# Patient Record
Sex: Male | Born: 1935 | Hispanic: Yes | Marital: Married | Smoking: Never smoker
Health system: Southern US, Community
[De-identification: ages and names within clinical notes are randomized; demographics above are authoritative.]

## PROBLEM LIST (undated history)

## (undated) DIAGNOSIS — G4700 Insomnia, unspecified: Secondary | ICD-10-CM

## (undated) DIAGNOSIS — C801 Malignant (primary) neoplasm, unspecified: Secondary | ICD-10-CM

## (undated) DIAGNOSIS — M1990 Unspecified osteoarthritis, unspecified site: Secondary | ICD-10-CM

## (undated) DIAGNOSIS — I10 Essential (primary) hypertension: Secondary | ICD-10-CM

## (undated) DIAGNOSIS — I714 Abdominal aortic aneurysm, without rupture, unspecified: Secondary | ICD-10-CM

## (undated) DIAGNOSIS — R0600 Dyspnea, unspecified: Secondary | ICD-10-CM

## (undated) DIAGNOSIS — D649 Anemia, unspecified: Secondary | ICD-10-CM

## (undated) DIAGNOSIS — J45909 Unspecified asthma, uncomplicated: Secondary | ICD-10-CM

## (undated) DIAGNOSIS — N189 Chronic kidney disease, unspecified: Secondary | ICD-10-CM

## (undated) DIAGNOSIS — J449 Chronic obstructive pulmonary disease, unspecified: Secondary | ICD-10-CM

## (undated) DIAGNOSIS — F419 Anxiety disorder, unspecified: Secondary | ICD-10-CM

## (undated) DIAGNOSIS — E119 Type 2 diabetes mellitus without complications: Secondary | ICD-10-CM

## (undated) DIAGNOSIS — F32A Depression, unspecified: Secondary | ICD-10-CM

## (undated) DIAGNOSIS — M549 Dorsalgia, unspecified: Secondary | ICD-10-CM

## (undated) HISTORY — PX: PROSTATE SURGERY: SHX751

## (undated) HISTORY — PX: CARDIAC SURGERY: SHX584

## (undated) HISTORY — DX: Abdominal aortic aneurysm, without rupture, unspecified: I71.40

## (undated) HISTORY — PX: BOWEL RESECTION: SHX1257

---

## 2021-04-24 ENCOUNTER — Emergency Department (HOSPITAL_COMMUNITY): Payer: Medicare Other

## 2021-04-24 ENCOUNTER — Emergency Department (HOSPITAL_COMMUNITY)
Admission: EM | Admit: 2021-04-24 | Discharge: 2021-04-25 | Disposition: A | Discharge status: Home / Self Care | Payer: Medicare Other | Attending: Emergency Medicine | Admitting: Emergency Medicine

## 2021-04-24 ENCOUNTER — Other Ambulatory Visit: Payer: Self-pay

## 2021-04-24 ENCOUNTER — Encounter (HOSPITAL_COMMUNITY): Payer: Self-pay | Admitting: Emergency Medicine

## 2021-04-24 DIAGNOSIS — W182XXA Fall in (into) shower or empty bathtub, initial encounter: Secondary | ICD-10-CM | POA: Diagnosis not present

## 2021-04-24 DIAGNOSIS — I10 Essential (primary) hypertension: Secondary | ICD-10-CM | POA: Diagnosis not present

## 2021-04-24 DIAGNOSIS — Y92002 Bathroom of unspecified non-institutional (private) residence single-family (private) house as the place of occurrence of the external cause: Secondary | ICD-10-CM | POA: Insufficient documentation

## 2021-04-24 DIAGNOSIS — E1165 Type 2 diabetes mellitus with hyperglycemia: Secondary | ICD-10-CM | POA: Insufficient documentation

## 2021-04-24 DIAGNOSIS — R55 Syncope and collapse: Secondary | ICD-10-CM | POA: Diagnosis not present

## 2021-04-24 DIAGNOSIS — M546 Pain in thoracic spine: Secondary | ICD-10-CM | POA: Insufficient documentation

## 2021-04-24 DIAGNOSIS — R739 Hyperglycemia, unspecified: Secondary | ICD-10-CM

## 2021-04-24 HISTORY — DX: Type 2 diabetes mellitus without complications: E11.9

## 2021-04-24 HISTORY — DX: Dorsalgia, unspecified: M54.9

## 2021-04-24 HISTORY — DX: Essential (primary) hypertension: I10

## 2021-04-24 LAB — URINALYSIS, ROUTINE W REFLEX MICROSCOPIC
Bacteria, UA: NONE SEEN
Bilirubin Urine: NEGATIVE
Glucose, UA: >=500 mg/dL — AB
Hgb urine dipstick: NEGATIVE
Ketones, ur: NEGATIVE mg/dL
Leukocytes,Ua: NEGATIVE
Nitrite: NEGATIVE
Protein, ur: NEGATIVE mg/dL
Specific Gravity, Urine: 1.023 (ref 1.005–1.030)
pH: 6 (ref 5.0–8.0)

## 2021-04-24 LAB — TROPONIN I (HIGH SENSITIVITY)
Troponin I (High Sensitivity): 15 ng/L (ref <18)
Troponin I (High Sensitivity): 14 ng/L (ref <18)

## 2021-04-24 LAB — CBC WITH DIFFERENTIAL/PLATELET
Abs Immature Granulocytes: 0.06 10*3/uL (ref 0.00–0.07)
Basophils Absolute: 0 10*3/uL (ref 0.0–0.1)
Basophils Relative: 0 %
Eosinophils Absolute: 0.1 10*3/uL (ref 0.0–0.5)
Eosinophils Relative: 2 %
HCT: 42.1 % (ref 39.0–52.0)
Hemoglobin: 13.8 g/dL (ref 13.0–17.0)
Immature Granulocytes: 1 %
Lymphocytes Relative: 18 %
Lymphs Abs: 1.4 10*3/uL (ref 0.7–4.0)
MCH: 28.2 pg (ref 26.0–34.0)
MCHC: 32.8 g/dL (ref 30.0–36.0)
MCV: 86.1 fL (ref 80.0–100.0)
Monocytes Absolute: 0.4 10*3/uL (ref 0.1–1.0)
Monocytes Relative: 5 %
Neutro Abs: 5.7 10*3/uL (ref 1.7–7.7)
Neutrophils Relative %: 74 %
Platelets: 231 10*3/uL (ref 150–400)
RBC: 4.89 MIL/uL (ref 4.22–5.81)
RDW: 13.2 % (ref 11.5–15.5)
WBC: 7.7 10*3/uL (ref 4.0–10.5)
nRBC: 0 % (ref 0.0–0.2)

## 2021-04-24 LAB — COMPREHENSIVE METABOLIC PANEL
ALT: 15 U/L (ref 0–44)
AST: 11 U/L — ABNORMAL LOW (ref 15–41)
Albumin: 3.2 g/dL — ABNORMAL LOW (ref 3.5–5.0)
Alkaline Phosphatase: 84 U/L (ref 38–126)
Anion gap: 9 (ref 5–15)
BUN: 27 mg/dL — ABNORMAL HIGH (ref 8–23)
CO2: 24 mmol/L (ref 22–32)
Calcium: 8.9 mg/dL (ref 8.9–10.3)
Chloride: 91 mmol/L — ABNORMAL LOW (ref 98–111)
Creatinine, Ser: 1.27 mg/dL — ABNORMAL HIGH (ref 0.61–1.24)
GFR, Estimated: 56 mL/min — ABNORMAL LOW (ref >60)
Glucose, Bld: 687 mg/dL (ref 70–99)
Potassium: 4.8 mmol/L (ref 3.5–5.1)
Sodium: 124 mmol/L — ABNORMAL LOW (ref 135–145)
Total Bilirubin: 1.2 mg/dL (ref 0.3–1.2)
Total Protein: 6.4 g/dL — ABNORMAL LOW (ref 6.5–8.1)

## 2021-04-24 LAB — CBG MONITORING, ED
Glucose-Capillary: 192 mg/dL — ABNORMAL HIGH (ref 70–99)
Glucose-Capillary: 285 mg/dL — ABNORMAL HIGH (ref 70–99)
Glucose-Capillary: 424 mg/dL — ABNORMAL HIGH (ref 70–99)
Glucose-Capillary: 486 mg/dL — ABNORMAL HIGH (ref 70–99)

## 2021-04-24 MED ORDER — SODIUM CHLORIDE 0.9 % IV BOLUS
1000.0000 mL | Freq: Once | INTRAVENOUS | Status: AC
  Administered 2021-04-24: 17:00:00 1000 mL via INTRAVENOUS

## 2021-04-24 MED ORDER — INSULIN ASPART 100 UNIT/ML IJ SOLN
10.0000 [IU] | Freq: Once | INTRAMUSCULAR | Status: AC
  Administered 2021-04-24: 17:00:00 10 [IU] via INTRAVENOUS

## 2021-04-24 MED ORDER — HYDROCODONE-ACETAMINOPHEN 5-325 MG PO TABS: 1.0000 | ORAL_TABLET | ORAL | Status: DC | PRN

## 2021-04-24 MED ORDER — SODIUM CHLORIDE 0.9 % IV BOLUS: 500.0000 mL | Freq: Once | INTRAVENOUS | Status: DC

## 2021-04-24 MED ORDER — ZOLPIDEM TARTRATE 5 MG PO TABS: 5.0000 mg | ORAL_TABLET | Freq: Every evening | ORAL | Status: DC | PRN

## 2021-04-24 MED ORDER — INSULIN ASPART 100 UNIT/ML IJ SOLN
0.0000 [IU] | INTRAMUSCULAR | Status: DC
  Administered 2021-04-24: 3 [IU] via SUBCUTANEOUS
  Administered 2021-04-25: 10:00:00 8 [IU] via SUBCUTANEOUS
  Administered 2021-04-25: 04:00:00 3 [IU] via SUBCUTANEOUS

## 2021-04-24 MED ORDER — INSULIN ASPART 100 UNIT/ML IJ SOLN
15.0000 [IU] | Freq: Once | INTRAMUSCULAR | Status: AC
  Administered 2021-04-24: 18:00:00 15 [IU] via SUBCUTANEOUS

## 2021-04-24 MED ORDER — INSULIN ASPART 100 UNIT/ML IJ SOLN: 10.0000 [IU] | Freq: Once | INTRAMUSCULAR | Status: DC

## 2021-04-24 NOTE — ED Triage Notes (Addendum)
Pt to triage via [facility].  Pt felt dizzy and had a syncopal episode while in the shower.  Hit his back on the shower.  Woke up on the bathroom floor.  C/o thoracic back pain.  EMS- IV- 20g R hand Fentanyl 100 mcg NS 500cc CBG read HIGH

## 2021-04-24 NOTE — ED Provider Notes (Signed)
Emergency Medicine Provider Triage Evaluation Note  Eddie Graham , a 86 y.o. male  was evaluated in triage.  Pt complains of fall onset last night.  Patient was taking shower when he fell and hit his back on the wall.  Has associated dizziness, lightheadedness, back pain. He has not tried medications for his symptoms.  He lives at home alone.  Denies hitting his head, LOC, nausea, vomiting, fever, chills. Has a history of diabetes however he has not been taking his diabetic medications as prescribed.  Review of Systems  Positive: Dizziness, lightheadedness, chest pain Negative: Hitting his head, LOC  Physical Exam  BP (!) 142/71 (BP Location: Left Arm)    Pulse 78    Temp 98.5 F (36.9 C) (Oral)    Resp 14    SpO2 91%  Gen:   Awake, no distress   Resp:  Normal effort  MSK:   Moves extremities without difficulty  Other:  No C, T, L, S spinal tenderness to palpation.   Medical Decision Making  Medically screening exam initiated at 12:43 PM.  Appropriate orders placed.  Eddie Graham was informed that the remainder of the evaluation will be completed by another provider, this initial triage assessment does not replace that evaluation, and the importance of remaining in the ED until their evaluation is complete.     Jamesia Linnen A, PA-C 04/24/21 1326    Wyvonnia Dusky, MD 04/24/21 1425

## 2021-04-24 NOTE — ED Provider Notes (Signed)
[facility] EMERGENCY DEPARTMENT Provider Note   CSN: [id number] Arrival date & time: 04/24/21  1232     History Chief Complaint  Patient presents with   Loss of Consciousness    Eddie Graham is a 86 y.o. male.  Patient presents to the emergency department after a syncopal episode.  Patient was in the shower when the episode occurred.  Patient thinks he fell up against a wall, complains of upper middle and left-sided back pain since the fall.  Patient noted to be hyperglycemic at arrival.  He reports that he is supposed to be taking a pill for his diabetes but he does not take it.      Past Medical History:  Diagnosis Date   Back pain    Diabetes mellitus without complication ([place])    Hypertension     There are no problems to display for this patient.        No family history on file.  Social History   Tobacco Use   Smoking status: Never   Smokeless tobacco: Never  Substance Use Topics   Alcohol use: Not Currently   Drug use: Not Currently    Home Medications Prior to Admission medications   Not on File    Allergies    Patient has no allergy information on record.  Review of Systems   Review of Systems  Musculoskeletal:  Positive for back pain.  Neurological:  Positive for syncope.  All other systems reviewed and are negative.  Physical Exam Updated Vital Signs BP (!) 104/58    Pulse 73    Temp 98.5 F (36.9 C) (Oral)    Resp 18    SpO2 94%   Physical Exam Vitals and nursing note reviewed.  Constitutional:      General: He is not in acute distress.    Appearance: Normal appearance. He is well-developed.  HENT:     Head: Normocephalic and atraumatic.     Right Ear: Hearing normal.     Left Ear: Hearing normal.     Nose: Nose normal.  Eyes:     Conjunctiva/sclera: Conjunctivae normal.     Pupils: Pupils are equal, round, and reactive to light.  Cardiovascular:     Rate and Rhythm: Regular rhythm.     Heart  sounds: S1 normal and S2 normal. No murmur heard.   No friction rub. No gallop.  Pulmonary:     Effort: Pulmonary effort is normal. No respiratory distress.     Breath sounds: Normal breath sounds.  Chest:     Chest wall: No tenderness.  Abdominal:     General: Bowel sounds are normal.     Palpations: Abdomen is soft.     Tenderness: There is no abdominal tenderness. There is no guarding or rebound. Negative signs include Murphy's sign and McBurney's sign.     Hernia: No hernia is present.  Musculoskeletal:        General: Normal range of motion.     Cervical back: Normal range of motion and neck supple.     Thoracic back: Tenderness present.       Back:  Skin:    General: Skin is warm and dry.     Findings: No rash.  Neurological:     Mental Status: He is alert and oriented to person, place, and time.     GCS: GCS eye subscore is 4. GCS verbal subscore is 5. GCS motor subscore is 6.     Cranial Nerves:  No cranial nerve deficit.     Sensory: No sensory deficit.     Coordination: Coordination normal.  Psychiatric:        Speech: Speech normal.        Behavior: Behavior normal.        Thought Content: Thought content normal.    ED Results / Procedures / Treatments   Labs (all labs ordered are listed, but only abnormal results are displayed) Labs Reviewed  COMPREHENSIVE METABOLIC PANEL - Abnormal; Notable for the following components:      Result Value   Sodium 124 (*)    Chloride 91 (*)    Glucose, Bld 687 (*)    BUN 27 (*)    Creatinine, Ser 1.27 (*)    Total Protein 6.4 (*)    Albumin 3.2 (*)    AST 11 (*)    GFR, Estimated 56 (*)    All other components within normal limits  URINALYSIS, ROUTINE W REFLEX MICROSCOPIC - Abnormal; Notable for the following components:   Color, Urine STRAW (*)    Glucose, UA >=500 (*)    All other components within normal limits  CBG MONITORING, ED - Abnormal; Notable for the following components:   Glucose-Capillary 486 (*)    All  other components within normal limits  CBG MONITORING, ED - Abnormal; Notable for the following components:   Glucose-Capillary 424 (*)    All other components within normal limits  CBG MONITORING, ED - Abnormal; Notable for the following components:   Glucose-Capillary 285 (*)    All other components within normal limits  CBC WITH DIFFERENTIAL/PLATELET  TROPONIN I (HIGH SENSITIVITY)  TROPONIN I (HIGH SENSITIVITY)    EKG EKG Interpretation  Date/Time:  Thursday April 24 2021 12:44:03 EST Ventricular Rate:  78 PR Interval:  146 QRS Duration: 78 QT Interval:  390 QTC Calculation: 444 R Axis:   -52 Text Interpretation: Normal sinus rhythm Left axis deviation Anterior infarct , age undetermined ST & T wave abnormality, consider inferior ischemia Abnormal ECG No previous ECGs available Confirmed by Orpah Greek [phone]) on 04/24/2021 8:56:40 PM  Radiology DG Chest 2 View  Result Date: 04/24/2021 CLINICAL DATA:  Chest pain, dizziness, syncope EXAM: CHEST - 2 VIEW COMPARISON:  None FINDINGS: Cardiac size is within normal limits. Thoracic aorta is tortuous and ectatic. There are no focal pulmonary infiltrates. There are no signs of alveolar pulmonary edema. Subtle increase in interstitial markings are noted near the right costophrenic angle. There is no significant pleural effusion or pneumothorax. Metallic sutures seen in the sternum. Deformities seen in few right lower ribs suggesting old fractures. IMPRESSION: There are no signs of pulmonary edema or focal pulmonary consolidation. Subtle increase in interstitial markings near the right lateral costophrenic angle may suggest minimal scarring. There is no pleural effusion or pneumothorax. Electronically Signed   By: Elmer Picker M.D.   On: 04/24/2021 13:31    Procedures Procedures   Medications Ordered in ED Medications  sodium chloride 0.9 % bolus 500 mL (500 mLs Intravenous Not Given 04/24/21 2011)  insulin aspart  (novoLOG) injection 10 Units (10 Units Subcutaneous Not Given 04/24/21 2011)  HYDROcodone-acetaminophen (NORCO/VICODIN) 5-325 MG per tablet 1 tablet (has no administration in time range)  zolpidem (AMBIEN) tablet 5 mg (has no administration in time range)  insulin aspart (novoLOG) injection 0-15 Units (has no administration in time range)  sodium chloride 0.9 % bolus 1,000 mL (0 mLs Intravenous Stopped 04/24/21 1830)  insulin aspart (novoLOG) injection 10  Units (10 Units Intravenous Given 04/24/21 1630)  insulin aspart (novoLOG) injection 15 Units (15 Units Subcutaneous Given 04/24/21 1732)    ED Course  I have reviewed the triage vital signs and the nursing notes.  Pertinent labs & imaging results that were available during my care of the patient were reviewed by me and considered in my medical decision making (see chart for details).    MDM Rules/Calculators/A&P                         Patient presents to the emergency department for evaluation after syncopal episode.  Patient fell while in the shower, hit his upper back on the wall.  Patient complaining of posterior rib pain.  X-ray of chest does not show any abnormality.  He is awake, alert and oriented.  No evidence of head injury.  Patient found to be very hyperglycemic without evidence of DKA.  He is a diabetic but indicates that he has not been taking his medications.  Patient given IV fluids and insulin with correction of his blood sugar.  Discussed with his daughter, Eddie Graham who lives in [place].  She has been unable to get down here to help him because of weather in the [place].  He is currently living alone because his wife had to go to [place] to go to rehab after prolonged illness.  Patient likely not caring for himself well.  Daughter has been trying to get home health through the primary care doctor while he is alone, but has not been able to.  She does indicate that he has some forgetfulness and possible dementia, is not  a candidate to be discharged at this time.  Will require transition of care team to evaluate for possible home health needs.     Final Clinical Impression(s) / ED Diagnoses Final diagnoses:  Hyperglycemia    Rx / DC Orders ED Discharge Orders     None        Orpah Greek, MD 04/24/21 2245

## 2021-04-25 DIAGNOSIS — R55 Syncope and collapse: Secondary | ICD-10-CM | POA: Diagnosis not present

## 2021-04-25 LAB — CBG MONITORING, ED
Glucose-Capillary: 168 mg/dL — ABNORMAL HIGH (ref 70–99)
Glucose-Capillary: 260 mg/dL — ABNORMAL HIGH (ref 70–99)

## 2021-04-25 NOTE — ED Provider Notes (Signed)
Patient was seen by Dr. Betsey Holiday last evening.  Patient was holding in the ED waiting for possible home health and family assistance.  Family is here to take the patient home.  Case management has been involved in his assisting with home health.  Patient's blood sugar has improved with treatment   Dorie Rank, MD 04/25/21 1144

## 2021-04-25 NOTE — Progress Notes (Signed)
Inpatient Diabetes Program Recommendations  AACE/ADA: New Consensus Statement on Inpatient Glycemic Control (2015)  Target Ranges:  Prepandial:   less than 140 mg/dL      Peak postprandial:   less than 180 mg/dL (1-2 hours)      Critically ill patients:  140 - 180 mg/dL   Lab Results  Component Value Date   GLUCAP 260 (H) 04/25/2021    Review of Glycemic Control  Latest Reference Range & Units 04/24/21 18:32 04/24/21 20:09 04/24/21 23:42 04/25/21 03:48 04/25/21 09:26  Glucose-Capillary 70 - 99 mg/dL 424 (H) 285 (H) 192 (H) 168 (H) 260 (H)  (H): Data is abnormally high Diabetes history: Type 2 DM Outpatient Diabetes medications: Rybelsus 7 mg QD (NT), Glipizide 10 mg BID (NT) Current orders for Inpatient glycemic control: Novolog 0-15 units Q4H, Novolog 10- units x 1  Inpatient Diabetes Program Recommendations:    Consider adding Levemir 8 units QD.   Thanks, Bronson Curb, MSN, RNC-OB Diabetes Coordinator [phone] (8a-5p)

## 2021-04-25 NOTE — Discharge Planning (Signed)
Cheo Selvey J. Clydene Laming, RN, BSN, [place] [phone] RNCM consulted regarding discharge planning for Eddie Graham. Offered pt medicare.gov list of home health agencies to choose from.  Pt chose [facility] to render services. RNCM placed referral and is awaiting confirmation of referral acceptance. Patient made aware that [facility] will be in contact in 24-48 hours.  No DME needs identified at this time.

## 2021-04-25 NOTE — ED Notes (Signed)
Pt's daughter sent family friend to pick up pt. Pt discharged home with friend and home health to be set up. Pt informed of need to follow-up with PCP regarding diabetes.

## 2021-07-10 ENCOUNTER — Emergency Department (HOSPITAL_COMMUNITY): Payer: Medicare Other

## 2021-07-10 ENCOUNTER — Emergency Department (HOSPITAL_COMMUNITY)
Admission: EM | Admit: 2021-07-10 | Discharge: 2021-07-10 | Disposition: A | Discharge status: Home / Self Care | Payer: Medicare Other | Attending: Emergency Medicine | Admitting: Emergency Medicine

## 2021-07-10 ENCOUNTER — Encounter (HOSPITAL_COMMUNITY): Payer: Self-pay | Admitting: Oncology

## 2021-07-10 ENCOUNTER — Other Ambulatory Visit: Payer: Self-pay

## 2021-07-10 DIAGNOSIS — S0990XA Unspecified injury of head, initial encounter: Secondary | ICD-10-CM | POA: Diagnosis present

## 2021-07-10 DIAGNOSIS — Y92009 Unspecified place in unspecified non-institutional (private) residence as the place of occurrence of the external cause: Secondary | ICD-10-CM | POA: Insufficient documentation

## 2021-07-10 DIAGNOSIS — N329 Bladder disorder, unspecified: Secondary | ICD-10-CM | POA: Insufficient documentation

## 2021-07-10 DIAGNOSIS — I7143 Infrarenal abdominal aortic aneurysm, without rupture: Secondary | ICD-10-CM

## 2021-07-10 DIAGNOSIS — I10 Essential (primary) hypertension: Secondary | ICD-10-CM | POA: Diagnosis not present

## 2021-07-10 DIAGNOSIS — W19XXXA Unspecified fall, initial encounter: Secondary | ICD-10-CM

## 2021-07-10 DIAGNOSIS — E119 Type 2 diabetes mellitus without complications: Secondary | ICD-10-CM | POA: Diagnosis not present

## 2021-07-10 DIAGNOSIS — M25511 Pain in right shoulder: Secondary | ICD-10-CM

## 2021-07-10 DIAGNOSIS — N3289 Other specified disorders of bladder: Secondary | ICD-10-CM

## 2021-07-10 DIAGNOSIS — S3991XA Unspecified injury of abdomen, initial encounter: Secondary | ICD-10-CM | POA: Insufficient documentation

## 2021-07-10 LAB — COMPREHENSIVE METABOLIC PANEL
ALT: 16 U/L (ref 0–44)
AST: 16 U/L (ref 15–41)
Albumin: 3.5 g/dL (ref 3.5–5.0)
Alkaline Phosphatase: 63 U/L (ref 38–126)
Anion gap: 10 (ref 5–15)
BUN: 42 mg/dL — ABNORMAL HIGH (ref 8–23)
CO2: 27 mmol/L (ref 22–32)
Calcium: 8.8 mg/dL — ABNORMAL LOW (ref 8.9–10.3)
Chloride: 94 mmol/L — ABNORMAL LOW (ref 98–111)
Creatinine, Ser: 1.48 mg/dL — ABNORMAL HIGH (ref 0.61–1.24)
GFR, Estimated: 46 mL/min — ABNORMAL LOW (ref >60)
Glucose, Bld: 324 mg/dL — ABNORMAL HIGH (ref 70–99)
Potassium: 4.6 mmol/L (ref 3.5–5.1)
Sodium: 131 mmol/L — ABNORMAL LOW (ref 135–145)
Total Bilirubin: 0.7 mg/dL (ref 0.3–1.2)
Total Protein: 7.5 g/dL (ref 6.5–8.1)

## 2021-07-10 LAB — URINALYSIS, ROUTINE W REFLEX MICROSCOPIC
Bacteria, UA: NONE SEEN
Bilirubin Urine: NEGATIVE
Glucose, UA: >=500 mg/dL — AB
Hgb urine dipstick: NEGATIVE
Ketones, ur: NEGATIVE mg/dL
Nitrite: NEGATIVE
Protein, ur: NEGATIVE mg/dL
Specific Gravity, Urine: 1.015 (ref 1.005–1.030)
pH: 5 (ref 5.0–8.0)

## 2021-07-10 LAB — CBC WITH DIFFERENTIAL/PLATELET
Abs Immature Granulocytes: 0.04 10*3/uL (ref 0.00–0.07)
Basophils Absolute: 0 10*3/uL (ref 0.0–0.1)
Basophils Relative: 0 %
Eosinophils Absolute: 0.2 10*3/uL (ref 0.0–0.5)
Eosinophils Relative: 2 %
HCT: 47.1 % (ref 39.0–52.0)
Hemoglobin: 15.1 g/dL (ref 13.0–17.0)
Immature Granulocytes: 0 %
Lymphocytes Relative: 23 %
Lymphs Abs: 2.3 10*3/uL (ref 0.7–4.0)
MCH: 27.8 pg (ref 26.0–34.0)
MCHC: 32.1 g/dL (ref 30.0–36.0)
MCV: 86.7 fL (ref 80.0–100.0)
Monocytes Absolute: 0.6 10*3/uL (ref 0.1–1.0)
Monocytes Relative: 6 %
Neutro Abs: 6.8 10*3/uL (ref 1.7–7.7)
Neutrophils Relative %: 69 %
Platelets: 266 10*3/uL (ref 150–400)
RBC: 5.43 MIL/uL (ref 4.22–5.81)
RDW: 13.9 % (ref 11.5–15.5)
WBC: 10 10*3/uL (ref 4.0–10.5)
nRBC: 0 % (ref 0.0–0.2)

## 2021-07-10 MED ORDER — INSULIN ASPART 100 UNIT/ML IJ SOLN
8.0000 [IU] | Freq: Once | INTRAMUSCULAR | Status: AC
  Administered 2021-07-10: 8 [IU] via INTRAVENOUS
  Filled 2021-07-10: qty 0.08

## 2021-07-10 MED ORDER — ACETAMINOPHEN 500 MG PO TABS
1000.0000 mg | ORAL_TABLET | Freq: Once | ORAL | Status: AC
  Administered 2021-07-10: 1000 mg via ORAL
  Filled 2021-07-10: qty 2

## 2021-07-10 MED ORDER — SODIUM CHLORIDE 0.9 % IV BOLUS
500.0000 mL | Freq: Once | INTRAVENOUS | Status: AC
  Administered 2021-07-10: 500 mL via INTRAVENOUS

## 2021-07-10 NOTE — ED Provider Notes (Signed)
? ?Emergency Department Provider Note ? ? ?I have reviewed the triage vital signs and the nursing notes. ? ? ?HISTORY ? ?Chief Complaint ?Fall ? ?Patient declined Spanish interpreter ? ?HPI ?Eddie Graham is a 87 y.o. male with PMH of HTN, DM, and back pain presents to the emergency department evaluation after 2 falls at home today.  Patient tells me that the first fall was related to him slipping on a rug.  Patient does not he was able to get up and does not recall head injury or loss of consciousness.  No presyncope symptoms.  Patient notes that he had a second fall short time after after feeling a bit more weak today.  That caused him to fall on his right side and is having some pain in the right shoulder.  No unilateral weakness or numbness.  No vision or speech change.  No prodrome or presyncope. Patient arrives by EMS. They report elevated CBG with them.  ?  ? ?Past Medical History:  ?Diagnosis Date  ? Back pain   ? Diabetes mellitus without complication ([place])   ? Hypertension   ? ? ?Review of Systems ? ?Constitutional: No fever/chills ?Cardiovascular: Denies chest pain. ?Respiratory: Denies shortness of breath. ?Gastrointestinal: Mild abdominal pain.  No nausea, no vomiting.  No diarrhea.  No constipation. ?Genitourinary: Negative for dysuria. ?Musculoskeletal: Positive lower back and right shoulder pain.  ?Skin: Negative for rash. ?Neurological: Negative for headaches, focal weakness or numbness. ? ? ?____________________________________________ ? ? ?PHYSICAL EXAM: ? ?VITAL SIGNS: ?ED Triage Vitals  ?Enc Vitals Group  ?   BP 07/10/21 1108 136/65  ?   Pulse Rate 07/10/21 1108 79  ?   Resp 07/10/21 1108 16  ?   Temp 07/10/21 1108 97.7 ?F (36.5 ?C)  ?   Temp src --   ?   SpO2 07/10/21 1105 96 %  ? ?Constitutional: Alert and oriented. Well appearing and in no acute distress. ?Eyes: Conjunctivae are normal.  ?Head: Atraumatic. ?Nose: No congestion/rhinnorhea. ?Mouth/Throat: Mucous membranes are  moist.  ?Neck: No stridor. No cervical spine tenderness to palpation. ?Cardiovascular: Normal rate, regular rhythm. Good peripheral circulation. Grossly normal heart sounds.   ?Respiratory: Normal respiratory effort.  No retractions. Lungs CTAB. ?Gastrointestinal: Soft and nontender. No distention.  ?Musculoskeletal: No lower extremity tenderness nor edema. No gross deformities of extremities. Normal ROM of the right shoulder, elbow and wrist. No scaphoid tenderness. Normal ROM of the bilateral LEs.  ?Neurologic:  Normal speech and language. No gross focal neurologic deficits are appreciated.  ?Skin:  Skin is warm, dry and intact. No rash noted. ? ? ?____________________________________________ ?  ?LABS ?(all labs ordered are listed, but only abnormal results are displayed) ? ?Labs Reviewed  ?COMPREHENSIVE METABOLIC PANEL - Abnormal; Notable for the following components:  ?    Result Value  ? Sodium 131 (*)   ? Chloride 94 (*)   ? Glucose, Bld 324 (*)   ? BUN 42 (*)   ? Creatinine, Ser 1.48 (*)   ? Calcium 8.8 (*)   ? GFR, Estimated 46 (*)   ? All other components within normal limits  ?URINALYSIS, ROUTINE W REFLEX MICROSCOPIC - Abnormal; Notable for the following components:  ? Glucose, UA >=500 (*)   ? Leukocytes,Ua TRACE (*)   ? All other components within normal limits  ?CBC WITH DIFFERENTIAL/PLATELET  ? ? ?____________________________________________ ? ? ?PROCEDURES ? ?Procedure(s) performed:  ? ?Procedures ? ?None  ?____________________________________________ ? ? ?INITIAL IMPRESSION / ASSESSMENT AND PLAN /  ED COURSE ? ?Pertinent labs & imaging results that were available during my care of the patient were reviewed by me and considered in my medical decision making (see chart for details). ?  ?This patient is Presenting for Evaluation of fall, which does require a range of treatment options, and is a complaint that involves a high risk of morbidity and mortality. ? ?The Differential Diagnoses includes hip  fracture, dislocation, head injury with bleeding, right shoulder dislocation/fracture, hyperglycemia/DKA leading to weakness. ? ?Critical Interventions-  ?  ?Medications  ?sodium chloride 0.9 % bolus 500 mL (0 mLs Intravenous Stopped 07/10/21 1434)  ?acetaminophen (TYLENOL) tablet 1,000 mg (1,000 mg Oral Given 07/10/21 1319)  ?insulin aspart (novoLOG) injection 8 Units (8 Units Intravenous Given 07/10/21 1443)  ? ? ?Reassessment after intervention:  Feeling well and ambulatory in the ED.  ? ? ?I did obtain Additional Historical Information from EMS. No pain medication given PTA. Spoke with patient's daughter, [name]. Patient lives alone. Family is trying to arrange for the patient to move back to [place].  ? ?  ?Clinical Laboratory Tests Ordered, included hyperglycemia without DKA. No leukocytosis.  ? ?Radiologic Tests Ordered, included CT head, c-spine, and abdomen/pelvis along with plain films of chest, and shoulder. I independently interpreted the images and agree with radiology interpretation.  ? ?Cardiac Monitor Tracing which shows NSR ? ? ?Social Determinants of Health Risk patient is a non-smoker.  ? ? ?Medical Decision Making: Summary:  ?Patient presents emergency department for evaluation after fall.  He is describing some generalized weakness today with hyperglycemia.  Will need labs to rule out DKA or other metabolic issue.  Patient's neuro exam is intact.  Do not find evidence of focal neurodeficit to suspect stroke.  No outward sign of head injury.  Given multiple falls, however, do plan for imaging including of the head, cervical spine, abdomen along with plain films.  ? ?Reevaluation with update and discussion with patient and daughter by phone. Case mgmt to assist with [place] orders. Plan for discharge. Patient ambulatory in the ED. CT findings discussed with patient and daughter. Follow up contact info given.  ? ?Disposition: discharge ? ?____________________________________________ ? ?FINAL CLINICAL  IMPRESSION(S) / ED DIAGNOSES ? ?Final diagnoses:  ?Fall, initial encounter  ?Acute pain of right shoulder  ?Bladder mass  ?Infrarenal abdominal aortic aneurysm (AAA) without rupture  ? ? ? ? ?Note:  This document was prepared using Dragon voice recognition software and may include unintentional dictation errors. ? ?Nanda Quinton, MD, FACEP ?Emergency Medicine ? ?  ?Margette Fast, MD ?07/14/21 1130 ? ?

## 2021-07-10 NOTE — Progress Notes (Signed)
.  Transition of Care [facility]) - Emergency Department Mini Assessment ? ? ?Patient Details  ?Name: Eddie Graham ?MRN: [id number] ?Date of Birth: 06/30/1934 ? ?Transition of Care (TOC) CM/SW Contact:    ?Arlie Solomons Madilynne Mullan, LCSW ?Phone Number: ?07/10/2021, 3:25 PM ? ? ?Clinical Narrative: ?CSW spoke with pt's daughter [name], she stated pt is set up with [facility] service. She stated she has been working to get pt back to [place]. Per pt's daughter pt's [facility] nurse will come back to the home on Monday to check on his medication. Per pt's daughter, her main concern was transportation back home. CSW informed pt's daughter the hospital can provide transportation upon d/c. CSW to contact Safe transport for [employer]. ?CSW attempted to contact Safe transport and left VM requesting a return call. Toc to follow.  ? ? ? ?ED Mini Assessment: ?What brought you to the Emergency Department? : fall ? ?Barriers to Discharge: Continued Medical Work up ? ?  ? ?Means of departure: Hospital Transport ? ?Interventions which prevented an admission or readmission: Transportation Screening ? ? ? ?Patient Contact and Communications ?  ?  ?  ? ,     ?  ?  ? ?  ?CMS Medicare.gov Compare Post Acute Care list provided to:: Patient ?  ? ?Admission diagnosis:  Fall ?There are no problems to display for this patient. ? ?PCP:  Default, Provider, MD ?Pharmacy:   ?[facility], [address] ?[place] [address] ?Phone: [phone] Fax: [phone] ?  ?

## 2021-07-10 NOTE — ED Triage Notes (Signed)
Pt bib [facility] from home s/p fall.  Pt had 2 falls PTA. 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  Pt did strike right shoulder on table w/ second fall.  Pt denies hitting head, LOC.  Pt had elevated CBG for EMS of 460 states he had taken his insulin and had just ate.   ?

## 2021-07-10 NOTE — ED Notes (Signed)
Pt ambulated well to bathroom and down the hallway. ?

## 2021-07-10 NOTE — Discharge Instructions (Signed)
You were seen in the emergency room today after a fall.  Your CT scans showed the findings as we discussed.  Please follow with the urology and vascular surgery team.  I have listed their contact information above and discussed this with your daughter.  Our case manager will be trying to arrange home health.  Please follow closely with your primary care doctor. ?

## 2021-07-10 NOTE — Progress Notes (Signed)
Safe transport was set up for door to door pick up. ETA 50 mins.  ? ?Arlie Solomons.Graylin Sperling, MSW, [place] ?[place]  Transitions of Care ?Clinical Social Worker I ?Direct Dial: [phone]  Fax: [phone] ?[email or web address]   ?

## 2021-08-05 ENCOUNTER — Encounter: Payer: Self-pay | Admitting: Vascular Surgery

## 2021-08-05 ENCOUNTER — Ambulatory Visit (INDEPENDENT_AMBULATORY_CARE_PROVIDER_SITE_OTHER): Payer: Medicare Other | Admitting: Vascular Surgery

## 2021-08-05 DIAGNOSIS — I714 Abdominal aortic aneurysm, without rupture, unspecified: Secondary | ICD-10-CM | POA: Insufficient documentation

## 2021-08-05 DIAGNOSIS — I7143 Infrarenal abdominal aortic aneurysm, without rupture: Secondary | ICD-10-CM

## 2021-08-05 NOTE — Progress Notes (Signed)
? ? ?Patient name: Eddie Graham MRN: [id number] DOB: 12/24/1934 Sex: male ? ?REASON FOR CONSULT: Abdominal aortic aneurysm ? ?HPI: ?Eddie Graham is a 87 y.o. male, with history of hypertension, diabetes, chronic back pain that presents for evaluation of abdominal aortic aneurysm.  He was seen in the ED following a fall at home and had a CT on 07/10/2021.  This demonstrated a 3.2 cm saccular aneurysm on the right side of the distal abdominal aorta per the radiology report.  He states he had no prior knowledge of this aneurysm.  He has chronic back pain but no abdominal pain.  He does smoke daily.  Lives independently.  Previous abdominal surgery includes prostate surgery. ? ?Past Medical History:  ?Diagnosis Date  ? AAA (abdominal aortic aneurysm) ([place])   ? Back pain   ? Diabetes mellitus without complication ([place])   ? Hypertension   ? ? ?Past Surgical History:  ?Procedure Laterality Date  ? CARDIAC SURGERY    ? PROSTATE SURGERY    ? ? ?History reviewed. No pertinent family history. ? ?SOCIAL HISTORY: ?Social History  ? ?Socioeconomic History  ? Marital status: Married  ?  Spouse name: Not on file  ? Number of children: Not on file  ? Years of education: Not on file  ? Highest education level: Not on file  ?Occupational History  ? Not on file  ?Tobacco Use  ? Smoking status: Never  ? Smokeless tobacco: Never  ?Vaping Use  ? Vaping Use: Never used  ?Substance and Sexual Activity  ? Alcohol use: Not Currently  ? Drug use: Not Currently  ? Sexual activity: Not on file  ?Other Topics Concern  ? Not on file  ?Social History Narrative  ? Not on file  ? ?Social Determinants of Health  ? ?Financial Resource Strain: Not on file  ?Food Insecurity: Not on file  ?Transportation Needs: Not on file  ?Physical Activity: Not on file  ?Stress: Not on file  ?Social Connections: Not on file  ?Intimate Partner Violence: Not on file  ? ? ?No Known Allergies ? ?Current Outpatient Medications  ?Medication Sig  Dispense Refill  ? albuterol (VENTOLIN HFA) 108 (90 Base) MCG/ACT inhaler Inhale 2 puffs into the lungs every 4 (four) hours as needed for wheezing.    ? clopidogrel (PLAVIX) 75 MG tablet Take 75 mg by mouth daily.    ? DULoxetine (CYMBALTA) 60 MG capsule Take 60 mg by mouth daily.    ? gabapentin (NEURONTIN) 100 MG capsule Take 100 mg by mouth 3 (three) times daily.    ? glipiZIDE (GLUCOTROL) 10 MG tablet Take 10 mg by mouth 2 (two) times daily.    ? metoprolol succinate (TOPROL-XL) 25 MG 24 hr tablet Take 25 mg by mouth daily.    ? montelukast (SINGULAIR) 10 MG tablet Take 10 mg by mouth daily.    ? pantoprazole (PROTONIX) 40 MG tablet Take 40 mg by mouth daily.    ? rosuvastatin (CRESTOR) 40 MG tablet Take 40 mg by mouth daily.    ? RYBELSUS 7 MG TABS Take 7 mg by mouth daily.    ? traZODone (DESYREL) 50 MG tablet Take 50 mg by mouth at bedtime.    ? valsartan (DIOVAN) 80 MG tablet Take 80 mg by mouth daily.    ? ?No current facility-administered medications for this visit.  ? ? ?REVIEW OF SYSTEMS:  ?'[X]'$  denotes positive finding, '[ ]'$  denotes negative finding ?Cardiac  Comments:  ?Chest pain or chest pressure:    ?  Shortness of breath upon exertion:    ?Short of breath when lying flat:    ?Irregular heart rhythm:    ?    ?Vascular    ?Pain in calf, thigh, or hip brought on by ambulation:    ?Pain in feet at night that wakes you up from your sleep:     ?Blood clot in your veins:    ?Leg swelling:     ?    ?Pulmonary    ?Oxygen at home:    ?Productive cough:     ?Wheezing:     ?    ?Neurologic    ?Sudden weakness in arms or legs:     ?Sudden numbness in arms or legs:     ?Sudden onset of difficulty speaking or slurred speech:    ?Temporary loss of vision in one eye:     ?Problems with dizziness:     ?    ?Gastrointestinal    ?Blood in stool:     ?Vomited blood:     ?    ?Genitourinary    ?Burning when urinating:     ?Blood in urine:    ?    ?Psychiatric    ?Major depression:     ?    ?Hematologic    ?Bleeding  problems:    ?Problems with blood clotting too easily:    ?    ?Skin    ?Rashes or ulcers:    ?    ?Constitutional    ?Fever or chills:    ? ? ?PHYSICAL EXAM: ?Vitals:  ? 08/05/21 0852  ?BP: 122/76  ?Pulse: 78  ?Resp: 20  ?Temp: 98.4 ?F (36.9 ?C)  ?SpO2: 94%  ?Weight: 177 lb (80.3 kg)  ?Height: '5\' 6"'$  (1.676 m)  ? ? ?GENERAL: The patient is a well-nourished male, in no acute distress. The vital signs are documented above. ?CARDIAC: There is a regular rate and rhythm.  ?VASCULAR:  ?Palpable femoral pulses bilaterally ?Palpable PT pulses bilaterally ?PULMONARY: No respiratory distress ?ABDOMEN: Soft and non-tender.  No pain with deep palpation. ?MUSCULOSKELETAL: There are no major deformities or cyanosis. ?NEUROLOGIC: No focal weakness or paresthesias are detected. ?SKIN: There are no ulcers or rashes noted. ?PSYCHIATRIC: The patient has a normal affect. ? ?DATA:  ? ?CT reviewed from 07/10/21 abdomen pelvis without contrast. ? ? ? ? ?Assessment/Plan: ? ?87 year old male presents for evaluation of abdominal aortic aneurysm that was discovered incidentally following a CT in the [facility] after he presented with blunt trauma from a fall.  Discussed that his CT scan shows this saccular aneurysm off the right side of the abdominal aorta measuring a maximal diameter of about 3.4 cm and his native aorta measures about 1.5 cm adjacent to the aneurysm.  I did discuss that saccular aneurysms have a higher risk morphology for rupture compared to traditional fusiform aneurysms.  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 I have recommended a CTA abdomen pelvis for further evaluation of options to see if we can potentially do a cuff here and also to evaluate his iliac access given he has fairly calcified iliacs.  Discussed the other option would be continued surveillance.  I will see him back once CTA is complete.  All questions answered. ? ? ?Marty Heck, MD ?[facility] ?Office: [phone] ? ? ? ? ?

## 2021-08-18 ENCOUNTER — Other Ambulatory Visit: Payer: Self-pay

## 2021-08-18 DIAGNOSIS — I7143 Infrarenal abdominal aortic aneurysm, without rupture: Secondary | ICD-10-CM

## 2021-08-25 ENCOUNTER — Inpatient Hospital Stay: Admission: RE | Admit: 2021-08-25 | Payer: Medicaid Other | Source: Ambulatory Visit

## 2021-08-27 ENCOUNTER — Other Ambulatory Visit: Payer: Self-pay

## 2021-08-27 DIAGNOSIS — I7143 Infrarenal abdominal aortic aneurysm, without rupture: Secondary | ICD-10-CM

## 2021-08-29 ENCOUNTER — Telehealth: Payer: Self-pay | Admitting: Licensed Clinical Social Worker

## 2021-08-29 NOTE — Progress Notes (Signed)
?Heart and Vascular Care Navigation ? ?08/29/2021 ? ?Eddie Graham ?[birth year] ?[id number] ? ?Reason for Referral:  ?Engaged with patient by telephone for initial visit for Heart and Vascular Care Coordination. ?                                                                                                  ?Assessment:        ?LCSW called pt with the assistance of [facility] [employer] [phone]. Introduced self, role, reason for call. Pt lives in [place] but his daughters do not, he indicates [place] lives in [place]. He states a family member lives nearby but then didn't elaborate- he has had challenges with transportation. LCSW shared that pt has a ride arranged for Monday to get to his appt at [facility]. He was told what time it may arrive. LCSW encourage him to be ready by 8am and keep his eyes out. When we started to discuss further transportation benefits pt had a hard time understanding what we were asking of him. I inquired if I could speak with his daughter Eddie Graham. He gives verbal approval.  ? ?LCSW called pt daughter at [phone], left voicemail for her which was returned. Pt daughter speaks [place], I introduced myself, role, reason for call. Pt daughter shares that pt has two daughters and nobody lives in [place] but pt has lived down here for about 2 decades. Pt daughter explained the situation with transportation and upcoming appointments. I verbally shared that pt eligible for rides through [facility] and [facility] Medicare. Pt daughter requests I e-mail those to her, which I have to [email or web address]. Pt daughter will assist with future transportation- she is aware of time for cab on Monday for pt to get to [facility].  ? ? ?HRT/VAS Care Coordination   ? ? Patients Home Cardiology Office [facility]  ? Outpatient Care Team Social Worker  ? Social Worker Name: Eddie Graham [phone]  ? Living arrangements for the past 2 months Apartment  ? Lives with: Self  ? Patient Current  Therapist, music Medicare; Medicaid  ? Patient Has Concern With Paying Medical Bills No  ? Does Patient Have Prescription Coverage? Yes  ? ?  ? ? ?Social History:                                                                             ?SDOH Screenings  ? ?Alcohol Screen: Not on file  ?Depression (PHQ2-9): Not on file  ?Financial Resource Strain: Low Risk   ? Difficulty of Paying Living Expenses: Not very hard  ?Food Insecurity: No Food Insecurity  ? Worried About Charity fundraiser in the Last Year: Never true  ? Ran Out of Food in the Last Year: Never true  ?Housing: Low Risk   ? Last Housing Risk  Score: 0  ?Physical Activity: Not on file  ?Social Connections: Not on file  ?Stress: Not on file  ?Tobacco Use: Low Risk   ? Smoking Tobacco Use: Never  ? Smokeless Tobacco Use: Never  ? Passive Exposure: Not on file  ?Transportation Needs: Unmet Transportation Needs  ? Lack of Transportation (Medical): Yes  ? Lack of Transportation (Non-Medical): Yes  ? ? ?SDOH Interventions: ?Financial Resources:  Financial Strain Interventions: Intervention Not Indicated ?  ?Housing Insecurity:  Housing Interventions: Intervention Not Indicated  ?Transportation:   Transportation Interventions: Payor Benefit  ? ? ? ?Follow-up plan:   ?LCSW has sent transportation both via mail (in [place]) to pt and via email to pt daughter. Both have my number for questions/concerns. I have spoke with Eddie Graham in scheduling at [facility] to let him know this as well. I remain available as needed, will not actively follow at this time.  ? ? ? ?

## 2021-09-01 ENCOUNTER — Emergency Department (HOSPITAL_COMMUNITY)
Admission: RE | Admit: 2021-09-01 | Discharge: 2021-09-01 | Disposition: A | Discharge status: Home / Self Care | Payer: Medicare Other | Source: Ambulatory Visit | Attending: Vascular Surgery | Admitting: Vascular Surgery

## 2021-09-01 ENCOUNTER — Encounter (HOSPITAL_COMMUNITY): Payer: Self-pay | Admitting: Emergency Medicine

## 2021-09-01 ENCOUNTER — Emergency Department (HOSPITAL_COMMUNITY)
Admission: EM | Admit: 2021-09-01 | Discharge: 2021-09-01 | Disposition: A | Discharge status: Home / Self Care | Payer: Medicare Other | Attending: Emergency Medicine | Admitting: Emergency Medicine

## 2021-09-01 DIAGNOSIS — Z79899 Other long term (current) drug therapy: Secondary | ICD-10-CM | POA: Insufficient documentation

## 2021-09-01 DIAGNOSIS — E119 Type 2 diabetes mellitus without complications: Secondary | ICD-10-CM | POA: Diagnosis not present

## 2021-09-01 DIAGNOSIS — R109 Unspecified abdominal pain: Secondary | ICD-10-CM | POA: Diagnosis present

## 2021-09-01 DIAGNOSIS — I7143 Infrarenal abdominal aortic aneurysm, without rupture: Secondary | ICD-10-CM

## 2021-09-01 DIAGNOSIS — Z7902 Long term (current) use of antithrombotics/antiplatelets: Secondary | ICD-10-CM | POA: Insufficient documentation

## 2021-09-01 DIAGNOSIS — Z7984 Long term (current) use of oral hypoglycemic drugs: Secondary | ICD-10-CM | POA: Insufficient documentation

## 2021-09-01 DIAGNOSIS — I10 Essential (primary) hypertension: Secondary | ICD-10-CM | POA: Diagnosis not present

## 2021-09-01 DIAGNOSIS — R1084 Generalized abdominal pain: Secondary | ICD-10-CM | POA: Diagnosis not present

## 2021-09-01 LAB — I-STAT CREATININE, ED: Creatinine, Ser: 1.7 mg/dL — ABNORMAL HIGH (ref 0.61–1.24)

## 2021-09-01 MED ORDER — IOHEXOL 350 MG/ML SOLN
100.0000 mL | Freq: Once | INTRAVENOUS | Status: AC | PRN
Start: 2021-09-01 — End: 2021-09-01
  Administered 2021-09-01: 100 mL via INTRAVENOUS

## 2021-09-01 NOTE — ED Provider Notes (Signed)
?[facility] ?Provider Note ? ? ?CSN: [id number] ?Arrival date & time: 09/01/21  0805 ? ?  ? ?History ?PMH: Past medical history of AAA, diabetes, hypertension ?Chief Complaint  ?Patient presents with  ? Abdominal Pain  ? ?An interpreter was used during this encounter. ?Eddie Graham is a 87 y.o. male.  Patient presents the emergency department for a scheduled CTA of the abdomen and pelvis.  He states that he has had abdominal pain for about 6 years.  It was supposed to be getting image today, but somehow ended up to at the emergency department.  He says that the symptoms have not gotten any worse or better.  He does not feel like he has an emergency at this time.  Patient denies any nausea, vomiting, diarrhea, lightheadedness, syncope, visual disturbance, chest pain, or shortness of breath. ? ? ?Abdominal Pain ? ?  ? ?Home Medications ?Prior to Admission medications   ?Medication Sig Start Date End Date Taking? Authorizing Provider  ?albuterol (VENTOLIN HFA) 108 (90 Base) MCG/ACT inhaler Inhale 2 puffs into the lungs every 4 (four) hours as needed for wheezing. 11/11/20   [provider]  ?clopidogrel (PLAVIX) 75 MG tablet Take 75 mg by mouth daily. 01/24/21   [provider]  ?DULoxetine (CYMBALTA) 60 MG capsule Take 60 mg by mouth daily. 02/07/21   [provider]  ?gabapentin (NEURONTIN) 100 MG capsule Take 100 mg by mouth 3 (three) times daily. 02/25/21   [provider]  ?glipiZIDE (GLUCOTROL) 10 MG tablet Take 10 mg by mouth 2 (two) times daily. 03/25/21   [provider]  ?metoprolol succinate (TOPROL-XL) 25 MG 24 hr tablet Take 25 mg by mouth daily. 04/07/21   [provider]  ?montelukast (SINGULAIR) 10 MG tablet Take 10 mg by mouth daily. 03/19/21   [provider]  ?pantoprazole (PROTONIX) 40 MG tablet Take 40 mg by mouth daily. 02/14/21   [provider]  ?rosuvastatin (CRESTOR) 40 MG  tablet Take 40 mg by mouth daily. 04/07/21   [provider]  ?RYBELSUS 7 MG TABS Take 7 mg by mouth daily. 03/14/21   [provider]  ?traZODone (DESYREL) 50 MG tablet Take 50 mg by mouth at bedtime. 03/14/21   [provider]  ?valsartan (DIOVAN) 80 MG tablet Take 80 mg by mouth daily. 03/14/21   [provider]  ?   ? ?Allergies    ?Patient has no known allergies.   ? ?Review of Systems   ?Review of Systems  ?Gastrointestinal:  Positive for abdominal pain.  ?All other systems reviewed and are negative. ? ?Physical Exam ?Updated Vital Signs ?BP 125/66   Pulse 71   Temp 97.6 ?F (36.4 ?C)   Resp 18   Ht '5\' 6"'$  (1.676 m)   Wt 80 kg   SpO2 96%   BMI 28.47 kg/m?  ?Physical Exam ?Vitals and nursing note reviewed.  ?Constitutional:   ?   General: He is not in acute distress. ?   Appearance: Normal appearance. He is well-developed. He is not ill-appearing, toxic-appearing or diaphoretic.  ?HENT:  ?   Head: Normocephalic and atraumatic.  ?   Nose: No nasal deformity.  ?   Mouth/Throat:  ?   Lips: Pink. No lesions.  ?Eyes:  ?   General: Gaze aligned appropriately. No scleral icterus.    ?   Right eye: No discharge.     ?   Left eye: No discharge.  ?  Conjunctiva/sclera: Conjunctivae normal.  ?   Right eye: Right conjunctiva is not injected. No exudate or hemorrhage. ?   Left eye: Left conjunctiva is not injected. No exudate or hemorrhage. ?Pulmonary:  ?   Effort: Pulmonary effort is normal. No respiratory distress.  ?Abdominal:  ?   General: Abdomen is flat.  ?   Palpations: Abdomen is soft.  ?   Tenderness: There is generalized abdominal tenderness. There is no right CVA tenderness, left CVA tenderness, guarding or rebound. Negative signs include Murphy's sign, Rovsing's sign and McBurney's sign.  ?   Hernia: No hernia is present.  ?   Comments: Mild generalized tenderness  ?Skin: ?   General: Skin is warm and dry.  ?Neurological:  ?   Mental Status: He is alert and oriented to  person, place, and time.  ?Psychiatric:     ?   Mood and Affect: Mood normal.     ?   Speech: Speech normal.     ?   Behavior: Behavior normal. Behavior is cooperative.  ? ? ?ED Results / Procedures / Treatments   ?Labs ?(all labs ordered are listed, but only abnormal results are displayed) ?Labs Reviewed - No data to display ? ?EKG ?None ? ?Radiology ?No results found. ? ?Procedures ?Procedures  ? ?Medications Ordered in ED ?Medications - No data to display ? ?ED Course/ Medical Decision Making/ A&P ?  ?                        ?Medical Decision Making ? ?She is presenting to the emergency department to get a CTA of his abdomen and pelvis.  You, he was seen by vascular surgery on April 11.  He was found to have an incidental abdominal aortic aneurysm after a trauma in March 2023.  It was recommended that he get an outpatient CT abdomen and pelvis.  He is scheduled for this today.  Looks like the appointment was at 9 AM.  I did discuss whether patient presented here for an emergency or if he was simply just trying to get to his CT scan.  After further inquiry, it looks like he has had no new problems and does not feel he has an emergency at this time.  He wants to try to go to his scheduled appointment.  He has a benign abdomen on exam.  His vitals are stable.  I think that he is stable to go over to the outpatient radiology department.  I personally walked him over there and helped him check in. ? ?This is a supervised visit with my attending physician, Dr. Francia Greaves.  We have discussed the plan of care of this patient and he has changed the plan as needed. ? ?Final Clinical Impression(s) / ED Diagnoses ?Final diagnoses:  ?Generalized abdominal pain  ? ? ?Rx / DC Orders ?ED Discharge Orders   ? ? None  ? ?  ? ? ?  ?Adolphus Birchwood, PA-C ?09/01/21 4132 ? ?  ?Valarie Merino, MD ?09/02/21 1310 ? ?

## 2021-09-01 NOTE — ED Triage Notes (Signed)
Pt states his stomach has been hurting him for "years and years." Pt refuses to answer how bad the pain is. States he does not want to talk about it.  ?

## 2021-09-01 NOTE — Discharge Instructions (Signed)
Please go to [facility] Radiology CT Imaging Department for your scheduled appointment. ?

## 2021-09-02 ENCOUNTER — Ambulatory Visit: Payer: Medicaid Other | Admitting: Vascular Surgery

## 2021-09-09 ENCOUNTER — Encounter: Payer: Self-pay | Admitting: Vascular Surgery

## 2021-09-09 ENCOUNTER — Ambulatory Visit (INDEPENDENT_AMBULATORY_CARE_PROVIDER_SITE_OTHER): Payer: Medicare Other | Admitting: Vascular Surgery

## 2021-09-09 VITALS — BP 103/72 | HR 72 | Temp 97.6°F | Resp 18 | Ht 66.0 in | Wt 180.0 lb

## 2021-09-09 DIAGNOSIS — I7143 Infrarenal abdominal aortic aneurysm, without rupture: Secondary | ICD-10-CM | POA: Diagnosis not present

## 2021-09-09 NOTE — Progress Notes (Signed)
? ? ?Patient name: Eddie Graham MRN: [id number] DOB: Feb 26, 1935 Sex: male ? ?REASON FOR CONSULT: Follow-up after CTA for evaluation of saccular abdominal aortic aneurysm ? ?HPI: ?Eddie Graham is a 87 y.o. male, with history of hypertension, diabetes, chronic back pain that presents for follow-up after CTA for evaluation of a saccular abdominal aortic aneurysm.  He was seen in the ED following a fall at home and had a CT abdomen w/o contrast on 07/10/2021.  This demonstrated a 3.2 cm saccular aneurysm on the right side of the distal abdominal aorta per the radiology report.  He had no prior knowledge of this aneurysm.  He has chronic back pain but no abdominal pain.  He does smoke daily.  Lives independently.  Previous abdominal surgery includes prostate surgery.  Ultimately I sent him for CTA for further evaluation.  Today he states his whole body hurts. ? ?Past Medical History:  ?Diagnosis Date  ? AAA (abdominal aortic aneurysm) ([place])   ? Back pain   ? Diabetes mellitus without complication ([place])   ? Hypertension   ? ? ?Past Surgical History:  ?Procedure Laterality Date  ? CARDIAC SURGERY    ? PROSTATE SURGERY    ? ? ?History reviewed. No pertinent family history. ? ?SOCIAL HISTORY: ?Social History  ? ?Socioeconomic History  ? Marital status: Married  ?  Spouse name: Not on file  ? Number of children: Not on file  ? Years of education: Not on file  ? Highest education level: Not on file  ?Occupational History  ? Not on file  ?Tobacco Use  ? Smoking status: Never  ? Smokeless tobacco: Never  ?Vaping Use  ? Vaping Use: Never used  ?Substance and Sexual Activity  ? Alcohol use: Not Currently  ? Drug use: Not Currently  ? Sexual activity: Not on file  ?Other Topics Concern  ? Not on file  ?Social History Narrative  ? Not on file  ? ?Social Determinants of Health  ? ?Financial Resource Strain: Low Risk   ? Difficulty of Paying Living Expenses: Not very hard  ?Food Insecurity: No Food Insecurity   ? Worried About Charity fundraiser in the Last Year: Never true  ? Ran Out of Food in the Last Year: Never true  ?Transportation Needs: Unmet Transportation Needs  ? Lack of Transportation (Medical): Yes  ? Lack of Transportation (Non-Medical): Yes  ?Physical Activity: Not on file  ?Stress: Not on file  ?Social Connections: Not on file  ?Intimate Partner Violence: Not on file  ? ? ?No Known Allergies ? ?Current Outpatient Medications  ?Medication Sig Dispense Refill  ? albuterol (VENTOLIN HFA) 108 (90 Base) MCG/ACT inhaler Inhale 2 puffs into the lungs every 4 (four) hours as needed for wheezing.    ? clopidogrel (PLAVIX) 75 MG tablet Take 75 mg by mouth daily.    ? DULoxetine (CYMBALTA) 60 MG capsule Take 60 mg by mouth daily.    ? gabapentin (NEURONTIN) 100 MG capsule Take 100 mg by mouth 3 (three) times daily.    ? glipiZIDE (GLUCOTROL) 10 MG tablet Take 10 mg by mouth 2 (two) times daily.    ? metoprolol succinate (TOPROL-XL) 25 MG 24 hr tablet Take 25 mg by mouth daily.    ? montelukast (SINGULAIR) 10 MG tablet Take 10 mg by mouth daily.    ? pantoprazole (PROTONIX) 40 MG tablet Take 40 mg by mouth daily.    ? rosuvastatin (CRESTOR) 40 MG tablet Take 40 mg by mouth  daily.    ? RYBELSUS 7 MG TABS Take 7 mg by mouth daily.    ? traZODone (DESYREL) 50 MG tablet Take 50 mg by mouth at bedtime.    ? valsartan (DIOVAN) 80 MG tablet Take 80 mg by mouth daily.    ? ?No current facility-administered medications for this visit.  ? ? ?REVIEW OF SYSTEMS:  ?'[X]'$  denotes positive finding, '[ ]'$  denotes negative finding ?Cardiac  Comments:  ?Chest pain or chest pressure:    ?Shortness of breath upon exertion:    ?Short of breath when lying flat:    ?Irregular heart rhythm:    ?    ?Vascular    ?Pain in calf, thigh, or hip brought on by ambulation:    ?Pain in feet at night that wakes you up from your sleep:     ?Blood clot in your veins:    ?Leg swelling:     ?    ?Pulmonary    ?Oxygen at home:    ?Productive cough:      ?Wheezing:     ?    ?Neurologic    ?Sudden weakness in arms or legs:     ?Sudden numbness in arms or legs:     ?Sudden onset of difficulty speaking or slurred speech:    ?Temporary loss of vision in one eye:     ?Problems with dizziness:     ?    ?Gastrointestinal    ?Blood in stool:     ?Vomited blood:     ?    ?Genitourinary    ?Burning when urinating:     ?Blood in urine:    ?    ?Psychiatric    ?Major depression:     ?    ?Hematologic    ?Bleeding problems:    ?Problems with blood clotting too easily:    ?    ?Skin    ?Rashes or ulcers:    ?    ?Constitutional    ?Fever or chills:    ? ? ?PHYSICAL EXAM: ?Vitals:  ? 09/09/21 1023  ?BP: 103/72  ?Pulse: 72  ?Resp: 18  ?Temp: 97.6 ?F (36.4 ?C)  ?TempSrc: Temporal  ?SpO2: 90%  ?Weight: 180 lb (81.6 kg)  ?Height: '5\' 6"'$  (1.676 m)  ? ? ?GENERAL: The patient is a well-nourished male, in no acute distress. The vital signs are documented above. ?CARDIAC: There is a regular rate and rhythm.  ?VASCULAR:  ?Palpable femoral pulses bilaterally ?Palpable PT pulses bilaterally ?PULMONARY: No respiratory distress ?ABDOMEN: Soft and non-tender.  No pain with deep palpation. ?MUSCULOSKELETAL: There are no major deformities or cyanosis. ?NEUROLOGIC: No focal weakness or paresthesias are detected. ?SKIN: There are no ulcers or rashes noted. ?PSYCHIATRIC: The patient has a normal affect. ? ?DATA:  ? ?CTA reviewed from 09/01/21  ? ? ? ? ? ?Assessment/Plan: ? ?87 year old male presents for follow-up after CTA for further evaluation of a suspected saccular abdominal aortic aneurysm that was discovered incidentally following a CT w/o contrast in the [place] ED after he presented with blunt trauma from a fall.  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  I would be comfortable following this for now.  I will see him in 6 months with CT abdomen pelvis without contrast.  Maximal diameter of this aneurysmal segment is 3.3  cm as pictured above.  Discussed if interval change in the future may ultimately require stent grafting for  treatment. 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  He states he already has a urologist and has an appointment. ? ?Marty Heck, MD ?[facility] ?Office: [phone] ? ? ? ? ?

## 2021-09-12 ENCOUNTER — Other Ambulatory Visit: Payer: Self-pay | Admitting: Urology

## 2021-09-24 NOTE — Patient Instructions (Signed)
DUE TO COVID-19 ONLY TWO VISITORS  (aged 85 and older)  IS ALLOWED TO COME WITH YOU AND STAY IN THE WAITING ROOM ONLY DURING PRE OP AND PROCEDURE.   **NO VISITORS ARE ALLOWED IN THE SHORT STAY AREA OR RECOVERY ROOM!!**  IYou are not required to quarantine Hand Hygiene often Do NOT share personal items Notify your provider if you are in close contact with someone who has COVID or you develop fever 100.4 or greater, new onset of sneezing, cough, sore throat, shortness of breath or body aches.        Your procedure is scheduled on:  September 30, 2021   Report to [facility] Main Entrance    Report to admitting at 05:45 AM   Call this number if you have problems the morning of surgery [phone]   Do not eat food :After Midnight.   After Midnight you may have the following liquids until 0500 AM DAY OF SURGERY  Clear Liquid Diet Water Black Coffee (sugar ok, NO MILK/CREAM OR CREAMERS)  Tea (sugar ok, NO MILK/CREAM OR CREAMERS) regular and decaf                             Plain Jell-O (NO RED)                                           Fruit ices (not with fruit pulp, NO RED)                                     Popsicles (NO RED)                                                                  Juice: apple, WHITE grape, WHITE cranberry Sports drinks like Gatorade (NO RED) Clear broth(vegetable,chicken,beef)          If you have questions, please contact your surgeon's office.            FOLLOW ANY ADDITIONAL PRE OP INSTRUCTIONS YOU RECEIVED FROM YOUR SURGEON'S OFFICE!!!   Oral Hygiene is also important to reduce your risk of infection.                                    Remember - BRUSH YOUR TEETH THE MORNING OF SURGERY WITH YOUR REGULAR TOOTHPASTE   Do NOT smoke after Midnight   Take these medicines the morning of surgery with A SIP OF WATER: CYMBALTA, NEURONTIN, METOPROLOL, SINGULAIR, PROTONIX, CRESTOR                          OK to use your ALBUTEROL INHALER if needed  the morning of surgery  How to Manage Your Diabetes Before and After Surgery  Why is it important to control my blood sugar before and after surgery? Improving blood sugar levels before and after surgery helps healing and can limit problems. A way of improving blood sugar  control is eating a healthy diet by:  Eating less sugar and carbohydrates  Increasing activity/exercise  Talking with your doctor about reaching your blood sugar goals High blood sugars (greater than 180 mg/dL) can raise your risk of infections and slow your recovery, so you will need to focus on controlling your diabetes during the weeks before surgery. Make sure that the doctor who takes care of your diabetes knows about your planned surgery including the date and location.  How do I manage my blood sugar before surgery? Check your blood sugar at least 4 times a day, starting 2 days before surgery, to make sure that the level is not too high or low. Check your blood sugar the morning of your surgery when you wake up and every 2 hours until you get to the Short Stay unit. If your blood sugar is less than 70 mg/dL, you will need to treat for low blood sugar: Do not take insulin. Treat a low blood sugar (less than 70 mg/dL) with  cup of clear juice (cranberry or apple), 4 glucose tablets, OR glucose gel. Recheck blood sugar in 15 minutes after treatment (to make sure it is greater than 70 mg/dL). If your blood sugar is not greater than 70 mg/dL on recheck, call [phone] for further instructions. Report your blood sugar to the short stay nurse when you get to Short Stay.  If you are admitted to the hospital after surgery: Your blood sugar will be checked by the staff and you will probably be given insulin after surgery (instead of oral diabetes medicines) to make sure you have good blood sugar levels. The goal for blood sugar control after surgery is 80-180 mg/dL.   WHAT DO I DO ABOUT MY DIABETES MEDICATION?  Do not  take oral diabetes medicines (pills) the morning of surgery.  THE DAY BEFORE SURGERY: take morning dose of GLIPIZIDE, Do not take evening dose of GLIPIZIDE.                                                             Take RYBELSUS as prescribed.   THE MORNING OF SURGERY: DO NOT TAKE GLIPIZIDE OR RYBELSUS  Reviewed and Endorsed by [facility] Patient Education Committee, August 2015              You may not have any metal on your body including jewelry, and body piercing             Do not wear make-up, lotions, powders, cologne, or deodorant              Men may shave face and neck.   Do not bring valuables to the hospital. [place].   Contacts, dentures or bridgework may not be worn into surgery.   Patients discharged on the day of surgery will not be allowed to drive home.  Someone NEEDS to stay with you for the first 24 hours after anesthesia.  Special Instructions: Bring a copy of your healthcare power of attorney and living will documents the day of surgery if you haven't scanned them before.  Please read over the following fact sheets you were given: IF [facility] [place] - Preparing for Surgery  Before surgery, you can play an important role.  Because skin is not sterile, your skin needs to be as free of germs as possible.  You can reduce the number of germs on your skin by washing with CHG (chlorahexidine gluconate) soap before surgery.  CHG is an antiseptic cleaner which kills germs and bonds with the skin to continue killing germs even after washing. Please DO NOT use if you have an allergy to CHG or antibacterial soaps.  If your skin becomes reddened/irritated stop using the CHG and inform your nurse when you arrive at Short Stay. Do not shave (including legs and underarms) for at least 48 hours prior to the first CHG shower.  You may shave your face/neck.  Please follow  these instructions carefully:  1.  Shower with CHG Soap the night before surgery and the  morning of surgery.  2.  If you choose to wash your hair, wash your hair first as usual with your normal  shampoo.  3.  After you shampoo, rinse your hair and body thoroughly to remove the shampoo.                             4.  Use CHG as you would any other liquid soap.  You can apply chg directly to the skin and wash.  Gently with a scrungie or clean washcloth.  5.  Apply the CHG Soap to your body ONLY FROM THE NECK DOWN.   Do   not use on face/ open                           Wound or open sores. Avoid contact with eyes, ears mouth and   genitals (private parts).                       Wash face,  Genitals (private parts) with your normal soap.             6.  Wash thoroughly, paying special attention to the area where your    surgery  will be performed.  7.  Thoroughly rinse your body with warm water from the neck down.  8.  DO NOT shower/wash with your normal soap after using and rinsing off the CHG Soap.                9.  Pat yourself dry with a clean towel.            10.  Wear clean pajamas.            11.  Place clean sheets on your bed the night of your first shower and do not  sleep with pets. Day of Surgery : Do not apply any lotions/deodorants the morning of surgery.  Please wear clean clothes to the hospital/surgery center.  FAILURE TO FOLLOW THESE INSTRUCTIONS MAY RESULT IN THE CANCELLATION OF YOUR SURGERY  PATIENT SIGNATURE_________________________________  NURSE SIGNATURE__________________________________  ________________________________________________________________________

## 2021-09-24 NOTE — Progress Notes (Signed)
COVID Vaccine Completed:  Date of COVID positive in last 90 days:  PCP - Dr. Yong Channel   CEW Cardiologist -   Chest x-ray - 07-10-21 EPIC EKG - 04-24-21 EPIC Stress Test -  ECHO -  Cardiac Cath -  Pacemaker/ICD device last checked: Spinal Cord Stimulator:  Bowel Prep -   Sleep Study -  CPAP -   Fasting Blood Sugar -  Checks Blood Sugar _____ times a day  Blood Thinner Instructions: Plavix Aspirin Instructions: Last Dose:  Activity level:  Can go up a flight of stairs and perform activities of daily living without stopping and without symptoms of chest pain or shortness of breath.   Able to exercise without symptoms  Unable to go up a flight of stairs without symptoms of      Anesthesia review: Hx AAA ([facility] sees), DM, HTN, COPD, CKD  Patient denies shortness of breath, fever, cough and chest pain at PAT appointment   Patient verbalized understanding of instructions that were given to them at the PAT appointment. Patient was also instructed that they will need to review over the PAT instructions again at home before surgery.

## 2021-09-25 ENCOUNTER — Other Ambulatory Visit: Payer: Self-pay

## 2021-09-25 ENCOUNTER — Encounter (HOSPITAL_COMMUNITY)
Admission: RE | Admit: 2021-09-25 | Discharge: 2021-09-25 | Disposition: A | Discharge status: Home / Self Care | Payer: Medicare Other | Source: Ambulatory Visit | Attending: Urology | Admitting: Urology

## 2021-09-25 ENCOUNTER — Encounter (HOSPITAL_COMMUNITY): Payer: Self-pay

## 2021-09-25 VITALS — BP 101/62 | HR 75 | Temp 97.8°F | Resp 20 | Ht 66.0 in | Wt 174.2 lb

## 2021-09-25 DIAGNOSIS — N183 Chronic kidney disease, stage 3 unspecified: Secondary | ICD-10-CM | POA: Diagnosis not present

## 2021-09-25 DIAGNOSIS — E1122 Type 2 diabetes mellitus with diabetic chronic kidney disease: Secondary | ICD-10-CM | POA: Diagnosis not present

## 2021-09-25 DIAGNOSIS — Z01812 Encounter for preprocedural laboratory examination: Secondary | ICD-10-CM | POA: Insufficient documentation

## 2021-09-25 DIAGNOSIS — K5730 Diverticulosis of large intestine without perforation or abscess without bleeding: Secondary | ICD-10-CM | POA: Insufficient documentation

## 2021-09-25 DIAGNOSIS — E119 Type 2 diabetes mellitus without complications: Secondary | ICD-10-CM

## 2021-09-25 DIAGNOSIS — N35919 Unspecified urethral stricture, male, unspecified site: Secondary | ICD-10-CM | POA: Diagnosis not present

## 2021-09-25 DIAGNOSIS — I129 Hypertensive chronic kidney disease with stage 1 through stage 4 chronic kidney disease, or unspecified chronic kidney disease: Secondary | ICD-10-CM | POA: Insufficient documentation

## 2021-09-25 DIAGNOSIS — D494 Neoplasm of unspecified behavior of bladder: Secondary | ICD-10-CM | POA: Diagnosis not present

## 2021-09-25 DIAGNOSIS — I7143 Infrarenal abdominal aortic aneurysm, without rupture: Secondary | ICD-10-CM | POA: Diagnosis not present

## 2021-09-25 DIAGNOSIS — D649 Anemia, unspecified: Secondary | ICD-10-CM

## 2021-09-25 DIAGNOSIS — J449 Chronic obstructive pulmonary disease, unspecified: Secondary | ICD-10-CM | POA: Insufficient documentation

## 2021-09-25 DIAGNOSIS — Z01818 Encounter for other preprocedural examination: Secondary | ICD-10-CM

## 2021-09-25 HISTORY — DX: Depression, unspecified: F32.A

## 2021-09-25 HISTORY — DX: Chronic kidney disease, unspecified: N18.9

## 2021-09-25 HISTORY — DX: Unspecified osteoarthritis, unspecified site: M19.90

## 2021-09-25 HISTORY — DX: Insomnia, unspecified: G47.00

## 2021-09-25 HISTORY — DX: Chronic obstructive pulmonary disease, unspecified: J44.9

## 2021-09-25 HISTORY — DX: Anemia, unspecified: D64.9

## 2021-09-25 HISTORY — DX: Anxiety disorder, unspecified: F41.9

## 2021-09-25 HISTORY — DX: Dyspnea, unspecified: R06.00

## 2021-09-25 LAB — BASIC METABOLIC PANEL
Anion gap: 9 (ref 5–15)
BUN: 32 mg/dL — ABNORMAL HIGH (ref 8–23)
CO2: 26 mmol/L (ref 22–32)
Calcium: 9.1 mg/dL (ref 8.9–10.3)
Chloride: 101 mmol/L (ref 98–111)
Creatinine, Ser: 1.49 mg/dL — ABNORMAL HIGH (ref 0.61–1.24)
GFR, Estimated: 46 mL/min — ABNORMAL LOW (ref >60)
Glucose, Bld: 363 mg/dL — ABNORMAL HIGH (ref 70–99)
Potassium: 4.5 mmol/L (ref 3.5–5.1)
Sodium: 136 mmol/L (ref 135–145)

## 2021-09-25 LAB — GLUCOSE, CAPILLARY: Glucose-Capillary: 365 mg/dL — ABNORMAL HIGH (ref 70–99)

## 2021-09-25 LAB — CBC
HCT: 46.4 % (ref 39.0–52.0)
Hemoglobin: 15.3 g/dL (ref 13.0–17.0)
MCH: 28.4 pg (ref 26.0–34.0)
MCHC: 33 g/dL (ref 30.0–36.0)
MCV: 86.1 fL (ref 80.0–100.0)
Platelets: 234 10*3/uL (ref 150–400)
RBC: 5.39 MIL/uL (ref 4.22–5.81)
RDW: 13.8 % (ref 11.5–15.5)
WBC: 9.4 10*3/uL (ref 4.0–10.5)
nRBC: 0 % (ref 0.0–0.2)

## 2021-09-26 NOTE — Anesthesia Preprocedure Evaluation (Addendum)
Anesthesia Evaluation  Patient identified by MRN, date of birth, ID band Patient awake    Reviewed: Allergy & Precautions, NPO status , Patient's Chart, lab work & pertinent test results  History of Anesthesia Complications Negative for: history of anesthetic complications  Airway Mallampati: III  TM Distance: >3 FB Neck ROM: Full    Dental  (+) Poor Dentition, Dental Advisory Given   Pulmonary COPD,    Pulmonary exam normal        Cardiovascular hypertension, Pt. on home beta blockers Normal cardiovascular exam     Neuro/Psych PSYCHIATRIC DISORDERS Anxiety Depression negative neurological ROS     GI/Hepatic negative GI ROS, Neg liver ROS,   Endo/Other  diabetes, Poorly Controlled  Renal/GU CRFRenal disease     Musculoskeletal negative musculoskeletal ROS (+)   Abdominal   Peds  Hematology negative hematology ROS (+)   Anesthesia Other Findings   Reproductive/Obstetrics                           Anesthesia Physical Anesthesia Plan  ASA: 3  Anesthesia Plan: General   Post-op Pain Management: Tylenol PO (pre-op)*   Induction:   PONV Risk Score and Plan: 2 and Ondansetron and Diphenhydramine  Airway Management Planned: LMA and Oral ETT  Additional Equipment:   Intra-op Plan:   Post-operative Plan: Extubation in OR  Informed Consent: I have reviewed the patients History and Physical, chart, labs and discussed the procedure including the risks, benefits and alternatives for the proposed anesthesia with the patient or authorized representative who has indicated his/her understanding and acceptance.     Dental advisory given  Plan Discussed with: CRNA and Anesthesiologist  Anesthesia Plan Comments: (See PAT note 09/25/21)       Anesthesia Quick Evaluation

## 2021-09-26 NOTE — Progress Notes (Signed)
Anesthesia Chart Review   Case: [id number] Date/Time: 09/30/21 0745   Procedures:      TRANSURETHRAL RESECTION OF BLADDER TUMOR (TURBT) - 26 MINS     CYSTOSCOPY WITH  URETHRAL BALLOON  DILATATION   Anesthesia type: General   Pre-op diagnosis: URETHRAL STRICTURE, BLADDER TUMOR   Location: [facility] PROCEDURE [facility] / [facility]   Surgeons: Robley Fries, MD       DISCUSSION:87 y.o. never smoker with h/o HTN, DM II, CKD Stage III, COPD, urethral stricture, bladder t umor scheduled for above procedure 09/30/2021 with Dr. Jacalyn Lefevre.   Pt seen by vascular surgeon for evaluation of saccular abdominal aortic aneurysm.  Maximal diameter of this aneurysmal segment is 3.3 cm. Per OV note will follow, no intervention at this time.  6 month visit with CT scheduled.   Pt last seen by PCP 08/18/2021. COPD controlled at this time.  At this visit A1C 12.0, which is down from >18.0 on 04/30/21.  Non-fasting blood glucose at PAT visit 363.  Evaluate DOS. Ris of cancellation discussed.    VS: BP 101/62   Pulse 75   Temp 36.6 C (Oral)   Resp 20   Ht '5\' 6"'$  (1.676 m)   Wt 79 kg   SpO2 97%   BMI 28.11 kg/m   PROVIDERS: Default, Provider, MD  Varney Biles, MD is PCP  Monica Martinez, MD is Vascular Surgeon LABS:  Elevated blood glucose, evaluate DOS (all labs ordered are listed, but only abnormal results are displayed)  Labs Reviewed  BASIC METABOLIC PANEL - Abnormal; Notable for the following components:      Result Value   Glucose, Bld 363 (*)    BUN 32 (*)    Creatinine, Ser 1.49 (*)    GFR, Estimated 46 (*)    All other components within normal limits  GLUCOSE, CAPILLARY - Abnormal; Notable for the following components:   Glucose-Capillary 365 (*)    All other components within normal limits  CBC     IMAGES: CT Abdomen 09/01/2021 IMPRESSION: 1. 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 Guidelines recommend referral to a vascular specialist (the patient is  currently under the care of a vascular specialist/surgeon). 2. 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 3. 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 If not previously obtained, recommend urologic consultation. 4. Colonic diverticulosis    EKG: 04/25/2021 Rate 78 bpm  NSR LAD Anterior infarct, age undetermined ST & T wave abnormality consider inferior ischemia  CV:  Past Medical History:  Diagnosis Date   AAA (abdominal aortic aneurysm) (HCC)    Anemia    Anxiety    Arthritis    Back pain    Chronic kidney disease    COPD (chronic obstructive pulmonary disease) (HCC)    Depression    Diabetes mellitus without complication ([place])    Dyspnea    Hypertension    Insomnia     Past Surgical History:  Procedure Laterality Date   CARDIAC SURGERY     PROSTATE SURGERY      MEDICATIONS:  albuterol (VENTOLIN HFA) 108 (90 Base) MCG/ACT inhaler   clopidogrel (PLAVIX) 75 MG tablet   DULoxetine (CYMBALTA) 60 MG capsule   gabapentin (NEURONTIN) 100 MG capsule   glipiZIDE (GLUCOTROL) 10 MG tablet   LANTUS SOLOSTAR 100 UNIT/ML Solostar Pen   metoprolol succinate (TOPROL-XL) 25 MG 24 hr tablet   montelukast (SINGULAIR) 10 MG tablet   pantoprazole (PROTONIX)  40 MG tablet   rosuvastatin (CRESTOR) 40 MG tablet   Semaglutide 14 MG TABS   traZODone (DESYREL) 50 MG tablet   valsartan (DIOVAN) 80 MG tablet   No current facility-administered medications for this encounter.     Konrad Felix Ward, PA-C [facility] Pre-Surgical Testing [phone]

## 2021-09-30 ENCOUNTER — Ambulatory Visit (HOSPITAL_COMMUNITY): Payer: Medicare Other

## 2021-09-30 ENCOUNTER — Encounter (HOSPITAL_COMMUNITY): Payer: Self-pay | Admitting: Urology

## 2021-09-30 ENCOUNTER — Other Ambulatory Visit: Payer: Self-pay

## 2021-09-30 ENCOUNTER — Encounter (HOSPITAL_COMMUNITY)
Admission: RE | Disposition: A | Discharge status: Home / Self Care | Payer: Self-pay | Source: Home / Self Care | Attending: Urology

## 2021-09-30 ENCOUNTER — Ambulatory Visit (HOSPITAL_BASED_OUTPATIENT_CLINIC_OR_DEPARTMENT_OTHER): Payer: Medicare Other | Admitting: Anesthesiology

## 2021-09-30 ENCOUNTER — Observation Stay (HOSPITAL_COMMUNITY)
Admission: RE | Admit: 2021-09-30 | Discharge: 2021-10-01 | Disposition: A | Discharge status: Home / Self Care | Payer: Medicare Other | Attending: Urology | Admitting: Urology

## 2021-09-30 ENCOUNTER — Ambulatory Visit (HOSPITAL_COMMUNITY): Payer: Medicare Other | Admitting: Physician Assistant

## 2021-09-30 DIAGNOSIS — C679 Malignant neoplasm of bladder, unspecified: Secondary | ICD-10-CM | POA: Diagnosis not present

## 2021-09-30 DIAGNOSIS — Z7902 Long term (current) use of antithrombotics/antiplatelets: Secondary | ICD-10-CM | POA: Insufficient documentation

## 2021-09-30 DIAGNOSIS — I129 Hypertensive chronic kidney disease with stage 1 through stage 4 chronic kidney disease, or unspecified chronic kidney disease: Secondary | ICD-10-CM | POA: Diagnosis not present

## 2021-09-30 DIAGNOSIS — E1122 Type 2 diabetes mellitus with diabetic chronic kidney disease: Secondary | ICD-10-CM

## 2021-09-30 DIAGNOSIS — N189 Chronic kidney disease, unspecified: Secondary | ICD-10-CM | POA: Diagnosis not present

## 2021-09-30 DIAGNOSIS — N35011 Post-traumatic bulbous urethral stricture: Secondary | ICD-10-CM | POA: Diagnosis not present

## 2021-09-30 DIAGNOSIS — D494 Neoplasm of unspecified behavior of bladder: Secondary | ICD-10-CM

## 2021-09-30 DIAGNOSIS — D419 Neoplasm of uncertain behavior of unspecified urinary organ: Secondary | ICD-10-CM | POA: Diagnosis present

## 2021-09-30 DIAGNOSIS — Z79899 Other long term (current) drug therapy: Secondary | ICD-10-CM | POA: Insufficient documentation

## 2021-09-30 DIAGNOSIS — Z794 Long term (current) use of insulin: Secondary | ICD-10-CM | POA: Diagnosis not present

## 2021-09-30 DIAGNOSIS — E119 Type 2 diabetes mellitus without complications: Secondary | ICD-10-CM | POA: Diagnosis not present

## 2021-09-30 DIAGNOSIS — I10 Essential (primary) hypertension: Secondary | ICD-10-CM | POA: Diagnosis not present

## 2021-09-30 DIAGNOSIS — N3289 Other specified disorders of bladder: Secondary | ICD-10-CM | POA: Diagnosis present

## 2021-09-30 DIAGNOSIS — J45909 Unspecified asthma, uncomplicated: Secondary | ICD-10-CM | POA: Diagnosis not present

## 2021-09-30 HISTORY — PX: TRANSURETHRAL RESECTION OF BLADDER TUMOR: SHX2575

## 2021-09-30 HISTORY — PX: CYSTOSCOPY WITH URETHRAL DILATATION: SHX5125

## 2021-09-30 LAB — HEMOGLOBIN A1C
Hgb A1c MFr Bld: 13.2 % — ABNORMAL HIGH (ref 4.8–5.6)
Mean Plasma Glucose: 332.14 mg/dL

## 2021-09-30 LAB — GLUCOSE, CAPILLARY
Glucose-Capillary: 206 mg/dL — ABNORMAL HIGH (ref 70–99)
Glucose-Capillary: 215 mg/dL — ABNORMAL HIGH (ref 70–99)
Glucose-Capillary: 350 mg/dL — ABNORMAL HIGH (ref 70–99)
Glucose-Capillary: 396 mg/dL — ABNORMAL HIGH (ref 70–99)

## 2021-09-30 SURGERY — TURBT (TRANSURETHRAL RESECTION OF BLADDER TUMOR)
Anesthesia: General | Site: Urethra

## 2021-09-30 MED ORDER — ONDANSETRON HCL 4 MG/2ML IJ SOLN
INTRAMUSCULAR | Status: DC | PRN
  Administered 2021-09-30: 4 mg via INTRAVENOUS

## 2021-09-30 MED ORDER — LACTATED RINGERS IV SOLN: INTRAVENOUS | Status: DC

## 2021-09-30 MED ORDER — LIDOCAINE HCL (PF) 2 % IJ SOLN
INTRAMUSCULAR | Status: AC
  Filled 2021-09-30: qty 5

## 2021-09-30 MED ORDER — DIPHENHYDRAMINE HCL 50 MG/ML IJ SOLN
INTRAMUSCULAR | Status: AC
Start: 2021-09-30 — End: ?
  Filled 2021-09-30: qty 1

## 2021-09-30 MED ORDER — PROPOFOL 10 MG/ML IV BOLUS
INTRAVENOUS | Status: DC | PRN
  Administered 2021-09-30: 160 mg via INTRAVENOUS

## 2021-09-30 MED ORDER — INSULIN ASPART 100 UNIT/ML IJ SOLN
0.0000 [IU] | Freq: Three times a day (TID) | INTRAMUSCULAR | Status: DC
  Administered 2021-09-30 – 2021-10-01 (×2): 11 [IU] via SUBCUTANEOUS
  Administered 2021-10-01: 15 [IU] via SUBCUTANEOUS

## 2021-09-30 MED ORDER — ROCURONIUM BROMIDE 10 MG/ML (PF) SYRINGE
PREFILLED_SYRINGE | INTRAVENOUS | Status: AC
  Filled 2021-09-30: qty 10

## 2021-09-30 MED ORDER — HYDROCODONE-ACETAMINOPHEN 5-325 MG PO TABS: 1.0000 | ORAL_TABLET | ORAL | 0 refills | Status: DC | PRN

## 2021-09-30 MED ORDER — ACETAMINOPHEN 500 MG PO TABS
1000.0000 mg | ORAL_TABLET | Freq: Once | ORAL | Status: AC
  Administered 2021-09-30: 1000 mg via ORAL
  Filled 2021-09-30: qty 2

## 2021-09-30 MED ORDER — INSULIN ASPART 100 UNIT/ML IJ SOLN
0.0000 [IU] | Freq: Every day | INTRAMUSCULAR | Status: DC
  Administered 2021-09-30: 5 [IU] via SUBCUTANEOUS

## 2021-09-30 MED ORDER — GLIPIZIDE 10 MG PO TABS
10.0000 mg | ORAL_TABLET | Freq: Two times a day (BID) | ORAL | Status: DC
  Administered 2021-09-30 – 2021-10-01 (×2): 10 mg via ORAL
  Filled 2021-09-30 (×2): qty 1

## 2021-09-30 MED ORDER — PROPOFOL 10 MG/ML IV BOLUS
INTRAVENOUS | Status: AC
  Filled 2021-09-30: qty 20

## 2021-09-30 MED ORDER — FENTANYL CITRATE PF 50 MCG/ML IJ SOSY
PREFILLED_SYRINGE | INTRAMUSCULAR | Status: AC
  Filled 2021-09-30: qty 1

## 2021-09-30 MED ORDER — EPHEDRINE SULFATE-NACL 50-0.9 MG/10ML-% IV SOSY
PREFILLED_SYRINGE | INTRAVENOUS | Status: DC | PRN
  Administered 2021-09-30: 5 mg via INTRAVENOUS

## 2021-09-30 MED ORDER — ROSUVASTATIN CALCIUM 20 MG PO TABS
40.0000 mg | ORAL_TABLET | Freq: Every day | ORAL | Status: DC
  Administered 2021-10-01: 40 mg via ORAL
  Filled 2021-09-30: qty 2

## 2021-09-30 MED ORDER — MONTELUKAST SODIUM 10 MG PO TABS
10.0000 mg | ORAL_TABLET | Freq: Every day | ORAL | Status: DC
  Administered 2021-10-01: 10 mg via ORAL
  Filled 2021-09-30: qty 1

## 2021-09-30 MED ORDER — 0.9 % SODIUM CHLORIDE (POUR BTL) OPTIME
TOPICAL | Status: DC | PRN
  Administered 2021-09-30: 1000 mL

## 2021-09-30 MED ORDER — ONDANSETRON HCL 4 MG/2ML IJ SOLN
INTRAMUSCULAR | Status: AC
  Filled 2021-09-30: qty 2

## 2021-09-30 MED ORDER — GABAPENTIN 100 MG PO CAPS
100.0000 mg | ORAL_CAPSULE | Freq: Three times a day (TID) | ORAL | Status: DC
Start: 2021-09-30 — End: 2021-10-01
  Administered 2021-09-30 – 2021-10-01 (×4): 100 mg via ORAL
  Filled 2021-09-30 (×4): qty 1

## 2021-09-30 MED ORDER — IRBESARTAN 75 MG PO TABS
75.0000 mg | ORAL_TABLET | Freq: Every day | ORAL | Status: DC
  Administered 2021-09-30: 75 mg via ORAL
  Filled 2021-09-30 (×2): qty 1

## 2021-09-30 MED ORDER — LIDOCAINE 2% (20 MG/ML) 5 ML SYRINGE
INTRAMUSCULAR | Status: DC | PRN
  Administered 2021-09-30: 80 mg via INTRAVENOUS

## 2021-09-30 MED ORDER — TRAZODONE HCL 50 MG PO TABS: 50.0000 mg | ORAL_TABLET | Freq: Every evening | ORAL | Status: DC | PRN

## 2021-09-30 MED ORDER — CEFAZOLIN SODIUM-DEXTROSE 2-4 GM/100ML-% IV SOLN
2.0000 g | INTRAVENOUS | Status: AC
  Administered 2021-09-30: 2 g via INTRAVENOUS
  Filled 2021-09-30: qty 100

## 2021-09-30 MED ORDER — FENTANYL CITRATE (PF) 250 MCG/5ML IJ SOLN
INTRAMUSCULAR | Status: DC | PRN
  Administered 2021-09-30 (×2): 25 ug via INTRAVENOUS
  Administered 2021-09-30: 50 ug via INTRAVENOUS

## 2021-09-30 MED ORDER — DIPHENHYDRAMINE HCL 50 MG/ML IJ SOLN
INTRAMUSCULAR | Status: DC | PRN
  Administered 2021-09-30: 12.5 mg via INTRAVENOUS

## 2021-09-30 MED ORDER — FENTANYL CITRATE PF 50 MCG/ML IJ SOSY
25.0000 ug | PREFILLED_SYRINGE | INTRAMUSCULAR | Status: DC | PRN
  Administered 2021-09-30 (×2): 25 ug via INTRAVENOUS

## 2021-09-30 MED ORDER — CEPHALEXIN 250 MG PO CAPS: 250.0000 mg | ORAL_CAPSULE | Freq: Two times a day (BID) | ORAL | 0 refills | Status: AC

## 2021-09-30 MED ORDER — SEMAGLUTIDE 14 MG PO TABS: 14.0000 mg | ORAL_TABLET | Freq: Every day | ORAL | Status: DC

## 2021-09-30 MED ORDER — PROMETHAZINE HCL 25 MG/ML IJ SOLN: 6.2500 mg | INTRAMUSCULAR | Status: DC | PRN

## 2021-09-30 MED ORDER — CHLORHEXIDINE GLUCONATE 0.12 % MT SOLN
15.0000 mL | Freq: Once | OROMUCOSAL | Status: AC
  Administered 2021-09-30: 15 mL via OROMUCOSAL

## 2021-09-30 MED ORDER — DEXAMETHASONE SODIUM PHOSPHATE 10 MG/ML IJ SOLN
INTRAMUSCULAR | Status: AC
  Filled 2021-09-30: qty 1

## 2021-09-30 MED ORDER — ORAL CARE MOUTH RINSE: 15.0000 mL | Freq: Once | OROMUCOSAL | Status: AC

## 2021-09-30 MED ORDER — SODIUM CHLORIDE 0.9 % IR SOLN
Status: DC | PRN
  Administered 2021-09-30 (×4): 6000 mL

## 2021-09-30 MED ORDER — AMISULPRIDE (ANTIEMETIC) 5 MG/2ML IV SOLN: 10.0000 mg | Freq: Once | INTRAVENOUS | Status: DC | PRN

## 2021-09-30 MED ORDER — DEXAMETHASONE SODIUM PHOSPHATE 10 MG/ML IJ SOLN
INTRAMUSCULAR | Status: DC | PRN
  Administered 2021-09-30: 8 mg via INTRAVENOUS

## 2021-09-30 MED ORDER — METOPROLOL SUCCINATE ER 25 MG PO TB24
25.0000 mg | ORAL_TABLET | Freq: Every day | ORAL | Status: DC
  Administered 2021-10-01: 25 mg via ORAL
  Filled 2021-09-30: qty 1

## 2021-09-30 MED ORDER — FENTANYL CITRATE (PF) 100 MCG/2ML IJ SOLN
INTRAMUSCULAR | Status: AC
  Filled 2021-09-30: qty 2

## 2021-09-30 MED ORDER — ROCURONIUM BROMIDE 10 MG/ML (PF) SYRINGE
PREFILLED_SYRINGE | INTRAVENOUS | Status: DC | PRN
  Administered 2021-09-30: 60 mg via INTRAVENOUS

## 2021-09-30 MED ORDER — PANTOPRAZOLE SODIUM 40 MG PO TBEC
40.0000 mg | DELAYED_RELEASE_TABLET | Freq: Every day | ORAL | Status: DC
  Administered 2021-10-01: 40 mg via ORAL
  Filled 2021-09-30: qty 1

## 2021-09-30 MED ORDER — ALBUTEROL SULFATE (2.5 MG/3ML) 0.083% IN NEBU: 3.0000 mL | INHALATION_SOLUTION | RESPIRATORY_TRACT | Status: DC | PRN

## 2021-09-30 MED ORDER — DULOXETINE HCL 60 MG PO CPEP
60.0000 mg | ORAL_CAPSULE | Freq: Every day | ORAL | Status: DC
  Administered 2021-10-01: 60 mg via ORAL
  Filled 2021-09-30: qty 1

## 2021-09-30 SURGICAL SUPPLY — 34 items
BAG URINE DRAIN 2000ML AR STRL (UROLOGICAL SUPPLIES) ×3 IMPLANT
BAG URO CATCHER STRL LF (MISCELLANEOUS) ×3 IMPLANT
BALLN NEPHROSTOMY (BALLOONS)
BALLN OPTILUME DCB 30X5X75 (BALLOONS) ×3
BALLOON NEPHROSTOMY (BALLOONS) IMPLANT
BALLOON OPTILUME DCB 30X5X75 (BALLOONS) IMPLANT
CATH COUDE 22FR 5CC (CATHETERS) IMPLANT
CATH FOLEY 2W COUNCIL 20FR 5CC (CATHETERS) IMPLANT
CATH RIBBED COUDE 30CC (CATHETERS) ×1
CATH ROBINSON RED A/P 14FR (CATHETERS) ×2 IMPLANT
CATH URET 5FR 28IN CONE TIP (BALLOONS)
CATH URET 5FR 70CM CONE TIP (BALLOONS) IMPLANT
CATH URETL OPEN END 6FR 70 (CATHETERS) IMPLANT
CLOTH BEACON ORANGE TIMEOUT ST (SAFETY) ×3 IMPLANT
DEVICE INFLATION ATRION QL4015 (MISCELLANEOUS) ×1 IMPLANT
DRAPE FOOT SWITCH (DRAPES) ×3 IMPLANT
ELECT REM PT RETURN 15FT ADLT (MISCELLANEOUS) ×2 IMPLANT
GLOVE BIO SURGEON STRL SZ 6.5 (GLOVE) ×3 IMPLANT
GLOVE BIO SURGEON STRL SZ7.5 (GLOVE) ×3 IMPLANT
GOWN STRL REUS W/ TWL LRG LVL3 (GOWN DISPOSABLE) ×2 IMPLANT
GOWN STRL REUS W/TWL LRG LVL3 (GOWN DISPOSABLE) ×1
GUIDEWIRE ANG ZIPWIRE 038X150 (WIRE) IMPLANT
GUIDEWIRE STR DUAL SENSOR (WIRE) ×3 IMPLANT
KIT TURNOVER KIT A (KITS) IMPLANT
LOOP CUT BIPOLAR 24F LRG (ELECTROSURGICAL) ×1 IMPLANT
MANIFOLD NEPTUNE II (INSTRUMENTS) ×3 IMPLANT
NS IRRIG 1000ML POUR BTL (IV SOLUTION) IMPLANT
PACK CYSTO (CUSTOM PROCEDURE TRAY) ×3 IMPLANT
PENCIL SMOKE EVACUATOR (MISCELLANEOUS) IMPLANT
SYR TOOMEY IRRIG 70ML (MISCELLANEOUS) ×3
SYRINGE TOOMEY IRRIG 70ML (MISCELLANEOUS) IMPLANT
TUBING CONNECTING 10 (TUBING) ×3 IMPLANT
TUBING UROLOGY SET (TUBING) ×3 IMPLANT
WATER STERILE IRR 3000ML UROMA (IV SOLUTION) ×3 IMPLANT

## 2021-09-30 NOTE — Anesthesia Procedure Notes (Signed)
Procedure Name: Intubation Date/Time: 09/30/2021 7:59 AM Performed by: Veleria Barnhardt D, CRNA Pre-anesthesia Checklist: Patient identified, Emergency Drugs available, Suction available and Patient being monitored Patient Re-evaluated:Patient Re-evaluated prior to induction Oxygen Delivery Method: Circle system utilized Preoxygenation: Pre-oxygenation with 100% oxygen Induction Type: IV induction Ventilation: Mask ventilation without difficulty Laryngoscope Size: Mac and 4 Grade View: Grade II Tube type: Oral Tube size: 7.5 mm Number of attempts: 1 Airway Equipment and Method: Stylet Placement Confirmation: ETT inserted through vocal cords under direct vision, positive ETCO2 and breath sounds checked- equal and bilateral Secured at: 22 cm Tube secured with: Tape Dental Injury: Teeth and Oropharynx as per pre-operative assessment

## 2021-09-30 NOTE — Transfer of Care (Signed)
Immediate Anesthesia Transfer of Care Note  Patient: Eddie Graham  Procedure(s) Performed: TRANSURETHRAL RESECTION OF BLADDER TUMOR (TURBT) (Bladder) CYSTOSCOPY WITH  URETHRAL BALLOON  DILATATION (Urethra)  Patient Location: PACU  Anesthesia Type:General  Level of Consciousness: awake, alert  and oriented  Airway & Oxygen Therapy: Patient Spontanous Breathing and Patient connected to face mask oxygen  Post-op Assessment: Report given to RN and Post -op Vital signs reviewed and stable  Post vital signs: Reviewed and stable  Last Vitals:  Vitals Value Taken Time  BP 151/86 09/30/21 0920  Temp    Pulse 75 09/30/21 0922  Resp 16 09/30/21 0922  SpO2 100 % 09/30/21 0922  Vitals shown include unvalidated device data.  Last Pain:  Vitals:   09/30/21 0649  TempSrc:   PainSc: 10-Worst pain ever         Complications: No notable events documented.

## 2021-09-30 NOTE — Anesthesia Postprocedure Evaluation (Signed)
Anesthesia Post Note  Patient: Eddie Graham  Procedure(s) Performed: TRANSURETHRAL RESECTION OF BLADDER TUMOR (TURBT) (Bladder) CYSTOSCOPY WITH  URETHRAL BALLOON  DILATATION (Urethra)     Patient location during evaluation: PACU Anesthesia Type: General Level of consciousness: sedated Pain management: pain level controlled Vital Signs Assessment: post-procedure vital signs reviewed and stable Respiratory status: spontaneous breathing and respiratory function stable Cardiovascular status: stable Postop Assessment: no apparent nausea or vomiting Anesthetic complications: no   No notable events documented.  Last Vitals:  Vitals:   09/30/21 0930 09/30/21 0945  BP: (!) 124/48 (!) 114/51  Pulse: 73 69  Resp: 19 15  Temp:    SpO2: 99% 97%    Last Pain:  Vitals:   09/30/21 0945  TempSrc:   PainSc: Asleep                 [name]

## 2021-09-30 NOTE — Interval H&P Note (Signed)
History and Physical Interval Note: I discussed postoperative Foley catheter as well as other limitations.  Interpreter is present for this.  Risks and benefits were discussed with the patient and informed consent obtained.  09/30/2021 7:37 AM  Eddie Graham  has presented today for surgery, with the diagnosis of URETHRAL STRICTURE, BLADDER TUMOR.  The various methods of treatment have been discussed with the patient and family. After consideration of risks, benefits and other options for treatment, the patient has consented to  Procedure(s) with comments: TRANSURETHRAL RESECTION OF BLADDER TUMOR (TURBT) (N/A) - 90 MINS CYSTOSCOPY WITH  URETHRAL BALLOON  DILATATION (N/A) as a surgical intervention.  The patient's history has been reviewed, patient examined, no change in status, stable for surgery.  I have reviewed the patient's chart and labs.  Questions were answered to the patient's satisfaction.     Offie Waide D [name]

## 2021-09-30 NOTE — Discharge Instructions (Signed)

## 2021-09-30 NOTE — Plan of Care (Signed)
  Problem: Coping: Goal: Level of anxiety will decrease Outcome: Progressing   Problem: Pain Managment: Goal: General experience of comfort will improve Outcome: Progressing   Problem: Safety: Goal: Ability to remain free from injury will improve Outcome: Progressing   

## 2021-09-30 NOTE — H&P (Signed)
CC/HPI: cc: bladder tumor   08/04/21: 87 year old man who presented to the ED after a fall underwent a CT scan which showed concern for bladder tumor. Patient comes into the office today and has declined it interpreter phone. He states he has had bladder cancer in the past and had surveillance cystoscopies. I do not think his have this done in a long time. He denies any gross hematuria. He cannot tell me who his prior urologist was.   Per care everywhere in Epic, pt last saw Dr. Amalia Ha[facility] at [facility] in July 2020  Pt s/p TURBT 02-15-18 with foley placed at the end of the case. He passed voiding trial. Path was Ta LG.   09/24/2021: 87 year old man who presents for preoperative appointment prior to undergoing urethral dilation, TURBT. He reports no changes to his medications or his health history. He has no questions regarding the upcoming procedure. He denies recent fevers, chills, gross hematuria or difficulty voiding.     ALLERGIES: No Known Allergies    MEDICATIONS: Plavix 75 mg tablet  Insulin Aspart  Tylenol     GU PSH: Cystoscopy - 08/04/2021     NON-GU PSH: No Non-GU PSH    GU PMH: Bladder tumor/neoplasm - 08/04/2021 Bulbar urethral stricture - 08/04/2021      PMH Notes: Diabetes   NON-GU PMH: Arthritis Asthma Hypertension    FAMILY HISTORY: No Family History    SOCIAL HISTORY: Marital Status: Married Preferred Language: Spanish; Castilian; Ethnicity:  Current Smoking Status: Patient does not smoke anymore.   Tobacco Use Assessment Completed: Used Tobacco in last 30 days? Drinks 2 caffeinated drinks per day.    REVIEW OF SYSTEMS:    GU Review Male:   Patient denies trouble starting your stream, hard to postpone urination, get up at night to urinate, erection problems, leakage of urine, frequent urination, have to strain to urinate , penile pain, stream starts and stops, and burning/ pain with urination.  Gastrointestinal (Upper):   Patient denies nausea, vomiting, and  indigestion/ heartburn.  Gastrointestinal (Lower):   Patient denies diarrhea and constipation.  Constitutional:   Patient denies fever, night sweats, weight loss, and fatigue.  Skin:   Patient denies skin rash/ lesion and itching.  Hematologic/Lymphatic:   Patient denies swollen glands and easy bruising.  Cardiovascular:   Patient denies leg swelling and chest pains.  Respiratory:   Patient denies cough and shortness of breath.  Musculoskeletal:   Patient denies back pain and joint pain.  Neurological:   Patient denies headaches and dizziness.  Psychologic:   Patient denies depression and anxiety.   VITAL SIGNS:      09/24/2021 11:29 AM  BP 111/69 mmHg  Pulse 81 /min  Temperature 97.7 F / 36.5 C   MULTI-SYSTEM PHYSICAL EXAMINATION:    Constitutional: Well-nourished. No physical deformities. Normally developed. Good grooming.  Neck: Neck symmetrical, not swollen. Normal tracheal position.  Respiratory: Normal breath sounds. No labored breathing, no use of accessory muscles.   Cardiovascular: Regular rate and rhythm. No murmur, no gallop. Normal temperature, normal extremity pulses, no swelling, no varicosities.   Lymphatic: No enlargement of neck, axillae, groin.  Skin: No paleness, no jaundice, no cyanosis. No lesion, no ulcer, no rash.  Neurologic / Psychiatric: Oriented to time, oriented to place, oriented to person. No depression, no anxiety, no agitation.  Gastrointestinal: No mass, no tenderness, no rigidity, non obese abdomen.  Musculoskeletal: Normal gait and station of head and neck.     Complexity of Data:  Source  Of History:  Patient  Records Review:   Previous Doctor Records, Previous Patient Records  Urine Test Review:   Urinalysis   09/24/21  Urinalysis  Urine Appearance Clear   Urine Color Yellow   Urine Glucose 3+ mg/dL  Urine Bilirubin Neg mg/dL  Urine Ketones Neg mg/dL  Urine Specific Gravity 1.015   Urine Blood Neg ery/uL  Urine pH <=5.0   Urine Protein  Neg mg/dL  Urine Urobilinogen 0.2 mg/dL  Urine Nitrites Neg   Urine Leukocyte Esterase Neg leu/uL   PROCEDURES:          Urinalysis Dipstick Dipstick Cont'd  Color: Yellow Bilirubin: Neg mg/dL  Appearance: Clear Ketones: Neg mg/dL  Specific Gravity: 1.015 Blood: Neg ery/uL  pH: <=5.0 Protein: Neg mg/dL  Glucose: 3+ mg/dL Urobilinogen: 0.2 mg/dL    Nitrites: Neg    Leukocyte Esterase: Neg leu/uL    ASSESSMENT:      ICD-10 Details  1 GU:   Bladder tumor/neoplasm - D41.4 Chronic, Stable  2   Bulbar urethral stricture - N35.011 Chronic, Stable   PLAN:           Document Letter(s):  Created for Patient: Clinical Summary         Notes:   Urinalysis is clear today. He will keep upcoming surgery as scheduled on 09/30/2021. I did advise if he develops any fevers, chills, shortness of breath, chest pain he should notify her clinic immediately. All of his questions were answered to the best of my ability.

## 2021-09-30 NOTE — Op Note (Signed)
PATIENT:  Eddie Graham  PRE-OPERATIVE DIAGNOSIS: Bladder tumor  POST-OPERATIVE DIAGNOSIS: Same  PROCEDURE:  Procedure(s): 1. TRANSURETHRAL RESECTION OF BLADDER TUMOR (TURBT) (>5cm.) 2.  Dilation of urethral meatus with sounds from 24 [place] to 30 [place] 3.  Opitlume bulbar urethral stricture dilation  SURGEON:  Jacalyn Lefevre, MD  ANESTHESIA:   General  EBL:  less than 50 mL  DRAINS: Urethral catheter (22 Fr. Coude Foley)   SPECIMEN:  Bladder tumor  DISPOSITION OF SPECIMEN:  PATHOLOGY  Indication: 87 year old man with a history of low-grade TA bladder cancer initially diagnosed at [facility] in October 2019 was then lost to follow-up.  He presented to the office with gross hematuria and a CT scan concerning for bladder tumor.  Cystoscopy in the office revealed a bulbar urethral stricture and examination of the bladder was not possible.  Description of operation: The patient was taken to the operating room and administered general anesthesia. They were then placed on the table and moved to the dorsal lithotomy position after which the genitalia was sterilely prepped and draped. An official timeout was then performed.  A 21 French rigid cystoscope was placed in the urethral meatus and advanced into the urethra until the previously noted bulbar urethral stricture was seen.  It was noted to be approximately 5 [place] in size.  A 0.38 sensor wire was then used to cannulate this area and advanced the bladder.  This was confirmed with fluoroscopy.  The Optilume urethral balloon dilator was then advanced over the wire and spanned the bulbar urethral stricture.  It was then inflated to 12 mmHg.  After it was held in place for 1 minute it was deflated and removed.  Next sounds were used to dilate the urethral meatus from 24 [place] to 30 [place].  The 73 French resectoscope with the 30 lens and visual obturator were then passed into the bladder under direct visualization. Urethra  appeared normal. The visual obturator was then removed and the Gyrus resectoscope element with 30  lens was then inserted and the bladder was fully and systematically inspected. Ureteral orifices were noted to be in the normal anatomic positions.   There was a large papillary bladder mass at the right superior posterior bladder wall.  Gentle abdominal pressure was used to better access this area.  The resectoscope was helped for much of the case and movement was difficult due to the stricture.  The bladder mass was resected using the bipolar electrocautery loop.  Reinspection of the bladder revealed all obvious tumor had been fully resected and there was no evidence of perforation. The Toomey syringe was then used to irrigate the bladder and remove all of the portions of bladder tumor which were sent to pathology. I then removed the resectoscope.  A 22 French Foley coud catheter was then inserted in the bladder and irrigated. The irrigant returned slightly pink with no clots. The patient was awakened and taken to the recovery room.  The patient will be discharged home with a Foley catheter in place.   PLAN OF CARE: Discharge to home after PACU  PATIENT DISPOSITION:  PACU - hemodynamically stable.

## 2021-09-30 NOTE — Plan of Care (Signed)
  Problem: Coping: Goal: Ability to adjust to condition or change in health will improve Outcome: Progressing   Problem: Clinical Measurements: Goal: Diagnostic test results will improve Outcome: Progressing   Problem: Elimination: Goal: Will not experience complications related to urinary retention Outcome: Progressing

## 2021-10-01 ENCOUNTER — Encounter (HOSPITAL_COMMUNITY): Payer: Self-pay | Admitting: Urology

## 2021-10-01 DIAGNOSIS — C679 Malignant neoplasm of bladder, unspecified: Secondary | ICD-10-CM | POA: Diagnosis not present

## 2021-10-01 LAB — GLUCOSE, CAPILLARY
Glucose-Capillary: 311 mg/dL — ABNORMAL HIGH (ref 70–99)
Glucose-Capillary: 372 mg/dL — ABNORMAL HIGH (ref 70–99)

## 2021-10-01 MED ORDER — OXYCODONE-ACETAMINOPHEN 5-325 MG PO TABS
1.0000 | ORAL_TABLET | Freq: Four times a day (QID) | ORAL | Status: DC | PRN
  Administered 2021-10-01 (×2): 1 via ORAL
  Filled 2021-10-01 (×2): qty 1

## 2021-10-01 NOTE — Progress Notes (Signed)
  Transition of Care [facility]) Screening Note   Patient Details  Name: Eddie Graham Date of Birth: [birth year]   Transition of Care [facility]) CM/SW Contact:    Dessa Phi, RN Phone Number: 10/01/2021, 10:38 AM    Transition of Care Department [facility]) has reviewed patient and no TOC needs have been identified at this time. We will continue to monitor patient advancement through interdisciplinary progression rounds. If new patient transition needs arise, please place a TOC consult.

## 2021-10-01 NOTE — Discharge Summary (Signed)
Date of admission: 09/30/2021  Date of discharge: 10/01/2021  Admission diagnosis: bladder mass, bulbar urethral stricture  Discharge diagnosis: same  Secondary diagnoses:  Patient Active Problem List   Diagnosis Date Noted   Bladder mass 09/30/2021   AAA (abdominal aortic aneurysm) without rupture ([place]) 08/05/2021    Procedures performed: Procedure(s): TRANSURETHRAL RESECTION OF BLADDER TUMOR (TURBT) CYSTOSCOPY WITH  URETHRAL BALLOON  DILATATION  History and Physical: For full details, please see admission history and physical. Briefly, Eddie Graham is a 87 y.o. year old patient with gross hematuria found to have bladder mass on imaging as well as bulbar urethral stricture.   Interpreter video was used for history and exam this AM Patient doing well. No issue with foley overnight.  Experiencing some pain from surgery. Physical exam NAD, alert and oriented 22Fr coude draining pink tinged urine with small clot in tubing   Hospital Course: Patient tolerated the procedure well.  He was kept overnight because he did not have anyone to stay with him at home after surgery. He was then transferred to the floor after an uneventful PACU stay.  His hospital course was uncomplicated.  On POD#1 he had met discharge criteria: was eating a regular diet, was up and ambulating independently,  pain was well controlled, catheter was draining well, and was ready to for discharge.   Laboratory values:  No results for input(s): WBC, HGB, HCT in the last 72 hours. No results for input(s): NA, K, CL, CO2, GLUCOSE, BUN, CREATININE, CALCIUM in the last 72 hours. No results for input(s): LABPT, INR in the last 72 hours. No results for input(s): LABURIN in the last 72 hours. No results found for this or any previous visit.  Disposition: Home  Discharge instruction: The patient was instructed to be ambulatory but told to refrain from heavy lifting, strenuous activity, or driving.   Discharge  medications:  Allergies as of 10/01/2021   No Known Allergies      Medication List     TAKE these medications    albuterol 108 (90 Base) MCG/ACT inhaler Commonly known as: VENTOLIN HFA Inhale 2 puffs into the lungs every 4 (four) hours as needed for wheezing.   cephALEXin 250 MG capsule Commonly known as: KEFLEX Take 1 capsule (250 mg total) by mouth 2 (two) times daily for 7 days.   clopidogrel 75 MG tablet Commonly known as: PLAVIX Take 75 mg by mouth daily.   DULoxetine 60 MG capsule Commonly known as: CYMBALTA Take 60 mg by mouth daily.   gabapentin 100 MG capsule Commonly known as: NEURONTIN Take 100 mg by mouth 3 (three) times daily.   glipiZIDE 10 MG tablet Commonly known as: GLUCOTROL Take 10 mg by mouth 2 (two) times daily.   HYDROcodone-acetaminophen 5-325 MG tablet Commonly known as: NORCO/VICODIN Take 1 tablet by mouth every 4 (four) hours as needed for moderate pain.   Lantus SoloStar 100 UNIT/ML Solostar Pen Generic drug: insulin glargine Inject 10-15 Units into the skin at bedtime.   metoprolol succinate 25 MG 24 hr tablet Commonly known as: TOPROL-XL Take 25 mg by mouth daily.   montelukast 10 MG tablet Commonly known as: SINGULAIR Take 10 mg by mouth daily.   pantoprazole 40 MG tablet Commonly known as: PROTONIX Take 40 mg by mouth daily.   rosuvastatin 40 MG tablet Commonly known as: CRESTOR Take 40 mg by mouth daily.   Semaglutide 14 MG Tabs Take 14 mg by mouth daily.   traZODone 50 MG tablet Commonly known  as: DESYREL Take 50 mg by mouth at bedtime as needed for sleep.   valsartan 80 MG tablet Commonly known as: DIOVAN Take 80 mg by mouth daily.        Followup:   Follow-up Information     [facility] Follow up on 10/08/2021.   Why: 9:30am Contact information: [place] [address] [phone]

## 2021-10-02 LAB — SURGICAL PATHOLOGY

## 2021-10-31 ENCOUNTER — Emergency Department (HOSPITAL_COMMUNITY)
Admission: EM | Admit: 2021-10-31 | Discharge: 2021-11-01 | Disposition: A | Discharge status: Home / Self Care | Payer: Medicare Other | Attending: Emergency Medicine | Admitting: Emergency Medicine

## 2021-10-31 ENCOUNTER — Encounter (HOSPITAL_COMMUNITY): Payer: Self-pay

## 2021-10-31 ENCOUNTER — Emergency Department (HOSPITAL_COMMUNITY): Payer: Medicare Other

## 2021-10-31 DIAGNOSIS — E1165 Type 2 diabetes mellitus with hyperglycemia: Secondary | ICD-10-CM | POA: Diagnosis not present

## 2021-10-31 DIAGNOSIS — Z7902 Long term (current) use of antithrombotics/antiplatelets: Secondary | ICD-10-CM | POA: Diagnosis not present

## 2021-10-31 DIAGNOSIS — R7989 Other specified abnormal findings of blood chemistry: Secondary | ICD-10-CM | POA: Insufficient documentation

## 2021-10-31 DIAGNOSIS — M5442 Lumbago with sciatica, left side: Secondary | ICD-10-CM | POA: Diagnosis not present

## 2021-10-31 DIAGNOSIS — M545 Low back pain, unspecified: Secondary | ICD-10-CM | POA: Diagnosis present

## 2021-10-31 DIAGNOSIS — Z7984 Long term (current) use of oral hypoglycemic drugs: Secondary | ICD-10-CM | POA: Diagnosis not present

## 2021-10-31 DIAGNOSIS — M5441 Lumbago with sciatica, right side: Secondary | ICD-10-CM | POA: Diagnosis not present

## 2021-10-31 DIAGNOSIS — R739 Hyperglycemia, unspecified: Secondary | ICD-10-CM

## 2021-10-31 LAB — URINALYSIS, ROUTINE W REFLEX MICROSCOPIC
Bacteria, UA: NONE SEEN
Bilirubin Urine: NEGATIVE
Glucose, UA: >=500 mg/dL — AB
Hgb urine dipstick: NEGATIVE
Ketones, ur: NEGATIVE mg/dL
Leukocytes,Ua: NEGATIVE
Nitrite: NEGATIVE
Protein, ur: NEGATIVE mg/dL
Specific Gravity, Urine: 1.024 (ref 1.005–1.030)
pH: 6 (ref 5.0–8.0)

## 2021-10-31 LAB — BASIC METABOLIC PANEL
Anion gap: 8 (ref 5–15)
BUN: 31 mg/dL — ABNORMAL HIGH (ref 8–23)
CO2: 25 mmol/L (ref 22–32)
Calcium: 8.8 mg/dL — ABNORMAL LOW (ref 8.9–10.3)
Chloride: 98 mmol/L (ref 98–111)
Creatinine, Ser: 1.42 mg/dL — ABNORMAL HIGH (ref 0.61–1.24)
GFR, Estimated: 49 mL/min — ABNORMAL LOW (ref >60)
Glucose, Bld: 574 mg/dL (ref 70–99)
Potassium: 4.5 mmol/L (ref 3.5–5.1)
Sodium: 131 mmol/L — ABNORMAL LOW (ref 135–145)

## 2021-10-31 LAB — CBC
HCT: 42.1 % (ref 39.0–52.0)
Hemoglobin: 13.6 g/dL (ref 13.0–17.0)
MCH: 27.6 pg (ref 26.0–34.0)
MCHC: 32.3 g/dL (ref 30.0–36.0)
MCV: 85.6 fL (ref 80.0–100.0)
Platelets: 246 10*3/uL (ref 150–400)
RBC: 4.92 MIL/uL (ref 4.22–5.81)
RDW: 13.5 % (ref 11.5–15.5)
WBC: 8.9 10*3/uL (ref 4.0–10.5)
nRBC: 0 % (ref 0.0–0.2)

## 2021-10-31 LAB — CBG MONITORING, ED
Glucose-Capillary: >600 mg/dL (ref 70–99)
Glucose-Capillary: 457 mg/dL — ABNORMAL HIGH (ref 70–99)
Glucose-Capillary: 317 mg/dL — ABNORMAL HIGH (ref 70–99)

## 2021-10-31 NOTE — ED Triage Notes (Addendum)
Pt arrived via [facility] from home.  Pt speaks spanish and some [place].   C/o lower back pain that is radiating down both legs.   Pt states trouble walking. Pt was ambulatory with ems. EMS states pt had no issues ambulating at home and to ambulance.  Welfare check was performed on him today per PCP.   Pt is non compliant with DM medications.   A1c-11.9 per ems    Last CBG at PCP was 613.  Per EMS hyperglycemia is patients baseline.    CBG with EMS- HIGH    BP-124/60 HR-89 95% RA   Hx: Lumbar disease

## 2021-10-31 NOTE — ED Provider Notes (Signed)
[facility] DEPT Provider Note   CSN: [id number] Arrival date & time: 10/31/21  1329     History  Chief Complaint  Patient presents with   Back Pain   Hyperglycemia    Eddie Graham is a 87 y.o. male with a past medical history of diabetes who presents to the emergency department complaining of back pain onset 3 days.  Notes he has lower back pain that radiates down the posterior aspect of both of his legs.  Denies history similar symptoms.  No recent fall, injury, trauma.  No meds tried prior to arrival.  Denies fever, urinary symptoms, bowel/bladder incontinence, gait problem.  Patient is he is compliant with his medications and his last dose of his insulin was yesterday.   The history is provided by the patient. A language interpreter was used ([place]).       Home Medications Prior to Admission medications   Medication Sig Start Date End Date Taking? Authorizing Provider  albuterol (VENTOLIN HFA) 108 (90 Base) MCG/ACT inhaler Inhale 2 puffs into the lungs every 4 (four) hours as needed for wheezing. 11/11/20   [provider]  clopidogrel (PLAVIX) 75 MG tablet Take 75 mg by mouth daily. 01/24/21   [provider]  DULoxetine (CYMBALTA) 60 MG capsule Take 60 mg by mouth daily. 02/07/21   [provider]  gabapentin (NEURONTIN) 100 MG capsule Take 100 mg by mouth 3 (three) times daily. 02/25/21   [provider]  glipiZIDE (GLUCOTROL) 10 MG tablet Take 10 mg by mouth 2 (two) times daily. 03/25/21   [provider]  HYDROcodone-acetaminophen (NORCO/VICODIN) 5-325 MG tablet Take 1 tablet by mouth every 4 (four) hours as needed for moderate pain. 09/30/21 09/30/22  Robley Fries, MD  LANTUS SOLOSTAR 100 UNIT/ML Solostar Pen Inject 10-15 Units into the skin at bedtime. 09/07/21   [provider]  metoprolol succinate (TOPROL-XL) 25 MG 24 hr tablet Take 25 mg by mouth daily. 04/07/21   [provider]  montelukast (SINGULAIR) 10 MG tablet Take 10 mg by mouth daily. 03/19/21   [provider]  pantoprazole (PROTONIX) 40 MG tablet Take 40 mg by mouth daily. 02/14/21   [provider]  rosuvastatin (CRESTOR) 40 MG tablet Take 40 mg by mouth daily. 04/07/21   [provider]  Semaglutide 14 MG TABS Take 14 mg by mouth daily. 03/14/21   [provider]  traZODone (DESYREL) 50 MG tablet Take 50 mg by mouth at bedtime as needed for sleep. 03/14/21   [provider]  valsartan (DIOVAN) 80 MG tablet Take 80 mg by mouth daily. 03/14/21   [provider]      Allergies    Patient has no known allergies.    Review of Systems   Review of Systems  Constitutional:  Negative for fever.  Gastrointestinal:  Negative for nausea and vomiting.       -bowel incontinence  Genitourinary:  Negative for dysuria and hematuria.       -bladder incontinence  Musculoskeletal:  Positive for back pain. Negative for gait problem.  Skin:  Negative for color change and wound.  Neurological:  Negative for weakness and numbness.       -tingling  All other systems reviewed and are negative.   Physical Exam Updated Vital Signs BP 118/68   Pulse 74   Temp 98 F (36.7 C) (Oral)   Resp 15   SpO2 97%  Physical Exam Vitals and nursing note reviewed.  Constitutional:      General: He is not in acute distress.    Appearance: He is not diaphoretic.  HENT:     Head: Normocephalic and atraumatic.     Mouth/Throat:     Pharynx: No oropharyngeal exudate.  Eyes:     General: No scleral icterus.    Conjunctiva/sclera: Conjunctivae normal.  Cardiovascular:     Rate and Rhythm: Normal rate and regular rhythm.     Pulses: Normal pulses.     Heart sounds: Normal heart sounds.  Pulmonary:     Effort: Pulmonary effort is normal. No respiratory distress.     Breath sounds: Normal breath sounds. No wheezing.  Abdominal:     General: Bowel sounds are  normal.     Palpations: Abdomen is soft. There is no mass.     Tenderness: There is no abdominal tenderness. There is no guarding or rebound.  Musculoskeletal:        General: Normal range of motion.     Cervical back: Normal range of motion and neck supple.     Comments: Patient able to ambulate without assistance or difficulty with cane.  No spinal tenderness to palpation.  No tenderness to palpation noted to musculature of spine.  No overlying skin changes.  Positive straight leg raise noted bilaterally.  Sensation intact to bilateral upper and lower extremities.  Skin:    General: Skin is warm and dry.  Neurological:     Mental Status: He is alert.  Psychiatric:        Behavior: Behavior normal.     ED Results / Procedures / Treatments   Labs (all labs ordered are listed, but only abnormal results are displayed) Labs Reviewed  BASIC METABOLIC PANEL - Abnormal; Notable for the following components:      Result Value   Sodium 131 (*)    Glucose, Bld 574 (*)    BUN 31 (*)    Creatinine, Ser 1.42 (*)    Calcium 8.8 (*)    GFR, Estimated 49 (*)    All other components within normal limits  URINALYSIS, ROUTINE W REFLEX MICROSCOPIC - Abnormal; Notable for the following components:   Color, Urine STRAW (*)    Glucose, UA >=500 (*)    All other components within normal limits  CBG MONITORING, ED - Abnormal; Notable for the following components:   Glucose-Capillary >600 (*)    All other components within normal limits  CBG MONITORING, ED - Abnormal; Notable for the following components:   Glucose-Capillary 457 (*)    All other components within normal limits  CBG MONITORING, ED - Abnormal; Notable for the following components:   Glucose-Capillary 317 (*)    All other components within normal limits  CBC    EKG None  Radiology DG Lumbar Spine Complete  Result Date: 10/31/2021 CLINICAL DATA:  Pain EXAM: LUMBAR SPINE - COMPLETE 4+ VIEW COMPARISON:  CT 09/01/2021. FINDINGS:  Slight levoconvex curvature. There are 5 non-rib-bearing lumbar vertebrae. There is unchanged mild anterior physiologic wedging at T12 and L1. There is thin linear sclerosis along the upper aspect of the L2 vertebral body without significant height loss. Multilevel degenerative disc disease, severe at L5-S1. There is moderate severe lower lumbar predominant facet arthropathy. There is mild retrolisthesis at L3-L4 and L2-L3. vascular calcifications. IMPRESSION: Thin linear sclerosis across the upper aspect of the L2 vertebral body without significant height loss, could represent a mild compression fracture. Multilevel degenerative disc disease, severe at L5-S1. Moderate to severe  lower lumbar predominant facet arthropathy. Mild retrolisthesis at L2-L3 and L3-L4. Electronically Signed   By: Maurine Simmering M.D.   On: 10/31/2021 15:07    Procedures Procedures    Medications Ordered in ED Medications - No data to display  ED Course/ Medical Decision Making/ A&P Clinical Course as of 11/01/21 0727  Sat Nov 01, 2021  0124 Patient reevaluated resting comfortably on stretcher.  Noted that pain improved with treatment regimen in the ED.  Discussed discharge treatment plan with patient at bedside.  Patient appears safe for discharge at this time.   [SB]    Clinical Course User Index [SB] Tranae Laramie A, PA-C                           Medical Decision Making Amount and/or Complexity of Data Reviewed Labs: ordered.  Risk Prescription drug management.   Patient with chronic lumbar back pain with radiation to bilateral posterior legs. No neurological deficits and normal neuro exam.  Patient is ambulatory without assistance or difficulty. {Patient is wheelchair bound at baseline.}  No loss of bowel or bladder control.  No concern for cauda equina.  No fever, night sweats, weight loss, h/o cancer, IVDA, no recent procedure to back. No urinary symptoms suggestive of UTI.  Vital signs, ***. On exam, patient  with Patient able to ambulate without assistance or difficulty with cane.  No spinal tenderness to palpation.  No tenderness to palpation noted to musculature of spine.  No overlying skin changes.  Positive straight leg raise noted bilaterally.  Sensation intact to bilateral upper and lower extremities.   . No concerning findings on exams. Differential diagnosis includes but is not limited to fracture, herniation, muscle strain, cauda equina, pancreatitis, appendicitis, pyelonephritis, nephrolithiasis.    Labs:  I ordered, and personally interpreted labs.  The pertinent results include:   Urinalysis with greater than 500 glucose otherwise unremarkable. BMP with slightly decreased sodium at 131, elevated glucose at 574, creatinine elevated at 1.42, BUN elevated at 31, GFR decreased at 49 (however stable from previous values), otherwise unremarkable  Imaging: I ordered imaging studies including lumbar spine x-ray I independently visualized and interpreted imaging which showed:  Thin linear sclerosis across the upper aspect of the L2 vertebral  body without significant height loss, could represent a mild  compression fracture.  Multilevel degenerative disc disease, severe at L5-S1. Moderate to  severe lower lumbar predominant facet arthropathy.  Mild retrolisthesis at L2-L3 and L3-L4.   I agree with the radiologist interpretation  Medications:  I ordered medication including fentanyl, Lidoderm patch for pain management Reevaluation of the patient after these medicines and interventions, I reevaluated the patient and found that they have improved I have reviewed the patients home medicines and have made adjustments as needed   Disposition: {End of MDM here with the likely diagnosis}. After consideration of the diagnostic results and the patients response to treatment, I feel that the patient would benefit from Discharge home.  Patient will be sent a prescription for Lidoderm patch.  Also  instructed patient to follow-up with his primary care provider as well as continue taking his medications as prescribed.  Supportive care measures and strict return precautions discussed with patient at bedside. Pt acknowledges and verbalizes understanding. Pt appears safe for discharge. Follow up as indicated in discharge paperwork.    This chart was dictated using voice recognition software, Dragon. Despite the best efforts of this provider to proofread and correct  errors, errors may still occur which can change documentation meaning.  Final Clinical Impression(s) / ED Diagnoses Final diagnoses:  Acute low back pain with bilateral sciatica, unspecified back pain laterality  Hyperglycemia    Rx / DC Orders ED Discharge Orders          Ordered    lidocaine (LIDODERM) 5 %  Every 24 hours        11/01/21 0213

## 2021-10-31 NOTE — ED Provider Triage Note (Signed)
Emergency Medicine Provider Triage Evaluation Note  Thao Vanover , a 87 y.o. male  was evaluated in triage.  Pt complains of back pain.  Patient states that he has had worsening back pain over the past 2 to 4 days.  Describes the pain as shooting down bilateral legs.  He states that he has had to walk with a cane more frequently recently secondary to feel like he has to fall because of the pain.  Denies fever, chills, night sweats, chest pain, shortness of breath, abdominal pain, nausea/vomiting/diarrhea, urinary symptoms, change in bowel habits.  Denies history of IV drug use, saddle anesthesia, bowel/bladder dysfunction, weakness/sensory deficits in lower extremities..  Review of Systems  Positive: See above Negative:   Physical Exam  BP 127/63 (BP Location: Left Arm)   Pulse 81   Temp 98.4 F (36.9 C) (Oral)   Resp 18   SpO2 94%  Gen:   Awake, no distress   Resp:  Normal effort  MSK:   Moves extremities without difficulty  Other:  Midline tender to palpation of lumbar spine.  Medical Decision Making  Medically screening exam initiated at 2:32 PM.  Appropriate orders placed.  Pietro Sanchez-Feliciano was informed that the remainder of the evaluation will be completed by another provider, this initial triage assessment does not replace that evaluation, and the importance of remaining in the ED until their evaluation is complete.     Wilnette Kales, [place] 10/31/21 1433

## 2021-10-31 NOTE — Progress Notes (Signed)
Inpatient Diabetes Program Recommendations  AACE/ADA: New Consensus Statement on Inpatient Glycemic Control (2015)  Target Ranges:  Prepandial:   less than 140 mg/dL      Peak postprandial:   less than 180 mg/dL (1-2 hours)      Critically ill patients:  140 - 180 mg/dL   Lab Results  Component Value Date   GLUCAP >600 (HH) 10/31/2021   HGBA1C 13.2 (H) 09/30/2021    Review of Glycemic Control  Diabetes history: DM2 Outpatient Diabetes medications: Lantus 10-15 units QHS, glipizide 10 mg BID, Rybelsus 14 mg QD Current orders for Inpatient glycemic control: None  HgbA1C - 13.2% Not in DKA  Inpatient Diabetes Program Recommendations:    IV insulin per [employer] for hyperglycemia  See regarding hgbA1C of 13.2% prior to discharge.  Thank you. Lorenda Peck, RD, LDN, CDE Inpatient Diabetes Coordinator [phone]

## 2021-11-01 DIAGNOSIS — M5441 Lumbago with sciatica, right side: Secondary | ICD-10-CM | POA: Diagnosis not present

## 2021-11-01 MED ORDER — LIDOCAINE 5 % EX PTCH
1.0000 | MEDICATED_PATCH | CUTANEOUS | 0 refills | Status: DC
Start: 2021-11-01 — End: 2023-02-26

## 2021-11-01 MED ORDER — FENTANYL CITRATE PF 50 MCG/ML IJ SOSY
50.0000 ug | PREFILLED_SYRINGE | Freq: Once | INTRAMUSCULAR | Status: AC
  Administered 2021-11-01: 50 ug via INTRAMUSCULAR
  Filled 2021-11-01: qty 1

## 2021-11-01 MED ORDER — OXYCODONE-ACETAMINOPHEN 5-325 MG PO TABS: 1.0000 | ORAL_TABLET | Freq: Once | ORAL | Status: DC

## 2021-11-01 MED ORDER — LIDOCAINE 5 % EX PTCH
1.0000 | MEDICATED_PATCH | Freq: Once | CUTANEOUS | Status: DC
  Administered 2021-11-01: 1 via TRANSDERMAL
  Filled 2021-11-01: qty 1

## 2021-11-01 NOTE — Discharge Instructions (Addendum)
It was a pleasure taking care of you today!   Your glucose (sugar) was elevated today in the ED.  It is important that you take your medications for diabetes as prescribed.  Call your primary care provider to set up a follow-up appointment regarding today's ED visit.  Attached is information for the on-call orthopedist (bone doctor) call on Monday, 11/03/2021 to set up a follow-up appoint regarding today's ED visit.  He will be sent a prescription for Lidoderm patch, use as directed.  Return to the emergency department if experiencing increasing/worsening lightheadedness, dizziness, worsening back pain, urinating or having a bowel movement yourself or not being aware, or worsening symptoms.  Fue un placer atenderte hoy!  Su glucosa (azcar) estuvo elevada hoy en el servicio de urgencias. Es importante que tome sus medicamentos para la diabetes segn lo prescrito. Llame a su proveedor de atencin primaria para programar una cita de seguimiento con respecto a la visita al servicio de urgencias de hoy. Se adjunta informacin para la llamada del ortopedista (mdico de [place]) de guardia el lunes 11/03/2021 para programar una cita de seguimiento con respecto a la visita al servicio de urgencias de hoy. Se le enviar una receta para el parche Lidoderm, utilcelo segn las indicaciones. Regrese a la sala de emergencias si experimenta un aumento o empeoramiento del aturdimiento, Tree surgeon, empeoramiento del dolor de [place], Garment/textile technologist o defecar usted mismo o no darse cuenta, o empeoramiento de los sntomas.

## 2021-11-21 ENCOUNTER — Other Ambulatory Visit: Payer: Self-pay | Admitting: Urology

## 2021-12-04 NOTE — Patient Instructions (Addendum)
DUE TO COVID-19 ONLY TWO VISITORS  (aged 85 and older)  ARE ALLOWED TO COME WITH YOU AND STAY IN THE WAITING ROOM ONLY DURING PRE OP AND PROCEDURE.   **NO VISITORS ARE ALLOWED IN THE SHORT STAY AREA OR RECOVERY ROOM!!**  IF YOU WILL BE ADMITTED INTO THE HOSPITAL YOU ARE ALLOWED ONLY FOUR SUPPORT PEOPLE DURING VISITATION HOURS ONLY (7 AM -8PM)   The support person(s) must pass our screening, gel in and out, and wear a mask at all times, including in the patient's room. Patients must also wear a mask when staff or their support person are in the room. Visitors GUEST BADGE MUST BE WORN VISIBLY  One adult visitor may remain with you overnight and MUST be in the room by 8 P.M.     Your procedure is scheduled on: 12/09/21   Report to [facility] Main Entrance    Report to admitting at 5:30  AM   Call this number if you have problems the morning of surgery [phone]   Do not eat food  or drink:After Midnight.                                             If you have questions, please contact your surgeon's office.    Oral Hygiene is also important to reduce your risk of infection.                                    Remember - BRUSH YOUR TEETH THE MORNING OF SURGERY WITH YOUR REGULAR TOOTHPASTE   Do NOT smoke after Midnight   Before surgery.Stop taking __Plavix_________on _8/9/23_________as instructed by _____________.       Take these medicines the morning of surgery with A SIP OF WATER: Gabapentin, Duloxetine, Crestor, Metoprolol, Pantoprazole  Use your inhaler and bring it with you.    How to Manage Your Diabetes Before and After Surgery  Why is it important to control my blood sugar before and after surgery? Improving blood sugar levels before and after surgery helps healing and can limit problems. A way of improving blood sugar control is eating a healthy diet by:  Eating less sugar and carbohydrates  Increasing activity/exercise  Talking with your doctor about  reaching your blood sugar goals High blood sugars (greater than 180 mg/dL) can raise your risk of infections and slow your recovery, so you will need to focus on controlling your diabetes during the weeks before surgery. Make sure that the doctor who takes care of your diabetes knows about your planned surgery including the date and location.  How do I manage my blood sugar before surgery? Check your blood sugar at least 4 times a day, starting 2 days before surgery, to make sure that the level is not too high or low. Check your blood sugar the morning of your surgery when you wake up and every 2 hours until you get to the Short Stay unit. If your blood sugar is less than 70 mg/dL, you will need to treat for low blood sugar: Do not take insulin. Treat a low blood sugar (less than 70 mg/dL) with  cup of clear juice (cranberry or apple), 4 glucose tablets, OR glucose gel. Recheck blood sugar in 15 minutes after treatment (to make sure it is greater than  70 mg/dL). If your blood sugar is not greater than 70 mg/dL on recheck, call [phone] for further instructions. Report your blood sugar to the short stay nurse when you get to Short Stay.  If you are admitted to the hospital after surgery: Your blood sugar will be checked by the staff and you will probably be given insulin after surgery (instead of oral diabetes medicines) to make sure you have good blood sugar levels. The goal for blood sugar control after surgery is 80-180 mg/dL.  WHAT DO I DO ABOUT MY DIABETES MEDICATION?  The day before surgery take only the morning and lunch dose of Glipizide  Do not take oral diabetes medicines (pills) the morning of surgery.  THE NIGHT BEFORE SURGERY, take  0   units of Lantus  insulin.at bedtime        If your CBG is greater than 220 mg/dL, you may take  of your sliding scale  (correction) dose of insulin.     Bring CPAP mask and tubing day of surgery.                              You may  not have any metal on your body including  jewelry, and body piercing             Do not wear  lotions, powders, perfumes/cologne, or deodorant               Men may shave face and neck.   Do not bring valuables to the hospital. [place].   Contacts, dentures or bridgework may not be worn into surgery.   DO NOT [place].    Patients discharged on the day of surgery will not be allowed to drive home.  Someone NEEDS to stay with you for the first 24 hours after anesthesia.   Special Instructions: Bring a copy of your healthcare power of attorney and living will documents the day of surgery if you haven't scanned them before.              Please read over the following fact sheets you were given: IF YOU HAVE QUESTIONS ABOUT YOUR PRE-OP INSTRUCTIONS PLEASE CALL [phone]     [facility] - Preparing for Surgery Before surgery, you can play an important role.  Because skin is not sterile, your skin needs to be as free of germs as possible.  You can reduce the number of germs on your skin by washing with CHG (chlorahexidine gluconate) soap before surgery.  CHG is an antiseptic cleaner which kills germs and bonds with the skin to continue killing germs even after washing. Please DO NOT use if you have an allergy to CHG or antibacterial soaps.  If your skin becomes reddened/irritated stop using the CHG and inform your nurse when you arrive at Short Stay..  You may shave your face/neck. Please follow these instructions carefully:  1.  Shower with CHG Soap the night before surgery and the  morning of Surgery.  2.  If you choose to wash your hair, wash your hair first as usual with your  normal  shampoo.  3.  After you shampoo, rinse your hair and body thoroughly to remove the  shampoo.  4.  Use CHG as you would any other liquid soap.  You can apply chg directly  to the skin and wash                        Gently with a scrungie or clean washcloth.  5.  Apply the CHG Soap to your body ONLY FROM THE NECK DOWN.   Do not use on face/ open                           Wound or open sores. Avoid contact with eyes, ears mouth and genitals (private parts).                       Wash face,  Genitals (private parts) with your normal soap.             6.  Wash thoroughly, paying special attention to the area where your surgery  will be performed.  7.  Thoroughly rinse your body with warm water from the neck down.  8.  DO NOT shower/wash with your normal soap after using and rinsing off  the CHG Soap.                9.  Pat yourself dry with a clean towel.            10.  Wear clean pajamas.            11.  Place clean sheets on your bed the night of your first shower and do not  sleep with pets. Day of Surgery : Do not apply any lotions/deodorants the morning of surgery.  Please wear clean clothes to the hospital/surgery center.  FAILURE TO FOLLOW THESE INSTRUCTIONS MAY RESULT IN THE CANCELLATION OF YOUR SURGERY   ________________________________________________________________________ DEBIDO AL COVID-19 SLO SE PERMITEN DOS VISITANTES (de 16 aos en adelante)  PARA QUE VENGAN CON USTED Y SE QUEDEN EN LA SALA DE ESPERA SOLAMENTE DURANTE EL PRE OP Y EL PROCEDIMIENTO.   **NO SE PERMITEN VISITAS EN EL REA DE CORTA ESTADA NI EN LA SALA DE RECUPERACIN!!**  SI VA A SER INGRESADO(A) [place] SLO SE LE PERMITEN CUATRO PERSONAS DE APOYO DURANTE LAS HORAS DE VISITA (7 AM -8PM)   La(s) persona(s) de apoyo debe(n) pasar nuestra evaluacin, entrar y salir con gel y Teaching laboratory technician en todo momento, incluso en la habitacin del [place]. Los pacientes tambin deben usar una mscara cuando el personal o su persona de apoyo estn en la habitacin. Los visitantes DEBEN LLEVAR ETIQUETA DE VISITANTE DE UNA MANERA VISIBLE. Un visitante adulto International aid/development worker con usted durante la noche y DEBE estar en  la habitacin a las 8 P.M.     Su procedimiento est programado en: 12/09/21   Presntese a la entrada [place]     Presntese a admisiones por la maana 5:30 AM   Llame a este nmero si tiene [employer] maana de la ciruga al [phone]   No consuma alimentos: Despus de la medianoche.  No Liquidos                              Si tiene preguntas, por favor. Pngase en contacto con la oficina de su cirujano.   SIGA LA PREPARACIN INTESTINAL Y CUALQUIER INSTRUCCIN [place]!!!  La higiene bucal tambin es importante para reducir el riesgo de infeccin.                                   Recuerde - LVESE LOS DIENTES EN LA MAANA DE LA CIRUGA CON SU PASTA DENTAL HABITUAL   NO fume despus de la medianoche   [employer] medicamentos en la maana de la ciruga con UN SORBO DE AGUA:   NO TOME [place] ORAL PARA LA DIABETES EL DA DE LA CIRUGA  Traiga la mascarilla CPAP y los tubos el da de la [place].                              No debe trae ningn metal en el cuerpo, incluyendo pinzas para el cabello, joyas, ni aretes/pendientes             No use maquillaje, lociones/cremas, polvos, perfumes/colonias o desodorante  No use esmalte de uas, incluyendo los de gel ni S&S, uas artificiales/acrlicas o cualquier otro tipo de cobertura en las uas naturales, [employer]. Si tiene uas artificiales, con capas de gel, etc. que necesite que le quiten en un saln de uas, por favor, pida que se lo quiten antes de la ciruga o la ciruga podra ser cancelada/retrasada si el cirujano o el anestesilogo consideran que no puede ser monitoreado(a) de una forma segura.   No se rasure en las 48 horas antes de la operacin.               Los hombres pueden [employer] cara y el cuello.   No traiga objetos de valor al hospital. [place] NO SE HACE  RESPONSABLE DE LOS OBJETOS DE VALOR.   Los contactos, las dentaduras o los puentes no se pueden usar durante la [place].   Sheela Stack una bolsa pequea para la noche el da de la [place].   NO TRAIGA AL HOSPITAL LOS MEDICAMENTOS QUE TOMA EN CASA . [place] EN SU LISTA [place]!    Los pacientes dados de alta el mismo da de la ciruga no podrn Air cabin crew a casa.  Es NECESARIO que alguien se quede con usted durante las primeras 24 horas despus de la anestesia.   Instrucciones especiales: [employer] copia de sus documentos de poder notarial y testamento vital el da de su ciruga si no los ha escaneado antes.              Por favor, lea las siguientes hojas informativas que le dieron: SI TIENE PREGUNTAS SOBRE SUS [place] [phone]

## 2021-12-05 ENCOUNTER — Encounter (HOSPITAL_COMMUNITY)
Admission: RE | Admit: 2021-12-05 | Discharge: 2021-12-05 | Disposition: A | Discharge status: Home / Self Care | Payer: Medicare Other | Source: Ambulatory Visit | Attending: Urology | Admitting: Urology

## 2021-12-05 ENCOUNTER — Encounter (HOSPITAL_COMMUNITY): Payer: Self-pay

## 2021-12-05 ENCOUNTER — Other Ambulatory Visit: Payer: Self-pay

## 2021-12-05 DIAGNOSIS — E119 Type 2 diabetes mellitus without complications: Secondary | ICD-10-CM | POA: Insufficient documentation

## 2021-12-05 DIAGNOSIS — Z01812 Encounter for preprocedural laboratory examination: Secondary | ICD-10-CM | POA: Insufficient documentation

## 2021-12-05 DIAGNOSIS — Z794 Long term (current) use of insulin: Secondary | ICD-10-CM | POA: Insufficient documentation

## 2021-12-05 HISTORY — DX: Unspecified asthma, uncomplicated: J45.909

## 2021-12-05 LAB — CBC
HCT: 46.5 % (ref 39.0–52.0)
Hemoglobin: 14.9 g/dL (ref 13.0–17.0)
MCH: 27.6 pg (ref 26.0–34.0)
MCHC: 32 g/dL (ref 30.0–36.0)
MCV: 86.3 fL (ref 80.0–100.0)
Platelets: 244 10*3/uL (ref 150–400)
RBC: 5.39 MIL/uL (ref 4.22–5.81)
RDW: 13.2 % (ref 11.5–15.5)
WBC: 10.1 10*3/uL (ref 4.0–10.5)
nRBC: 0 % (ref 0.0–0.2)

## 2021-12-05 LAB — HEMOGLOBIN A1C
Hgb A1c MFr Bld: 14.2 % — ABNORMAL HIGH (ref 4.8–5.6)
Mean Plasma Glucose: 360.84 mg/dL

## 2021-12-05 LAB — BASIC METABOLIC PANEL
Anion gap: 9 (ref 5–15)
BUN: 26 mg/dL — ABNORMAL HIGH (ref 8–23)
CO2: 25 mmol/L (ref 22–32)
Calcium: 9 mg/dL (ref 8.9–10.3)
Chloride: 100 mmol/L (ref 98–111)
Creatinine, Ser: 1.7 mg/dL — ABNORMAL HIGH (ref 0.61–1.24)
GFR, Estimated: 39 mL/min — ABNORMAL LOW (ref >60)
Glucose, Bld: 311 mg/dL — ABNORMAL HIGH (ref 70–99)
Potassium: 4.4 mmol/L (ref 3.5–5.1)
Sodium: 134 mmol/L — ABNORMAL LOW (ref 135–145)

## 2021-12-05 LAB — GLUCOSE, CAPILLARY: Glucose-Capillary: 321 mg/dL — ABNORMAL HIGH (ref 70–99)

## 2021-12-05 NOTE — Progress Notes (Signed)
Anesthesia note:  Bowel prep reminder:NA  PCP - Dr. Bobbye Riggs Cardiologist -none Other-   Chest x-ray - no EKG - 04/25/21-epic Stress Test - no ECHO - no Cardiac Cath - NA  Pacemaker/ICD device last checked:NA  Sleep Study - no CPAP -   Fasting Blood Sugar - 200-300 Checks Blood Sugar every day____  Blood Thinner:Plavix/ Dr. Carlena Bjornstad Blood Thinner Instructions:hold 3 days Aspirin Instructions: Last Dose:Pt stopped it 12/03/21  Anesthesia review: yes  Patient denies shortness of breath, fever, cough and chest pain at PAT appointment Pt came to PAT without an interpreter. He jokes and talks a lot but it is unclear if he has understanding. Med rec was done with difficulty. He lives alone and has no body who can stay with him or anywhere to go after surgery. Dr. Keane Scrape office called to explain thwt he needs to stay and needs an interpreter on DOS.   Patient verbalized understanding of instructions that were given to them at the PAT appointment. Patient was also instructed that they will need to review over the PAT instructions again at home before surgery. Instructions were reviewed and Maudry Mayhew RN was able to talk to him in [place] to review his instructions and what to do with his diabetic meds

## 2021-12-08 NOTE — Anesthesia Preprocedure Evaluation (Addendum)
Anesthesia Evaluation  Patient identified by MRN, date of birth, ID band Patient awake    Reviewed: Allergy & Precautions, NPO status , Patient's Chart, lab work & pertinent test results  History of Anesthesia Complications Negative for: history of anesthetic complications  Airway Mallampati: II  TM Distance: >3 FB Neck ROM: Full    Dental no notable dental hx. (+) Poor Dentition, Dental Advisory Given, Chipped, Missing   Pulmonary neg pulmonary ROS, COPD,  COPD inhaler,    Pulmonary exam normal breath sounds clear to auscultation       Cardiovascular hypertension, Pt. on home beta blockers and Pt. on medications negative cardio ROS Normal cardiovascular exam Rhythm:Regular Rate:Normal     Neuro/Psych PSYCHIATRIC DISORDERS Anxiety Depression negative neurological ROS  negative psych ROS   GI/Hepatic negative GI ROS, Neg liver ROS,   Endo/Other  negative endocrine ROSdiabetes, Poorly Controlled  Renal/GU CRFRenal diseasenegative Renal ROS  negative genitourinary   Musculoskeletal negative musculoskeletal ROS (+) Arthritis ,   Abdominal   Peds negative pediatric ROS (+)  Hematology negative hematology ROS (+) Blood dyscrasia, ,   Anesthesia Other Findings   Reproductive/Obstetrics negative OB ROS                            Anesthesia Physical  Anesthesia Plan  ASA: 3  Anesthesia Plan: General   Post-op Pain Management: Tylenol PO (pre-op)*   Induction: Intravenous  PONV Risk Score and Plan: 2 and Ondansetron and Diphenhydramine  Airway Management Planned: LMA and Oral ETT  Additional Equipment: None  Intra-op Plan:   Post-operative Plan: Extubation in OR  Informed Consent: I have reviewed the patients History and Physical, chart, labs and discussed the procedure including the risks, benefits and alternatives for the proposed anesthesia with the patient or authorized  representative who has indicated his/her understanding and acceptance.     Dental advisory given  Plan Discussed with: CRNA and Anesthesiologist  Anesthesia Plan Comments: (See PAT note 09/25/21 DISCUSSION:86 y.o. never smoker with h/o HTN, DM II, CKD Stage III, COPD, urethral stricture, bladder t umor scheduled for above procedure 09/30/2021 with Dr. Jacalyn Lefevre.   Pt seen by vascular surgeon for evaluation of saccular abdominal aortic aneurysm.  Maximal diameter of this aneurysmal segment is 3.3 cm. Per OV note will follow, no intervention at this time.  6 month visit with CT scheduled. )       Anesthesia Quick Evaluation

## 2021-12-09 ENCOUNTER — Encounter (HOSPITAL_COMMUNITY)
Admission: RE | Disposition: A | Discharge status: Home / Self Care | Payer: Self-pay | Source: Ambulatory Visit | Attending: Urology

## 2021-12-09 ENCOUNTER — Ambulatory Visit (HOSPITAL_BASED_OUTPATIENT_CLINIC_OR_DEPARTMENT_OTHER): Payer: Medicare Other | Admitting: Certified Registered Nurse Anesthetist

## 2021-12-09 ENCOUNTER — Observation Stay (HOSPITAL_COMMUNITY)
Admission: RE | Admit: 2021-12-09 | Discharge: 2021-12-10 | Disposition: A | Discharge status: Home / Self Care | Payer: Medicare Other | Source: Ambulatory Visit | Attending: Urology | Admitting: Urology

## 2021-12-09 ENCOUNTER — Ambulatory Visit (HOSPITAL_COMMUNITY): Payer: Medicare Other | Admitting: Physician Assistant

## 2021-12-09 ENCOUNTER — Encounter (HOSPITAL_COMMUNITY): Payer: Self-pay | Admitting: Urology

## 2021-12-09 DIAGNOSIS — N35912 Unspecified bulbous urethral stricture, male: Secondary | ICD-10-CM | POA: Diagnosis not present

## 2021-12-09 DIAGNOSIS — N35011 Post-traumatic bulbous urethral stricture: Secondary | ICD-10-CM | POA: Diagnosis not present

## 2021-12-09 DIAGNOSIS — C672 Malignant neoplasm of lateral wall of bladder: Secondary | ICD-10-CM | POA: Diagnosis not present

## 2021-12-09 DIAGNOSIS — J45909 Unspecified asthma, uncomplicated: Secondary | ICD-10-CM | POA: Diagnosis not present

## 2021-12-09 DIAGNOSIS — C679 Malignant neoplasm of bladder, unspecified: Secondary | ICD-10-CM

## 2021-12-09 DIAGNOSIS — Z7902 Long term (current) use of antithrombotics/antiplatelets: Secondary | ICD-10-CM | POA: Insufficient documentation

## 2021-12-09 DIAGNOSIS — N35919 Unspecified urethral stricture, male, unspecified site: Secondary | ICD-10-CM | POA: Diagnosis present

## 2021-12-09 DIAGNOSIS — Z794 Long term (current) use of insulin: Secondary | ICD-10-CM | POA: Diagnosis not present

## 2021-12-09 DIAGNOSIS — J449 Chronic obstructive pulmonary disease, unspecified: Secondary | ICD-10-CM | POA: Diagnosis not present

## 2021-12-09 DIAGNOSIS — F418 Other specified anxiety disorders: Secondary | ICD-10-CM

## 2021-12-09 DIAGNOSIS — E119 Type 2 diabetes mellitus without complications: Secondary | ICD-10-CM

## 2021-12-09 DIAGNOSIS — I10 Essential (primary) hypertension: Secondary | ICD-10-CM | POA: Insufficient documentation

## 2021-12-09 HISTORY — PX: CYSTOSCOPY WITH URETHRAL DILATATION: SHX5125

## 2021-12-09 LAB — GLUCOSE, CAPILLARY
Glucose-Capillary: 259 mg/dL — ABNORMAL HIGH (ref 70–99)
Glucose-Capillary: 255 mg/dL — ABNORMAL HIGH (ref 70–99)
Glucose-Capillary: 245 mg/dL — ABNORMAL HIGH (ref 70–99)
Glucose-Capillary: 333 mg/dL — ABNORMAL HIGH (ref 70–99)
Glucose-Capillary: 303 mg/dL — ABNORMAL HIGH (ref 70–99)
Glucose-Capillary: 221 mg/dL — ABNORMAL HIGH (ref 70–99)

## 2021-12-09 SURGERY — CYSTOSCOPY, WITH URETHRAL DILATION
Anesthesia: General

## 2021-12-09 MED ORDER — FENTANYL CITRATE (PF) 100 MCG/2ML IJ SOLN
INTRAMUSCULAR | Status: DC | PRN
  Administered 2021-12-09 (×2): 50 ug via INTRAVENOUS

## 2021-12-09 MED ORDER — ACETAMINOPHEN 500 MG PO TABS
ORAL_TABLET | ORAL | Status: AC
  Filled 2021-12-09: qty 2

## 2021-12-09 MED ORDER — METOPROLOL SUCCINATE ER 25 MG PO TB24
25.0000 mg | ORAL_TABLET | Freq: Every day | ORAL | Status: DC
  Administered 2021-12-10: 25 mg via ORAL
  Filled 2021-12-09: qty 1

## 2021-12-09 MED ORDER — PHENYLEPHRINE 80 MCG/ML (10ML) SYRINGE FOR IV PUSH (FOR BLOOD PRESSURE SUPPORT)
PREFILLED_SYRINGE | INTRAVENOUS | Status: DC | PRN
  Administered 2021-12-09: 80 ug via INTRAVENOUS
  Administered 2021-12-09 (×2): 120 ug via INTRAVENOUS

## 2021-12-09 MED ORDER — PROPOFOL 10 MG/ML IV BOLUS
INTRAVENOUS | Status: DC | PRN
  Administered 2021-12-09: 130 mg via INTRAVENOUS

## 2021-12-09 MED ORDER — ONDANSETRON HCL 4 MG/2ML IJ SOLN: 4.0000 mg | INTRAMUSCULAR | Status: DC | PRN

## 2021-12-09 MED ORDER — OXYCODONE HCL 5 MG PO TABS
ORAL_TABLET | ORAL | Status: AC
  Administered 2021-12-09: 5 mg via ORAL
  Filled 2021-12-09: qty 1

## 2021-12-09 MED ORDER — GABAPENTIN 100 MG PO CAPS
100.0000 mg | ORAL_CAPSULE | Freq: Two times a day (BID) | ORAL | Status: DC
  Administered 2021-12-09 – 2021-12-10 (×2): 100 mg via ORAL
  Filled 2021-12-09 (×2): qty 1

## 2021-12-09 MED ORDER — SEMAGLUTIDE 14 MG PO TABS: 14.0000 mg | ORAL_TABLET | Freq: Every day | ORAL | Status: DC

## 2021-12-09 MED ORDER — OXYCODONE HCL 5 MG PO TABS: 5.0000 mg | ORAL_TABLET | Freq: Once | ORAL | Status: AC | PRN

## 2021-12-09 MED ORDER — CEFAZOLIN SODIUM-DEXTROSE 2-4 GM/100ML-% IV SOLN
2.0000 g | INTRAVENOUS | Status: AC
  Administered 2021-12-09: 2 g via INTRAVENOUS
  Filled 2021-12-09: qty 100

## 2021-12-09 MED ORDER — ONDANSETRON HCL 4 MG/2ML IJ SOLN
INTRAMUSCULAR | Status: AC
  Filled 2021-12-09: qty 2

## 2021-12-09 MED ORDER — SODIUM CHLORIDE 0.9% FLUSH
3.0000 mL | Freq: Two times a day (BID) | INTRAVENOUS | Status: DC
  Administered 2021-12-09 (×2): 3 mL via INTRAVENOUS

## 2021-12-09 MED ORDER — INSULIN ASPART 100 UNIT/ML IJ SOLN
0.0000 [IU] | Freq: Every day | INTRAMUSCULAR | Status: DC
  Administered 2021-12-09: 2 [IU] via SUBCUTANEOUS

## 2021-12-09 MED ORDER — LACTATED RINGERS IV SOLN: INTRAVENOUS | Status: DC

## 2021-12-09 MED ORDER — INSULIN GLARGINE-YFGN 100 UNIT/ML ~~LOC~~ SOLN
15.0000 [IU] | Freq: Every day | SUBCUTANEOUS | Status: DC
  Administered 2021-12-09: 15 [IU] via SUBCUTANEOUS
  Filled 2021-12-09 (×2): qty 0.15

## 2021-12-09 MED ORDER — LIDOCAINE 2% (20 MG/ML) 5 ML SYRINGE
INTRAMUSCULAR | Status: DC | PRN
  Administered 2021-12-09: 80 mg via INTRAVENOUS

## 2021-12-09 MED ORDER — ACETAMINOPHEN 160 MG/5ML PO SOLN: 325.0000 mg | ORAL | Status: DC | PRN

## 2021-12-09 MED ORDER — MORPHINE SULFATE (PF) 2 MG/ML IV SOLN: 2.0000 mg | INTRAVENOUS | Status: DC | PRN

## 2021-12-09 MED ORDER — PANTOPRAZOLE SODIUM 40 MG PO TBEC: 40.0000 mg | DELAYED_RELEASE_TABLET | Freq: Every day | ORAL | Status: DC | PRN

## 2021-12-09 MED ORDER — 0.9 % SODIUM CHLORIDE (POUR BTL) OPTIME
TOPICAL | Status: DC | PRN
  Administered 2021-12-09: 1000 mL

## 2021-12-09 MED ORDER — FENTANYL CITRATE (PF) 100 MCG/2ML IJ SOLN
INTRAMUSCULAR | Status: AC
  Filled 2021-12-09: qty 2

## 2021-12-09 MED ORDER — CHLORHEXIDINE GLUCONATE CLOTH 2 % EX PADS
6.0000 | MEDICATED_PAD | Freq: Every day | CUTANEOUS | Status: DC
  Administered 2021-12-09: 6 via TOPICAL

## 2021-12-09 MED ORDER — DULOXETINE HCL 60 MG PO CPEP
60.0000 mg | ORAL_CAPSULE | Freq: Every day | ORAL | Status: DC
  Administered 2021-12-10: 60 mg via ORAL
  Filled 2021-12-09: qty 1

## 2021-12-09 MED ORDER — TRAZODONE HCL 100 MG PO TABS
100.0000 mg | ORAL_TABLET | Freq: Every day | ORAL | Status: DC
  Administered 2021-12-09: 100 mg via ORAL
  Filled 2021-12-09: qty 1

## 2021-12-09 MED ORDER — FENTANYL CITRATE PF 50 MCG/ML IJ SOSY
25.0000 ug | PREFILLED_SYRINGE | INTRAMUSCULAR | Status: DC | PRN
  Administered 2021-12-09: 50 ug via INTRAVENOUS

## 2021-12-09 MED ORDER — DIPHENHYDRAMINE HCL 50 MG/ML IJ SOLN
INTRAMUSCULAR | Status: DC | PRN
  Administered 2021-12-09: 12.5 mg via INTRAVENOUS

## 2021-12-09 MED ORDER — SODIUM CHLORIDE 0.9% FLUSH
3.0000 mL | INTRAVENOUS | Status: DC | PRN
Start: 2021-12-09 — End: 2021-12-10

## 2021-12-09 MED ORDER — ACETAMINOPHEN 325 MG PO TABS: 650.0000 mg | ORAL_TABLET | ORAL | Status: DC | PRN

## 2021-12-09 MED ORDER — EPHEDRINE 5 MG/ML INJ
INTRAVENOUS | Status: AC
  Filled 2021-12-09: qty 5

## 2021-12-09 MED ORDER — ROSUVASTATIN CALCIUM 20 MG PO TABS
40.0000 mg | ORAL_TABLET | Freq: Every day | ORAL | Status: DC
  Administered 2021-12-10: 40 mg via ORAL
  Filled 2021-12-09: qty 2

## 2021-12-09 MED ORDER — DOCUSATE SODIUM 100 MG PO CAPS
100.0000 mg | ORAL_CAPSULE | Freq: Two times a day (BID) | ORAL | Status: DC
  Administered 2021-12-09 – 2021-12-10 (×3): 100 mg via ORAL
  Filled 2021-12-09 (×3): qty 1

## 2021-12-09 MED ORDER — FUROSEMIDE 40 MG PO TABS
40.0000 mg | ORAL_TABLET | Freq: Every day | ORAL | Status: DC
  Administered 2021-12-09 – 2021-12-10 (×2): 40 mg via ORAL
  Filled 2021-12-09 (×2): qty 1

## 2021-12-09 MED ORDER — PHENYLEPHRINE 80 MCG/ML (10ML) SYRINGE FOR IV PUSH (FOR BLOOD PRESSURE SUPPORT)
PREFILLED_SYRINGE | INTRAVENOUS | Status: AC
  Filled 2021-12-09: qty 10

## 2021-12-09 MED ORDER — ONDANSETRON HCL 4 MG/2ML IJ SOLN
INTRAMUSCULAR | Status: DC | PRN
  Administered 2021-12-09: 4 mg via INTRAVENOUS

## 2021-12-09 MED ORDER — CEPHALEXIN 500 MG PO CAPS
500.0000 mg | ORAL_CAPSULE | Freq: Three times a day (TID) | ORAL | Status: DC
  Administered 2021-12-09 – 2021-12-10 (×3): 500 mg via ORAL
  Filled 2021-12-09 (×3): qty 1

## 2021-12-09 MED ORDER — DEXAMETHASONE SODIUM PHOSPHATE 10 MG/ML IJ SOLN
INTRAMUSCULAR | Status: AC
  Filled 2021-12-09: qty 1

## 2021-12-09 MED ORDER — TETRAHYDROZOLINE HCL 0.05 % OP SOLN: 2.0000 [drp] | OPHTHALMIC | Status: DC | PRN

## 2021-12-09 MED ORDER — PROPOFOL 1000 MG/100ML IV EMUL
INTRAVENOUS | Status: AC
  Filled 2021-12-09: qty 100

## 2021-12-09 MED ORDER — FENTANYL CITRATE PF 50 MCG/ML IJ SOSY
PREFILLED_SYRINGE | INTRAMUSCULAR | Status: AC
  Filled 2021-12-09: qty 3

## 2021-12-09 MED ORDER — MEPERIDINE HCL 50 MG/ML IJ SOLN: 6.2500 mg | INTRAMUSCULAR | Status: DC | PRN

## 2021-12-09 MED ORDER — SODIUM CHLORIDE 0.9 % IR SOLN
Status: DC | PRN
  Administered 2021-12-09: 3000 mL

## 2021-12-09 MED ORDER — OXYCODONE HCL 5 MG/5ML PO SOLN: 5.0000 mg | Freq: Once | ORAL | Status: AC | PRN

## 2021-12-09 MED ORDER — SENNA 8.6 MG PO TABS
1.0000 | ORAL_TABLET | Freq: Two times a day (BID) | ORAL | Status: DC
Start: 2021-12-09 — End: 2021-12-10
  Administered 2021-12-09 – 2021-12-10 (×3): 8.6 mg via ORAL
  Filled 2021-12-09 (×3): qty 1

## 2021-12-09 MED ORDER — ACETAMINOPHEN 325 MG PO TABS
ORAL_TABLET | ORAL | Status: DC | PRN
  Administered 2021-12-09: 1000 mg via ORAL

## 2021-12-09 MED ORDER — ONDANSETRON HCL 4 MG/2ML IJ SOLN: 4.0000 mg | Freq: Once | INTRAMUSCULAR | Status: DC | PRN

## 2021-12-09 MED ORDER — ACETAMINOPHEN 325 MG PO TABS: 325.0000 mg | ORAL_TABLET | ORAL | Status: DC | PRN

## 2021-12-09 MED ORDER — EPHEDRINE SULFATE-NACL 50-0.9 MG/10ML-% IV SOSY
PREFILLED_SYRINGE | INTRAVENOUS | Status: DC | PRN
  Administered 2021-12-09 (×5): 5 mg via INTRAVENOUS

## 2021-12-09 MED ORDER — MONTELUKAST SODIUM 10 MG PO TABS
10.0000 mg | ORAL_TABLET | Freq: Every day | ORAL | Status: DC
  Administered 2021-12-09 – 2021-12-10 (×2): 10 mg via ORAL
  Filled 2021-12-09 (×2): qty 1

## 2021-12-09 MED ORDER — ORAL CARE MOUTH RINSE: 15.0000 mL | Freq: Once | OROMUCOSAL | Status: AC

## 2021-12-09 MED ORDER — LIDOCAINE 2% (20 MG/ML) 5 ML SYRINGE
INTRAMUSCULAR | Status: AC
Start: 2021-12-09 — End: ?
  Filled 2021-12-09: qty 5

## 2021-12-09 MED ORDER — TRIPLE ANTIBIOTIC 3.5-400-5000 EX OINT: 1.0000 | TOPICAL_OINTMENT | Freq: Three times a day (TID) | CUTANEOUS | Status: DC | PRN

## 2021-12-09 MED ORDER — CHLORHEXIDINE GLUCONATE 0.12 % MT SOLN
15.0000 mL | Freq: Once | OROMUCOSAL | Status: AC
  Administered 2021-12-09: 15 mL via OROMUCOSAL

## 2021-12-09 MED ORDER — DIPHENHYDRAMINE HCL 50 MG/ML IJ SOLN
INTRAMUSCULAR | Status: AC
  Filled 2021-12-09: qty 1

## 2021-12-09 MED ORDER — SODIUM CHLORIDE 0.9 % IV SOLN
250.0000 mL | INTRAVENOUS | Status: DC | PRN
Start: 2021-12-09 — End: 2021-12-10

## 2021-12-09 MED ORDER — GLIPIZIDE 10 MG PO TABS
10.0000 mg | ORAL_TABLET | Freq: Two times a day (BID) | ORAL | Status: DC
  Administered 2021-12-09 – 2021-12-10 (×2): 10 mg via ORAL
  Filled 2021-12-09 (×2): qty 1

## 2021-12-09 MED ORDER — INSULIN ASPART 100 UNIT/ML IJ SOLN
0.0000 [IU] | Freq: Three times a day (TID) | INTRAMUSCULAR | Status: DC
  Administered 2021-12-09: 11 [IU] via SUBCUTANEOUS
  Administered 2021-12-10: 2 [IU] via SUBCUTANEOUS

## 2021-12-09 SURGICAL SUPPLY — 31 items
BAG URINE DRAIN 2000ML AR STRL (UROLOGICAL SUPPLIES) ×2 IMPLANT
BAG URO CATCHER STRL LF (MISCELLANEOUS) ×2 IMPLANT
BALLN NEPHROSTOMY (BALLOONS)
BALLN OPTILUME DCB 30X5X75 (BALLOONS) ×2
BALLOON NEPHROSTOMY (BALLOONS) IMPLANT
BALLOON OPTILUME DCB 30X5X75 (BALLOONS) IMPLANT
CATH FOLEY 2W COUNCIL 20FR 5CC (CATHETERS) IMPLANT
CATH FOLEY 2W COUNCIL 5CC 18FR (CATHETERS) ×1 IMPLANT
CATH ROBINSON RED A/P 14FR (CATHETERS) ×1 IMPLANT
CATH URET 5FR 28IN CONE TIP (BALLOONS)
CATH URET 5FR 70CM CONE TIP (BALLOONS) IMPLANT
CATH URETL OPEN END 6FR 70 (CATHETERS) IMPLANT
CLOTH BEACON ORANGE TIMEOUT ST (SAFETY) ×2 IMPLANT
DRAPE FOOT SWITCH (DRAPES) ×2 IMPLANT
ELECT REM PT RETURN 15FT ADLT (MISCELLANEOUS) ×1 IMPLANT
GLOVE BIO SURGEON STRL SZ 6.5 (GLOVE) ×2 IMPLANT
GLOVE BIO SURGEON STRL SZ7.5 (GLOVE) ×2 IMPLANT
GOWN STRL REUS W/ TWL LRG LVL3 (GOWN DISPOSABLE) ×1 IMPLANT
GOWN STRL REUS W/TWL LRG LVL3 (GOWN DISPOSABLE) ×1
GUIDEWIRE ANG ZIPWIRE 038X150 (WIRE) IMPLANT
GUIDEWIRE STR DUAL SENSOR (WIRE) ×2 IMPLANT
KIT TURNOVER KIT A (KITS) IMPLANT
LOOP CUT BIPOLAR 24F LRG (ELECTROSURGICAL) IMPLANT
MANIFOLD NEPTUNE II (INSTRUMENTS) ×2 IMPLANT
NS IRRIG 1000ML POUR BTL (IV SOLUTION) IMPLANT
PACK CYSTO (CUSTOM PROCEDURE TRAY) ×2 IMPLANT
SYR TOOMEY IRRIG 70ML (MISCELLANEOUS)
SYRINGE TOOMEY IRRIG 70ML (MISCELLANEOUS) IMPLANT
TUBING CONNECTING 10 (TUBING) ×2 IMPLANT
TUBING UROLOGY SET (TUBING) ×2 IMPLANT
WATER STERILE IRR 3000ML UROMA (IV SOLUTION) ×1 IMPLANT

## 2021-12-09 NOTE — H&P (Signed)
CC/HPI: cc: Bladder cancer, urethral stricture     Per care everywhere in Epic, pt last saw Dr. Amalia Hailey at [facility] in July 2020  Pt s/p TURBT 02-15-18 with foley placed at the end of the case. He passed voiding trial. Path was Ta LG.  09/2021 TURBT high-grade Ta recurrence    10/08/2021: 87 year old man with a history of low-grade TA bladder cancer diagnosed in 2019 who presented to [facility] urology with gross hematuria and imaging concerning for CT scan. Patient had bulbar urethral stricture and bladder was not accessible with flexible cystoscope in the office. He underwent cystoscopy with urethral balloon dilation and TURBT of large bladder tumor. He is here today for void trial. Pathology shows HG Ta bladder cancer.   10/27/2021: Here today for evaluation of pain with voiding. Symptoms present for 1 week. Also associated with painful ability to start his stream and increased frequency/urgency. He denies gross hematuria or correlating lower back or flank pain/discomfort suggestive of obstructive uropathy. Of note patient has a history of underlying diabetes. This is poorly controlled, last A1c was greater than 13.   11/21/2021: 87 year old man with history of high-grade Ta bladder cancer with last recurrence June 2023 now with 2 episodes of gross hematuria. Patient also noted to have bulbar urethral stricture that required dilation in the operating room at time of resection.     ALLERGIES: No Known Allergies    MEDICATIONS: Plavix 75 mg tablet  Tamsulosin Hcl 0.4 mg capsule 1 capsule PO Daily  Insulin Aspart  Tylenol     GU PSH: Cysto Dilate Stricture (M or F) - 09/30/2021 Cystoscopy - 08/04/2021 Cystoscopy TURBT >5 cm - 09/30/2021     NON-GU PSH: None   GU PMH: Dysuria - 10/27/2021 Urinary Urgency - 10/27/2021 Bladder Cancer Lateral - 10/08/2021 Bulbar urethral stricture - 10/08/2021, - 09/24/2021, - 08/04/2021 Bladder tumor/neoplasm - 09/24/2021, - 08/04/2021      PMH Notes: Diabetes   NON-GU  PMH: Arthritis Asthma Hypertension    FAMILY HISTORY: None   SOCIAL HISTORY: Marital Status: Married Preferred Language: Spanish; Castilian; Ethnicity:  Current Smoking Status: Patient does not smoke anymore.   Tobacco Use Assessment Completed: Used Tobacco in last 30 days? Drinks 2 caffeinated drinks per day.    REVIEW OF SYSTEMS:    GU Review Male:   Patient reports frequent urination, burning/ pain with urination, and get up at night to urinate. Patient denies hard to postpone urination, leakage of urine, stream starts and stops, trouble starting your stream, have to strain to urinate , erection problems, and penile pain.  Gastrointestinal (Upper):   Patient denies nausea, vomiting, and indigestion/ heartburn.  Gastrointestinal (Lower):   Patient denies diarrhea and constipation.  Constitutional:   Patient denies fever, night sweats, weight loss, and fatigue.  Skin:   Patient denies itching and skin rash/ lesion.  Eyes:   Patient denies blurred vision and double vision.  Ears/ Nose/ Throat:   Patient denies sore throat and sinus problems.  Hematologic/Lymphatic:   Patient denies swollen glands and easy bruising.  Cardiovascular:   Patient denies leg swelling and chest pains.  Respiratory:   Patient denies cough and shortness of breath.  Endocrine:   Patient denies excessive thirst.  Musculoskeletal:   Patient denies back pain and joint pain.  Neurological:   Patient denies headaches and dizziness.  Psychologic:   Patient denies depression and anxiety.   Notes: Pt states that he is been having blood in his urine.    VITAL SIGNS:  11/21/2021 01:57 PM  BP 98/71 mmHg  Pulse 86 /min   MULTI-SYSTEM PHYSICAL EXAMINATION:    Constitutional: Well-nourished. No physical deformities. Normally developed. Good grooming.  Neck: Neck symmetrical, not swollen. Normal tracheal position.  Respiratory: No labored breathing, no use of accessory muscles.   Skin: No paleness, no jaundice,  no cyanosis. No lesion, no ulcer, no rash.  Neurologic / Psychiatric: Oriented to time, oriented to place, oriented to person. No depression, no anxiety, no agitation.  Eyes: Normal conjunctivae. Normal eyelids.  Ears, Nose, Mouth, and Throat: Left ear no scars, no lesions, no masses. Right ear no scars, no lesions, no masses. Nose no scars, no lesions, no masses. Normal hearing. Normal lips.  Musculoskeletal: Normal gait and station of head and neck.     Complexity of Data:  Records Review:   Previous Patient Records, POC Tool  Urine Test Review:   Urinalysis   PROCEDURES:         Flexible Cystoscopy - 52000  Risks, benefits, and some of the potential complications of the procedure were discussed at length with the patient including infection, bleeding, voiding discomfort, urinary retention, fever, chills, sepsis, and others. All questions were answered. Informed consent was obtained. Sterile technique and intraurethral analgesia were used.  Meatus:  Normal size. Normal location. Normal condition.  Urethra:  Moderate penile stricture. Unable to traverse scope.      Flexible cystoscopy to scope was placed in the urethra meatus. It shortly thereafter encountered resistance and a short but narrow stricture was seen in the penile urethra. This was unable to be traversed by cystoscope. The procedure was well-tolerated and without complications. Antibiotic instructions were given. Instructions were given to call the office immediately for bloody urine, difficulty urinating, urinary retention, painful or frequent urination, fever, chills, nausea, vomiting or other illness. The patient stated that he understood these instructions and would comply with them.         Urinalysis w/Scope Dipstick Dipstick Cont'd Micro  Color: Yellow Bilirubin: Neg mg/dL WBC/hpf: 6 - 10/hpf  Appearance: Slightly Cloudy Ketones: Neg mg/dL RBC/hpf: 3 - 10/hpf  Specific Gravity: 1.015 Blood: 3+ ery/uL Bacteria: Rare  (0-9/hpf)  pH: <=5.0 Protein: Neg mg/dL Cystals: NS (Not Seen)  Glucose: 2+ mg/dL Urobilinogen: 0.2 mg/dL Casts: Hyaline    Nitrites: Neg Trichomonas: Not Present    Leukocyte Esterase: Neg leu/uL Mucous: Not Present      Epithelial Cells: 0 - 5/hpf      Yeast: NS (Not Seen)      Sperm: Not Present    Notes: cellular, granular present    ASSESSMENT:      ICD-10 Details  1 GU:   Bladder Cancer Lateral - C67.2 Chronic, Stable  2   Bulbar urethral stricture - N35.011 Chronic, Worsening  3   Gross hematuria - R31.0 Undiagnosed New Problem   PLAN:           Document Letter(s):  Created for Patient: Clinical Summary         Notes:   1. Bladder cancer:  -Patient with gross hematuria however unable to evaluate bladder today due to stricture  -He is due for cystoscopy in September however we will plan on cystoscopy in the operating room at time of urethral dilation and possible TURBT if recurrence is present   2. Urethral stricture:  -Unable to traverse scope due to penile urethral stricture  -Risks and benefits of cystoscopy with urethral dilation discussed the patient in detail today including but not limited  to pain, bleeding, recurrence, demonstrating structures, need for additional treatment, need for an jun Foley catheter  -We will plan on OptiLume in OR   This encounter took place with a video interpreter

## 2021-12-09 NOTE — Anesthesia Procedure Notes (Signed)
Procedure Name: LMA Insertion Date/Time: 12/09/2021 7:30 AM  Performed by: Claudia Desanctis, CRNAPre-anesthesia Checklist: Emergency Drugs available, Patient identified, Suction available and Patient being monitored Patient Re-evaluated:Patient Re-evaluated prior to induction Oxygen Delivery Method: Circle system utilized Preoxygenation: Pre-oxygenation with 100% oxygen Induction Type: IV induction Ventilation: Mask ventilation without difficulty LMA: LMA with gastric port inserted LMA Size: 4.0 Number of attempts: 1 Placement Confirmation: positive ETCO2 and breath sounds checked- equal and bilateral Tube secured with: Tape Dental Injury: Teeth and Oropharynx as per pre-operative assessment

## 2021-12-09 NOTE — Op Note (Signed)
Operative Note  Preoperative diagnosis:  1.  Urethral stricture 2.  Gross hematuria 3.  Bladder cancer  Postoperative diagnosis: 1.  Urethral stricture 2.  Gross hematuria 3.  Bladder cancer  Procedure(s): 1.  Cystoscopy with optilume urethral balloon dilation  Surgeon: Jacalyn Lefevre, MD  Assistants:  None  Anesthesia:  General  Complications:  None  EBL:  minimal  Specimens: 1. none  Drains/Catheters: 1.  18Fr council tip foley  Intraoperative findings:   Dense pan  anterior urethral stricture dilated to 30Fr with optilume balloon dilator Bilateral orthotopic ureteral orifices Mild trabeculation No bladder tumor recurrence noted  Indication:  Eddie Graham is a 87 y.o. male with with a history of high-grade TA bladder cancer with last known recurrence in June 2023 with 2 episodes of gross hematuria following resection.  He was noted to have a bulbar urethral stricture requiring dilation in the operating room at time of resection.  Attempted cystoscopy in the office was aborted due to recurrent urethral stricture.  Description of procedure:  After risks and benefits of the procedure were discussed with the patient in detail, informed consent was obtained.  The patient taken the operating placed in supine position.  Anesthesia was induced and antibiotics were administered.  The patient was then repositioned in the dorsolithotomy position.  He was prepped and draped in usual sterile fashion.  A timeout was performed.  Attempts at placing a 21 French rigid cystoscope and urethral meatus were met with resistance in the penile urethra.  Decision was made to place a 0.38 sensor wire through the cystoscope into the bladder.  The Optilume urethral balloon dilator was then used to dilate the anterior urethra to 30 [place] at 10 ATM.  This was held for approximately 1 minute.  The balloon was deflated and attempts at performing diagnostic cystoscopy were also met with  resistance.  The cystoscope was removed and repeat urethral dilation was performed in a similar manner.  This time it was held for 2 minutes.  The urethral balloon dilator was deflated and removed.  A 21 French rigid cystoscope was then used to perform diagnostic cystoscopy.  There is no noted bladder tumor recurrence.    The cystoscope was removed.  An 7 [place] council tip Foley catheter was placed over the wire and advanced the bladder into the catheter attempt.  The wire was removed and the catheter was inflated with 10 cc sterile water.  The patient emerged from anesthesia and transferred the PACU in stable condition.  Plan:  Patient to stay overnight because he has no one to stay with him at home following general anesthesia. Discharge tomorrow with foley.

## 2021-12-09 NOTE — Transfer of Care (Signed)
Immediate Anesthesia Transfer of Care Note  Patient: Eddie Graham  Procedure(s) Performed: CYSTOSCOPY WITH URETHRAL DILATATION  Patient Location: PACU  Anesthesia Type:General  Level of Consciousness: awake and patient cooperative  Airway & Oxygen Therapy: Patient Spontanous Breathing and Patient connected to face mask  Post-op Assessment: Report given to RN and Post -op Vital signs reviewed and stable  Post vital signs: Reviewed and stable  Last Vitals:  Vitals Value Taken Time  BP 99/59 12/09/21 0815  Temp    Pulse 69 12/09/21 0817  Resp 8 12/09/21 0817  SpO2 100 % 12/09/21 0817  Vitals shown include unvalidated device data.  Last Pain:  Vitals:   12/09/21 0618  TempSrc:   PainSc: 10-Worst pain ever      Patients Stated Pain Goal: 9 (90/30/09 2330)  Complications: No notable events documented.

## 2021-12-09 NOTE — Progress Notes (Signed)
Dr.Oddono made aware of CBG of 259 upon arrival today.  No further orders. HgbA1c is also elevated and Dr. Ambrose Pancoast states he spoke to the patient about getting his sugar regulated better.

## 2021-12-09 NOTE — Progress Notes (Addendum)
Spoke with patient's daughter Derry Skill (in [place]), provided phone for patient who spoke with daughter as well.  [name] ask patient to notify RN when he gets in touch with his ride home tomorrow.  VS, CBG checked.  Patient denies pain, nausea.  Lunch ordered for patient.  MD paged per diabetes coordinator request.  Angie Fava, RN

## 2021-12-09 NOTE — Progress Notes (Signed)
  Transition of Care (TOC) Screening Note   Patient Details  Name: Eddie Graham Date of Birth: 1935/02/22   Transition of Care [facility]) CM/SW Contact:    Roseanne Kaufman, RN Phone Number: 12/09/2021, 1:42 PM    Transition of Care Department [facility]) has reviewed patient and no TOC needs have been identified at this time. We will continue to monitor patient advancement through interdisciplinary progression rounds. If new patient transition needs arise, please place a TOC consult.

## 2021-12-09 NOTE — Anesthesia Postprocedure Evaluation (Signed)
Anesthesia Post Note  Patient: Eddie Graham  Procedure(s) Performed: CYSTOSCOPY WITH URETHRAL DILATATION     Patient location during evaluation: PACU Anesthesia Type: General Level of consciousness: awake and alert Pain management: pain level controlled Vital Signs Assessment: post-procedure vital signs reviewed and stable Respiratory status: spontaneous breathing, nonlabored ventilation, respiratory function stable and patient connected to nasal cannula oxygen Cardiovascular status: blood pressure returned to baseline and stable Postop Assessment: no apparent nausea or vomiting Anesthetic complications: no   No notable events documented.  Last Vitals:  Vitals:   12/09/21 1015 12/09/21 1030  BP: 116/62 (!) 108/58  Pulse: 65 64  Resp: 18   Temp:    SpO2: 98% 98%    Last Pain:  Vitals:   12/09/21 1030  TempSrc:   PainSc: 0-No pain                 Mersedes Alber

## 2021-12-09 NOTE — Interval H&P Note (Signed)
History and Physical Interval Note:  12/09/2021 7:19 AM  Eddie Graham  has presented today for surgery, with the diagnosis of URETHRAL STRICTURE, HISTORY OF BLADDER CANCER.  The various methods of treatment have been discussed with the patient and family. After consideration of risks, benefits and other options for treatment, the patient has consented to  Procedure(s) with comments: CYSTOSCOPY WITH URETHRAL DILATATION (N/A) - 1 HR TRANSURETHRAL RESECTION OF BLADDER TUMOR (TURBT) (N/A) as a surgical intervention.  The patient's history has been reviewed, patient examined, no change in status, stable for surgery.  I have reviewed the patient's chart and labs.  Questions were answered to the patient's satisfaction.     Jalei Shibley D Chaia Ikard

## 2021-12-09 NOTE — Discharge Instructions (Signed)
Cystoscopy patient instructions  Following a cystoscopy, a catheter (a flexible rubber tube) is sometimes left in place to empty the bladder. This may cause some discomfort or a feeling that you need to urinate. Your doctor determines the period of time that the catheter will be left in place. You may have bloody urine for two to three days (Call your doctor if the amount of bleeding increases or does not subside).  You may pass blood clots in your urine, especially if you had a biopsy. It is not unusual to pass small blood clots and have some bloody urine a couple of weeks after your cystoscopy. Again, call your doctor if the bleeding does not subside. You may have: Dysuria (painful urination) Frequency (urinating often) Urgency (strong desire to urinate)  These symptoms are common especially if medicine is instilled into the bladder or a ureteral stent is placed. Avoiding alcohol and caffeine, such as coffee, tea, and chocolate, may help relieve these symptoms. Drink plenty of water, unless otherwise instructed. Your doctor may also prescribe an antibiotic or other medicine to reduce these symptoms.  Cystoscopy results are available soon after the procedure; biopsy results usually take two to four days. Your doctor will discuss the results of your exam with you. Before you go home, you will be given specific instructions for follow-up care. Special Instructions:   If you are going home with a catheter in place do not take a tub bath until removed by your doctor.   You may resume your normal activities.   Do not drive or operate machinery if you are taking narcotic pain medicine.   Be sure to keep all follow-up appointments with your doctor.   Call Your Doctor If: The catheter is not draining You have severe pain You are unable to urinate You have a fever over 101 You have severe bleeding

## 2021-12-09 NOTE — Inpatient Diabetes Management (Signed)
Inpatient Diabetes Program Recommendations  AACE/ADA: New Consensus Statement on Inpatient Glycemic Control   Target Ranges:  Prepandial:   less than 140 mg/dL      Peak postprandial:   less than 180 mg/dL (1-2 hours)      Critically ill patients:  140 - 180 mg/dL    Latest Reference Range & Units 12/09/21 05:53 12/09/21 08:15  Glucose-Capillary 70 - 99 mg/dL 259 (H) 255 (H)    Latest Reference Range & Units 09/30/21 11:36 12/05/21 11:05  Hemoglobin A1C 4.8 - 5.6 % 13.2 (H) 14.2 (H)   Review of Glycemic Control  Diabetes history: DM2 Outpatient Diabetes medications: Glipizide 10 mg BID, Lantus 15 units QHS, Rybelsus 14 mg daily Current orders for Inpatient glycemic control: Semglee 15 units QHS, Glipizide 10 mg BID, Rybelsus 14 mg daily  Inpatient Diabetes Program Recommendations:    Insulin: Please order CBGs AC&HS and Novolog 0-15 units AC&HS.  Diet: May want to consider changing diet from Reg to Carb Modified if appropriate.  Thanks, Barnie Alderman, RN, MSN, [place] Diabetes Coordinator Inpatient Diabetes Program [phone] (Team Pager from 8am to [place])

## 2021-12-10 ENCOUNTER — Encounter (HOSPITAL_COMMUNITY): Payer: Self-pay | Admitting: Urology

## 2021-12-10 DIAGNOSIS — N35912 Unspecified bulbous urethral stricture, male: Secondary | ICD-10-CM | POA: Diagnosis not present

## 2021-12-10 LAB — GLUCOSE, CAPILLARY: Glucose-Capillary: 134 mg/dL — ABNORMAL HIGH (ref 70–99)

## 2021-12-10 MED ORDER — CEPHALEXIN 500 MG PO CAPS: 500.0000 mg | ORAL_CAPSULE | Freq: Three times a day (TID) | ORAL | 0 refills | Status: AC

## 2021-12-10 NOTE — Plan of Care (Signed)

## 2021-12-10 NOTE — Discharge Summary (Signed)
Date of admission: 12/09/2021  Date of discharge: 12/10/2021  Admission diagnosis: urethral stricture, bladder cancer  Discharge diagnosis: urethral stricture, bladder cancer  Secondary diagnoses:  Patient Active Problem List   Diagnosis Date Noted   Urethral stricture 12/09/2021   Bladder mass 09/30/2021   AAA (abdominal aortic aneurysm) without rupture ([place]) 08/05/2021    Procedures performed: Procedure(s): CYSTOSCOPY WITH URETHRAL DILATATION  History and Physical: For full details, please see admission history and physical. Briefly, Eddie Graham is a 87 y.o. year old patient with recurrent urethral stricture and bladder cancer.   Hospital Course: Patient tolerated the procedure well.  He was then transferred to the floor after an uneventful PACU stay due to inability to have someone stay with him after surgery.  His hospital course was uncomplicated.  On POD#1 he had met discharge criteria: was eating a regular diet, was up and ambulating independently,  pain was well controlled,  and was ready to for discharge.  Physical exam NAD, sleeping comfortably Foley draining clear yellow urine  Laboratory values:  No results for input(s): "WBC", "HGB", "HCT" in the last 72 hours. No results for input(s): "NA", "K", "CL", "CO2", "GLUCOSE", "BUN", "CREATININE", "CALCIUM" in the last 72 hours. No results for input(s): "LABPT", "INR" in the last 72 hours. No results for input(s): "LABURIN" in the last 72 hours. No results found for this or any previous visit.  Disposition: Home  Discharge instruction: The patient was instructed to be ambulatory but told to refrain from heavy lifting, strenuous activity, or driving.   Discharge medications:  Allergies as of 12/10/2021   No Known Allergies      Medication List     TAKE these medications    acetaminophen 500 MG tablet Commonly known as: TYLENOL Take 1,000 mg by mouth 2 (two) times daily as needed (pain).    albuterol 108 (90 Base) MCG/ACT inhaler Commonly known as: VENTOLIN HFA Inhale 2 puffs into the lungs every 4 (four) hours as needed for wheezing.   B-12 PO Take 1 capsule by mouth daily.   cephALEXin 500 MG capsule Commonly known as: KEFLEX Take 1 capsule (500 mg total) by mouth 3 (three) times daily for 5 days.   clopidogrel 75 MG tablet Commonly known as: PLAVIX Take 75 mg by mouth daily.   DULoxetine 60 MG capsule Commonly known as: CYMBALTA Take 60 mg by mouth daily.   EYE DROPS OP Place 2 drops into both eyes as needed (dryness/itching).   furosemide 40 MG tablet Commonly known as: LASIX Take 40 mg by mouth daily.   gabapentin 100 MG capsule Commonly known as: NEURONTIN Take 100 mg by mouth 2 (two) times daily.   glipiZIDE 10 MG tablet Commonly known as: GLUCOTROL Take 10 mg by mouth 2 (two) times daily before a meal.   HYDROcodone-acetaminophen 5-325 MG tablet Commonly known as: NORCO/VICODIN Take 1 tablet by mouth every 4 (four) hours as needed for moderate pain.   Lantus SoloStar 100 UNIT/ML Solostar Pen Generic drug: insulin glargine Inject 15 Units into the skin at bedtime.   lidocaine 5 % Commonly known as: Lidoderm Place 1 patch onto the skin daily. Remove & Discard patch within 12 hours or as directed by MD   metoprolol succinate 25 MG 24 hr tablet Commonly known as: TOPROL-XL Take 25 mg by mouth daily.   montelukast 10 MG tablet Commonly known as: SINGULAIR Take 10 mg by mouth daily.   OVER THE COUNTER MEDICATION Apply 1 Application topically 2 (two) times  daily as needed (pain). OTC pain cream po   pantoprazole 40 MG tablet Commonly known as: PROTONIX Take 40 mg by mouth daily as needed (heartburn).   rosuvastatin 40 MG tablet Commonly known as: CRESTOR Take 40 mg by mouth daily.   Semaglutide 14 MG Tabs Take 14 mg by mouth daily.   tamsulosin 0.4 MG Caps capsule Commonly known as: FLOMAX Take 0.4 mg by mouth daily.   traZODone  50 MG tablet Commonly known as: DESYREL Take 100 mg by mouth daily.   valsartan 80 MG tablet Commonly known as: DIOVAN Take 80 mg by mouth daily.        Followup:   Follow-up Information     [facility] Follow up on 12/12/2021.   Why: 8:15am Contact information: [address] [place] [phone]

## 2021-12-10 NOTE — Progress Notes (Signed)
Discharge instructions provided to and reviewed with patient and patient's friend.  Both verbalized understanding.  Foley catheter care and bag exchange instructions provided to patient with teach back method, patient demonstrated understanding.  Foley catheter supplies provided to patient.  PIV removed.  Patient transported to main entrance by wheelchair with belongings for transport home with friend.  Angie Fava, RN

## 2021-12-17 ENCOUNTER — Emergency Department (HOSPITAL_COMMUNITY): Payer: Medicare Other

## 2021-12-17 ENCOUNTER — Encounter (HOSPITAL_COMMUNITY): Payer: Self-pay

## 2021-12-17 ENCOUNTER — Observation Stay (HOSPITAL_COMMUNITY)
Admission: EM | Admit: 2021-12-17 | Discharge: 2021-12-20 | Disposition: A | Discharge status: Home Health | Payer: Medicare Other | Attending: Internal Medicine | Admitting: Internal Medicine

## 2021-12-17 ENCOUNTER — Other Ambulatory Visit: Payer: Self-pay

## 2021-12-17 DIAGNOSIS — E1140 Type 2 diabetes mellitus with diabetic neuropathy, unspecified: Secondary | ICD-10-CM | POA: Diagnosis not present

## 2021-12-17 DIAGNOSIS — Z794 Long term (current) use of insulin: Secondary | ICD-10-CM | POA: Insufficient documentation

## 2021-12-17 DIAGNOSIS — K625 Hemorrhage of anus and rectum: Secondary | ICD-10-CM | POA: Insufficient documentation

## 2021-12-17 DIAGNOSIS — Z7985 Long-term (current) use of injectable non-insulin antidiabetic drugs: Secondary | ICD-10-CM | POA: Insufficient documentation

## 2021-12-17 DIAGNOSIS — N179 Acute kidney failure, unspecified: Secondary | ICD-10-CM | POA: Diagnosis not present

## 2021-12-17 DIAGNOSIS — Z7984 Long term (current) use of oral hypoglycemic drugs: Secondary | ICD-10-CM | POA: Diagnosis not present

## 2021-12-17 DIAGNOSIS — Z7902 Long term (current) use of antithrombotics/antiplatelets: Secondary | ICD-10-CM | POA: Diagnosis not present

## 2021-12-17 DIAGNOSIS — I129 Hypertensive chronic kidney disease with stage 1 through stage 4 chronic kidney disease, or unspecified chronic kidney disease: Secondary | ICD-10-CM | POA: Insufficient documentation

## 2021-12-17 DIAGNOSIS — R1032 Left lower quadrant pain: Secondary | ICD-10-CM | POA: Diagnosis present

## 2021-12-17 DIAGNOSIS — J449 Chronic obstructive pulmonary disease, unspecified: Secondary | ICD-10-CM | POA: Insufficient documentation

## 2021-12-17 DIAGNOSIS — N1832 Chronic kidney disease, stage 3b: Secondary | ICD-10-CM | POA: Insufficient documentation

## 2021-12-17 DIAGNOSIS — J45909 Unspecified asthma, uncomplicated: Secondary | ICD-10-CM | POA: Diagnosis not present

## 2021-12-17 DIAGNOSIS — E118 Type 2 diabetes mellitus with unspecified complications: Secondary | ICD-10-CM

## 2021-12-17 DIAGNOSIS — R739 Hyperglycemia, unspecified: Secondary | ICD-10-CM

## 2021-12-17 DIAGNOSIS — Z79899 Other long term (current) drug therapy: Secondary | ICD-10-CM | POA: Insufficient documentation

## 2021-12-17 DIAGNOSIS — K5732 Diverticulitis of large intestine without perforation or abscess without bleeding: Principal | ICD-10-CM | POA: Insufficient documentation

## 2021-12-17 DIAGNOSIS — Z8551 Personal history of malignant neoplasm of bladder: Secondary | ICD-10-CM | POA: Insufficient documentation

## 2021-12-17 DIAGNOSIS — E861 Hypovolemia: Secondary | ICD-10-CM | POA: Insufficient documentation

## 2021-12-17 DIAGNOSIS — E871 Hypo-osmolality and hyponatremia: Secondary | ICD-10-CM | POA: Insufficient documentation

## 2021-12-17 DIAGNOSIS — E1122 Type 2 diabetes mellitus with diabetic chronic kidney disease: Secondary | ICD-10-CM | POA: Insufficient documentation

## 2021-12-17 DIAGNOSIS — E1165 Type 2 diabetes mellitus with hyperglycemia: Secondary | ICD-10-CM | POA: Diagnosis not present

## 2021-12-17 DIAGNOSIS — K5792 Diverticulitis of intestine, part unspecified, without perforation or abscess without bleeding: Secondary | ICD-10-CM

## 2021-12-17 LAB — URINALYSIS, ROUTINE W REFLEX MICROSCOPIC
Bacteria, UA: NONE SEEN
Bilirubin Urine: NEGATIVE
Glucose, UA: >=500 mg/dL — AB
Hgb urine dipstick: NEGATIVE
Ketones, ur: NEGATIVE mg/dL
Leukocytes,Ua: NEGATIVE
Nitrite: NEGATIVE
Protein, ur: NEGATIVE mg/dL
Specific Gravity, Urine: 1.017 (ref 1.005–1.030)
pH: 5 (ref 5.0–8.0)

## 2021-12-17 LAB — COMPREHENSIVE METABOLIC PANEL
ALT: 17 U/L (ref 0–44)
AST: 11 U/L — ABNORMAL LOW (ref 15–41)
Albumin: 3.3 g/dL — ABNORMAL LOW (ref 3.5–5.0)
Alkaline Phosphatase: 70 U/L (ref 38–126)
Anion gap: 11 (ref 5–15)
BUN: 36 mg/dL — ABNORMAL HIGH (ref 8–23)
CO2: 24 mmol/L (ref 22–32)
Calcium: 8.9 mg/dL (ref 8.9–10.3)
Chloride: 95 mmol/L — ABNORMAL LOW (ref 98–111)
Creatinine, Ser: 1.86 mg/dL — ABNORMAL HIGH (ref 0.61–1.24)
GFR, Estimated: 35 mL/min — ABNORMAL LOW (ref >60)
Glucose, Bld: 476 mg/dL — ABNORMAL HIGH (ref 70–99)
Potassium: 4.9 mmol/L (ref 3.5–5.1)
Sodium: 130 mmol/L — ABNORMAL LOW (ref 135–145)
Total Bilirubin: 0.8 mg/dL (ref 0.3–1.2)
Total Protein: 7 g/dL (ref 6.5–8.1)

## 2021-12-17 LAB — CBC
HCT: 41.3 % (ref 39.0–52.0)
Hemoglobin: 13.6 g/dL (ref 13.0–17.0)
MCH: 28.1 pg (ref 26.0–34.0)
MCHC: 32.9 g/dL (ref 30.0–36.0)
MCV: 85.3 fL (ref 80.0–100.0)
Platelets: 292 10*3/uL (ref 150–400)
RBC: 4.84 MIL/uL (ref 4.22–5.81)
RDW: 13.3 % (ref 11.5–15.5)
WBC: 9.3 10*3/uL (ref 4.0–10.5)
nRBC: 0 % (ref 0.0–0.2)

## 2021-12-17 LAB — TYPE AND SCREEN
ABO/RH(D): A POS
Antibody Screen: NEGATIVE

## 2021-12-17 LAB — TROPONIN I (HIGH SENSITIVITY)
Troponin I (High Sensitivity): 5 ng/L (ref <18)
Troponin I (High Sensitivity): 5 ng/L (ref <18)

## 2021-12-17 LAB — LIPASE, BLOOD: Lipase: 36 U/L (ref 11–51)

## 2021-12-17 LAB — CBG MONITORING, ED: Glucose-Capillary: 375 mg/dL — ABNORMAL HIGH (ref 70–99)

## 2021-12-17 MED ORDER — METRONIDAZOLE 500 MG/100ML IV SOLN
500.0000 mg | Freq: Once | INTRAVENOUS | Status: AC
Start: 2021-12-17 — End: 2021-12-17
  Administered 2021-12-17: 500 mg via INTRAVENOUS
  Filled 2021-12-17: qty 100

## 2021-12-17 MED ORDER — INSULIN ASPART 100 UNIT/ML IJ SOLN
6.0000 [IU] | Freq: Once | INTRAMUSCULAR | Status: AC
  Administered 2021-12-17: 6 [IU] via SUBCUTANEOUS
  Filled 2021-12-17: qty 0.06

## 2021-12-17 MED ORDER — SODIUM CHLORIDE 0.9 % IV SOLN
1000.0000 mL | INTRAVENOUS | Status: DC
  Administered 2021-12-17: 1000 mL via INTRAVENOUS

## 2021-12-17 MED ORDER — SODIUM CHLORIDE 0.9 % IV BOLUS (SEPSIS)
500.0000 mL | Freq: Once | INTRAVENOUS | Status: AC
  Administered 2021-12-17: 500 mL via INTRAVENOUS

## 2021-12-17 MED ORDER — SODIUM CHLORIDE 0.9 % IV SOLN
2.0000 g | Freq: Once | INTRAVENOUS | Status: AC
  Administered 2021-12-17: 2 g via INTRAVENOUS
  Filled 2021-12-17: qty 20

## 2021-12-17 NOTE — H&P (Incomplete)
History and Physical  Eddie Graham HEN:[id number] DOB: 06/28/1934 DOA: 12/17/2021  Referring physician: Dr. Tomi Bamberger, EDP PCP: Eddie Graham  Outpatient Specialists: Urology Patient coming from: Home.  Chief Complaint: Abdominal pain, rectal bleeding.  HPI: Eddie Graham is a 87 y.o. male with medical history significant for bladder cancer, urethral stricture status post cystoscopy with ureteral dilatation on 12/09/2021, AAA, who presented to the [facility] ED from home with complaints of left lower quadrant abdominal pain and intermittent rectal bleeding of 3 days duration.  Work-up in the ED revealed sigmoid diverticulitis seen on CT abdomen pelvis without contrast.  No evidence of perforation or abscess.  Started on empiric Rocephin and IV Flagyl in the ED.  The patient was admitted by the hospitalist service, TR[facility].  ED Course: Tmax 97.5.  BP 123/67, pulse 68, respiration rate 17, O2 saturation 98% on room air.  Lab studies significant for serum sodium 138, glucose 476, BUN 26, creatinine 1.86, GFR 35, troponin 5, repeated 5.  CBC essentially unremarkable.  Review of Systems: Review of systems as noted in the HPI. All other systems reviewed and are negative.   Past Medical History:  Diagnosis Date   AAA (abdominal aortic aneurysm) (H[facility])    Anemia    Anxiety    Arthritis    Asthma    Back pain    Chronic kidney disease    COPD (chronic obstructive pulmonary disease) ([place])    Depression    Diabetes mellitus without complication ([place])    Dyspnea    Hypertension    Insomnia    Past Surgical History:  Procedure Laterality Date   CARDIAC SURGERY     CYSTOSCOPY WITH URETHRAL DILATATION N/A 09/30/2021   Procedure: CYSTOSCOPY WITH  URETHRAL BALLOON  DILATATION;  Surgeon: Robley Fries, Graham;  Location: [facility];  Service: Urology;  Laterality: N/A;   CYSTOSCOPY WITH URETHRAL DILATATION N/A 12/09/2021   Procedure: CYSTOSCOPY WITH URETHRAL DILATATION;  Surgeon: Robley Fries, Graham;  Location: [facility];  Service: Urology;  Laterality: N/A;  1 HR   PROSTATE SURGERY     TRANSURETHRAL RESECTION OF BLADDER TUMOR N/A 09/30/2021   Procedure: TRANSURETHRAL RESECTION OF BLADDER TUMOR (TURBT);  Surgeon: Robley Fries, Graham;  Location: [facility];  Service: Urology;  Laterality: N/A;  1 MINS    Social History:  reports that he has never smoked. He has never used smokeless tobacco. He reports that he does not currently use alcohol. He reports that he does not currently use drugs.   No Known Allergies  Family history:   Prior to Admission medications   Medication Sig Start Date End Date Taking? Authorizing Provider  acetaminophen (TYLENOL) 500 MG tablet Take 1,000 mg by mouth 2 (two) times daily as needed (pain).    Provider, Historical, Graham  albuterol (VENTOLIN HFA) 108 (90 Base) MCG/ACT inhaler Inhale 2 puffs into the lungs every 4 (four) hours as needed for wheezing. Patient not taking: Reported on 12/05/2021 11/11/20   Provider, Historical, Graham  Carboxymethylcellulose Sodium (EYE DROPS OP) Place 2 drops into both eyes as needed (dryness/itching).    Provider, Historical, Graham  clopidogrel (PLAVIX) 75 MG tablet Take 75 mg by mouth daily. 01/24/21   Provider, Historical, Graham  Cyanocobalamin (B-12 PO) Take 1 capsule by mouth daily.    Provider, Historical, Graham  DULoxetine (CYMBALTA) 60 MG capsule Take 60 mg by mouth daily. 02/07/21   Provider, Historical, Graham  furosemide (LASIX) 40 MG tablet Take 40 mg by mouth  daily. 10/21/21   Provider, Historical, Graham  gabapentin (NEURONTIN) 100 MG capsule Take 100 mg by mouth 2 (two) times daily. 02/25/21   Provider, Historical, Graham  glipiZIDE (GLUCOTROL) 10 MG tablet Take 10 mg by mouth 2 (two) times daily before a meal. 03/25/21   Provider, Historical, Graham  HYDROcodone-acetaminophen (NORCO/VICODIN) 5-325 MG tablet Take 1 tablet by mouth every 4 (four) hours as needed for moderate pain. Patient not taking: Reported on 12/05/2021 09/30/21 09/30/22   Robley Fries, Graham  LANTUS SOLOSTAR 100 UNIT/ML Solostar Pen Inject 15 Units into the skin at bedtime. 09/07/21   Provider, Historical, Graham  lidocaine (LIDODERM) 5 % Place 1 patch onto the skin daily. Remove & Discard patch within 12 hours or as directed by Graham 11/01/21   Blue, Soijett A, PA-C  metoprolol succinate (TOPROL-XL) 25 MG 24 hr tablet Take 25 mg by mouth daily. 04/07/21   Provider, Historical, Graham  montelukast (SINGULAIR) 10 MG tablet Take 10 mg by mouth daily. 03/19/21   Provider, Historical, Graham  OVER THE COUNTER MEDICATION Apply 1 Application topically 2 (two) times daily as needed (pain). OTC pain cream po    Provider, Historical, Graham  pantoprazole (PROTONIX) 40 MG tablet Take 40 mg by mouth daily as needed (heartburn). 02/14/21   Provider, Historical, Graham  rosuvastatin (CRESTOR) 40 MG tablet Take 40 mg by mouth daily. 04/07/21   Provider, Historical, Graham  Semaglutide 14 MG TABS Take 14 mg by mouth daily. 03/14/21   Provider, Historical, Graham  tamsulosin (FLOMAX) 0.4 MG CAPS capsule Take 0.4 mg by mouth daily. Patient not taking: Reported on 12/05/2021 11/23/21   Provider, Historical, Graham  traZODone (DESYREL) 50 MG tablet Take 100 mg by mouth daily. 03/14/21   Provider, Historical, Graham  valsartan (DIOVAN) 80 MG tablet Take 80 mg by mouth daily. Patient not taking: Reported on 12/05/2021 03/14/21   Provider, Historical, Graham    Physical Exam: BP 123/67   Pulse 71   Temp (!) 97.4 F (36.3 C)   Resp 19   SpO2 97%   General: 87 y.o. year-old male well developed well nourished in no acute distress.  Alert and oriented x3. Cardiovascular: Regular rate and rhythm with no rubs or gallops.  No thyromegaly or JVD noted.  No lower extremity edema. 2/4 pulses in all 4 extremities. Respiratory: Clear to auscultation with no wheezes or rales. Good inspiratory effort. Abdomen: Soft nontender nondistended with normal bowel sounds x4 quadrants. Muskuloskeletal: No cyanosis, clubbing or edema noted  bilaterally Neuro: CN II-XII intact, strength, sensation, reflexes Skin: No ulcerative lesions noted or rashes Psychiatry: Judgement and insight appear normal. Mood is appropriate for condition and setting          Labs on Admission:  Basic Metabolic Panel: Recent Labs  Lab 12/17/21 1526  NA 130*  K 4.9  CL 95*  CO2 24  GLUCOSE 476*  BUN 36*  CREATININE 1.86*  CALCIUM 8.9   Liver Function Tests: Recent Labs  Lab 12/17/21 1526  AST 11*  ALT 17  ALKPHOS 70  BILITOT 0.8  PROT 7.0  ALBUMIN 3.3*   Recent Labs  Lab 12/17/21 1526  LIPASE 36   No results for input(s): "AMMONIA" in the last 168 hours. CBC: Recent Labs  Lab 12/17/21 1526  WBC 9.3  HGB 13.6  HCT 41.3  MCV 85.3  PLT 292   Cardiac Enzymes: No results for input(s): "CKTOTAL", "CKMB", "CKMBINDEX", "TROPONINI" in the last 168 hours.  BNP (last 3 results)  No results for input(s): "BNP" in the last 8760 hours.  ProBNP (last 3 results) No results for input(s): "PROBNP" in the last 8760 hours.  CBG: Recent Labs  Lab 12/17/21 1859  GLUCAP 375*    Radiological Exams on Admission: CT ABDOMEN PELVIS WO CONTRAST  Result Date: 12/17/2021 CLINICAL DATA:  Abdominal pain and rectal bleeding. EXAM: CT ABDOMEN AND PELVIS WITHOUT CONTRAST TECHNIQUE: Multidetector CT imaging of the abdomen and pelvis was performed following the standard protocol without IV contrast. RADIATION DOSE REDUCTION: This exam was performed according to the departmental dose-optimization program which includes automated exposure control, adjustment of the mA and/or kV according to patient size and/or use of iterative reconstruction technique. COMPARISON:  Sep 01, 2021 FINDINGS: Lower chest: No acute abnormality. Hepatobiliary: No focal liver abnormality is seen. No gallstones, gallbladder wall thickening, or biliary dilatation. Pancreas: Unremarkable. No pancreatic ductal dilatation or surrounding inflammatory changes. Spleen: Normal in size  without focal abnormality. Adrenals/Urinary Tract: Adrenal glands are unremarkable. Kidneys are normal in size and mildly lobulated in appearance, without renal calculi, focal lesion, or hydronephrosis. Mild urinary bladder wall thickening is seen. Stomach/Bowel: Stomach is within normal limits. Appendix appears normal. No evidence of bowel dilatation. Numerous diverticula are seen throughout the large bowel. Large moderate to markedly inflamed diverticula are noted within the proximal to mid sigmoid colon. There is no evidence of associated perforation or abscess. Vascular/Lymphatic: Aortic atherosclerosis. No enlarged abdominal or pelvic lymph nodes. Reproductive: Small calcifications are seen within an otherwise normal appearing prostate gland. Other: No abdominal wall hernia or abnormality. No abdominopelvic ascites. Musculoskeletal: Multilevel degenerative changes are seen throughout the lumbar spine. IMPRESSION: 1. Sigmoid diverticulitis. 2. Mild urinary bladder wall thickening, which may represent sequelae associated with cystitis. Correlation with urinalysis is recommended. 3. Aortic atherosclerosis. Aortic Atherosclerosis (ICD10-I70.0). Electronically Signed   By: Virgina Norfolk M.D.   On: 12/17/2021 19:53    EKG: I independently viewed the EKG done and my findings are as followed: ***   Assessment/Plan Present on Admission:  Sigmoid diverticulitis  Principal Problem:   Sigmoid diverticulitis   DVT prophylaxis: ***   Code Status: ***   Family Communication: ***   Disposition Plan: ***   Consults called: ***   Admission status: ***    Status is: Observation {Observation:23811}   Kayleen Memos Graham [facility] Pager [phone]  If 7PM-7AM, please contact night-coverage [email or web address] Password [facility]  12/17/2021, 9:35 PM

## 2021-12-17 NOTE — ED Notes (Signed)
Pt transported to CT ?

## 2021-12-17 NOTE — ED Provider Triage Note (Signed)
Emergency Medicine Provider Triage Evaluation Note  Rhythm Wigfall , a 87 y.o. male  was evaluated in triage.  Pt complains of rectal bleeding x3 days.  States that has improved today.  Endorses abdominal pain as well as some chest discomfort.  Without palpitations, shortness of breath, or lightheadedness.  States he has history of the same.  Review of Systems  Positive: As above Negative: As above  Physical Exam  BP 109/65 (BP Location: Right Arm)   Pulse 73   Temp (!) 97.5 F (36.4 C) (Oral)   Resp 18   SpO2 91%  Gen:   Awake, no distress   Resp:  Normal effort  MSK:   Moves extremities without difficulty  Other:  Mild abdominal tenderness present  Medical Decision Making  Medically screening exam initiated at 3:05 PM.  Appropriate orders placed.  Deaire Sanchez-Feliciano was informed that the remainder of the evaluation will be completed by another provider, this initial triage assessment does not replace that evaluation, and the importance of remaining in the ED until their evaluation is complete.     Evlyn Courier, PA-C 12/17/21 1505

## 2021-12-17 NOTE — ED Triage Notes (Signed)
Pt BIB EMS from home. Pt endorses colon pain x3 days. Pt attempted to have a BM and noticed bleeding coming from rectum. Pt reports he is able to have BM. Denies N/V/D.

## 2021-12-17 NOTE — ED Provider Notes (Signed)
[facility] DEPT Provider Note   CSN: [id number] Arrival date & time: 12/17/21  1430     History  Chief Complaint  Patient presents with   Abdominal Pain   Rectal Bleeding    Eddie Graham is a 87 y.o. male.   Abdominal Pain Associated symptoms: hematochezia   Rectal Bleeding Associated symptoms: abdominal pain      Patient presented to the ED for evaluation of abdominal pain bloating ongoing for the last 3 days.  Patient also has noticed some blood in his stool when having a bowel movement earlier today he has not had any vomiting.  Has not had any fevers.  The bloating is somewhat improved today.  He said some of the discomfort go up towards his chest but he is not have any shortness of breath or palpitations.  Home Medications Prior to Admission medications   Medication Sig Start Date End Date Taking? Authorizing Provider  acetaminophen (TYLENOL) 500 MG tablet Take 1,000 mg by mouth 2 (two) times daily as needed (pain).    [provider]  albuterol (VENTOLIN HFA) 108 (90 Base) MCG/ACT inhaler Inhale 2 puffs into the lungs every 4 (four) hours as needed for wheezing. Patient not taking: Reported on 12/05/2021 11/11/20   [provider]  Carboxymethylcellulose Sodium (EYE DROPS OP) Place 2 drops into both eyes as needed (dryness/itching).    [provider]  clopidogrel (PLAVIX) 75 MG tablet Take 75 mg by mouth daily. 01/24/21   [provider]  Cyanocobalamin (B-12 PO) Take 1 capsule by mouth daily.    [provider]  DULoxetine (CYMBALTA) 60 MG capsule Take 60 mg by mouth daily. 02/07/21   [provider]  furosemide (LASIX) 40 MG tablet Take 40 mg by mouth daily. 10/21/21   [provider]  gabapentin (NEURONTIN) 100 MG capsule Take 100 mg by mouth 2 (two) times daily. 02/25/21   [provider]  glipiZIDE (GLUCOTROL) 10 MG tablet Take 10 mg by mouth 2 (two) times  daily before a meal. 03/25/21   [provider]  HYDROcodone-acetaminophen (NORCO/VICODIN) 5-325 MG tablet Take 1 tablet by mouth every 4 (four) hours as needed for moderate pain. Patient not taking: Reported on 12/05/2021 09/30/21 09/30/22  Robley Fries, MD  LANTUS SOLOSTAR 100 UNIT/ML Solostar Pen Inject 15 Units into the skin at bedtime. 09/07/21   [provider]  lidocaine (LIDODERM) 5 % Place 1 patch onto the skin daily. Remove & Discard patch within 12 hours or as directed by MD 11/01/21   Blue, Soijett A, PA-C  metoprolol succinate (TOPROL-XL) 25 MG 24 hr tablet Take 25 mg by mouth daily. 04/07/21   [provider]  montelukast (SINGULAIR) 10 MG tablet Take 10 mg by mouth daily. 03/19/21   [provider]  OVER THE COUNTER MEDICATION Apply 1 Application topically 2 (two) times daily as needed (pain). OTC pain cream po    [provider]  pantoprazole (PROTONIX) 40 MG tablet Take 40 mg by mouth daily as needed (heartburn). 02/14/21   [provider]  rosuvastatin (CRESTOR) 40 MG tablet Take 40 mg by mouth daily. 04/07/21   [provider]  Semaglutide 14 MG TABS Take 14 mg by mouth daily. 03/14/21   [provider]  tamsulosin (FLOMAX) 0.4 MG CAPS capsule Take 0.4 mg by mouth daily. Patient not taking: Reported on 12/05/2021 11/23/21   [provider]  traZODone (DESYREL) 50 MG tablet Take 100 mg by mouth  daily. 03/14/21   [provider]  valsartan (DIOVAN) 80 MG tablet Take 80 mg by mouth daily. Patient not taking: Reported on 12/05/2021 03/14/21   [provider]      Allergies    Patient has no known allergies.    Review of Systems   Review of Systems  Gastrointestinal:  Positive for abdominal pain and hematochezia.    Physical Exam Updated Vital Signs BP 123/67   Pulse 71   Temp (!) 97.4 F (36.3 C)   Resp 19   SpO2 97%  Physical Exam Vitals and nursing note reviewed.   Constitutional:      General: He is not in acute distress.    Appearance: He is well-developed.  HENT:     Head: Normocephalic and atraumatic.     Right Ear: External ear normal.     Left Ear: External ear normal.  Eyes:     General: No scleral icterus.       Right eye: No discharge.        Left eye: No discharge.     Conjunctiva/sclera: Conjunctivae normal.  Neck:     Trachea: No tracheal deviation.  Cardiovascular:     Rate and Rhythm: Normal rate and regular rhythm.  Pulmonary:     Effort: Pulmonary effort is normal. No respiratory distress.     Breath sounds: Normal breath sounds. No stridor. No wheezing or rales.  Abdominal:     General: Abdomen is protuberant. Bowel sounds are normal.     Palpations: Abdomen is soft.     Tenderness: There is generalized abdominal tenderness. There is no guarding or rebound.  Musculoskeletal:        General: No tenderness or deformity.     Cervical back: Neck supple.  Skin:    General: Skin is warm and dry.     Findings: No rash.  Neurological:     General: No focal deficit present.     Mental Status: He is alert.     Cranial Nerves: No cranial nerve deficit (no facial droop, extraocular movements intact, no slurred speech).     Sensory: No sensory deficit.     Motor: No abnormal muscle tone or seizure activity.     Coordination: Coordination normal.  Psychiatric:        Mood and Affect: Mood normal.     ED Results / Procedures / Treatments   Labs (all labs ordered are listed, but only abnormal results are displayed) Labs Reviewed  COMPREHENSIVE METABOLIC PANEL - Abnormal; Notable for the following components:      Result Value   Sodium 130 (*)    Chloride 95 (*)    Glucose, Bld 476 (*)    BUN 36 (*)    Creatinine, Ser 1.86 (*)    Albumin 3.3 (*)    AST 11 (*)    GFR, Estimated 35 (*)    All other components within normal limits  URINALYSIS, ROUTINE W REFLEX MICROSCOPIC - Abnormal; Notable for the following components:    Glucose, UA >=500 (*)    All other components within normal limits  CBG MONITORING, ED - Abnormal; Notable for the following components:   Glucose-Capillary 375 (*)    All other components within normal limits  LIPASE, BLOOD  CBC  TYPE AND SCREEN  TROPONIN I (HIGH SENSITIVITY)  TROPONIN I (HIGH SENSITIVITY)    EKG EKG Interpretation  Date/Time:  Wednesday December 17 2021 15:19:08 EDT Ventricular Rate:  74 PR Interval:  155 QRS Duration: 90 QT Interval:  388 QTC Calculation: 431 R Axis:   -53 Text Interpretation: Sinus rhythm Left anterior fascicular block Abnormal R-wave progression, early transition Consider anterior infarct Abnormal T, consider ischemia, diffuse leads No significant change since last tracing Confirmed by Dorie Rank [id number]) on 12/17/2021 7:26:53 PM  Radiology CT ABDOMEN PELVIS WO CONTRAST  Result Date: 12/17/2021 CLINICAL DATA:  Abdominal pain and rectal bleeding. EXAM: CT ABDOMEN AND PELVIS WITHOUT CONTRAST TECHNIQUE: Multidetector CT imaging of the abdomen and pelvis was performed following the standard protocol without IV contrast. RADIATION DOSE REDUCTION: This exam was performed according to the departmental dose-optimization program which includes automated exposure control, adjustment of the mA and/or kV according to patient size and/or use of iterative reconstruction technique. COMPARISON:  Sep 01, 2021 FINDINGS: Lower chest: No acute abnormality. Hepatobiliary: No focal liver abnormality is seen. No gallstones, gallbladder wall thickening, or biliary dilatation. Pancreas: Unremarkable. No pancreatic ductal dilatation or surrounding inflammatory changes. Spleen: Normal in size without focal abnormality. Adrenals/Urinary Tract: Adrenal glands are unremarkable. Kidneys are normal in size and mildly lobulated in appearance, without renal calculi, focal lesion, or hydronephrosis. Mild urinary bladder wall thickening is seen. Stomach/Bowel: Stomach is within normal  limits. Appendix appears normal. No evidence of bowel dilatation. Numerous diverticula are seen throughout the large bowel. Large moderate to markedly inflamed diverticula are noted within the proximal to mid sigmoid colon. There is no evidence of associated perforation or abscess. Vascular/Lymphatic: Aortic atherosclerosis. No enlarged abdominal or pelvic lymph nodes. Reproductive: Small calcifications are seen within an otherwise normal appearing prostate gland. Other: No abdominal wall hernia or abnormality. No abdominopelvic ascites. Musculoskeletal: Multilevel degenerative changes are seen throughout the lumbar spine. IMPRESSION: 1. Sigmoid diverticulitis. 2. Mild urinary bladder wall thickening, which may represent sequelae associated with cystitis. Correlation with urinalysis is recommended. 3. Aortic atherosclerosis. Aortic Atherosclerosis (ICD10-I70.0). Electronically Signed   By: Virgina Norfolk M.D.   On: 12/17/2021 19:53    Procedures Procedures    Medications Ordered in ED Medications  sodium chloride 0.9 % bolus 500 mL (0 mLs Intravenous Stopped 12/17/21 1946)    Followed by  0.9 %  sodium chloride infusion (1,000 mLs Intravenous New Bag/Given 12/17/21 1944)  cefTRIAXone (ROCEPHIN) 2 g in sodium chloride 0.9 % 100 mL IVPB (has no administration in time range)    And  metroNIDAZOLE (FLAGYL) IVPB 500 mg (has no administration in time range)  insulin aspart (novoLOG) injection 6 Units (6 Units Subcutaneous Given 12/17/21 1903)    ED Course/ Medical Decision Making/ A&P Clinical Course as of 12/17/21 2134  Wed Dec 17, 2021  1925 Comprehensive metabolic panel(!) Hyponatremia and hyperglycemia noted [JK]  1926 Comprehensive metabolic panel(!) Creatinine elevated compared to 1 month ago [JK]  1926 CBC Normal [JK]  1926 Troponin I (High Sensitivity) Normal [JK]  2055 CT ABDOMEN PELVIS WO CONTRAST CT scan shows findings consistent with sigmoid diverticulitis.  Findings also suggest  possible cystitis although urinalysis does not suggest definitive urinary tract infection [JK]  2133 Case discussed with Dr. Nevada Crane regarding admission [JK]    Clinical Course User Index [JK] Dorie Rank, MD                           Medical Decision Making Problems Addressed: AKI (acute kidney injury) [facility]): acute illness or injury Diverticulitis: acute illness or injury that poses a threat to life or bodily functions Hyperglycemia: acute illness or injury  Amount and/or  Complexity of Data Reviewed Labs: ordered. Decision-making details documented in ED Course. Radiology: ordered. Decision-making details documented in ED Course.  Risk Prescription drug management.  Patient presented to ED for evaluation of abdominal pain and rectal bleeding.  Patient's labs were notable for hyperglycemia.  Increase in his BUN and creatinine consistent with an AKI.  No significant leukocytosis.  No drop in his white count.  With his abdominal pain CT scan was performed.  CT scan shows evidence of diverticulitis.  No evidence of abscess or free air.  With his age and comorbidities I think it is reasonable to start him on IV antibiotics and monitor overnight to ensure he is improving and does not have any further rectal bleeding.         Final Clinical Impression(s) / ED Diagnoses Final diagnoses:  Diverticulitis  Hyperglycemia  AKI (acute kidney injury) [facility])    Rx / DC Orders ED Discharge Orders     None         Dorie Rank, MD 12/17/21 2134

## 2021-12-17 NOTE — ED Notes (Signed)
Pt back from CT

## 2021-12-18 DIAGNOSIS — K5732 Diverticulitis of large intestine without perforation or abscess without bleeding: Secondary | ICD-10-CM | POA: Diagnosis not present

## 2021-12-18 LAB — CBG MONITORING, ED
Glucose-Capillary: 216 mg/dL — ABNORMAL HIGH (ref 70–99)
Glucose-Capillary: 142 mg/dL — ABNORMAL HIGH (ref 70–99)
Glucose-Capillary: 279 mg/dL — ABNORMAL HIGH (ref 70–99)

## 2021-12-18 LAB — CBC
HCT: 37.9 % — ABNORMAL LOW (ref 39.0–52.0)
Hemoglobin: 12.2 g/dL — ABNORMAL LOW (ref 13.0–17.0)
MCH: 27.9 pg (ref 26.0–34.0)
MCHC: 32.2 g/dL (ref 30.0–36.0)
MCV: 86.5 fL (ref 80.0–100.0)
Platelets: 244 10*3/uL (ref 150–400)
RBC: 4.38 MIL/uL (ref 4.22–5.81)
RDW: 13.2 % (ref 11.5–15.5)
WBC: 7.6 10*3/uL (ref 4.0–10.5)
nRBC: 0 % (ref 0.0–0.2)

## 2021-12-18 LAB — BASIC METABOLIC PANEL
Anion gap: 8 (ref 5–15)
BUN: 33 mg/dL — ABNORMAL HIGH (ref 8–23)
CO2: 25 mmol/L (ref 22–32)
Calcium: 8.4 mg/dL — ABNORMAL LOW (ref 8.9–10.3)
Chloride: 103 mmol/L (ref 98–111)
Creatinine, Ser: 1.53 mg/dL — ABNORMAL HIGH (ref 0.61–1.24)
GFR, Estimated: 45 mL/min — ABNORMAL LOW (ref >60)
Glucose, Bld: 244 mg/dL — ABNORMAL HIGH (ref 70–99)
Potassium: 4.2 mmol/L (ref 3.5–5.1)
Sodium: 136 mmol/L (ref 135–145)

## 2021-12-18 LAB — GLUCOSE, CAPILLARY
Glucose-Capillary: 208 mg/dL — ABNORMAL HIGH (ref 70–99)
Glucose-Capillary: 239 mg/dL — ABNORMAL HIGH (ref 70–99)

## 2021-12-18 LAB — MAGNESIUM: Magnesium: 2.4 mg/dL (ref 1.7–2.4)

## 2021-12-18 MED ORDER — SIMETHICONE 80 MG PO CHEW: 80.0000 mg | CHEWABLE_TABLET | Freq: Four times a day (QID) | ORAL | Status: DC | PRN

## 2021-12-18 MED ORDER — INSULIN ASPART 100 UNIT/ML IJ SOLN
0.0000 [IU] | Freq: Every day | INTRAMUSCULAR | Status: DC
  Administered 2021-12-18 – 2021-12-19 (×3): 2 [IU] via SUBCUTANEOUS
  Filled 2021-12-18: qty 0.05

## 2021-12-18 MED ORDER — INSULIN DETEMIR 100 UNIT/ML ~~LOC~~ SOLN
6.0000 [IU] | Freq: Every day | SUBCUTANEOUS | Status: DC
  Administered 2021-12-18 – 2021-12-20 (×3): 6 [IU] via SUBCUTANEOUS
  Filled 2021-12-18 (×3): qty 0.06

## 2021-12-18 MED ORDER — ACETAMINOPHEN 325 MG PO TABS
650.0000 mg | ORAL_TABLET | Freq: Four times a day (QID) | ORAL | Status: DC | PRN
  Administered 2021-12-19 (×2): 650 mg via ORAL
  Filled 2021-12-18 (×2): qty 2

## 2021-12-18 MED ORDER — ROSUVASTATIN CALCIUM 20 MG PO TABS
40.0000 mg | ORAL_TABLET | Freq: Every day | ORAL | Status: DC
  Administered 2021-12-18 – 2021-12-20 (×3): 40 mg via ORAL
  Filled 2021-12-18 (×3): qty 2

## 2021-12-18 MED ORDER — INSULIN ASPART 100 UNIT/ML IJ SOLN
0.0000 [IU] | Freq: Three times a day (TID) | INTRAMUSCULAR | Status: DC
  Administered 2021-12-18: 5 [IU] via SUBCUTANEOUS
  Administered 2021-12-18: 3 [IU] via SUBCUTANEOUS
  Administered 2021-12-19: 2 [IU] via SUBCUTANEOUS
  Administered 2021-12-19: 3 [IU] via SUBCUTANEOUS
  Administered 2021-12-19: 2 [IU] via SUBCUTANEOUS
  Administered 2021-12-20: 7 [IU] via SUBCUTANEOUS
  Administered 2021-12-20: 3 [IU] via SUBCUTANEOUS
  Filled 2021-12-18: qty 0.09

## 2021-12-18 MED ORDER — TRAZODONE HCL 50 MG PO TABS
50.0000 mg | ORAL_TABLET | Freq: Every evening | ORAL | Status: DC | PRN
  Administered 2021-12-19 (×2): 50 mg via ORAL
  Filled 2021-12-18 (×2): qty 1

## 2021-12-18 MED ORDER — SODIUM CHLORIDE 0.9 % IV SOLN
2.0000 g | INTRAVENOUS | Status: DC
  Administered 2021-12-18 – 2021-12-19 (×2): 2 g via INTRAVENOUS
  Filled 2021-12-18 (×2): qty 20

## 2021-12-18 MED ORDER — CYANOCOBALAMIN 500 MCG PO TABS
500.0000 ug | ORAL_TABLET | Freq: Every day | ORAL | Status: DC
  Administered 2021-12-19 – 2021-12-20 (×2): 500 ug via ORAL
  Filled 2021-12-18 (×3): qty 1

## 2021-12-18 MED ORDER — PANTOPRAZOLE SODIUM 40 MG PO TBEC: 40.0000 mg | DELAYED_RELEASE_TABLET | Freq: Every day | ORAL | Status: DC | PRN

## 2021-12-18 MED ORDER — MONTELUKAST SODIUM 10 MG PO TABS
10.0000 mg | ORAL_TABLET | Freq: Every day | ORAL | Status: DC
  Administered 2021-12-19 – 2021-12-20 (×2): 10 mg via ORAL
  Filled 2021-12-18 (×3): qty 1

## 2021-12-18 MED ORDER — POLYETHYLENE GLYCOL 3350 17 G PO PACK: 17.0000 g | PACK | Freq: Every day | ORAL | Status: DC | PRN

## 2021-12-18 MED ORDER — OXYCODONE HCL 5 MG PO TABS
5.0000 mg | ORAL_TABLET | Freq: Four times a day (QID) | ORAL | Status: DC | PRN
  Administered 2021-12-18 – 2021-12-19 (×3): 5 mg via ORAL
  Filled 2021-12-18 (×3): qty 1

## 2021-12-18 MED ORDER — DULOXETINE HCL 60 MG PO CPEP
60.0000 mg | ORAL_CAPSULE | Freq: Every day | ORAL | Status: DC
  Administered 2021-12-18 – 2021-12-20 (×3): 60 mg via ORAL
  Filled 2021-12-18: qty 2
  Filled 2021-12-18 (×2): qty 1

## 2021-12-18 MED ORDER — GABAPENTIN 100 MG PO CAPS
100.0000 mg | ORAL_CAPSULE | Freq: Two times a day (BID) | ORAL | Status: DC
  Administered 2021-12-18 – 2021-12-20 (×6): 100 mg via ORAL
  Filled 2021-12-18 (×6): qty 1

## 2021-12-18 MED ORDER — METRONIDAZOLE 500 MG/100ML IV SOLN
500.0000 mg | Freq: Two times a day (BID) | INTRAVENOUS | Status: DC
  Administered 2021-12-18 – 2021-12-19 (×4): 500 mg via INTRAVENOUS
  Filled 2021-12-18 (×4): qty 100

## 2021-12-18 MED ORDER — HYDROMORPHONE HCL 2 MG/ML IJ SOLN: 0.5000 mg | INTRAMUSCULAR | Status: DC | PRN

## 2021-12-18 MED ORDER — SODIUM CHLORIDE 0.9 % IV SOLN
1000.0000 mL | INTRAVENOUS | Status: DC
  Administered 2021-12-18 (×2): 1000 mL via INTRAVENOUS

## 2021-12-18 NOTE — Progress Notes (Signed)
PROGRESS NOTE    Eddie Graham  NOB:[id number] DOB: [birth year] DOA: 12/17/2021 PCP: Kristopher Glee., MD    Brief Narrative:  Eddie Graham is a 87 y.o. male with medical history significant for bladder cancer, urethral stricture status post cystoscopy with ureteral dilatation on 12/09/2021, AAA, type 2 diabetes, diabetic polyneuropathy, hyperlipidemia, who presented to [facility] ED from home with complaints of left lower quadrant abdominal pain and intermittent rectal bleeding of 3 days duration.     Work-up in the ED revealed sigmoid diverticulitis seen on CT abdomen pelvis without contrast.      Assessment and Plan: Sigmoid diverticulitis, POA Started on empiric Rocephin and IV Flagyl in the ED, continue  IVF -advance diet as tolerated Analgesics as needed   Intermittent rectal bleeding, unclear etiology- ? diverticular Home Plavix on hold due to rectal bleeding Monitor H&H -outpatient GI Evaluation   Bladder cancer status post recent cystoscopy with ureteral dilatation on 12/09/2021 No acute issues.   Type 2 diabetes with hyperglycemia Last hemoglobin A1c 14.2 on 12/05/2021-- needs better control Hold off home oral antihyperglycemic -add insulin Start insulin sliding scale.   Diabetic polyneuropathy Resume home regimen   Hyperlipidemia Resume home Crestor   CKD 3B Appears to be close to his baseline creatinine. Avoid nephrotoxic agents, dehydration and hypotension.   Hypovolemic hyponatremia IVF   DVT prophylaxis: SCDs Start: 12/17/21 2136    Code Status: Full Code Family Communication:   Disposition Plan:  Level of care: Med-Surg Status is: Observation The patient will require care spanning > 2 midnights and should be moved to inpatient because: needs IV abx    Consultants:  none   Subjective: Feeling better  Objective: Vitals:   12/18/21 0700 12/18/21 0800 12/18/21 0958 12/18/21 1100  BP: 129/69 113/62  127/71  Pulse: 66 67   71  Resp: '19 15  19  '$ Temp:   97.7 F (36.5 C)   TempSrc:   Oral   SpO2: 90% 98%  97%   No intake or output data in the 24 hours ending 12/18/21 1217 There were no vitals filed for this visit.  Examination:   General: Appearance:     Overweight male in no acute distress     Lungs:     Clear to auscultation bilaterally, respirations unlabored  Heart:    Normal heart rate. Normal rhythm. No murmurs, rubs, or gallops.    MS:   All extremities are intact.    Neurologic:   Awake, alert, oriented x 3. No apparent focal neurological           defect.        Data Reviewed: I have personally reviewed following labs and imaging studies  CBC: Recent Labs  Lab 12/17/21 1526 12/18/21 0320  WBC 9.3 7.6  HGB 13.6 12.2*  HCT 41.3 37.9*  MCV 85.3 86.5  PLT 292 947   Basic Metabolic Panel: Recent Labs  Lab 12/17/21 1526 12/18/21 0320  NA 130* 136  K 4.9 4.2  CL 95* 103  CO2 24 25  GLUCOSE 476* 244*  BUN 36* 33*  CREATININE 1.86* 1.53*  CALCIUM 8.9 8.4*  MG  --  2.4   GFR: Estimated Creatinine Clearance: 36 mL/min (A) (by C-G formula based on SCr of 1.53 mg/dL (H)). Liver Function Tests: Recent Labs  Lab 12/17/21 1526  AST 11*  ALT 17  ALKPHOS 70  BILITOT 0.8  PROT 7.0  ALBUMIN 3.3*   Recent Labs  Lab 12/17/21 1526  LIPASE 36  No results for input(s): "AMMONIA" in the last 168 hours. Coagulation Profile: No results for input(s): "INR", "PROTIME" in the last 168 hours. Cardiac Enzymes: No results for input(s): "CKTOTAL", "CKMB", "CKMBINDEX", "TROPONINI" in the last 168 hours. BNP (last 3 results) No results for input(s): "PROBNP" in the last 8760 hours. HbA1C: No results for input(s): "HGBA1C" in the last 72 hours. CBG: Recent Labs  Lab 12/17/21 1859 12/18/21 0151 12/18/21 0753 12/18/21 1139  GLUCAP 375* 216* 142* 279*   Lipid Profile: No results for input(s): "CHOL", "HDL", "LDLCALC", "TRIG", "CHOLHDL", "LDLDIRECT" in the last 72 hours. Thyroid  Function Tests: No results for input(s): "TSH", "T4TOTAL", "FREET4", "T3FREE", "THYROIDAB" in the last 72 hours. Anemia Panel: No results for input(s): "VITAMINB12", "FOLATE", "FERRITIN", "TIBC", "IRON", "RETICCTPCT" in the last 72 hours. Sepsis Labs: No results for input(s): "PROCALCITON", "LATICACIDVEN" in the last 168 hours.  No results found for this or any previous visit (from the past 240 hour(s)).       Radiology Studies: CT ABDOMEN PELVIS WO CONTRAST  Result Date: 12/17/2021 CLINICAL DATA:  Abdominal pain and rectal bleeding. EXAM: CT ABDOMEN AND PELVIS WITHOUT CONTRAST TECHNIQUE: Multidetector CT imaging of the abdomen and pelvis was performed following the standard protocol without IV contrast. RADIATION DOSE REDUCTION: This exam was performed according to the departmental dose-optimization program which includes automated exposure control, adjustment of the mA and/or kV according to patient size and/or use of iterative reconstruction technique. COMPARISON:  Sep 01, 2021 FINDINGS: Lower chest: No acute abnormality. Hepatobiliary: No focal liver abnormality is seen. No gallstones, gallbladder wall thickening, or biliary dilatation. Pancreas: Unremarkable. No pancreatic ductal dilatation or surrounding inflammatory changes. Spleen: Normal in size without focal abnormality. Adrenals/Urinary Tract: Adrenal glands are unremarkable. Kidneys are normal in size and mildly lobulated in appearance, without renal calculi, focal lesion, or hydronephrosis. Mild urinary bladder wall thickening is seen. Stomach/Bowel: Stomach is within normal limits. Appendix appears normal. No evidence of bowel dilatation. Numerous diverticula are seen throughout the large bowel. Large moderate to markedly inflamed diverticula are noted within the proximal to mid sigmoid colon. There is no evidence of associated perforation or abscess. Vascular/Lymphatic: Aortic atherosclerosis. No enlarged abdominal or pelvic lymph  nodes. Reproductive: Small calcifications are seen within an otherwise normal appearing prostate gland. Other: No abdominal wall hernia or abnormality. No abdominopelvic ascites. Musculoskeletal: Multilevel degenerative changes are seen throughout the lumbar spine. IMPRESSION: 1. Sigmoid diverticulitis. 2. Mild urinary bladder wall thickening, which may represent sequelae associated with cystitis. Correlation with urinalysis is recommended. 3. Aortic atherosclerosis. Aortic Atherosclerosis (ICD10-I70.0). Electronically Signed   By: Virgina Norfolk M.D.   On: 12/17/2021 19:53        Scheduled Meds:  cyanocobalamin  500 mcg Oral Daily   DULoxetine  60 mg Oral Daily   gabapentin  100 mg Oral BID   insulin aspart  0-5 Units Subcutaneous QHS   insulin aspart  0-9 Units Subcutaneous TID WC   insulin detemir  6 Units Subcutaneous Daily   montelukast  10 mg Oral Daily   rosuvastatin  40 mg Oral Daily   Continuous Infusions:  sodium chloride 1,000 mL (12/18/21 0153)   cefTRIAXone (ROCEPHIN)  IV     And   metronidazole 500 mg (12/18/21 1005)     LOS: 0 days    Time spent: 45 min minutes spent on chart review, discussion with nursing staff, consultants, updating family and interview/physical exam; more than 50% of that time was spent in counseling and/or coordination of care.  Geradine Girt, DO [facility] Available via Epic secure chat 7am-7pm After these hours, please refer to coverage provider listed on [email or web address] 12/18/2021, 12:17 PM

## 2021-12-18 NOTE — Inpatient Diabetes Management (Addendum)
Inpatient Diabetes Program Recommendations  AACE/ADA: New Consensus Statement on Inpatient Glycemic Control (2015)  Target Ranges:  Prepandial:   less than 140 mg/dL      Peak postprandial:   less than 180 mg/dL (1-2 hours)      Critically ill patients:  140 - 180 mg/dL   Lab Results  Component Value Date   GLUCAP 142 (H) 12/18/2021   HGBA1C 14.2 (H) 12/05/2021    Review of Glycemic Control  Latest Reference Range & Units 12/17/21 18:59 12/18/21 01:51 12/18/21 07:53  Glucose-Capillary 70 - 99 mg/dL 375 (H) 216 (H) 142 (H)  (H): Data is abnormally high Diabetes history: Type 2 Dm Outpatient Diabetes medications: Rybelsus 14 mg QD, Lantus 15 units QHS, Glipizide 10 mg QD Current orders for Inpatient glycemic control: Novolog 0-9 units TID & HS  Inpatient Diabetes Program Recommendations:    Consider adding Levemir 6 units QD.  Will plan to see.   Spoke with patient using translator. Very difficult for translator to communicate for patient as he became agitated that he did not feel he needed the interpreter, however, could not understand some of the medical questions I was asking.  Patient denies missing doses and reports checking blood sugars multiple times per day (300+ mg/dL). Never experiences lows. Will increase his Lantus and self adjust based on blood sugar readings, but this has not helped.  Reviewed patient's current A1c of 14.2%. Explained what a A1c is and what it measures. Also reviewed goal A1c with patient, importance of good glucose control @ home, and blood sugar goals. Briefly reviewed patho, reasons for polyuria and polydipsia, survival skills, interventions, differences between Lantus and 70/30, vascular changes and commodities.  Patient has meter and necessary supplies. Reviewed when to call MD and he became angry stating, "I am not calling them it is too hard to get an answer on what I should do, I mean what for?" Encouraged to reach out with needs and stressed the  importance of taking medications as prescribed.  Admits to drinking large amounts of juice and encouraged to atleast half his consumption, not happy regarding this however willing to make adjustments.  We discussed Novolog 70/30 in detail to include ensuring meal consumption at the time of administration and would possibly consider as an option for outpatient as short acting insulin seems necessary but MDI is unrealistic.    Thanks, Bronson Curb, MSN, RNC-OB Diabetes Coordinator [phone] (8a-5p)

## 2021-12-19 DIAGNOSIS — K5732 Diverticulitis of large intestine without perforation or abscess without bleeding: Secondary | ICD-10-CM | POA: Diagnosis not present

## 2021-12-19 LAB — CBC
HCT: 39.3 % (ref 39.0–52.0)
Hemoglobin: 12.8 g/dL — ABNORMAL LOW (ref 13.0–17.0)
MCH: 27.8 pg (ref 26.0–34.0)
MCHC: 32.6 g/dL (ref 30.0–36.0)
MCV: 85.4 fL (ref 80.0–100.0)
Platelets: 229 10*3/uL (ref 150–400)
RBC: 4.6 MIL/uL (ref 4.22–5.81)
RDW: 13.2 % (ref 11.5–15.5)
WBC: 7.6 10*3/uL (ref 4.0–10.5)
nRBC: 0 % (ref 0.0–0.2)

## 2021-12-19 LAB — GLUCOSE, CAPILLARY
Glucose-Capillary: 166 mg/dL — ABNORMAL HIGH (ref 70–99)
Glucose-Capillary: 231 mg/dL — ABNORMAL HIGH (ref 70–99)
Glucose-Capillary: 197 mg/dL — ABNORMAL HIGH (ref 70–99)
Glucose-Capillary: 250 mg/dL — ABNORMAL HIGH (ref 70–99)

## 2021-12-19 LAB — BASIC METABOLIC PANEL
Anion gap: 7 (ref 5–15)
BUN: 17 mg/dL (ref 8–23)
CO2: 25 mmol/L (ref 22–32)
Calcium: 8.5 mg/dL — ABNORMAL LOW (ref 8.9–10.3)
Chloride: 107 mmol/L (ref 98–111)
Creatinine, Ser: 1.28 mg/dL — ABNORMAL HIGH (ref 0.61–1.24)
GFR, Estimated: 55 mL/min — ABNORMAL LOW (ref >60)
Glucose, Bld: 134 mg/dL — ABNORMAL HIGH (ref 70–99)
Potassium: 3.9 mmol/L (ref 3.5–5.1)
Sodium: 139 mmol/L (ref 135–145)

## 2021-12-19 MED ORDER — DICLOFENAC SODIUM 1 % EX GEL
2.0000 g | Freq: Four times a day (QID) | CUTANEOUS | Status: DC
  Administered 2021-12-19 – 2021-12-20 (×5): 2 g via TOPICAL
  Filled 2021-12-19: qty 100

## 2021-12-19 MED ORDER — LIDOCAINE 5 % EX PTCH
1.0000 | MEDICATED_PATCH | CUTANEOUS | Status: DC
  Administered 2021-12-19: 1 via TRANSDERMAL
  Filled 2021-12-19 (×2): qty 1

## 2021-12-19 NOTE — Plan of Care (Signed)
  Problem: Education: Goal: Knowledge of General Education information will improve Description: Including pain rating scale, medication(s)/side effects and non-pharmacologic comfort measures Outcome: Progressing   Problem: Coping: Goal: Level of anxiety will decrease Outcome: Progressing   Problem: Pain Managment: Goal: General experience of comfort will improve Outcome: Progressing   

## 2021-12-19 NOTE — Progress Notes (Signed)
Transition of Care [facility]) Screening Note  Patient Details  Name: Eddie Graham Date of Birth: December 15, 1934  Transition of Care [facility]) CM/SW Contact:    Sherie Don, LCSW Phone Number: 12/19/2021, 9:29 AM  Transition of Care Department [facility]) has reviewed patient and no TOC needs have been identified at this time. We will continue to monitor patient advancement through interdisciplinary progression rounds. If new patient transition needs arise, please place a TOC consult.

## 2021-12-19 NOTE — Progress Notes (Signed)
PROGRESS NOTE    Eddie Graham  YYT:[id number] DOB: 07-Aug-1934 DOA: 12/17/2021 PCP: Kristopher Glee., MD    Brief Narrative:  Eddie Graham is a 87 y.o. male with medical history significant for bladder cancer, urethral stricture status post cystoscopy with ureteral dilatation on 12/09/2021, AAA, type 2 diabetes, diabetic polyneuropathy, hyperlipidemia, who presented to [facility] ED from home with complaints of left lower quadrant abdominal pain and intermittent rectal bleeding of 3 days duration.     Work-up in the ED revealed sigmoid diverticulitis seen on CT abdomen pelvis without contrast.  Slowly improving    Assessment and Plan: Sigmoid diverticulitis, POA Started on empiric Rocephin and IV Flagyl in the ED -advance diet as tolerated Analgesics as needed   Intermittent rectal bleeding, unclear etiology- ? diverticular Home Plavix on hold due to rectal bleeding Monitor H&H -outpatient GI Evaluation   Bladder cancer status post recent cystoscopy with ureteral dilatation on 12/09/2021 No acute issues.   Type 2 diabetes with hyperglycemia Last hemoglobin A1c 14.2 on 12/05/2021-- needs better control Hold off home oral antihyperglycemic -add insulin Start insulin sliding scale.   Diabetic polyneuropathy Resume home regimen   Hyperlipidemia Resume home Crestor   CKD 3B Appears to be close to his baseline creatinine. Avoid nephrotoxic agents, dehydration and hypotension.   Hypovolemic hyponatremia -resolved with IVF   DVT prophylaxis: SCDs Start: 12/17/21 2136    Code Status: Full Code Family Communication:   Disposition Plan:  Level of care: Med-Surg Status is: Observation The patient will require care spanning > 2 midnights and should be moved to inpatient because: needs IV abx; home in AM once tolerating PO    Consultants:  none   Subjective: No rectal bleeding, c/o leg pain  Objective: Vitals:   12/18/21 2054 12/19/21 0210 12/19/21  0512 12/19/21 0720  BP: (!) 122/95 (!) 108/58 113/64   Pulse: 69 76 76   Resp: '18 18 18   '$ Temp: 97.8 F (36.6 C) 97.8 F (36.6 C) 97.9 F (36.6 C)   TempSrc:      SpO2: 98% 100% 95%   Height:    '5\' 6"'$  (1.676 m)    Intake/Output Summary (Last 24 hours) at 12/19/2021 1017 Last data filed at 12/19/2021 0841 Gross per 24 hour  Intake 1327.76 ml  Output 1775 ml  Net -447.24 ml   There were no vitals filed for this visit.  Examination:    General: Appearance:     Overweight male in no acute distress     Lungs:     respirations unlabored  Heart:    Normal heart rate. .   MS:   All extremities are intact.   Neurologic:   Awake, alert         Data Reviewed: I have personally reviewed following labs and imaging studies  CBC: Recent Labs  Lab 12/17/21 1526 12/18/21 0320 12/19/21 0316  WBC 9.3 7.6 7.6  HGB 13.6 12.2* 12.8*  HCT 41.3 37.9* 39.3  MCV 85.3 86.5 85.4  PLT 292 244 275   Basic Metabolic Panel: Recent Labs  Lab 12/17/21 1526 12/18/21 0320 12/19/21 0316  NA 130* 136 139  K 4.9 4.2 3.9  CL 95* 103 107  CO2 '24 25 25  '$ GLUCOSE 476* 244* 134*  BUN 36* 33* 17  CREATININE 1.86* 1.53* 1.28*  CALCIUM 8.9 8.4* 8.5*  MG  --  2.4  --    GFR: Estimated Creatinine Clearance: 43.1 mL/min (A) (by C-G formula based on SCr of 1.28  mg/dL (H)). Liver Function Tests: Recent Labs  Lab 12/17/21 1526  AST 11*  ALT 17  ALKPHOS 70  BILITOT 0.8  PROT 7.0  ALBUMIN 3.3*   Recent Labs  Lab 12/17/21 1526  LIPASE 36   No results for input(s): "AMMONIA" in the last 168 hours. Coagulation Profile: No results for input(s): "INR", "PROTIME" in the last 168 hours. Cardiac Enzymes: No results for input(s): "CKTOTAL", "CKMB", "CKMBINDEX", "TROPONINI" in the last 168 hours. BNP (last 3 results) No results for input(s): "PROBNP" in the last 8760 hours. HbA1C: No results for input(s): "HGBA1C" in the last 72 hours. CBG: Recent Labs  Lab 12/18/21 0753 12/18/21 1139  12/18/21 1640 12/18/21 2058 12/19/21 0741  GLUCAP 142* 279* 208* 239* 166*   Lipid Profile: No results for input(s): "CHOL", "HDL", "LDLCALC", "TRIG", "CHOLHDL", "LDLDIRECT" in the last 72 hours. Thyroid Function Tests: No results for input(s): "TSH", "T4TOTAL", "FREET4", "T3FREE", "THYROIDAB" in the last 72 hours. Anemia Panel: No results for input(s): "VITAMINB12", "FOLATE", "FERRITIN", "TIBC", "IRON", "RETICCTPCT" in the last 72 hours. Sepsis Labs: No results for input(s): "PROCALCITON", "LATICACIDVEN" in the last 168 hours.  No results found for this or any previous visit (from the past 240 hour(s)).       Radiology Studies: CT ABDOMEN PELVIS WO CONTRAST  Result Date: 12/17/2021 CLINICAL DATA:  Abdominal pain and rectal bleeding. EXAM: CT ABDOMEN AND PELVIS WITHOUT CONTRAST TECHNIQUE: Multidetector CT imaging of the abdomen and pelvis was performed following the standard protocol without IV contrast. RADIATION DOSE REDUCTION: This exam was performed according to the departmental dose-optimization program which includes automated exposure control, adjustment of the mA and/or kV according to patient size and/or use of iterative reconstruction technique. COMPARISON:  Sep 01, 2021 FINDINGS: Lower chest: No acute abnormality. Hepatobiliary: No focal liver abnormality is seen. No gallstones, gallbladder wall thickening, or biliary dilatation. Pancreas: Unremarkable. No pancreatic ductal dilatation or surrounding inflammatory changes. Spleen: Normal in size without focal abnormality. Adrenals/Urinary Tract: Adrenal glands are unremarkable. Kidneys are normal in size and mildly lobulated in appearance, without renal calculi, focal lesion, or hydronephrosis. Mild urinary bladder wall thickening is seen. Stomach/Bowel: Stomach is within normal limits. Appendix appears normal. No evidence of bowel dilatation. Numerous diverticula are seen throughout the large bowel. Large moderate to markedly  inflamed diverticula are noted within the proximal to mid sigmoid colon. There is no evidence of associated perforation or abscess. Vascular/Lymphatic: Aortic atherosclerosis. No enlarged abdominal or pelvic lymph nodes. Reproductive: Small calcifications are seen within an otherwise normal appearing prostate gland. Other: No abdominal wall hernia or abnormality. No abdominopelvic ascites. Musculoskeletal: Multilevel degenerative changes are seen throughout the lumbar spine. IMPRESSION: 1. Sigmoid diverticulitis. 2. Mild urinary bladder wall thickening, which may represent sequelae associated with cystitis. Correlation with urinalysis is recommended. 3. Aortic atherosclerosis. Aortic Atherosclerosis (ICD10-I70.0). Electronically Signed   By: Virgina Norfolk M.D.   On: 12/17/2021 19:53        Scheduled Meds:  cyanocobalamin  500 mcg Oral Daily   diclofenac Sodium  2 g Topical QID   DULoxetine  60 mg Oral Daily   gabapentin  100 mg Oral BID   insulin aspart  0-5 Units Subcutaneous QHS   insulin aspart  0-9 Units Subcutaneous TID WC   insulin detemir  6 Units Subcutaneous Daily   lidocaine  1 patch Transdermal Q24H   montelukast  10 mg Oral Daily   rosuvastatin  40 mg Oral Daily   Continuous Infusions:  cefTRIAXone (ROCEPHIN)  IV 2  g (12/18/21 2134)   And   metronidazole 500 mg (12/19/21 0945)     LOS: 0 days    Time spent: 45 min minutes spent on chart review, discussion with nursing staff, consultants, updating family and interview/physical exam; more than 50% of that time was spent in counseling and/or coordination of care.    Geradine Girt, DO [facility] Available via Epic secure chat 7am-7pm After these hours, please refer to coverage provider listed on [email or web address] 12/19/2021, 10:17 AM

## 2021-12-19 NOTE — Progress Notes (Deleted)
Aquacel dressing found saturated in blood. Slight bleeding noted at mid knee. RN changed dressing assisted by Vaughan Basta, Agricultural consultant. Ace wrap added for compression. Ice man replaced. Patient is stable and addresses that all acute needs have been met at this time. Patient's son is at bedside. Call bell in place.

## 2021-12-20 DIAGNOSIS — E118 Type 2 diabetes mellitus with unspecified complications: Secondary | ICD-10-CM

## 2021-12-20 DIAGNOSIS — K5732 Diverticulitis of large intestine without perforation or abscess without bleeding: Secondary | ICD-10-CM | POA: Diagnosis not present

## 2021-12-20 LAB — BASIC METABOLIC PANEL
Anion gap: 6 (ref 5–15)
BUN: 20 mg/dL (ref 8–23)
CO2: 26 mmol/L (ref 22–32)
Calcium: 8.3 mg/dL — ABNORMAL LOW (ref 8.9–10.3)
Chloride: 105 mmol/L (ref 98–111)
Creatinine, Ser: 1.38 mg/dL — ABNORMAL HIGH (ref 0.61–1.24)
GFR, Estimated: 50 mL/min — ABNORMAL LOW (ref >60)
Glucose, Bld: 206 mg/dL — ABNORMAL HIGH (ref 70–99)
Potassium: 4 mmol/L (ref 3.5–5.1)
Sodium: 137 mmol/L (ref 135–145)

## 2021-12-20 LAB — GLUCOSE, CAPILLARY
Glucose-Capillary: 221 mg/dL — ABNORMAL HIGH (ref 70–99)
Glucose-Capillary: 340 mg/dL — ABNORMAL HIGH (ref 70–99)

## 2021-12-20 MED ORDER — CLOPIDOGREL BISULFATE 75 MG PO TABS: 75.0000 mg | ORAL_TABLET | Freq: Every day | ORAL | Status: DC

## 2021-12-20 MED ORDER — LIDOCAINE 5 % EX PTCH
2.0000 | MEDICATED_PATCH | Freq: Every day | CUTANEOUS | Status: DC
  Administered 2021-12-20: 2 via TRANSDERMAL
  Filled 2021-12-20 (×2): qty 2

## 2021-12-20 MED ORDER — AMOXICILLIN-POT CLAVULANATE 875-125 MG PO TABS: 1.0000 | ORAL_TABLET | Freq: Two times a day (BID) | ORAL | 0 refills | Status: DC

## 2021-12-20 MED ORDER — AMOXICILLIN-POT CLAVULANATE 875-125 MG PO TABS
1.0000 | ORAL_TABLET | Freq: Two times a day (BID) | ORAL | Status: DC
Start: 2021-12-20 — End: 2021-12-20
  Administered 2021-12-20: 1 via ORAL
  Filled 2021-12-20: qty 1

## 2021-12-20 MED ORDER — FUROSEMIDE 40 MG PO TABS: 40.0000 mg | ORAL_TABLET | Freq: Every day | ORAL | Status: DC

## 2021-12-20 MED ORDER — POLYETHYLENE GLYCOL 3350 17 G PO PACK: 17.0000 g | PACK | Freq: Every day | ORAL | 0 refills | Status: DC | PRN

## 2021-12-20 MED ORDER — DICLOFENAC SODIUM 1 % EX GEL: 2.0000 g | Freq: Four times a day (QID) | CUTANEOUS | 0 refills | Status: DC

## 2021-12-20 NOTE — Discharge Summary (Signed)
Physician Discharge Summary  Eddie Graham HQI:[id number] DOB: 05-17-1934 DOA: 12/17/2021  PCP: Kristopher Glee., MD  Admit date: 12/17/2021 Discharge date: 12/20/2021  Admitted From: home Discharge disposition: home   Recommendations for Outpatient Follow-Up:   Needs more support at home-- home health ordered Referral to Diabetic education placed Outpatient GI follow up  Discharge Diagnosis:   Principal Problem:   Sigmoid diverticulitis Active Problems:   Type 2 diabetes mellitus with complication, without long-term current use of insulin ([place])    Discharge Condition: Improved.  Diet recommendation: Low sodium, heart healthy.  Carbohydrate-modified.   Wound care: None.  Code status: Full.   History of Present Illness:   Eddie Graham is a 87 y.o. male with medical history significant for bladder cancer, urethral stricture status post cystoscopy with ureteral dilatation on 12/09/2021, AAA, type 2 diabetes, diabetic polyneuropathy, hyperlipidemia, who presented to [facility] ED from home with complaints of left lower quadrant abdominal pain and intermittent rectal bleeding of 3 days duration.     Work-up in the ED revealed sigmoid diverticulitis seen on CT abdomen pelvis without contrast.  No evidence of perforation or abscess.  Started on empiric Rocephin and IV Flagyl in the ED.  The patient was admitted by the hospitalist service, [facility].  Lower GI bleed improved at the time of the visit.   ED Course: Tmax 97.5.  BP 123/67, pulse 68, respiration rate 17, O2 saturation 98% on room air.  Lab studies significant for serum sodium 138, glucose 476, BUN 26, creatinine 1.86, GFR 35, troponin 5, repeated 5.  CBC essentially unremarkable.   Hospital Course by Problem:   Sigmoid diverticulitis, POA Started on empiric Rocephin and IV Flagyl in the ED- change to PO abx to finish course -diet advanced   Intermittent rectal bleeding, unclear etiology- ?  diverticular -resolved -outpatient GI Evaluation   Bladder cancer status post recent cystoscopy with ureteral dilatation on 12/09/2021 No acute issues.   Type 2 diabetes with hyperglycemia Last hemoglobin A1c 14.2 on 12/05/2021-- needs better control -resume home meds -referral to DM education    Diabetic polyneuropathy Resume home regimen   Hyperlipidemia Resume home Crestor   CKD 3B Appears to be close to his baseline creatinine. Avoid nephrotoxic agents, dehydration and hypotension.   Hypovolemic hyponatremia -resolved with IVF    Medical Consultants:   none   Discharge Exam:   Vitals:   12/20/21 0614 12/20/21 1028  BP: 123/65 (!) 132/91  Pulse: 75 80  Resp: 18 17  Temp: (!) 97.5 F (36.4 C) 97.9 F (36.6 C)  SpO2: 94% 96%   Vitals:   12/19/21 1429 12/19/21 2111 12/20/21 0614 12/20/21 1028  BP: 123/80 (!) 151/73 123/65 (!) 132/91  Pulse: 81 75 75 80  Resp:  '18 18 17  '$ Temp: 97.9 F (36.6 C) 98.7 F (37.1 C) (!) 97.5 F (36.4 C) 97.9 F (36.6 C)  TempSrc: Oral Oral Oral Oral  SpO2: 98% 96% 94% 96%  Weight:      Height:        General exam: Appears calm and comfortable.    The results of significant diagnostics from this hospitalization (including imaging, microbiology, ancillary and laboratory) are listed below for reference.     Procedures and Diagnostic Studies:   CT ABDOMEN PELVIS WO CONTRAST  Result Date: 12/17/2021 CLINICAL DATA:  Abdominal pain and rectal bleeding. EXAM: CT ABDOMEN AND PELVIS WITHOUT CONTRAST TECHNIQUE: Multidetector CT imaging of the abdomen and pelvis was performed following the  standard protocol without IV contrast. RADIATION DOSE REDUCTION: This exam was performed according to the departmental dose-optimization program which includes automated exposure control, adjustment of the mA and/or kV according to patient size and/or use of iterative reconstruction technique. COMPARISON:  Sep 01, 2021 FINDINGS: Lower chest: No acute  abnormality. Hepatobiliary: No focal liver abnormality is seen. No gallstones, gallbladder wall thickening, or biliary dilatation. Pancreas: Unremarkable. No pancreatic ductal dilatation or surrounding inflammatory changes. Spleen: Normal in size without focal abnormality. Adrenals/Urinary Tract: Adrenal glands are unremarkable. Kidneys are normal in size and mildly lobulated in appearance, without renal calculi, focal lesion, or hydronephrosis. Mild urinary bladder wall thickening is seen. Stomach/Bowel: Stomach is within normal limits. Appendix appears normal. No evidence of bowel dilatation. Numerous diverticula are seen throughout the large bowel. Large moderate to markedly inflamed diverticula are noted within the proximal to mid sigmoid colon. There is no evidence of associated perforation or abscess. Vascular/Lymphatic: Aortic atherosclerosis. No enlarged abdominal or pelvic lymph nodes. Reproductive: Small calcifications are seen within an otherwise normal appearing prostate gland. Other: No abdominal wall hernia or abnormality. No abdominopelvic ascites. Musculoskeletal: Multilevel degenerative changes are seen throughout the lumbar spine. IMPRESSION: 1. Sigmoid diverticulitis. 2. Mild urinary bladder wall thickening, which may represent sequelae associated with cystitis. Correlation with urinalysis is recommended. 3. Aortic atherosclerosis. Aortic Atherosclerosis (ICD10-I70.0). Electronically Signed   By: Virgina Norfolk M.D.   On: 12/17/2021 19:53     Labs:   Basic Metabolic Panel: Recent Labs  Lab 12/17/21 1526 12/18/21 0320 12/19/21 0316 12/20/21 0421  NA 130* 136 139 137  K 4.9 4.2 3.9 4.0  CL 95* 103 107 105  CO2 '24 25 25 26  '$ GLUCOSE 476* 244* 134* 206*  BUN 36* 33* 17 20  CREATININE 1.86* 1.53* 1.28* 1.38*  CALCIUM 8.9 8.4* 8.5* 8.3*  MG  --  2.4  --   --    GFR Estimated Creatinine Clearance: 40.1 mL/min (A) (by C-G formula based on SCr of 1.38 mg/dL (H)). Liver Function  Tests: Recent Labs  Lab 12/17/21 1526  AST 11*  ALT 17  ALKPHOS 70  BILITOT 0.8  PROT 7.0  ALBUMIN 3.3*   Recent Labs  Lab 12/17/21 1526  LIPASE 36   No results for input(s): "AMMONIA" in the last 168 hours. Coagulation profile No results for input(s): "INR", "PROTIME" in the last 168 hours.  CBC: Recent Labs  Lab 12/17/21 1526 12/18/21 0320 12/19/21 0316  WBC 9.3 7.6 7.6  HGB 13.6 12.2* 12.8*  HCT 41.3 37.9* 39.3  MCV 85.3 86.5 85.4  PLT 292 244 229   Cardiac Enzymes: No results for input(s): "CKTOTAL", "CKMB", "CKMBINDEX", "TROPONINI" in the last 168 hours. BNP: Invalid input(s): "POCBNP" CBG: Recent Labs  Lab 12/19/21 1123 12/19/21 1630 12/19/21 2116 12/20/21 0818 12/20/21 1205  GLUCAP 231* 197* 250* 221* 340*   D-Dimer No results for input(s): "DDIMER" in the last 72 hours. Hgb A1c No results for input(s): "HGBA1C" in the last 72 hours. Lipid Profile No results for input(s): "CHOL", "HDL", "LDLCALC", "TRIG", "CHOLHDL", "LDLDIRECT" in the last 72 hours. Thyroid function studies No results for input(s): "TSH", "T4TOTAL", "T3FREE", "THYROIDAB" in the last 72 hours.  Invalid input(s): "FREET3" Anemia work up No results for input(s): "VITAMINB12", "FOLATE", "FERRITIN", "TIBC", "IRON", "RETICCTPCT" in the last 72 hours. Microbiology No results found for this or any previous visit (from the past 240 hour(s)).   Discharge Instructions:   Discharge Instructions     Ambulatory referral to Nutrition and  Diabetic Education   Complete by: As directed    Diet - low sodium heart healthy   Complete by: As directed    Diet Carb Modified   Complete by: As directed    Increase activity slowly   Complete by: As directed       Allergies as of 12/20/2021   No Known Allergies      Medication List     STOP taking these medications    HYDROcodone-acetaminophen 5-325 MG tablet Commonly known as: NORCO/VICODIN   OVER THE COUNTER MEDICATION    valsartan 80 MG tablet Commonly known as: DIOVAN       TAKE these medications    acetaminophen 500 MG tablet Commonly known as: TYLENOL Take 1,000 mg by mouth 2 (two) times daily as needed (pain).   albuterol 108 (90 Base) MCG/ACT inhaler Commonly known as: VENTOLIN HFA Inhale 2 puffs into the lungs every 4 (four) hours as needed for wheezing.   amoxicillin-clavulanate 875-125 MG tablet Commonly known as: AUGMENTIN Take 1 tablet by mouth every 12 (twelve) hours.   B-12 PO Take 1 capsule by mouth daily.   clopidogrel 75 MG tablet Commonly known as: PLAVIX Take 1 tablet (75 mg total) by mouth daily. Start taking on: December 23, 2021 What changed: These instructions start on December 23, 2021. If you are unsure what to do until then, ask your doctor or other care provider.   diclofenac Sodium 1 % Gel Commonly known as: VOLTAREN Apply 2 g topically 4 (four) times daily.   DULoxetine 60 MG capsule Commonly known as: CYMBALTA Take 60 mg by mouth daily.   EYE DROPS OP Place 2 drops into both eyes as needed (dryness/itching).   furosemide 40 MG tablet Commonly known as: LASIX Take 1 tablet (40 mg total) by mouth daily.   gabapentin 100 MG capsule Commonly known as: NEURONTIN Take 100 mg by mouth 2 (two) times daily.   glipiZIDE 10 MG tablet Commonly known as: GLUCOTROL Take 10 mg by mouth 2 (two) times daily before a meal.   Lantus SoloStar 100 UNIT/ML Solostar Pen Generic drug: insulin glargine Inject 15 Units into the skin at bedtime.   lidocaine 5 % Commonly known as: Lidoderm Place 1 patch onto the skin daily. Remove & Discard patch within 12 hours or as directed by MD   metoprolol succinate 25 MG 24 hr tablet Commonly known as: TOPROL-XL Take 25 mg by mouth daily.   montelukast 10 MG tablet Commonly known as: SINGULAIR Take 10 mg by mouth daily.   pantoprazole 40 MG tablet Commonly known as: PROTONIX Take 40 mg by mouth daily as needed (heartburn).    polyethylene glycol 17 g packet Commonly known as: MIRALAX / GLYCOLAX Take 17 g by mouth daily as needed for mild constipation.   rosuvastatin 40 MG tablet Commonly known as: CRESTOR Take 40 mg by mouth daily.   Semaglutide 14 MG Tabs Take 14 mg by mouth daily.   tamsulosin 0.4 MG Caps capsule Commonly known as: FLOMAX Take 0.4 mg by mouth daily.   traZODone 50 MG tablet Commonly known as: DESYREL Take 100 mg by mouth daily.          Time coordinating discharge: 45 min  Signed:  Geradine Girt DO  [facility] 12/20/2021, 3:48 PM

## 2021-12-20 NOTE — Plan of Care (Signed)
  Problem: Education: Goal: Knowledge of General Education information will improve Description: Including pain rating scale, medication(s)/side effects and non-pharmacologic comfort measures Outcome: Progressing   Problem: Activity: Goal: Risk for activity intolerance will decrease Outcome: Progressing   Problem: Pain Managment: Goal: General experience of comfort will improve Outcome: Progressing   

## 2021-12-20 NOTE — Progress Notes (Signed)
Pt alert and oriented. No questions regarding discharge instructions. Belongings sent home with pt.

## 2021-12-20 NOTE — TOC Transition Note (Signed)
Transition of Care [facility]) - CM/SW Discharge Note   Patient Details  Name: Eddie Graham MRN: [id number] Date of Birth: 07-27-1934  [facility] of [facility]) CM/SW Contact:  Ross Ludwig, LCSW Phone Number: 12/20/2021, 11:51 AM   Clinical Narrative:     CSW reviewed patient's information, he was open to [facility] for [place] in March. CSW spoke to [place] and she will accept patient again.  Patient will be going home with home health through [facility].  CSW signing off please reconsult with any other social work needs, home health agency has been notified of planned discharge.     Final next level of care: [place] Barriers to Discharge: Barriers Resolved   Patient Goals and CMS Choice Patient states their goals for this hospitalization and ongoing recovery are:: To return home with home health. CMS Medicare.gov Compare Post Acute Care list provided to:: Patient Choice offered to / list presented to : Patient  Discharge Placement                       Discharge Plan and Services                          HH Arranged: RN, PT, Social Work [facility] Agency: [facility] Date [place]: 12/20/21 Time [place]: [id number] Representative spoke with at [place]: [place] Determinants of Health ([place]) Interventions     Readmission Risk Interventions     No data to display

## 2022-01-24 ENCOUNTER — Emergency Department (HOSPITAL_COMMUNITY)
Admission: EM | Admit: 2022-01-24 | Discharge: 2022-01-24 | Disposition: A | Discharge status: Home / Self Care | Payer: Medicare Other | Attending: Emergency Medicine | Admitting: Emergency Medicine

## 2022-01-24 ENCOUNTER — Other Ambulatory Visit: Payer: Self-pay

## 2022-01-24 ENCOUNTER — Encounter (HOSPITAL_COMMUNITY): Payer: Self-pay

## 2022-01-24 ENCOUNTER — Emergency Department (HOSPITAL_COMMUNITY): Payer: Medicare Other

## 2022-01-24 DIAGNOSIS — C679 Malignant neoplasm of bladder, unspecified: Secondary | ICD-10-CM | POA: Diagnosis not present

## 2022-01-24 DIAGNOSIS — N3289 Other specified disorders of bladder: Secondary | ICD-10-CM

## 2022-01-24 DIAGNOSIS — Z8546 Personal history of malignant neoplasm of prostate: Secondary | ICD-10-CM | POA: Insufficient documentation

## 2022-01-24 DIAGNOSIS — N3090 Cystitis, unspecified without hematuria: Secondary | ICD-10-CM

## 2022-01-24 DIAGNOSIS — E119 Type 2 diabetes mellitus without complications: Secondary | ICD-10-CM | POA: Diagnosis not present

## 2022-01-24 DIAGNOSIS — N3091 Cystitis, unspecified with hematuria: Secondary | ICD-10-CM | POA: Insufficient documentation

## 2022-01-24 DIAGNOSIS — R319 Hematuria, unspecified: Secondary | ICD-10-CM | POA: Diagnosis present

## 2022-01-24 LAB — CBC WITH DIFFERENTIAL/PLATELET
Abs Immature Granulocytes: 0.04 10*3/uL (ref 0.00–0.07)
Basophils Absolute: 0 10*3/uL (ref 0.0–0.1)
Basophils Relative: 0 %
Eosinophils Absolute: 0.2 10*3/uL (ref 0.0–0.5)
Eosinophils Relative: 2 %
HCT: 41.7 % (ref 39.0–52.0)
Hemoglobin: 13.1 g/dL (ref 13.0–17.0)
Immature Granulocytes: 1 %
Lymphocytes Relative: 21 %
Lymphs Abs: 1.8 10*3/uL (ref 0.7–4.0)
MCH: 27.2 pg (ref 26.0–34.0)
MCHC: 31.4 g/dL (ref 30.0–36.0)
MCV: 86.7 fL (ref 80.0–100.0)
Monocytes Absolute: 0.6 10*3/uL (ref 0.1–1.0)
Monocytes Relative: 7 %
Neutro Abs: 5.6 10*3/uL (ref 1.7–7.7)
Neutrophils Relative %: 69 %
Platelets: 287 10*3/uL (ref 150–400)
RBC: 4.81 MIL/uL (ref 4.22–5.81)
RDW: 14 % (ref 11.5–15.5)
WBC: 8.2 10*3/uL (ref 4.0–10.5)
nRBC: 0 % (ref 0.0–0.2)

## 2022-01-24 LAB — URINALYSIS, ROUTINE W REFLEX MICROSCOPIC
Bilirubin Urine: NEGATIVE
Glucose, UA: NEGATIVE mg/dL
Ketones, ur: NEGATIVE mg/dL
Leukocytes,Ua: NEGATIVE
Nitrite: NEGATIVE
Protein, ur: 30 mg/dL — AB
RBC / HPF: >50 RBC/hpf — ABNORMAL HIGH (ref 0–5)
Specific Gravity, Urine: 1.012 (ref 1.005–1.030)
pH: 5 (ref 5.0–8.0)

## 2022-01-24 LAB — COMPREHENSIVE METABOLIC PANEL
ALT: 13 U/L (ref 0–44)
AST: 13 U/L — ABNORMAL LOW (ref 15–41)
Albumin: 3.2 g/dL — ABNORMAL LOW (ref 3.5–5.0)
Alkaline Phosphatase: 62 U/L (ref 38–126)
Anion gap: 7 (ref 5–15)
BUN: 26 mg/dL — ABNORMAL HIGH (ref 8–23)
CO2: 30 mmol/L (ref 22–32)
Calcium: 8.9 mg/dL (ref 8.9–10.3)
Chloride: 104 mmol/L (ref 98–111)
Creatinine, Ser: 1.33 mg/dL — ABNORMAL HIGH (ref 0.61–1.24)
GFR, Estimated: 52 mL/min — ABNORMAL LOW (ref >60)
Glucose, Bld: 106 mg/dL — ABNORMAL HIGH (ref 70–99)
Potassium: 4 mmol/L (ref 3.5–5.1)
Sodium: 141 mmol/L (ref 135–145)
Total Bilirubin: 0.8 mg/dL (ref 0.3–1.2)
Total Protein: 7.2 g/dL (ref 6.5–8.1)

## 2022-01-24 MED ORDER — IOHEXOL 300 MG/ML  SOLN
100.0000 mL | Freq: Once | INTRAMUSCULAR | Status: AC | PRN
  Administered 2022-01-24: 100 mL via INTRAVENOUS

## 2022-01-24 MED ORDER — CEPHALEXIN 500 MG PO CAPS: 500.0000 mg | ORAL_CAPSULE | Freq: Two times a day (BID) | ORAL | 0 refills | Status: DC

## 2022-01-24 MED ORDER — CEPHALEXIN 500 MG PO CAPS
500.0000 mg | ORAL_CAPSULE | Freq: Once | ORAL | Status: AC
  Administered 2022-01-24: 500 mg via ORAL
  Filled 2022-01-24: qty 1

## 2022-01-24 MED ORDER — CEPHALEXIN 500 MG PO CAPS: 500.0000 mg | ORAL_CAPSULE | Freq: Two times a day (BID) | ORAL | 0 refills | Status: AC

## 2022-01-24 NOTE — ED Provider Triage Note (Signed)
Emergency Medicine Provider Triage Evaluation Note  Carmen Vallecillo , a 87 y.o. male  was evaluated in triage.  Pt complains of dysuria and hematuria since last night. No problems with stream or feeling like he isnt emptying bladder. Denies abdominal pain, flank pain, fevers, or chills. He does have hx of bladder cancer  Review of Systems  Positive:  Negative:   Physical Exam  BP (!) 118/59 (BP Location: Left Arm)   Pulse 73   Temp 97.9 F (36.6 C) (Oral)   Resp 16   Ht '5\' 6"'$  (1.676 m)   Wt 84.8 kg   SpO2 96%   BMI 30.18 kg/m  Gen:   Awake, no distress   Resp:  Normal effort  MSK:   Moves extremities without difficulty Other:    Medical Decision Making  Medically screening exam initiated at 3:18 PM.  Appropriate orders placed.  Ahmeer Sanchez-Feliciano was informed that the remainder of the evaluation will be completed by another provider, this initial triage assessment does not replace that evaluation, and the importance of remaining in the ED until their evaluation is complete.     Adolphus Birchwood, [place] 01/24/22 1519

## 2022-01-24 NOTE — ED Triage Notes (Signed)
Per EMS- Patient  c/o hematuria and dysuria since last night. Patient has a history of bladder cancer and prostate issues.

## 2022-01-24 NOTE — ED Provider Notes (Signed)
[facility] DEPT Provider Note   CSN: [id number] Arrival date & time: 01/24/22  1432     History Chief Complaint  Patient presents with   Dysuria   Hematuria   Telehealth interpreter was used for all of the below history of present illness, physical exam findings, medical decision making and disposition discussion.   HPI Eddie Graham is a 87 y.o. male presenting for dysuria.  Patient is an otherwise healthy 87 year old male who endorses 3 days of pain on urination as well as discoloration to his urine.  He denies fevers or chills, nausea vomiting, syncope shortness of breath.  He endorses a history of diabetes, AAA, diverticulitis, urethral stricture and bladder cancer in the past.  He endorses progressive symptoms over the past week.  No known sick contacts.. Patient's recorded medical, surgical, social, medication list and allergies were reviewed in the Snapshot window as part of the initial history.   Review of Systems   Review of Systems  Constitutional:  Negative for chills and fever.  HENT:  Negative for ear pain and sore throat.   Eyes:  Negative for pain and visual disturbance.  Respiratory:  Negative for cough and shortness of breath.   Cardiovascular:  Negative for chest pain and palpitations.  Gastrointestinal:  Negative for abdominal pain and vomiting.  Genitourinary:  Positive for dysuria. Negative for hematuria.  Musculoskeletal:  Negative for arthralgias and back pain.  Skin:  Negative for color change and rash.  Neurological:  Negative for seizures and syncope.  All other systems reviewed and are negative.   Physical Exam Updated Vital Signs BP 135/77   Pulse 70   Temp 98.2 F (36.8 C) (Oral)   Resp 18   Ht '5\' 6"'$  (1.676 m)   Wt 84.8 kg   SpO2 97%   BMI 30.18 kg/m  Physical Exam Vitals and nursing note reviewed.  Constitutional:      General: He is not in acute distress.    Appearance: He is well-developed.   HENT:     Head: Normocephalic and atraumatic.  Eyes:     Conjunctiva/sclera: Conjunctivae normal.  Cardiovascular:     Rate and Rhythm: Normal rate and regular rhythm.     Heart sounds: No murmur heard. Pulmonary:     Effort: Pulmonary effort is normal. No respiratory distress.     Breath sounds: Normal breath sounds.  Abdominal:     Palpations: Abdomen is soft.     Tenderness: There is no abdominal tenderness.  Musculoskeletal:        General: No swelling.     Cervical back: Neck supple.  Skin:    General: Skin is warm and dry.     Capillary Refill: Capillary refill takes less than 2 seconds.  Neurological:     Mental Status: He is alert.  Psychiatric:        Mood and Affect: Mood normal.      ED Course/ Medical Decision Making/ A&P    Procedures Procedures   Medications Ordered in ED Medications  iohexol (OMNIPAQUE) 300 MG/ML solution 100 mL (100 mLs Intravenous Contrast Given 01/24/22 1700)  cephALEXin (KEFLEX) capsule 500 mg (500 mg Oral Given 01/24/22 1813)    Medical Decision Making:    Eddie Graham is a 87 y.o. male who presented to the ED today with abdominal pain and dysuria detailed above.     Additional history discussed with patient's family/caregivers.  Patient's presentation is complicated by their history of multiple comorbid medical problems.  Complete initial physical exam performed, notably the patient  was HDS in NAD.      Reviewed and confirmed nursing documentation for past medical history, family history, social history.    Initial Assessment:   With the patient's presentation of dysuria and discoloration of urine, most likely diagnosis is urinary tract infection. Other diagnoses were considered including (but not limited to) pyelonephritis, nephrolithiasis, appendicitis, cholecystitis, progression of known AAA. These are considered less likely due to history of present illness and physical exam findings.   This is most  consistent with an acute life/limb threatening illness complicated by underlying chronic conditions.  Initial Plan:  We will proceed with CT abdomen pelvis with contrast to evaluate for intra-abdominal pathology Screening labs including CBC and Metabolic panel to evaluate for infectious or metabolic etiology of disease.  Urinalysis with reflex culture ordered to evaluate for UTI or relevant urologic/nephrologic pathology.  Objective evaluation as below reviewed with plan for close reassessment  Initial Study Results:   Laboratory  All laboratory results reviewed without evidence of clinically relevant pathology.   Exceptions include: Hematuria, bacteriuria  Radiology  All images reviewed independently. Agree with radiology report at this time.   CT ABDOMEN PELVIS W CONTRAST  Result Date: 01/24/2022 CLINICAL DATA:  Abdominal pain, hematuria, dysuria EXAM: CT ABDOMEN AND PELVIS WITH CONTRAST TECHNIQUE: Multidetector CT imaging of the abdomen and pelvis was performed using the standard protocol following bolus administration of intravenous contrast. RADIATION DOSE REDUCTION: This exam was performed according to the departmental dose-optimization program which includes automated exposure control, adjustment of the mA and/or kV according to patient size and/or use of iterative reconstruction technique. CONTRAST:  179m OMNIPAQUE IOHEXOL 300 MG/ML  SOLN COMPARISON:  12/17/2021 FINDINGS: Lower chest: No acute abnormality. Hepatobiliary: No solid liver abnormality is seen. No gallstones, gallbladder wall thickening, or biliary dilatation. Pancreas: Unremarkable. No pancreatic ductal dilatation or surrounding inflammatory changes. Spleen: Normal in size without significant abnormality. Adrenals/Urinary Tract: Adrenal glands are unremarkable. Kidneys are normal, without renal calculi, solid lesion, or hydronephrosis. Eccentric urinary bladder wall thickening of the anterior inferior right aspect of the  urinary bladder, measuring up to 1.0 cm in thickness (series 2, image 69, series 4, image 78). Stomach/Bowel: Stomach is within normal limits. Appendix appears normal. No evidence of bowel wall thickening, distention, or inflammatory changes. Severe pancolonic diverticulosis. Vascular/Lymphatic: Aortic atherosclerosis. Right eccentric saccular aneurysm and/or penetrating ulceration of the infrarenal abdominal aorta measuring up to 3.2 x 2.4 cm (series 4, image 79, series 2, image 41). No enlarged abdominal or pelvic lymph nodes. Reproductive: No mass or other significant abnormality. Probable TURP defect of the prostate. Other: Small, fat containing right inguinal hernia.  No ascites. Musculoskeletal: No acute or significant osseous findings. IMPRESSION: 1. Eccentric urinary bladder wall thickening of the anterior inferior right aspect of the urinary bladder, measuring up to 1.0 cm in thickness. This may reflect nonspecific infectious or inflammatory cystitis relatively eccentric, focal appearance is somewhat concerning for bladder malignancy. Consider cytology and cystoscopy 2. Severe pancolonic diverticulosis without evidence of acute diverticulitis. 3. Unchanged right eccentric saccular aneurysm and/or penetrating ulceration of the infrarenal abdominal aorta measuring up to 3.2 x 2.4 cm. Recommend referral to a vascular specialist if not already obtained and clinically appropriate. This recommendation follows ACR consensus guidelines: White Paper of the ACR Incidental Findings Committee II on Vascular Findings. J Am Coll Radiol 2013; 10:789-794. Aortic Atherosclerosis (ICD10-I70.0). Electronically Signed   By: ADelanna AhmadiM.D.   On: 01/24/2022 17:29  Final Assessment and Plan:   On assessment, patient is in no acute distress.  Overall, he is requesting food and is has tolerating p.o. intake.  Given his symptoms of dysuria and presence of bacteria, this might reflect a developing hemorrhagic cystitis based  on Noncon CT scan.  However I am also concerned for recurrence of his bladder cancer.  Called his daughter to reinforce the importance of arranging urologic outpatient follow-up.  I also messaged his urologist since patient has a language barrier.  In the interim, I will treat patient with Keflex for suspected urinary tract infection and recommend close reassessment by primary care provider as well.  Patient currently stable for outpatient care and management discharged with no further acute events.    Clinical Impression:  1. Cystitis   2. Bladder mass      Discharge   Final Clinical Impression(s) / ED Diagnoses Final diagnoses:  Cystitis  Bladder mass    Rx / DC Orders ED Discharge Orders          Ordered    cephALEXin (KEFLEX) 500 MG capsule  2 times daily        01/24/22 1807              Tretha Sciara, MD 01/24/22 1817

## 2022-01-24 NOTE — Discharge Instructions (Addendum)
You were seen this weekend for painful urination with bloody discoloration.  I believe that you are having a acute urinary tract infection and will prescribe you antibiotics to use for the next 5 days.  However your CT scan has a nonspecific mass on it which may be concerning for recurrence of your cancer given your history of similar.  I have informed your daughter and your urologist of this finding but it is critical that either you or your daughter call to arrange an appointment with urology for sometime in the next week as this needs to be evaluated further. Thank you for the opportunity participate in your care, Tretha Sciara MD

## 2022-01-26 LAB — URINE CULTURE: Culture: <10000 — AB

## 2022-03-05 ENCOUNTER — Ambulatory Visit: Payer: Medicare Other | Admitting: Dietician

## 2022-03-10 ENCOUNTER — Inpatient Hospital Stay (HOSPITAL_COMMUNITY)
Admission: EM | Admit: 2022-03-10 | Discharge: 2022-03-12 | DRG: 392 | Disposition: A | Discharge status: Home / Self Care | Payer: Medicare Other | Attending: Internal Medicine | Admitting: Internal Medicine

## 2022-03-10 ENCOUNTER — Other Ambulatory Visit: Payer: Self-pay

## 2022-03-10 ENCOUNTER — Emergency Department (HOSPITAL_COMMUNITY): Payer: Medicare Other

## 2022-03-10 DIAGNOSIS — R1084 Generalized abdominal pain: Secondary | ICD-10-CM | POA: Diagnosis present

## 2022-03-10 DIAGNOSIS — K5720 Diverticulitis of large intestine with perforation and abscess without bleeding: Secondary | ICD-10-CM | POA: Diagnosis present

## 2022-03-10 DIAGNOSIS — Z603 Acculturation difficulty: Secondary | ICD-10-CM | POA: Diagnosis present

## 2022-03-10 DIAGNOSIS — J4489 Other specified chronic obstructive pulmonary disease: Secondary | ICD-10-CM | POA: Diagnosis present

## 2022-03-10 DIAGNOSIS — Z79899 Other long term (current) drug therapy: Secondary | ICD-10-CM | POA: Diagnosis not present

## 2022-03-10 DIAGNOSIS — Z7902 Long term (current) use of antithrombotics/antiplatelets: Secondary | ICD-10-CM | POA: Diagnosis not present

## 2022-03-10 DIAGNOSIS — K219 Gastro-esophageal reflux disease without esophagitis: Secondary | ICD-10-CM | POA: Diagnosis present

## 2022-03-10 DIAGNOSIS — Z833 Family history of diabetes mellitus: Secondary | ICD-10-CM

## 2022-03-10 DIAGNOSIS — Z7984 Long term (current) use of oral hypoglycemic drugs: Secondary | ICD-10-CM

## 2022-03-10 DIAGNOSIS — Z8249 Family history of ischemic heart disease and other diseases of the circulatory system: Secondary | ICD-10-CM | POA: Diagnosis not present

## 2022-03-10 DIAGNOSIS — N179 Acute kidney failure, unspecified: Secondary | ICD-10-CM | POA: Diagnosis present

## 2022-03-10 DIAGNOSIS — I714 Abdominal aortic aneurysm, without rupture, unspecified: Secondary | ICD-10-CM | POA: Diagnosis present

## 2022-03-10 DIAGNOSIS — E1142 Type 2 diabetes mellitus with diabetic polyneuropathy: Secondary | ICD-10-CM | POA: Diagnosis present

## 2022-03-10 DIAGNOSIS — E785 Hyperlipidemia, unspecified: Secondary | ICD-10-CM | POA: Diagnosis present

## 2022-03-10 DIAGNOSIS — I2510 Atherosclerotic heart disease of native coronary artery without angina pectoris: Secondary | ICD-10-CM | POA: Diagnosis present

## 2022-03-10 DIAGNOSIS — G4700 Insomnia, unspecified: Secondary | ICD-10-CM | POA: Diagnosis present

## 2022-03-10 DIAGNOSIS — Z7985 Long-term (current) use of injectable non-insulin antidiabetic drugs: Secondary | ICD-10-CM | POA: Diagnosis not present

## 2022-03-10 DIAGNOSIS — N1832 Chronic kidney disease, stage 3b: Secondary | ICD-10-CM | POA: Diagnosis present

## 2022-03-10 DIAGNOSIS — E1122 Type 2 diabetes mellitus with diabetic chronic kidney disease: Secondary | ICD-10-CM | POA: Diagnosis present

## 2022-03-10 DIAGNOSIS — I129 Hypertensive chronic kidney disease with stage 1 through stage 4 chronic kidney disease, or unspecified chronic kidney disease: Secondary | ICD-10-CM | POA: Diagnosis present

## 2022-03-10 DIAGNOSIS — M1990 Unspecified osteoarthritis, unspecified site: Secondary | ICD-10-CM | POA: Diagnosis present

## 2022-03-10 DIAGNOSIS — Z8551 Personal history of malignant neoplasm of bladder: Secondary | ICD-10-CM

## 2022-03-10 LAB — CBC WITH DIFFERENTIAL/PLATELET
Abs Immature Granulocytes: 0.05 10*3/uL (ref 0.00–0.07)
Basophils Absolute: 0 10*3/uL (ref 0.0–0.1)
Basophils Relative: 0 %
Eosinophils Absolute: 0.3 10*3/uL (ref 0.0–0.5)
Eosinophils Relative: 3 %
HCT: 40.8 % (ref 39.0–52.0)
Hemoglobin: 12.7 g/dL — ABNORMAL LOW (ref 13.0–17.0)
Immature Granulocytes: 1 %
Lymphocytes Relative: 15 %
Lymphs Abs: 1.5 10*3/uL (ref 0.7–4.0)
MCH: 26.2 pg (ref 26.0–34.0)
MCHC: 31.1 g/dL (ref 30.0–36.0)
MCV: 84.3 fL (ref 80.0–100.0)
Monocytes Absolute: 0.6 10*3/uL (ref 0.1–1.0)
Monocytes Relative: 6 %
Neutro Abs: 7.2 10*3/uL (ref 1.7–7.7)
Neutrophils Relative %: 75 %
Platelets: 283 10*3/uL (ref 150–400)
RBC: 4.84 MIL/uL (ref 4.22–5.81)
RDW: 15.5 % (ref 11.5–15.5)
WBC: 9.7 10*3/uL (ref 4.0–10.5)
nRBC: 0 % (ref 0.0–0.2)

## 2022-03-10 LAB — COMPREHENSIVE METABOLIC PANEL
ALT: 15 U/L (ref 0–44)
AST: 15 U/L (ref 15–41)
Albumin: 3.3 g/dL — ABNORMAL LOW (ref 3.5–5.0)
Alkaline Phosphatase: 75 U/L (ref 38–126)
Anion gap: 14 (ref 5–15)
BUN: 34 mg/dL — ABNORMAL HIGH (ref 8–23)
CO2: 20 mmol/L — ABNORMAL LOW (ref 22–32)
Calcium: 9.1 mg/dL (ref 8.9–10.3)
Chloride: 104 mmol/L (ref 98–111)
Creatinine, Ser: 1.5 mg/dL — ABNORMAL HIGH (ref 0.61–1.24)
GFR, Estimated: 45 mL/min — ABNORMAL LOW (ref >60)
Glucose, Bld: 178 mg/dL — ABNORMAL HIGH (ref 70–99)
Potassium: 3.9 mmol/L (ref 3.5–5.1)
Sodium: 138 mmol/L (ref 135–145)
Total Bilirubin: 0.8 mg/dL (ref 0.3–1.2)
Total Protein: 7.1 g/dL (ref 6.5–8.1)

## 2022-03-10 LAB — TROPONIN I (HIGH SENSITIVITY)
Troponin I (High Sensitivity): 10 ng/L (ref <18)
Troponin I (High Sensitivity): 9 ng/L (ref <18)

## 2022-03-10 LAB — CBG MONITORING, ED: Glucose-Capillary: 140 mg/dL — ABNORMAL HIGH (ref 70–99)

## 2022-03-10 LAB — LIPASE, BLOOD: Lipase: 36 U/L (ref 11–51)

## 2022-03-10 MED ORDER — PIOGLITAZONE HCL 30 MG PO TABS
30.0000 mg | ORAL_TABLET | Freq: Every day | ORAL | Status: DC
  Administered 2022-03-11 – 2022-03-12 (×2): 30 mg via ORAL
  Filled 2022-03-10 (×2): qty 1

## 2022-03-10 MED ORDER — SODIUM CHLORIDE 0.9 % IV SOLN
2.0000 g | Freq: Once | INTRAVENOUS | Status: AC
  Administered 2022-03-10: 2 g via INTRAVENOUS
  Filled 2022-03-10: qty 20

## 2022-03-10 MED ORDER — ENOXAPARIN SODIUM 40 MG/0.4ML IJ SOSY
40.0000 mg | PREFILLED_SYRINGE | INTRAMUSCULAR | Status: DC
  Administered 2022-03-10 – 2022-03-11 (×2): 40 mg via SUBCUTANEOUS
  Filled 2022-03-10 (×2): qty 0.4

## 2022-03-10 MED ORDER — PANTOPRAZOLE SODIUM 40 MG PO TBEC: 40.0000 mg | DELAYED_RELEASE_TABLET | Freq: Every day | ORAL | Status: DC | PRN

## 2022-03-10 MED ORDER — IRBESARTAN 75 MG PO TABS: 75.0000 mg | ORAL_TABLET | Freq: Every day | ORAL | Status: DC

## 2022-03-10 MED ORDER — KETOROLAC TROMETHAMINE 15 MG/ML IJ SOLN
15.0000 mg | Freq: Four times a day (QID) | INTRAMUSCULAR | Status: DC | PRN
  Administered 2022-03-11 – 2022-03-12 (×3): 15 mg via INTRAVENOUS
  Filled 2022-03-10 (×3): qty 1

## 2022-03-10 MED ORDER — FUROSEMIDE 20 MG PO TABS: 40.0000 mg | ORAL_TABLET | Freq: Every day | ORAL | Status: DC

## 2022-03-10 MED ORDER — ONDANSETRON HCL 4 MG/2ML IJ SOLN
4.0000 mg | Freq: Four times a day (QID) | INTRAMUSCULAR | Status: DC | PRN
  Administered 2022-03-11: 4 mg via INTRAVENOUS
  Filled 2022-03-10: qty 2

## 2022-03-10 MED ORDER — ONDANSETRON HCL 4 MG PO TABS: 4.0000 mg | ORAL_TABLET | Freq: Four times a day (QID) | ORAL | Status: DC | PRN

## 2022-03-10 MED ORDER — CLOPIDOGREL BISULFATE 75 MG PO TABS
75.0000 mg | ORAL_TABLET | Freq: Every day | ORAL | Status: DC
  Administered 2022-03-11: 75 mg via ORAL
  Filled 2022-03-10: qty 1

## 2022-03-10 MED ORDER — ALBUTEROL SULFATE (2.5 MG/3ML) 0.083% IN NEBU: 3.0000 mL | INHALATION_SOLUTION | RESPIRATORY_TRACT | Status: DC | PRN

## 2022-03-10 MED ORDER — INSULIN GLARGINE-YFGN 100 UNIT/ML ~~LOC~~ SOLN
24.0000 [IU] | Freq: Every day | SUBCUTANEOUS | Status: DC
  Administered 2022-03-10 – 2022-03-11 (×2): 24 [IU] via SUBCUTANEOUS
  Filled 2022-03-10 (×3): qty 0.24

## 2022-03-10 MED ORDER — ROSUVASTATIN CALCIUM 20 MG PO TABS
40.0000 mg | ORAL_TABLET | Freq: Every day | ORAL | Status: DC
  Administered 2022-03-11 – 2022-03-12 (×2): 40 mg via ORAL
  Filled 2022-03-10 (×2): qty 2

## 2022-03-10 MED ORDER — DULOXETINE HCL 60 MG PO CPEP
60.0000 mg | ORAL_CAPSULE | Freq: Every day | ORAL | Status: DC
  Administered 2022-03-11 – 2022-03-12 (×2): 60 mg via ORAL
  Filled 2022-03-10 (×2): qty 1

## 2022-03-10 MED ORDER — POLYETHYLENE GLYCOL 3350 17 G PO PACK: 17.0000 g | PACK | Freq: Every day | ORAL | Status: DC | PRN

## 2022-03-10 MED ORDER — SEMAGLUTIDE 14 MG PO TABS: 14.0000 mg | ORAL_TABLET | Freq: Every day | ORAL | Status: DC

## 2022-03-10 MED ORDER — ONDANSETRON HCL 4 MG/2ML IJ SOLN
4.0000 mg | Freq: Once | INTRAMUSCULAR | Status: AC
  Administered 2022-03-10: 4 mg via INTRAVENOUS
  Filled 2022-03-10: qty 2

## 2022-03-10 MED ORDER — GLIPIZIDE 5 MG PO TABS
10.0000 mg | ORAL_TABLET | Freq: Two times a day (BID) | ORAL | Status: DC
  Administered 2022-03-11: 10 mg via ORAL
  Filled 2022-03-10: qty 1
  Filled 2022-03-10: qty 2

## 2022-03-10 MED ORDER — ACETAMINOPHEN 325 MG PO TABS: 650.0000 mg | ORAL_TABLET | Freq: Four times a day (QID) | ORAL | Status: DC | PRN

## 2022-03-10 MED ORDER — METRONIDAZOLE 500 MG/100ML IV SOLN
500.0000 mg | Freq: Once | INTRAVENOUS | Status: AC
  Administered 2022-03-10: 500 mg via INTRAVENOUS
  Filled 2022-03-10: qty 100

## 2022-03-10 MED ORDER — ACETAMINOPHEN 650 MG RE SUPP: 650.0000 mg | Freq: Four times a day (QID) | RECTAL | Status: DC | PRN

## 2022-03-10 MED ORDER — MORPHINE SULFATE (PF) 4 MG/ML IV SOLN
4.0000 mg | Freq: Once | INTRAVENOUS | Status: AC
  Administered 2022-03-10: 4 mg via INTRAVENOUS
  Filled 2022-03-10: qty 1

## 2022-03-10 MED ORDER — GABAPENTIN 100 MG PO CAPS
100.0000 mg | ORAL_CAPSULE | Freq: Two times a day (BID) | ORAL | Status: DC
  Administered 2022-03-10 – 2022-03-12 (×4): 100 mg via ORAL
  Filled 2022-03-10 (×4): qty 1

## 2022-03-10 MED ORDER — OXYCODONE HCL 5 MG PO TABS
5.0000 mg | ORAL_TABLET | ORAL | Status: DC | PRN
  Administered 2022-03-11 (×2): 5 mg via ORAL
  Filled 2022-03-10 (×2): qty 1

## 2022-03-10 MED ORDER — METOPROLOL SUCCINATE ER 25 MG PO TB24
25.0000 mg | ORAL_TABLET | Freq: Every day | ORAL | Status: DC
  Administered 2022-03-11 – 2022-03-12 (×2): 25 mg via ORAL
  Filled 2022-03-10 (×2): qty 1

## 2022-03-10 NOTE — ED Notes (Signed)
ED TO INPATIENT HANDOFF REPORT  ED Nurse Name and Phone #: Philip Aspen Name/Age/Gender Eddie Graham 87 y.o. male Room/Bed: 036C/036C  Code Status   Code Status: Full Code  Home/SNF/Other Home Patient oriented to: self, place, time, and situation Is this baseline? Yes   Triage Complete: Triage complete  Chief Complaint Diverticulitis of large intestine with abscess without bleeding [K57.20]  Triage Note EMS stated, abdominal pain for 3 months and its chronic. 20g Left AC.    Allergies No Known Allergies  Level of Care/Admitting Diagnosis ED Disposition     ED Disposition  Admit   Condition  --   Comment  Hospital Area: [facility] [[id number]]  Level of Care: Med-Surg [16]  May admit patient to [facility] or [facility] if equivalent level of care is available:: No  Covid Evaluation: Asymptomatic - no recent exposure (last 10 days) testing not required  Diagnosis: Diverticulitis of large intestine with abscess without bleeding [[id number]]  Admitting Physician: Charise Killian [[id number]]  Attending Physician: Charise Killian [[id number]]  Certification:: I certify this patient will need inpatient services for at least 2 midnights  Estimated Length of Stay: 3          B Medical/Surgery History Past Medical History:  Diagnosis Date   AAA (abdominal aortic aneurysm) ([facility])    Anemia    Anxiety    Arthritis    Asthma    Back pain    Chronic kidney disease    COPD (chronic obstructive pulmonary disease) ([facility])    Depression    Diabetes mellitus without complication ([facility])    Dyspnea    Hypertension    Insomnia    Past Surgical History:  Procedure Laterality Date   CARDIAC SURGERY     CYSTOSCOPY WITH URETHRAL DILATATION N/A 09/30/2021   Procedure: CYSTOSCOPY WITH  URETHRAL BALLOON  DILATATION;  Surgeon: Robley Fries, MD;  Location: [facility];  Service: Urology;  Laterality: N/A;   CYSTOSCOPY WITH URETHRAL DILATATION N/A 12/09/2021   Procedure:  CYSTOSCOPY WITH URETHRAL DILATATION;  Surgeon: Robley Fries, MD;  Location: [facility];  Service: Urology;  Laterality: N/A;  1 HR   PROSTATE SURGERY     TRANSURETHRAL RESECTION OF BLADDER TUMOR N/A 09/30/2021   Procedure: TRANSURETHRAL RESECTION OF BLADDER TUMOR (TURBT);  Surgeon: Robley Fries, MD;  Location: [facility];  Service: Urology;  Laterality: N/A;  90 MINS     A IV Location/Drains/Wounds Patient Lines/Drains/Airways Status     Active Line/Drains/Airways     Name Placement date Placement time Site Days   Peripheral IV 03/10/22 20 G Left Antecubital 03/10/22  1545  Antecubital  less than 1            Intake/Output Last 24 hours No intake or output data in the 24 hours ending 03/10/22 2323  Labs/Imaging Results for orders placed or performed during the hospital encounter of 03/10/22 (from the past 48 hour(s))  CBC with Differential     Status: Abnormal   Collection Time: 03/10/22  9:49 AM  Result Value Ref Range   WBC 9.7 4.0 - 10.5 K/uL   RBC 4.84 4.22 - 5.81 MIL/uL   Hemoglobin 12.7 (L) 13.0 - 17.0 g/dL   HCT 40.8 39.0 - 52.0 %   MCV 84.3 80.0 - 100.0 fL   MCH 26.2 26.0 - 34.0 pg   MCHC 31.1 30.0 - 36.0 g/dL   RDW 15.5 11.5 - 15.5 %   Platelets 283 150 -  400 K/uL   nRBC 0.0 0.0 - 0.2 %   Neutrophils Relative % 75 %   Neutro Abs 7.2 1.7 - 7.7 K/uL   Lymphocytes Relative 15 %   Lymphs Abs 1.5 0.7 - 4.0 K/uL   Monocytes Relative 6 %   Monocytes Absolute 0.6 0.1 - 1.0 K/uL   Eosinophils Relative 3 %   Eosinophils Absolute 0.3 0.0 - 0.5 K/uL   Basophils Relative 0 %   Basophils Absolute 0.0 0.0 - 0.1 K/uL   Immature Granulocytes 1 %   Abs Immature Granulocytes 0.05 0.00 - 0.07 K/uL    Comment: Performed at [facility] [address], [place], [place] [address]  Comprehensive metabolic panel     Status: Abnormal   Collection Time: 03/10/22  9:49 AM  Result Value Ref Range   Sodium 138 135 - 145 mmol/L   Potassium 3.9 3.5 - 5.1 mmol/L   Chloride 104  98 - 111 mmol/L   CO2 20 (L) 22 - 32 mmol/L   Glucose, Bld 178 (H) 70 - 99 mg/dL    Comment: Glucose reference range applies only to samples taken after fasting for at least 8 hours.   BUN 34 (H) 8 - 23 mg/dL   Creatinine, Ser 1.50 (H) 0.61 - 1.24 mg/dL   Calcium 9.1 8.9 - 10.3 mg/dL   Total Protein 7.1 6.5 - 8.1 g/dL   Albumin 3.3 (L) 3.5 - 5.0 g/dL   AST 15 15 - 41 U/L   ALT 15 0 - 44 U/L   Alkaline Phosphatase 75 38 - 126 U/L   Total Bilirubin 0.8 0.3 - 1.2 mg/dL   GFR, Estimated 45 (L) >60 mL/min    Comment: (NOTE) Calculated using the CKD-EPI Creatinine Equation (2021)    Anion gap 14 5 - 15    Comment: Performed at [facility] [address] [place], [place] [address]  Lipase, blood     Status: None   Collection Time: 03/10/22  9:49 AM  Result Value Ref Range   Lipase 36 11 - 51 U/L    Comment: Performed at [facility] [address] [place], [place] [address]  Troponin I (High Sensitivity)     Status: None   Collection Time: 03/10/22  9:49 AM  Result Value Ref Range   Troponin I (High Sensitivity) 10 <18 ng/L    Comment: (NOTE) Elevated high sensitivity troponin I (hsTnI) values and significant  changes across serial measurements may suggest ACS but many other  chronic and acute conditions are known to elevate hsTnI results.  Refer to the "Links" section for chest pain algorithms and additional  guidance. Performed at [facility], [place] [address] [place], [place] [address]   Troponin I (High Sensitivity)     Status: None   Collection Time: 03/10/22  1:37 PM  Result Value Ref Range   Troponin I (High Sensitivity) 9 <18 ng/L    Comment: (NOTE) Elevated high sensitivity troponin I (hsTnI) values and significant  changes across serial measurements may suggest ACS but many other  chronic and acute conditions are known to elevate hsTnI results.  Refer to the "Links" section for chest pain algorithms and additional  guidance. Performed at [facility], [place] [address], [place], [place] [address]   CBG monitoring, ED     Status: Abnormal   Collection Time: 03/10/22  9:46 PM  Result Value Ref Range   Glucose-Capillary 140 (H)  70 - 99 mg/dL    Comment: Glucose reference range applies only to samples taken after fasting for at least 8 hours.   DG Chest 1 View  Result Date: 03/10/2022 CLINICAL DATA:  Left upper abdominal pain for months. Chest pain. Pain radiates to lower part of left ribs. EXAM: CHEST  1 VIEW COMPARISON:  Chest two views 07/10/2021 FINDINGS: Status post median sternotomy. Cardiac silhouette is within normal limits. Mild-to-moderate calcifications within the aortic arch, unchanged. Mildly tortuous thoracic aorta is unchanged. The lungs are again clear. No pleural effusion or pneumothorax. Mild multilevel degenerative disc changes of the thoracic spine. IMPRESSION: No active disease. Electronically Signed   By: Yvonne Kendall M.D.   On: 03/10/2022 10:14    Pending Labs Unresulted Labs (From admission, onward)     Start     Ordered   03/11/22 9476  Basic metabolic panel  Tomorrow morning,   R        03/10/22 1648   03/11/22 0500  CBC  Tomorrow morning,   R        03/10/22 1648   03/10/22 0944  Urinalysis, Routine w reflex microscopic  (ED Abdominal Pain)  Once,   URGENT        03/10/22 0944            Vitals/Pain Today's Vitals   03/10/22 1826 03/10/22 2030 03/10/22 2045 03/10/22 2223  BP: 128/70 129/81 (!) 155/77   Pulse: 73 67 71   Resp: 16 (!) 21 (!) 22   Temp: 97.9 F (36.6 C)   98.2 F (36.8 C)  SpO2: 95% 91% 95%   PainSc: 0-No pain       Isolation Precautions No active isolations  Medications Medications  enoxaparin (LOVENOX) injection 40 mg (40 mg Subcutaneous Given 03/10/22 2153)  acetaminophen (TYLENOL) tablet 650 mg (has no administration in time range)    Or  acetaminophen (TYLENOL) suppository 650 mg (has no administration in time range)  oxyCODONE (Oxy IR/ROXICODONE) immediate  release tablet 5 mg (has no administration in time range)  ketorolac (TORADOL) 15 MG/ML injection 15 mg (has no administration in time range)  polyethylene glycol (MIRALAX / GLYCOLAX) packet 17 g (has no administration in time range)  ondansetron (ZOFRAN) tablet 4 mg (has no administration in time range)    Or  ondansetron (ZOFRAN) injection 4 mg (has no administration in time range)  albuterol (PROVENTIL) (2.5 MG/3ML) 0.083% nebulizer solution 3 mL (has no administration in time range)  clopidogrel (PLAVIX) tablet 75 mg (has no administration in time range)  DULoxetine (CYMBALTA) DR capsule 60 mg (has no administration in time range)  gabapentin (NEURONTIN) capsule 100 mg (100 mg Oral Given 03/10/22 2153)  glipiZIDE (GLUCOTROL) tablet 10 mg (has no administration in time range)  insulin glargine-yfgn (SEMGLEE) injection 24 Units (24 Units Subcutaneous Given 03/10/22 2153)  metoprolol succinate (TOPROL-XL) 24 hr tablet 25 mg (has no administration in time range)  pantoprazole (PROTONIX) EC tablet 40 mg (has no administration in time range)  pioglitazone (ACTOS) tablet 30 mg (has no administration in time range)  rosuvastatin (CRESTOR) tablet 40 mg (has no administration in time range)  Semaglutide TABS 14 mg (has no administration in time range)  ondansetron (ZOFRAN) injection 4 mg (4 mg Intravenous Given 03/10/22 1545)  morphine (PF) 4 MG/ML injection 4 mg (4 mg Intravenous Given 03/10/22 1546)  cefTRIAXone (ROCEPHIN) 2 g in sodium chloride 0.9 % 100 mL IVPB (0 g Intravenous Stopped 03/10/22 1616)    And  metroNIDAZOLE (FLAGYL) IVPB 500 mg (0 mg Intravenous Stopped 03/10/22 1810)    Mobility walks Low fall risk   Focused Assessments     R Recommendations: See Admitting Provider Note  Report given to:   Additional Notes:

## 2022-03-10 NOTE — Hospital Course (Addendum)
Pain in the belly on L side. Has had pain for several years, with eating and drinking. He came in today because the pain is worse, and after eating it hurts. Pain is present all the time, feels sharp. Has not tried anything to make it better, but pain medication he has gotten in ED is helping. 3 days ago he had CT.  Fever N Chills N Weight loss N Dark or bloody stools N Nausea N Vomiting N Bloating/abdominal distention Y Last BM this morning Still eating and drinking normally despite pain HA sometimes  Pmh:AAA, diabetes, ckd, copd, anemia, prostate issues, HF?, bladder cancer  Psh: prostate surgery not long ago--has had 5 separate procedures???, open heart surgery 2011  Meds: albuterol Gaba Cymbalta Plavix Lasix Lantus 24 units daily Glipizide Metoprolol Protonix Flomax Semaglutide  Trazodone Crestor Tylenol B12 montelukast  Allergies: none  Sochx: lives in [place] alone, wife is in [place]. All adls and iadls alone at home. Has a cane. No tobacco. No alcohol. No other substances.  Famhx: M diabetes, died from something in her lungs D unknown Siblings unknown

## 2022-03-10 NOTE — ED Triage Notes (Signed)
EMS stated, abdominal pain for 3 months and its chronic. 20g Left AC.

## 2022-03-10 NOTE — H&P (Cosign Needed Addendum)
Date: 03/10/2022               Patient Name:  Eddie Graham MRN: [id number]  DOB: 1935-04-11 Age / Sex: 87 y.o., male   PCP: Kristopher Glee., MD              Medical Service: Internal Medicine Teaching Service              Attending Physician: Dr. Charise Killian, MD    First Contact: Corene Cornea, [place] [address] Pager: [phone]  Second Contact: Dr. Linward Natal Pager: [phone]  Third Contact Dr.  Farrel Gordon Pager: [phone]       After Hours (After 5p/  First Contact Pager: [phone]  weekends / holidays): Second Contact Pager: [phone]   Chief Complaint: Abdominal pain   History of Present Illness:   Eddie Graham is a 87 y.o. male with a past medical history of diverticulitis, bladder cancer status post cystoscopy with urethral dilation August 2023, chronic abdominal pain, AAA, T2DM, diabetic neuropathy, HTN, HLD, CKD, COPD, and anemia presenting for several weeks of worsening abdominal pain. The patient reports several years of abdominal pain, worsening in the past several weeks, prompting him to present to the ED. He had a CT of his abdomen 3 days ago, and was told to present to the ED. He has not been updated on the imaging results. He was supposed to go to another hospital, but was brought to [facility] instead. He reports his pain is left-sided, sharp, and constant located from his left chest down to his left pelvic area. He also notes some abdominal bloating and mild headache. His pain worsens with eating and drinking, but he has been able to tolerate his normal diet.  He has not taken any medication for this abdominal pain. His last bowel movement was this morning and it was normal. Denies nausea, vomiting, constipation, diarrhea, and bloody stools. Denies fever, chills, unintentional weight loss. He reports his pain is similar to previous admissions for diverticulitis in the past. His abdominal pain is currently well controlled on the pain medications in the ED.   ED  course: CBC w/ diff wnl, CMP wnl, serial troponins normal, CXR negative. Received IV Rocephin 2 g and IV Flagyl 500 mg, Zofran 4 mg, Morphine 4 mg IV.   Meds:  Current Meds  Medication Sig   acetaminophen (TYLENOL) 500 MG tablet Take 1,000 mg by mouth 2 (two) times daily as needed (pain).   LANTUS SOLOSTAR 100 UNIT/ML Solostar Pen Inject 24 Units into the skin at bedtime.   pioglitazone (ACTOS) 30 MG tablet Take 30 mg by mouth daily.   Semaglutide 14 MG TABS Take 14 mg by mouth daily.   valsartan (DIOVAN) 80 MG tablet Take 80 mg by mouth daily.     Allergies: Allergies as of 03/10/2022   (No Known Allergies)   Past Medical History:  Diagnosis Date   AAA (abdominal aortic aneurysm) (HCC)    Anemia    Anxiety    Arthritis    Asthma    Back pain    Chronic kidney disease    COPD (chronic obstructive pulmonary disease) (HCC)    Depression    Diabetes mellitus without complication ([place])    Dyspnea    Hypertension    Insomnia     Family History:  - Mother: Diabetes, HTN, died from something involving her lungs  - Father: Unsure - Siblings: Unsure  Social History:  - Home: Currently lives alone in [place]; His  wife lives in [place] - He performs all IADLs and ADLs independently  - Ambulates with a cane - Tobacco: Denies - Alcohol: Denies - Smoking: Denies  Review of Systems: A complete ROS was negative except as per HPI.   Physical Exam: Blood pressure 126/64, pulse 75, temperature 98.5 F (36.9 C), resp. rate 18, SpO2 94 %.  General: Appears to be resting comfortably; No acute distress HEENT: Normocephalic, atraumatic; moist mucus membranes Cardiovascular: RRR; no murmurs, rubs, or gallops; 2+ radial pulses Pulmonary: Normal respiratory effort; Lungs clear to auscultation bilaterally; no wheezes, rales, or rhonchi Abdomen: Left upper and lower quadrant tenderness; Dullness to percussion; mild distension; no guarding; Normal bowel sounds; No masses Extremities: Warm  and well perfused, no edema Neuro: A&O x4; No focal deficits  Skin: Warm and dry   EKG: personally reviewed my interpretation is sinus rhythm, normal axis, PACs  CXR: personally reviewed my interpretation is no acute abnormalities    Assessment & Plan by Problem: Principal Problem:   Diverticulitis of large intestine with abscess without bleeding  Eddie Graham is a 87 y.o. male with a past medical history of diverticulitis, bladder cancer status post cystoscopy with urethral dilation August 2023, chronic abdominal pain, AAA, T2DM, diabetic neuropathy, HTN, HLD, CKD, COPD, and anemia presenting for worsening left-sided abdominal pain, admitted for diverticulitis.   #Diverticulitis with abscess The patient presented for worsening chronic abdominal pain, similar to diverticulitis pain in the past. 11/08 CTAP with acute on chronic diverticulitis with an area suspicious for chronic intramural abscess. Labs wnl. HDS. Received 1 dose IV Rocephin and Flagyl in the ED. Plan to switch to Zosyn tomorrow. Will continue pain control with oxycodone, toradol prn and encourage PO as tolerated.  - Discontinue IV Rocephin 2 g and Flagyl 500 mg - Start IV Zosyn 4.5 mg q6  tomorrow - Zofran 4 mg for nausea - PO Tylenol 650 mg, PO Oxycodone 5 mg q4 prn, IV Toradol 15 mg q6 PRN for pain - Heart healthy diet - Lovenox VTE prophylaxis   #History of bladder cancer  The patient is s/p cystoscopy with urethral dilation in August 2023. Denies any complications. He is followed by [facility]. No suprapubic tenderness or dysuria on admission.  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 Unchanged on 11/8 CTAP. He is followed by vascular surgery.  #PAD Continue plavix.  #COPD Continue albuterol prn. No signs of acute exacerbation. On chart review, may need to be on [place]. Will clarify with daughter.  #T2DM #Diabetic polyneuropathy  Last A1c was 14.2 on  12/05/21. Blood sugar today is 178.  - Semglee 24 u qhs - continue semaglutide 14 mg daily - continue glipizide - continue pioglitazone - Continue home Cymbalta 60 mg daily - continue home gabapentin  #Hyperlipidemia Continue home Crestor 40 mg daily  #CKD IIIb Creatinine appears at baseline. Will continue to monitor and encourage PO fluid intake.  - Avoid nephrotoxic agents  -trend BMP  #HTN Normotensive on admission at 126/64. Will continue to monitor.  -hold olmesartan, metoprolol, lasix  #GERD Home protonix.  Dispo: Admit patient to Inpatient with expected length of stay greater than 2 midnights.  Signed: Linward Natal, MD 03/10/2022, 6:11 PM   Attestation for Student Documentation:  I personally was present and performed or re-performed the history, physical exam and medical decision-making activities of this service and have verified that the service and findings are accurately documented in the student's note.  Linward Natal, MD  03/10/2022, 6:11 PM

## 2022-03-10 NOTE — ED Provider Triage Note (Addendum)
Emergency Medicine Provider Triage Evaluation Note  Eddie Graham , a 87 y.o. male  was evaluated in triage.  Pt complains of left sided abdominal pain onset 3 months.  Notes that he was in [place] yesterday and had a procedure to look down his throat.  Also notes left-sided chest wall pain.  No meds prior to arrival.  Denies nausea, vomiting, shortness of breath, diarrhea, constipation.  Review of Systems  Positive:  Negative  Physical Exam  BP (!) 152/86 (BP Location: Right Arm)   Pulse 81   Temp 98.3 F (36.8 C)   Resp 18   SpO2 97%  Gen:   Awake, no distress   Resp:  Normal effort  MSK:   Moves extremities without difficulty  Other:    Medical Decision Making  Medically screening exam initiated at 9:42 AM.  Appropriate orders placed.  Nery Sanchez-Feliciano was informed that the remainder of the evaluation will be completed by another provider, this initial triage assessment does not replace that evaluation, and the importance of remaining in the ED until their evaluation is complete.     Harold Barban 03/10/22 2458   9:49 AM - Call from Nurse Navigator, Loma Sousa through [facility] who noted that patient had a CT abdomen pelvis completed on 03/04/2022 (see findings below).  Nurse navigator Loma Sousa notes that the GI specialist reviewed the CT scan and recommended admission with IV antibiotics and general surgeon consult at [facility].  However, patient came to [facility] instead.  CT AP w contrast: 03/04/22: 1. CT findings consistent with acute on chronic diverticulitis involving the upper sigmoid colon. There is also an elongated curvilinear air collection in a thickened colonic wall suspicious for a chronic intramural abscess. 2. Advanced atherosclerotic calcification involving the aorta and branch vessels with a 17 x 12 mm saccular aneurysm involving the right-side of the lower abdominal aorta. Recommend referral to a vascular specialist. 3. 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 4. Aortic atherosclerosis.   9:50 AM - Discussed with RN that patient is in need of a room. RN aware and working on room placement.    Delmi Fulfer A, PA-C 03/10/22 [phone]

## 2022-03-10 NOTE — ED Notes (Signed)
Spoke to pt's daughter and gave update regarding plan of care.

## 2022-03-10 NOTE — ED Provider Notes (Signed)
[facility] EMERGENCY DEPARTMENT Provider Note   CSN: [id number] Arrival date & time: 03/10/22  7829     History  No chief complaint on file.   Eddie Graham is a 87 y.o. male.  Patient is an 87 year old male with a history of diabetes, hypertension, AAA, CKD, COPD, anemia and chronic abdominal pain who is presenting today due to abnormal CT findings.  Patient reports he has been having abdominal pain for quite some time but seem to been getting worse in the left side of his abdomen.  Every time he eats it hurts.  At the end of October he saw a GI doctor who ordered a CT which was done on 03/04/2022.  The CT showed acute on chronic diverticulitis with intramural abscess.  Patient really replies he has not been on any antibiotics in the last few weeks.  He did complete a course of Keflex mid October for a UTI but denies any urinary symptoms at this time.  He has not had any vomiting and states his stools have been normal but he is not eating as much because it is too painful to eat.  He is taking Tylenol without any relief.  He has not noticed any fever.  [facility] and they reviewed his information today and told him he needed to go to the hospital for IV antibiotics and general surgery consult.  Patient came to [facility] instead.  The history is provided by the patient and medical records. The history is limited by a language barrier. A language interpreter was used.       Home Medications Prior to Admission medications   Medication Sig Start Date End Date Taking? Authorizing Provider  acetaminophen (TYLENOL) 500 MG tablet Take 1,000 mg by mouth 2 (two) times daily as needed (pain).    [provider]  albuterol (VENTOLIN HFA) 108 (90 Base) MCG/ACT inhaler Inhale 2 puffs into the lungs every 4 (four) hours as needed for wheezing. Patient not taking: Reported on 12/05/2021 11/11/20   [provider]   amoxicillin-clavulanate (AUGMENTIN) 875-125 MG tablet Take 1 tablet by mouth every 12 (twelve) hours. 12/20/21   Geradine Girt, DO  Carboxymethylcellulose Sodium (EYE DROPS OP) Place 2 drops into both eyes as needed (dryness/itching).    [provider]  clopidogrel (PLAVIX) 75 MG tablet Take 1 tablet (75 mg total) by mouth daily. 12/23/21   Geradine Girt, DO  Cyanocobalamin (B-12 PO) Take 1 capsule by mouth daily.    [provider]  diclofenac Sodium (VOLTAREN) 1 % GEL Apply 2 g topically 4 (four) times daily. 12/20/21   Geradine Girt, DO  DULoxetine (CYMBALTA) 60 MG capsule Take 60 mg by mouth daily. 02/07/21   [provider]  furosemide (LASIX) 40 MG tablet Take 1 tablet (40 mg total) by mouth daily. 12/16/21   Geradine Girt, DO  gabapentin (NEURONTIN) 100 MG capsule Take 100 mg by mouth 2 (two) times daily. 02/25/21   [provider]  glipiZIDE (GLUCOTROL) 10 MG tablet Take 10 mg by mouth 2 (two) times daily before a meal. 03/25/21   [provider]  LANTUS SOLOSTAR 100 UNIT/ML Solostar Pen Inject 15 Units into the skin at bedtime. 09/07/21   [provider]  lidocaine (LIDODERM) 5 % Place 1 patch onto the skin daily. Remove & Discard patch within 12 hours or as directed by MD 11/01/21   Blue, Soijett A, PA-C  metoprolol succinate (  TOPROL-XL) 25 MG 24 hr tablet Take 25 mg by mouth daily. 04/07/21   [provider]  montelukast (SINGULAIR) 10 MG tablet Take 10 mg by mouth daily. 03/19/21   [provider]  pantoprazole (PROTONIX) 40 MG tablet Take 40 mg by mouth daily as needed (heartburn). 02/14/21   [provider]  polyethylene glycol (MIRALAX / GLYCOLAX) 17 g packet Take 17 g by mouth daily as needed for mild constipation. 12/20/21   Geradine Girt, DO  rosuvastatin (CRESTOR) 40 MG tablet Take 40 mg by mouth daily. 04/07/21   [provider]  Semaglutide 14 MG TABS Take 14 mg by mouth daily. 03/14/21    [provider]  tamsulosin (FLOMAX) 0.4 MG CAPS capsule Take 0.4 mg by mouth daily. Patient not taking: Reported on 12/05/2021 11/23/21   [provider]  traZODone (DESYREL) 50 MG tablet Take 100 mg by mouth daily. 03/14/21   [provider]      Allergies    Patient has no known allergies.    Review of Systems   Review of Systems  Physical Exam Updated Vital Signs BP 126/64 (BP Location: Right Arm)   Pulse 75   Temp 98.5 F (36.9 C)   Resp 18   SpO2 94%  Physical Exam Vitals and nursing note reviewed.  Constitutional:      General: He is not in acute distress.    Appearance: He is well-developed.  HENT:     Head: Normocephalic and atraumatic.  Eyes:     Conjunctiva/sclera: Conjunctivae normal.     Pupils: Pupils are equal, round, and reactive to light.  Cardiovascular:     Rate and Rhythm: Normal rate and regular rhythm.     Heart sounds: No murmur heard. Pulmonary:     Effort: Pulmonary effort is normal. No respiratory distress.     Breath sounds: Normal breath sounds. No wheezing or rales.  Abdominal:     General: There is no distension.     Palpations: Abdomen is soft.     Tenderness: There is abdominal tenderness in the left upper quadrant and left lower quadrant. There is guarding. There is no left CVA tenderness or rebound.  Musculoskeletal:        General: No tenderness. Normal range of motion.     Cervical back: Normal range of motion and neck supple.     Right lower leg: No edema.     Left lower leg: No edema.  Skin:    General: Skin is warm and dry.     Findings: No erythema or rash.  Neurological:     Mental Status: He is alert and oriented to person, place, and time. Mental status is at baseline.  Psychiatric:        Mood and Affect: Mood normal.        Behavior: Behavior normal.     ED Results / Procedures / Treatments   Labs (all labs ordered are listed, but only abnormal results are displayed) Labs Reviewed  CBC  WITH DIFFERENTIAL/PLATELET - Abnormal; Notable for the following components:      Result Value   Hemoglobin 12.7 (*)    All other components within normal limits  COMPREHENSIVE METABOLIC PANEL - Abnormal; Notable for the following components:   CO2 20 (*)    Glucose, Bld 178 (*)    BUN 34 (*)    Creatinine, Ser 1.50 (*)    Albumin 3.3 (*)    GFR, Estimated 45 (*)  All other components within normal limits  LIPASE, BLOOD  URINALYSIS, ROUTINE W REFLEX MICROSCOPIC  TROPONIN I (HIGH SENSITIVITY)  TROPONIN I (HIGH SENSITIVITY)    EKG EKG Interpretation  Date/Time:  Tuesday March 10 2022 09:53:33 EST Ventricular Rate:  79 PR Interval:  158 QRS Duration: 88 QT Interval:  392 QTC Calculation: 449 R Axis:   -53 Text Interpretation: Sinus rhythm with Premature atrial complexes with Abberant conduction Left axis deviation Cannot rule out Anterior infarct , age undetermined T wave inversion RESOLVED SINCE PREVIOUS When compared with ECG of 17-Dec-2021 15:19, PREVIOUS ECG IS PRESENT Confirmed by Blanchie Dessert [phone]) on 03/10/2022 1:56:34 PM  Radiology DG Chest 1 View  Result Date: 03/10/2022 CLINICAL DATA:  Left upper abdominal pain for months. Chest pain. Pain radiates to lower part of left ribs. EXAM: CHEST  1 VIEW COMPARISON:  Chest two views 07/10/2021 FINDINGS: Status post median sternotomy. Cardiac silhouette is within normal limits. Mild-to-moderate calcifications within the aortic arch, unchanged. Mildly tortuous thoracic aorta is unchanged. The lungs are again clear. No pleural effusion or pneumothorax. Mild multilevel degenerative disc changes of the thoracic spine. IMPRESSION: No active disease. Electronically Signed   By: Yvonne Kendall M.D.   On: 03/10/2022 10:14    Procedures Procedures    Medications Ordered in ED Medications  ondansetron (ZOFRAN) injection 4 mg (has no administration in time range)  morphine (PF) 4 MG/ML injection 4 mg (has no administration in  time range)  cefTRIAXone (ROCEPHIN) 2 g in sodium chloride 0.9 % 100 mL IVPB (has no administration in time range)    And  metroNIDAZOLE (FLAGYL) IVPB 500 mg (has no administration in time range)    ED Course/ Medical Decision Making/ A&P                           Medical Decision Making Amount and/or Complexity of Data Reviewed External Data Reviewed: notes.    Details: From PCP and GI Labs: ordered. Decision-making details documented in ED Course. Radiology: ordered and independent interpretation performed. Decision-making details documented in ED Course. ECG/medicine tests: ordered and independent interpretation performed. Decision-making details documented in ED Course.  Risk Prescription drug management.   Pt with multiple medical problems and comorbidities and presenting today with a complaint that caries a high risk for morbidity and mortality.  Here today with worsening abdominal pain.  Patient had a CT done on the eighth which showed acute on chronic diverticulitis with abscess.  Patient was instructed to go to [facility] for IV antibiotics and general surgery consult but he came here instead.  Patient is not displaying signs of sepsis at this time.  He has not had any specific antibiotics this month but last month did complete a 5-day course of Keflex for urinary symptoms.  His prior colonoscopy was in 2017 in [place].  At that time he had a polyp that was removed and had diverticulosis but no other acute findings.  Patient does take Plavix and has a known AAA but does not appear to be significantly different on CT.  Low suspicion for ruptured AAA.  Suspect patient's abdominal pain today is related to acute on chronic diverticulitis with abscess.  Patient was given IV Rocephin and Flagyl.  I independently interpreted patient's labs and EKG.  Labs today and CBC, troponin and CMP all without acute findings.  Creatinine of 1.5 which appears to be his baseline.  Lipase is  within normal limits.  EKG with improved findings today with resolution of T wave inversion from prior.  I have independently visualized and interpreted pt's images today.  Chest x-ray without acute findings.  Will call medicine for admission for diverticulitis with abscess.  Discussed findings with patient's daughter Eddie Graham.  She currently is living in [place] and would like to be kept updated.  Her phone number is accurate in the file.        Final Clinical Impression(s) / ED Diagnoses Final diagnoses:  Diverticulitis of large intestine with abscess without bleeding    Rx / DC Orders ED Discharge Orders     None         Blanchie Dessert, MD 03/10/22 1504

## 2022-03-10 NOTE — ED Notes (Signed)
Patient in bed watching TV, No s/s of any distress. No verbal c/o pain or discomfort. Patient able to follow verbal commands. Will continue to monitor.

## 2022-03-11 ENCOUNTER — Inpatient Hospital Stay (HOSPITAL_COMMUNITY): Payer: Medicare Other

## 2022-03-11 ENCOUNTER — Encounter (HOSPITAL_COMMUNITY): Payer: Self-pay | Admitting: Internal Medicine

## 2022-03-11 DIAGNOSIS — K5720 Diverticulitis of large intestine with perforation and abscess without bleeding: Secondary | ICD-10-CM

## 2022-03-11 LAB — BASIC METABOLIC PANEL
Anion gap: 8 (ref 5–15)
BUN: 32 mg/dL — ABNORMAL HIGH (ref 8–23)
CO2: 26 mmol/L (ref 22–32)
Calcium: 8.8 mg/dL — ABNORMAL LOW (ref 8.9–10.3)
Chloride: 103 mmol/L (ref 98–111)
Creatinine, Ser: 1.62 mg/dL — ABNORMAL HIGH (ref 0.61–1.24)
GFR, Estimated: 41 mL/min — ABNORMAL LOW (ref >60)
Glucose, Bld: 131 mg/dL — ABNORMAL HIGH (ref 70–99)
Potassium: 4.2 mmol/L (ref 3.5–5.1)
Sodium: 137 mmol/L (ref 135–145)

## 2022-03-11 LAB — CBC
HCT: 38.3 % — ABNORMAL LOW (ref 39.0–52.0)
Hemoglobin: 12.3 g/dL — ABNORMAL LOW (ref 13.0–17.0)
MCH: 26.6 pg (ref 26.0–34.0)
MCHC: 32.1 g/dL (ref 30.0–36.0)
MCV: 82.7 fL (ref 80.0–100.0)
Platelets: 273 10*3/uL (ref 150–400)
RBC: 4.63 MIL/uL (ref 4.22–5.81)
RDW: 15.8 % — ABNORMAL HIGH (ref 11.5–15.5)
WBC: 9 10*3/uL (ref 4.0–10.5)
nRBC: 0 % (ref 0.0–0.2)

## 2022-03-11 LAB — GLUCOSE, CAPILLARY
Glucose-Capillary: 104 mg/dL — ABNORMAL HIGH (ref 70–99)
Glucose-Capillary: 106 mg/dL — ABNORMAL HIGH (ref 70–99)

## 2022-03-11 MED ORDER — IOHEXOL 350 MG/ML SOLN
75.0000 mL | Freq: Once | INTRAVENOUS | Status: AC | PRN
  Administered 2022-03-11: 75 mL via INTRAVENOUS

## 2022-03-11 MED ORDER — BISACODYL 10 MG RE SUPP
10.0000 mg | Freq: Once | RECTAL | Status: AC
  Administered 2022-03-11: 10 mg via RECTAL
  Filled 2022-03-11: qty 1

## 2022-03-11 MED ORDER — METRONIDAZOLE 500 MG/100ML IV SOLN
500.0000 mg | Freq: Two times a day (BID) | INTRAVENOUS | Status: DC
  Administered 2022-03-11: 500 mg via INTRAVENOUS
  Filled 2022-03-11: qty 100

## 2022-03-11 MED ORDER — TRAZODONE HCL 100 MG PO TABS
100.0000 mg | ORAL_TABLET | Freq: Every day | ORAL | Status: DC
  Administered 2022-03-11: 100 mg via ORAL
  Filled 2022-03-11: qty 1

## 2022-03-11 MED ORDER — SODIUM CHLORIDE 0.9 % IV SOLN
2.0000 g | INTRAVENOUS | Status: DC
  Administered 2022-03-11: 2 g via INTRAVENOUS
  Filled 2022-03-11: qty 20

## 2022-03-11 MED ORDER — POLYETHYLENE GLYCOL 3350 17 G PO PACK
17.0000 g | PACK | Freq: Two times a day (BID) | ORAL | Status: DC
  Administered 2022-03-11 – 2022-03-12 (×2): 17 g via ORAL
  Filled 2022-03-11 (×2): qty 1

## 2022-03-11 MED ORDER — DOCUSATE SODIUM 100 MG PO CAPS
100.0000 mg | ORAL_CAPSULE | Freq: Two times a day (BID) | ORAL | Status: DC
  Administered 2022-03-11: 100 mg via ORAL
  Filled 2022-03-11 (×2): qty 1

## 2022-03-11 NOTE — Care Management (Signed)
  Transition of Care (TOC) Screening Note   Patient Details  Name: Eddie Graham Date of Birth: 27-May-1934   Transition of Care [facility] CM/SW Contact:    Carles Collet, RN Phone Number: 03/11/2022, 8:18 AM    Transition of Care Department [facility]) has reviewed patient we will continue to monitor patient advancement through interdisciplinary progression rounds. If new patient transition needs arise, please place a TOC consult.   Diverticulitis with abscess, IV Abx, from home alone, Spanish speaking.

## 2022-03-11 NOTE — Progress Notes (Signed)
New Admission Note: ? Arrival Method:Stretcher  Mental Orientation: AxOx4 Telemetry: Box 1 Assessment: Completed Skin: Refer to flowsheet IV: Left forearm  Pain: 0 Tubes: None  Safety Measures: Safety Fall Prevention Plan discussed with patient. Family: None at the bedside Orders have been reviewed and are being implemented. Will continue to monitor the patient. Call light has been placed within reach and bed alarm has been activated.

## 2022-03-11 NOTE — Progress Notes (Addendum)
Subjective:  The patient initially reported improved abdominal pain and abdominal distension; however, his left-sided abdominal pain returned just before the abdominal exam today with no obvious trigger. He did not sleep well last night due to insomnia. He has been able to eat and drink without difficulty. No nausea or vomiting. His last bowel movement was yesterday morning, 11/14, at home. Denies any blood in his stool.   Objective:  Vital signs in last 24 hours: Vitals:   03/11/22 0141 03/11/22 0509 03/11/22 0843 03/11/22 1124  BP: 126/62 127/74 132/62 (!) 140/81  Pulse: 88 70 70 66  Resp: '17 18 20 20  '$ Temp: 97.6 F (36.4 C) 97.6 F (36.4 C) 97.7 F (36.5 C) 97.8 F (36.6 C)  TempSrc: Oral Oral Oral Oral  SpO2: 96% 95% 96% 97%  Weight: 85 kg      Weight change:   Intake/Output Summary (Last 24 hours) at 03/11/2022 1525 Last data filed at 03/11/2022 1405 Gross per 24 hour  Intake 360 ml  Output 1450 ml  Net -1090 ml   General: Appears to be resting comfortably in bed; no acute distress Cardiovascular: RRR; no murmurs, rubs, or gallops; 2+ radial pulses Pulmonary: Normal respiratory effort; Lungs clear to auscultation anteriorly; no wheezes, rales, or rhonchi Abdomen: Left upper and lower quadrant tenderness to palpation with rebound tenderness; mild distension; no guarding; no masses or organomegaly  Skin: Warm and dry    Assessment/Plan:  Principal Problem:   Diverticulitis of large intestine with abscess without bleeding   Eddie Graham is a 87 y.o. male with a past medical history of diverticulitis, bladder cancer status post cystoscopy with urethral dilation August 2023, chronic abdominal pain, AAA, T2DM, diabetic neuropathy, HTN, HLD, CKD, COPD, and anemia presenting for worsening left-sided abdominal pain, admitted for diverticulitis with possible abscess on CT Abdomen pelvis.   #Diverticulitis with possible abscess CBC and CMP today are  unremarkable. He remains hemodynamically stable. The patient had a sudden recurrence of left abdominal pain today with rebound tenderness. Will order repeat CTAP w contrast to better characterize previously reported abscess. Consulted general surgery who will evaluate. Will make patient NPO and discontinue plavix. Will continue to IV Rocephin and Flagyl.  - CT Abdomen pelvis with contrast - General surgery consulted, appreciate assistance - IV Rocephin 2 g every 24 hours - IV Flagyl 500 mg every 12 hours - PO Tylenol 650 mg, PO Oxycodone 5 mg q4 prn, IV Toradol 15 mg q6 PRN for pain - NPO - Lovenox VTE prophylaxis   #Insomnia  Continue home Trazodone 100 mg at bedtime.   #History of bladder cancer  The patient is s/p cystoscopy with urethral dilation in August 2023. Denies any complications. He is followed by [facility]. No suprapubic tenderness or dysuria on admission.   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 Unchanged on 11/8 CTAP. He is followed by vascular surgery.   #PAD Discontinue plavix in anticipation of surgery assessment. -restart when able   #COPD Continue albuterol prn. No signs of acute exacerbation. On chart review, may need to be on [place]. -Will clarify with daughter tomorrow   #T2DM #Diabetic polyneuropathy  Last A1c was 14.2 on 12/05/21. Blood sugar today is 131. Will hold glipizide to avoid hypoglycemia.  - Hold  glipizide - Semglee 24 u qhs - continue semaglutide 14 mg daily - continue pioglitazone - Continue home Cymbalta 60 mg daily - continue home gabapentin   #Hyperlipidemia Continue home  Crestor 40 mg daily   #CKD IIIb Creatinine with modest uptrend. Will continue to monitor and encourage PO fluid intake.  - Avoid nephrotoxic agents  -trend BMP   #HTN Normotensive today at 127/74. Will continue to monitor.  -hold olmesartan, metoprolol, lasix   #GERD Home protonix.   LOS: 1 day   Linward Natal, MD 03/11/2022, 3:25 PM   Attestation for Student Documentation:  I personally was present and performed or re-performed the history, physical exam and medical decision-making activities of this service and have verified that the service and findings are accurately documented in the student's note.  Linward Natal, MD 03/11/2022, 3:25 PM

## 2022-03-11 NOTE — Consult Note (Signed)
Eddie Graham [birth year]  [id number].    Requesting MD: Saverio Danker, MD Chief Complaint/Reason for Consult: diverticulitis with abscess  Spanish interpreter used for this visit  HPI:  Eddie Graham is an 87 y/o male with a PMH CAD s/p open heart surgery on plavix (last dose 824 this am), COPD, HTN, AAA (3.3 cm), DM, and CKD who presents to the hospital at the direction of his doctor after an outpatient CT scan 03/04/22 revealed sigmoid diverticulitis with possible intramural abscess. Patient has been having left sided abdominal pain, worse with PO intake, since October 2023. Tried bentyl and PPI without relief of sxs. He was seen by GI 02/12/22 and CT scan was ordered. CT scan was not performed until 11/8. Patient thinks he was taking a new medication daily since the CT scan but was not sure if this was an abx. However, it also sounds patient was notified yesterday to come to the hospital for IV antibiotics. He denies fever, chills, nausea, vomiting, diarrhea, constipation, melena, or hematochezia. Tolerating diet prior to admission but again noted increased pain with po intake.  Last colonoscopy 05/2015 in [place] with polyps.  He underwent several colonoscopies in the past with repeat polyps. I cannot see these records. He denies hx of diverticulitis in the past. We were asked to see by primary. He has a repeat CT scan pending.   Blood thinners: plavix  Allergies: NKDA   ROS: ROS As above, see hpi  No family history on file.  Past Medical History:  Diagnosis Date   AAA (abdominal aortic aneurysm) (HCC)    Anemia    Anxiety    Arthritis    Asthma    Back pain    Chronic kidney disease    COPD (chronic obstructive pulmonary disease) ([facility])    Depression    Diabetes mellitus without complication ([place])    Dyspnea    Hypertension    Insomnia     Past Surgical History:  Procedure Laterality Date   CARDIAC SURGERY     CYSTOSCOPY WITH URETHRAL DILATATION N/A 09/30/2021    Procedure: CYSTOSCOPY WITH  URETHRAL BALLOON  DILATATION;  Surgeon: Robley Fries, MD;  Location: [facility];  Service: Urology;  Laterality: N/A;   CYSTOSCOPY WITH URETHRAL DILATATION N/A 12/09/2021   Procedure: CYSTOSCOPY WITH URETHRAL DILATATION;  Surgeon: Robley Fries, MD;  Location: [facility];  Service: Urology;  Laterality: N/A;  1 HR   PROSTATE SURGERY     TRANSURETHRAL RESECTION OF BLADDER TUMOR N/A 09/30/2021   Procedure: TRANSURETHRAL RESECTION OF BLADDER TUMOR (TURBT);  Surgeon: Robley Fries, MD;  Location: [facility];  Service: Urology;  Laterality: N/A;  16 MINS    Social History:  reports that he has never smoked. He has never used smokeless tobacco. He reports that he does not currently use alcohol. He reports that he does not currently use drugs.  Allergies: No Known Allergies  Medications Prior to Admission  Medication Sig Dispense Refill   acetaminophen (TYLENOL) 500 MG tablet Take 1,000 mg by mouth 2 (two) times daily as needed (pain).     albuterol (VENTOLIN HFA) 108 (90 Base) MCG/ACT inhaler Inhale 2 puffs into the lungs every 4 (four) hours as needed for wheezing.     clopidogrel (PLAVIX) 75 MG tablet Take 1 tablet (75 mg total) by mouth daily.     Cyanocobalamin (B-12 PO) Take 1 capsule by mouth daily.     furosemide (LASIX) 40 MG tablet Take 1 tablet (40  mg total) by mouth daily. (Patient taking differently: Take 40 mg by mouth 2 (two) times daily.) 30 tablet    gabapentin (NEURONTIN) 100 MG capsule Take 100 mg by mouth 3 (three) times daily.     glipiZIDE (GLUCOTROL) 10 MG tablet Take 10 mg by mouth daily.     LANTUS SOLOSTAR 100 UNIT/ML Solostar Pen Inject 24 Units into the skin at bedtime.     metoprolol succinate (TOPROL-XL) 25 MG 24 hr tablet Take 25 mg by mouth daily.     pantoprazole (PROTONIX) 40 MG tablet Take 40 mg by mouth daily.     rosuvastatin (CRESTOR) 40 MG tablet Take 40 mg by mouth daily.     Semaglutide 14 MG TABS Take 14 mg by mouth daily.      valsartan (DIOVAN) 80 MG tablet Take 80 mg by mouth at bedtime.     amoxicillin-clavulanate (AUGMENTIN) 875-125 MG tablet Take 1 tablet by mouth every 12 (twelve) hours. (Patient not taking: Reported on 03/11/2022) 10 tablet 0   Carboxymethylcellulose Sodium (EYE DROPS OP) Place 2 drops into both eyes as needed (dryness/itching).     diclofenac Sodium (VOLTAREN) 1 % GEL Apply 2 g topically 4 (four) times daily. 100 g 0   DULoxetine (CYMBALTA) 60 MG capsule Take 60 mg by mouth daily.     lidocaine (LIDODERM) 5 % Place 1 patch onto the skin daily. Remove & Discard patch within 12 hours or as directed by MD 30 patch 0   montelukast (SINGULAIR) 10 MG tablet Take 10 mg by mouth daily.     pioglitazone (ACTOS) 30 MG tablet Take 30 mg by mouth daily.     polyethylene glycol (MIRALAX / GLYCOLAX) 17 g packet Take 17 g by mouth daily as needed for mild constipation. 14 each 0   tamsulosin (FLOMAX) 0.4 MG CAPS capsule Take 0.4 mg by mouth daily. (Patient not taking: Reported on 12/05/2021)     traZODone (DESYREL) 50 MG tablet Take 100 mg by mouth daily.       Physical Exam: Blood pressure (!) 140/81, pulse 66, temperature 97.8 F (36.6 C), temperature source Oral, resp. rate 20, weight 85 kg, SpO2 97 %. General: pleasant, WD/WN male who is laying in bed in NAD HEENT: head is normocephalic, atraumatic.  Sclera are noninjected.  PERRL.  Ears and nose without any masses or lesions.  Mouth is pink and moist. Dentition fair Heart: regular, rate, and rhythm.  Palpable pedal pulses bilaterally  Lungs: CTAB, no wheezes, rhonchi, or rales noted.  Respiratory effort nonlabored Abd:  Soft, ND, left side abdominal pain without rigidity or guarding. +BS. No masses or organomegaly. R inguinal hernia soft and non-tender. He does have some bruising on the left side of his abdomen, he denies trauma - unsure if this is from subq medications? MS: no BUE or BLE edema, calves soft and nontender Skin: warm and dry  Psych:  A&Ox4 with an appropriate affect Neuro: cranial nerves grossly intact, normal speech, thought process intact, moves all extremities, gait not assessed  Results for orders placed or performed during the hospital encounter of 03/10/22 (from the past 48 hour(s))  CBC with Differential     Status: Abnormal   Collection Time: 03/10/22  9:49 AM  Result Value Ref Range   WBC 9.7 4.0 - 10.5 K/uL   RBC 4.84 4.22 - 5.81 MIL/uL   Hemoglobin 12.7 (L) 13.0 - 17.0 g/dL   HCT 40.8 39.0 - 52.0 %   MCV 84.3 80.0 -  100.0 fL   MCH 26.2 26.0 - 34.0 pg   MCHC 31.1 30.0 - 36.0 g/dL   RDW 15.5 11.5 - 15.5 %   Platelets 283 150 - 400 K/uL   nRBC 0.0 0.0 - 0.2 %   Neutrophils Relative % 75 %   Neutro Abs 7.2 1.7 - 7.7 K/uL   Lymphocytes Relative 15 %   Lymphs Abs 1.5 0.7 - 4.0 K/uL   Monocytes Relative 6 %   Monocytes Absolute 0.6 0.1 - 1.0 K/uL   Eosinophils Relative 3 %   Eosinophils Absolute 0.3 0.0 - 0.5 K/uL   Basophils Relative 0 %   Basophils Absolute 0.0 0.0 - 0.1 K/uL   Immature Granulocytes 1 %   Abs Immature Granulocytes 0.05 0.00 - 0.07 K/uL    Comment: Performed at [facility] [address], [place], [place] [address]  Comprehensive metabolic panel     Status: Abnormal   Collection Time: 03/10/22  9:49 AM  Result Value Ref Range   Sodium 138 135 - 145 mmol/L   Potassium 3.9 3.5 - 5.1 mmol/L   Chloride 104 98 - 111 mmol/L   CO2 20 (L) 22 - 32 mmol/L   Glucose, Bld 178 (H) 70 - 99 mg/dL    Comment: Glucose reference range applies only to samples taken after fasting for at least 8 hours.   BUN 34 (H) 8 - 23 mg/dL   Creatinine, Ser 1.50 (H) 0.61 - 1.24 mg/dL   Calcium 9.1 8.9 - 10.3 mg/dL   Total Protein 7.1 6.5 - 8.1 g/dL   Albumin 3.3 (L) 3.5 - 5.0 g/dL   AST 15 15 - 41 U/L   ALT 15 0 - 44 U/L   Alkaline Phosphatase 75 38 - 126 U/L   Total Bilirubin 0.8 0.3 - 1.2 mg/dL   GFR, Estimated 45 (L) >60 mL/min    Comment: (NOTE) Calculated using the CKD-EPI Creatinine Equation  (2021)    Anion gap 14 5 - 15    Comment: Performed at [facility] [address], [place], [place] [address]  Lipase, blood     Status: None   Collection Time: 03/10/22  9:49 AM  Result Value Ref Range   Lipase 36 11 - 51 U/L    Comment: Performed at [facility] [address], [place], [place] [address]  Troponin I (High Sensitivity)     Status: None   Collection Time: 03/10/22  9:49 AM  Result Value Ref Range   Troponin I (High Sensitivity) 10 <18 ng/L    Comment: (NOTE) Elevated high sensitivity troponin I (hsTnI) values and significant  changes across serial measurements may suggest ACS but many other  chronic and acute conditions are known to elevate hsTnI results.  Refer to the "Links" section for chest pain algorithms and additional  guidance. Performed at [facility], [place] [address], [place], [place] [address]   Troponin I (High Sensitivity)     Status: None   Collection Time: 03/10/22  1:37 PM  Result Value Ref Range   Troponin I (High Sensitivity) 9 <18 ng/L    Comment: (NOTE) Elevated high sensitivity troponin I (hsTnI) values and significant  changes across serial measurements may suggest ACS but many other  chronic and acute conditions are known to elevate hsTnI results.  Refer to the "Links" section for chest pain algorithms and additional  guidance. Performed at [facility], [place],  [place] [address]   CBG monitoring, ED     Status: Abnormal   Collection Time: 03/10/22  9:46 PM  Result Value Ref Range   Glucose-Capillary 140 (H) 70 - 99 mg/dL    Comment: Glucose reference range applies only to samples taken after fasting for at least 8 hours.  Glucose, capillary     Status: Abnormal   Collection Time: 03/11/22  1:39 AM  Result Value Ref Range   Glucose-Capillary 104 (H) 70 - 99 mg/dL    Comment: Glucose reference range applies only to samples taken after fasting for at least 8 hours.  Basic metabolic panel      Status: Abnormal   Collection Time: 03/11/22  3:24 AM  Result Value Ref Range   Sodium 137 135 - 145 mmol/L   Potassium 4.2 3.5 - 5.1 mmol/L   Chloride 103 98 - 111 mmol/L   CO2 26 22 - 32 mmol/L   Glucose, Bld 131 (H) 70 - 99 mg/dL    Comment: Glucose reference range applies only to samples taken after fasting for at least 8 hours.   BUN 32 (H) 8 - 23 mg/dL   Creatinine, Ser 1.62 (H) 0.61 - 1.24 mg/dL   Calcium 8.8 (L) 8.9 - 10.3 mg/dL   GFR, Estimated 41 (L) >60 mL/min    Comment: (NOTE) Calculated using the CKD-EPI Creatinine Equation (2021)    Anion gap 8 5 - 15    Comment: Performed at [facility] [address], [place], [place] [address]  CBC     Status: Abnormal   Collection Time: 03/11/22  3:24 AM  Result Value Ref Range   WBC 9.0 4.0 - 10.5 K/uL   RBC 4.63 4.22 - 5.81 MIL/uL   Hemoglobin 12.3 (L) 13.0 - 17.0 g/dL   HCT 38.3 (L) 39.0 - 52.0 %   MCV 82.7 80.0 - 100.0 fL   MCH 26.6 26.0 - 34.0 pg   MCHC 32.1 30.0 - 36.0 g/dL   RDW 15.8 (H) 11.5 - 15.5 %   Platelets 273 150 - 400 K/uL   nRBC 0.0 0.0 - 0.2 %    Comment: Performed at [facility], [place] [address] [place], [place] [address]   DG Chest 1 View  Result Date: 03/10/2022 CLINICAL DATA:  Left upper abdominal pain for months. Chest pain. Pain radiates to lower part of left ribs. EXAM: CHEST  1 VIEW COMPARISON:  Chest two views 07/10/2021 FINDINGS: Status post median sternotomy. Cardiac silhouette is within normal limits. Mild-to-moderate calcifications within the aortic arch, unchanged. Mildly tortuous thoracic aorta is unchanged. The lungs are again clear. No pleural effusion or pneumothorax. Mild multilevel degenerative disc changes of the thoracic spine. IMPRESSION: No active disease. Electronically Signed   By: Yvonne Kendall M.D.   On: 03/10/2022 10:14      Assessment/Plan Acute sigmoid diverticulitis with possible IM abscess noted on CT 03/04/22 Left sided abdominal pain - Patient seen and  examined.  Vitals, labs, imaging, I/L and available notes reviewed.  Patient is afebrile without tachycardia or hypotension.  WBC within normal limits. No peritonitis on exam. Repeat CT scan has resulted while I was in the room with the patient.  This showed diverticulosis without active inflammation.  He does have a notable amount of stool throughout the colon.  Can put on a bowel regimen.  No indication for surgical intervention.  He does not need any antibiotics from our standpoint.  Would recommend follow-up with GI.  Defer further work-up and disposition to primary.  We will sign off.  FEN - Diet per primary, IVF per primary VTE - SCD's, okay for chem ppx from our standpoint. Okay to resume plavix.  ID - Per primary  Admit - medical service  HTN CKD AAA (3.3 cm, infrarenal), last imaged 08/2021 COPD DM Anxiety/depression   Jillyn Ledger, [facility] Surgery 03/11/2022, 2:41 PM Please see Amion for pager number during day hours 7:00am-4:30pm or 7:00am -11:30am on weekends

## 2022-03-12 ENCOUNTER — Other Ambulatory Visit (HOSPITAL_COMMUNITY): Payer: Self-pay

## 2022-03-12 DIAGNOSIS — K5720 Diverticulitis of large intestine with perforation and abscess without bleeding: Secondary | ICD-10-CM | POA: Diagnosis not present

## 2022-03-12 LAB — BASIC METABOLIC PANEL
Anion gap: 9 (ref 5–15)
BUN: 36 mg/dL — ABNORMAL HIGH (ref 8–23)
CO2: 28 mmol/L (ref 22–32)
Calcium: 8.8 mg/dL — ABNORMAL LOW (ref 8.9–10.3)
Chloride: 98 mmol/L (ref 98–111)
Creatinine, Ser: 1.93 mg/dL — ABNORMAL HIGH (ref 0.61–1.24)
GFR, Estimated: 34 mL/min — ABNORMAL LOW (ref >60)
Glucose, Bld: 79 mg/dL (ref 70–99)
Potassium: 4.3 mmol/L (ref 3.5–5.1)
Sodium: 135 mmol/L (ref 135–145)

## 2022-03-12 LAB — CBC
HCT: 39.5 % (ref 39.0–52.0)
Hemoglobin: 12.3 g/dL — ABNORMAL LOW (ref 13.0–17.0)
MCH: 26.2 pg (ref 26.0–34.0)
MCHC: 31.1 g/dL (ref 30.0–36.0)
MCV: 84.2 fL (ref 80.0–100.0)
Platelets: 249 10*3/uL (ref 150–400)
RBC: 4.69 MIL/uL (ref 4.22–5.81)
RDW: 15.7 % — ABNORMAL HIGH (ref 11.5–15.5)
WBC: 8.1 10*3/uL (ref 4.0–10.5)
nRBC: 0 % (ref 0.0–0.2)

## 2022-03-12 MED ORDER — SIMETHICONE 80 MG PO CHEW: 80.0000 mg | CHEWABLE_TABLET | Freq: Four times a day (QID) | ORAL | 0 refills | Status: DC | PRN

## 2022-03-12 MED ORDER — SENNA 8.6 MG PO TABS
1.0000 | ORAL_TABLET | Freq: Every day | ORAL | Status: DC
  Administered 2022-03-12: 8.6 mg via ORAL
  Filled 2022-03-12: qty 1

## 2022-03-12 MED ORDER — LACTATED RINGERS IV BOLUS
500.0000 mL | Freq: Once | INTRAVENOUS | Status: AC
  Administered 2022-03-12: 500 mL via INTRAVENOUS

## 2022-03-12 MED ORDER — ACETAMINOPHEN 325 MG PO TABS
650.0000 mg | ORAL_TABLET | Freq: Four times a day (QID) | ORAL | 0 refills | Status: AC | PRN
  Filled 2022-03-12: qty 40, 5d supply, fill #0

## 2022-03-12 MED ORDER — PANTOPRAZOLE SODIUM 40 MG PO TBEC
40.0000 mg | DELAYED_RELEASE_TABLET | Freq: Every day | ORAL | Status: DC
  Administered 2022-03-12: 40 mg via ORAL
  Filled 2022-03-12: qty 1

## 2022-03-12 MED ORDER — SENNA 8.6 MG PO TABS: 1.0000 | ORAL_TABLET | Freq: Every day | ORAL | 0 refills | Status: DC

## 2022-03-12 MED ORDER — SIMETHICONE 80 MG PO CHEW: 80.0000 mg | CHEWABLE_TABLET | Freq: Four times a day (QID) | ORAL | Status: DC | PRN

## 2022-03-12 MED ORDER — CLOPIDOGREL BISULFATE 75 MG PO TABS
75.0000 mg | ORAL_TABLET | Freq: Every day | ORAL | Status: DC
  Administered 2022-03-12: 75 mg via ORAL
  Filled 2022-03-12: qty 1

## 2022-03-12 MED ORDER — ENOXAPARIN SODIUM 30 MG/0.3ML IJ SOSY: 30.0000 mg | PREFILLED_SYRINGE | INTRAMUSCULAR | Status: DC

## 2022-03-12 NOTE — Discharge Summary (Signed)
Name: Eddie Graham MRN: [id number] DOB: Oct 27, 1934 87 y.o. PCP: Kristopher Glee., MD  Date of Admission: 03/10/2022  9:21 AM Date of Discharge: 03/12/2022 Attending Physician: Charise Killian, MD  Discharge Diagnosis: 1. Principal Problem:   Diverticulitis of large intestine with abscess without bleeding  Discharge Medications: Allergies as of 03/12/2022   No Known Allergies      Medication List     STOP taking these medications    amoxicillin-clavulanate 875-125 MG tablet Commonly known as: AUGMENTIN       TAKE these medications    acetaminophen 500 MG tablet Commonly known as: TYLENOL Take 1,000 mg by mouth 2 (two) times daily as needed (pain).   albuterol 108 (90 Base) MCG/ACT inhaler Commonly known as: VENTOLIN HFA Inhale 2 puffs into the lungs every 4 (four) hours as needed for wheezing.   B-12 PO Take 1 capsule by mouth daily.   clopidogrel 75 MG tablet Commonly known as: PLAVIX Take 1 tablet (75 mg total) by mouth daily.   diclofenac Sodium 1 % Gel Commonly known as: VOLTAREN Apply 2 g topically 4 (four) times daily.   DULoxetine 60 MG capsule Commonly known as: CYMBALTA Take 60 mg by mouth daily.   EYE DROPS OP Place 2 drops into both eyes as needed (dryness/itching).   furosemide 40 MG tablet Commonly known as: LASIX Take 1 tablet (40 mg total) by mouth daily. What changed: when to take this   gabapentin 100 MG capsule Commonly known as: NEURONTIN Take 100 mg by mouth 3 (three) times daily.   glipiZIDE 10 MG tablet Commonly known as: GLUCOTROL Take 10 mg by mouth daily.   Lantus SoloStar 100 UNIT/ML Solostar Pen Generic drug: insulin glargine Inject 24 Units into the skin at bedtime.   lidocaine 5 % Commonly known as: Lidoderm Place 1 patch onto the skin daily. Remove & Discard patch within 12 hours or as directed by MD   metoprolol succinate 25 MG 24 hr tablet Commonly known as: TOPROL-XL Take 25 mg by mouth daily.    montelukast 10 MG tablet Commonly known as: SINGULAIR Take 10 mg by mouth daily.   pantoprazole 40 MG tablet Commonly known as: PROTONIX Take 40 mg by mouth daily.   pioglitazone 30 MG tablet Commonly known as: ACTOS Take 30 mg by mouth daily.   polyethylene glycol 17 g packet Commonly known as: MIRALAX / GLYCOLAX Take 17 g by mouth daily as needed for mild constipation.   rosuvastatin 40 MG tablet Commonly known as: CRESTOR Take 40 mg by mouth daily.   Semaglutide 14 MG Tabs Take 14 mg by mouth daily.   senna 8.6 MG Tabs tablet Commonly known as: SENOKOT Take 1 tablet (8.6 mg total) by mouth daily. Start taking on: March 13, 2022   simethicone 80 MG chewable tablet Commonly known as: MYLICON Chew 1 tablet (80 mg total) by mouth every 6 (six) hours as needed for flatulence (gas pain).   tamsulosin 0.4 MG Caps capsule Commonly known as: FLOMAX Take 0.4 mg by mouth daily.   traZODone 50 MG tablet Commonly known as: DESYREL Take 100 mg by mouth daily.   valsartan 80 MG tablet Commonly known as: DIOVAN Take 80 mg by mouth at bedtime.        Disposition and follow-up:   Eddie Graham was discharged from [facility] in Stable condition.  At the hospital follow up visit please address:  Diverticulitis: No signs of inflammation or abscess on repeat imaging.  Please assess for persistent abdominal pain.   Constipation: Patient to discharge with Miralax, senna, and simethicone given LLQ pain and heavy stool burden on CT. Please follow up to ensure appropriate amount of daily bowel movements and continued improvement of pain.  AKI: Follow up with BMP to ensure resolution / establish new baseline.  2.  Labs / imaging needed at time of follow-up: CMP  3.  Pending labs/ test needing follow-up: none  Follow-up Appointments: - PCP follow-up on 11/27  Hospital Course by problem list:  Diverticulitis: The patient presented to the ED  on 11/14 for several weeks of worsening left-sided abdominal pain, and CT results suggesting acute on chronic diverticulitis with possible chronic intramural abscess. He was hemodynamically stable on admission, and his exam demonstrated left sided abdominal tenderness with mild distension. His last bowel movement at that time was Tuesday morning, 11/14. In the ED, he had a normal CBC, CMP, serial troponins and CXR. He received one dose of IV Rocephin and Flagyl for presumed acute diverticulitis with possible abscess. Zofran for nausea, and Morphine once in the ED. On admission, the patient continued on IV Rocephin, Flagyl, and IV pain medication. Eventually transitioned to po oxycodone and IV Toradol prn. He had worsened abdominal pain on hospital day 2, so repeat CTAP w contrast was ordered for better characterization (and measurement) of previously reported abscess. General surgery was also consulted for further recommendations. CTAP showed diverticulosis throughout the colon without inflammation, with heavy stool burden. Patient's bowel regimen was augmented and abx discontinued. He had been receiving his home Miralax 17 g once daily, and had one normal bowel movement on hospital day 2. His Miralax was increased to twice daily, plus Senna 8.6 mg on hospital day 3, and he two large bowel movements before discharge. Transitioned to oral Tylenol PRN. By discharge, his abdominal pain had improved. He will follow-up with PCP 11/27. He will schedule follow up with gastroenterology.   Discharge Subjective: The patient reports his left-sided abdominal pain has improved. He had a normal bowel movement yesterday, and a large bowel movement today. He has been able to eat and drink without difficulty. He was able to walk the halls with physical therapy with no shortness of breath this morning.   Discharge Exam:   BP 128/68 (BP Location: Right Arm)   Pulse 71   Temp 98.2 F (36.8 C) (Oral)   Resp 17   Wt 85 kg    SpO2 96%   BMI 30.25 kg/m  Discharge exam:   General: Appears to be sitting comfortably in chair; No acute distress Cardiovascular: RRR; no murmurs, rubs, or gallops; 2+ radial pulses Pulmonary: Normal respiratory effort; Lungs clear to auscultation bilaterally; No wheezes, rubs, or rales Abdomen: Diffuse mild tenderness to palpation, worse in LLQ; mild distension; tympanic to percussion; no masses or organomegaly  Extremities: No lower extremity edema     Pertinent Labs, Studies, and Procedures:  CT ABDOMEN PELVIS W CONTRAST  Result Date: 03/11/2022 CLINICAL DATA:  Possible diverticulitis. EXAM: CT ABDOMEN AND PELVIS WITH CONTRAST TECHNIQUE: Multidetector CT imaging of the abdomen and pelvis was performed using the standard protocol following bolus administration of intravenous contrast. RADIATION DOSE REDUCTION: This exam was performed according to the departmental dose-optimization program which includes automated exposure control, adjustment of the mA and/or kV according to patient size and/or use of iterative reconstruction technique. CONTRAST:  73m OMNIPAQUE IOHEXOL 350 MG/ML SOLN COMPARISON:  January 24, 2022 FINDINGS: Lower chest: No acute abnormality. Hepatobiliary: No focal  liver abnormality is seen. No gallstones, gallbladder wall thickening, or biliary dilatation. Pancreas: Unremarkable. No pancreatic ductal dilatation or surrounding inflammatory changes. Spleen: Normal in size without focal abnormality. Adrenals/Urinary Tract: Adrenal glands appear normal. Stable right renal cyst is noted for which no further follow-up is required. No hydronephrosis or renal obstruction is noted. No renal or ureteral calculi are noted. Urinary bladder is unremarkable. Stomach/Bowel: Stomach is unremarkable. There is no evidence of bowel obstruction or inflammation. The appendix appears normal. Diverticulosis is noted throughout the colon without inflammation. Stool is also noted throughout the colon.  Vascular/Lymphatic: Aortic atherosclerosis. No enlarged abdominal or pelvic lymph nodes. 3.2 cm saccular aneurysm is seen involving the distal portion of infrarenal abdominal aorta. Reproductive: Prostate is unremarkable. Other: Small fat containing right inguinal hernia is noted. No ascites is noted. Musculoskeletal: No acute or significant osseous findings. IMPRESSION: Diverticulosis is noted throughout the colon without inflammation. Stool is also noted throughout the colon. Small fat containing right inguinal hernia. 3.2 cm infrarenal abdominal aortic aneurysm. Recommend follow-up ultrasound every 3 years. This recommendation follows ACR consensus guidelines: Roneka Gilpin Paper of the ACR Incidental Findings Committee II on Vascular Findings. J Am Coll Radiol 2013; 10:789-794. Aortic Atherosclerosis (ICD10-I70.0). Electronically Signed   By: Marijo Conception M.D.   On: 03/11/2022 15:21   DG Chest 1 View  Result Date: 03/10/2022 CLINICAL DATA:  Left upper abdominal pain for months. Chest pain. Pain radiates to lower part of left ribs. EXAM: CHEST  1 VIEW COMPARISON:  Chest two views 07/10/2021 FINDINGS: Status post median sternotomy. Cardiac silhouette is within normal limits. Mild-to-moderate calcifications within the aortic arch, unchanged. Mildly tortuous thoracic aorta is unchanged. The lungs are again clear. No pleural effusion or pneumothorax. Mild multilevel degenerative disc changes of the thoracic spine. IMPRESSION: No active disease. Electronically Signed   By: Yvonne Kendall M.D.   On: 03/10/2022 10:14       Latest Ref Rng & Units 03/12/2022    2:46 AM 03/11/2022    3:24 AM 03/10/2022    9:49 AM  CBC  WBC 4.0 - 10.5 K/uL 8.1  9.0  9.7   Hemoglobin 13.0 - 17.0 g/dL 12.3  12.3  12.7   Hematocrit 39.0 - 52.0 % 39.5  38.3  40.8   Platelets 150 - 400 K/uL 249  273  283       Latest Ref Rng & Units 03/12/2022    2:46 AM 03/11/2022    3:24 AM 03/10/2022    9:49 AM  CMP  Glucose 70 - 99 mg/dL 79   131  178   BUN 8 - 23 mg/dL 36  32  34   Creatinine 0.61 - 1.24 mg/dL 1.93  1.62  1.50   Sodium 135 - 145 mmol/L 135  137  138   Potassium 3.5 - 5.1 mmol/L 4.3  4.2  3.9   Chloride 98 - 111 mmol/L 98  103  104   CO2 22 - 32 mmol/L '28  26  20   '$ Calcium 8.9 - 10.3 mg/dL 8.8  8.8  9.1   Total Protein 6.5 - 8.1 g/dL   7.1   Total Bilirubin 0.3 - 1.2 mg/dL   0.8   Alkaline Phos 38 - 126 U/L   75   AST 15 - 41 U/L   15   ALT 0 - 44 U/L   15      Discharge Instructions: Discharge Instructions     Diet - low sodium heart  healthy   Complete by: As directed    Discharge instructions   Complete by: As directed    Mr. Graham,  It was a pleasure caring for you during your stay here at [facility]. You came in with abdominal pain and were treated for diverticulitis. You are also likely constipated and are now having more bowel movements with your bowel regimen. Please note the following items. -Follow up with you primary care provider, Dr. Karle Starch, on November 27.  -Schedule a follow up appointment with your gastroenterologist at [facility] [phone] ) -Please take senokot 1 tablet daily -Please take simethicone 1 tablet every six hours as needed for gas pain -Please take Miralax, one packet daily as needed for constipation -stop taking augmentin   Increase activity slowly   Complete by: As directed        Signed: Linward Natal, MD 03/12/2022, 3:56 PM   Pager: [phone]

## 2022-03-12 NOTE — Progress Notes (Signed)
OT Cancellation Note  Patient Details Name: Eddie Graham MRN: [id number] DOB: February 20, 1935   Cancelled Treatment:    Reason Eval/Treat Not Completed: Patient at procedure or test/ unavailable.  OT to continue efforts.  Majour Frei D Shahir Karen 03/12/2022, 3:26 PM 03/12/2022  RP, OTR/L  Acute Rehabilitation Services  Office:  [phone]

## 2022-03-12 NOTE — Plan of Care (Signed)

## 2022-03-12 NOTE — Progress Notes (Signed)
Mobility Specialist - Progress Note   03/12/22 1559  Mobility  Activity Ambulated independently in hallway  Level of Assistance Modified independent, requires aide device or extra time  Assistive Device Cane  Distance Ambulated (ft) 900 ft  Activity Response Tolerated well  $Mobility charge 1 Mobility    Pt received in bed agreeable to mobility. Left in room w/ all needs met and family present.   [place] Specialist Please contact via SecureChat or Rehab office at [phone]

## 2022-03-12 NOTE — Evaluation (Signed)
Physical Therapy Evaluation and Discharge Patient Details Name: Eddie Graham MRN: [id number] DOB: 1934-10-06 Today's Date: 03/12/2022  History of Present Illness  Pt is 87 yo male who presents with sigmoid diverticulitis with possible intramural abscess on CT scan. PMH: CAD, COPD, HTN, AAA, DM, neuropathy, CKD, depression, back pain  Clinical Impression  Patient evaluated by Physical Therapy with no further acute PT needs identified. All education has been completed and the patient has no further questions. Pt from home in an apt alone, daughter lives in [place] and arranges for transportation for pt when he has appts. Spoke on the phone with a friend locally who checks on him. Pt up independently in room. Ambulates independently in hallway with SPC used primarily as a precautions. No acute PT needs but will have mobility team follow for frequent ambulation.  See below for any follow-up Physical Therapy or equipment needs. PT is signing off. Thank you for this referral.        Recommendations for follow up therapy are one component of a multi-disciplinary discharge planning process, led by the attending physician.  Recommendations may be updated based on patient status, additional functional criteria and insurance authorization.  Follow Up Recommendations No PT follow up      Assistance Recommended at Discharge PRN  Patient can return home with the following  Assist for transportation;Assistance with cooking/housework    Equipment Recommendations None recommended by PT  Recommendations for Other Services       Functional Status Assessment Patient has not had a recent decline in their functional status     Precautions / Restrictions Precautions Precautions: None Restrictions Weight Bearing Restrictions: No      Mobility  Bed Mobility Overal bed mobility: Independent                  Transfers Overall transfer level: Independent Equipment used: None                     Ambulation/Gait Ambulation/Gait assistance: Modified independent (Device/Increase time) Gait Distance (Feet): 250 Feet Assistive device: Straight cane Gait Pattern/deviations: Step-through pattern, Antalgic Gait velocity: WFL Gait velocity interpretation: >4.37 ft/sec, indicative of normal walking speed   General Gait Details: slightly antalgic pattern due to L abdominal pain. Pt occasionally taps cane to floor, uses more as a precaution  Stairs            Wheelchair Mobility    Modified Rankin (Stroke Patients Only)       Balance Overall balance assessment: Mild deficits observed, not formally tested                                           Pertinent Vitals/Pain Pain Assessment Pain Assessment: 0-10 Pain Score: 10-Worst pain ever Pain Location: L abdomen Pain Descriptors / Indicators: Constant Pain Intervention(s): Limited activity within patient's tolerance, Monitored during session    Home Living Family/patient expects to be discharged to:: Private residence Living Arrangements: Alone Available Help at Discharge: Friend(s);Available PRN/intermittently Type of Home: Apartment Home Access: Level entry       Home Layout: One level Home Equipment: Kasandra Knudsen - single point Additional Comments: daughter lives in [place], arranges for rides when pt has appts. Spoke with his friend in [place] who keeps an eye on him.    Prior Function Prior Level of Function : Independent/Modified Independent  Hand Dominance        Extremity/Trunk Assessment   Upper Extremity Assessment Upper Extremity Assessment: Overall WFL for tasks assessed    Lower Extremity Assessment Lower Extremity Assessment: Overall WFL for tasks assessed    Cervical / Trunk Assessment Cervical / Trunk Assessment: Normal  Communication   Communication: Prefers language other than English (Spanish but [place] good at basic level)  Cognition  Arousal/Alertness: Awake/alert Behavior During Therapy: WFL for tasks assessed/performed Overall Cognitive Status: Within Functional Limits for tasks assessed                                          General Comments General comments (skin integrity, edema, etc.): VSS though pt with intermittent increased RR with pain    Exercises     Assessment/Plan    PT Assessment Patient does not need any further PT services  PT Problem List         PT Treatment Interventions      PT Goals (Current goals can be found in the Care Plan section)  Acute Rehab PT Goals Patient Stated Goal: abdominal pain to stop PT Goal Formulation: All assessment and education complete, DC therapy    Frequency       Co-evaluation               AM-PAC PT "6 Clicks" Mobility  Outcome Measure Help needed turning from your back to your side while in a flat bed without using bedrails?: None Help needed moving from lying on your back to sitting on the side of a flat bed without using bedrails?: None Help needed moving to and from a bed to a chair (including a wheelchair)?: None Help needed standing up from a chair using your arms (e.g., wheelchair or bedside chair)?: None Help needed to walk in hospital room?: None Help needed climbing 3-5 steps with a railing? : None 6 Click Score: 24    End of Session   Activity Tolerance: Patient tolerated treatment well Patient left: in chair;with call bell/phone within reach Nurse Communication: Mobility status PT Visit Diagnosis: Pain Pain - part of body:  (abdomen)    Time: 6948-5462 PT Time Calculation (min) (ACUTE ONLY): 14 min   Charges:   PT Evaluation $PT Eval Low Complexity: 1 Low          Leighton Roach, PT  Acute Rehab Services Secure chat preferred Office [place] 03/12/2022, 9:53 AM

## 2022-05-14 ENCOUNTER — Encounter: Payer: Medicare Other | Admitting: Dietician

## 2022-06-12 ENCOUNTER — Encounter (HOSPITAL_COMMUNITY): Payer: Self-pay | Admitting: Internal Medicine

## 2022-06-12 ENCOUNTER — Inpatient Hospital Stay (HOSPITAL_COMMUNITY)
Admission: EM | Admit: 2022-06-12 | Discharge: 2022-06-15 | DRG: 378 | Disposition: A | Discharge status: Home / Self Care | Payer: 59 | Attending: Family Medicine | Admitting: Family Medicine

## 2022-06-12 ENCOUNTER — Emergency Department (HOSPITAL_COMMUNITY): Payer: 59

## 2022-06-12 ENCOUNTER — Other Ambulatory Visit: Payer: Self-pay

## 2022-06-12 DIAGNOSIS — E785 Hyperlipidemia, unspecified: Secondary | ICD-10-CM | POA: Diagnosis present

## 2022-06-12 DIAGNOSIS — K5733 Diverticulitis of large intestine without perforation or abscess with bleeding: Secondary | ICD-10-CM | POA: Diagnosis not present

## 2022-06-12 DIAGNOSIS — Z951 Presence of aortocoronary bypass graft: Secondary | ICD-10-CM

## 2022-06-12 DIAGNOSIS — E118 Type 2 diabetes mellitus with unspecified complications: Secondary | ICD-10-CM | POA: Diagnosis not present

## 2022-06-12 DIAGNOSIS — K5792 Diverticulitis of intestine, part unspecified, without perforation or abscess without bleeding: Secondary | ICD-10-CM | POA: Insufficient documentation

## 2022-06-12 DIAGNOSIS — E1142 Type 2 diabetes mellitus with diabetic polyneuropathy: Secondary | ICD-10-CM | POA: Diagnosis present

## 2022-06-12 DIAGNOSIS — Z603 Acculturation difficulty: Secondary | ICD-10-CM | POA: Diagnosis present

## 2022-06-12 DIAGNOSIS — R710 Precipitous drop in hematocrit: Secondary | ICD-10-CM | POA: Diagnosis present

## 2022-06-12 DIAGNOSIS — K5790 Diverticulosis of intestine, part unspecified, without perforation or abscess without bleeding: Secondary | ICD-10-CM

## 2022-06-12 DIAGNOSIS — E1122 Type 2 diabetes mellitus with diabetic chronic kidney disease: Secondary | ICD-10-CM | POA: Diagnosis present

## 2022-06-12 DIAGNOSIS — Z933 Colostomy status: Secondary | ICD-10-CM

## 2022-06-12 DIAGNOSIS — N183 Chronic kidney disease, stage 3 unspecified: Secondary | ICD-10-CM | POA: Insufficient documentation

## 2022-06-12 DIAGNOSIS — K922 Gastrointestinal hemorrhage, unspecified: Secondary | ICD-10-CM | POA: Diagnosis present

## 2022-06-12 DIAGNOSIS — Z8550 Personal history of malignant neoplasm of unspecified urinary tract organ: Secondary | ICD-10-CM

## 2022-06-12 DIAGNOSIS — E1140 Type 2 diabetes mellitus with diabetic neuropathy, unspecified: Secondary | ICD-10-CM | POA: Diagnosis present

## 2022-06-12 DIAGNOSIS — K921 Melena: Secondary | ICD-10-CM | POA: Diagnosis not present

## 2022-06-12 DIAGNOSIS — Z794 Long term (current) use of insulin: Secondary | ICD-10-CM

## 2022-06-12 DIAGNOSIS — I7143 Infrarenal abdominal aortic aneurysm, without rupture: Secondary | ICD-10-CM | POA: Diagnosis present

## 2022-06-12 DIAGNOSIS — I714 Abdominal aortic aneurysm, without rupture, unspecified: Secondary | ICD-10-CM | POA: Insufficient documentation

## 2022-06-12 DIAGNOSIS — I252 Old myocardial infarction: Secondary | ICD-10-CM

## 2022-06-12 DIAGNOSIS — J4489 Other specified chronic obstructive pulmonary disease: Secondary | ICD-10-CM | POA: Diagnosis present

## 2022-06-12 DIAGNOSIS — I2510 Atherosclerotic heart disease of native coronary artery without angina pectoris: Secondary | ICD-10-CM | POA: Insufficient documentation

## 2022-06-12 DIAGNOSIS — Z7982 Long term (current) use of aspirin: Secondary | ICD-10-CM

## 2022-06-12 DIAGNOSIS — F32A Depression, unspecified: Secondary | ICD-10-CM | POA: Diagnosis present

## 2022-06-12 DIAGNOSIS — Z9049 Acquired absence of other specified parts of digestive tract: Secondary | ICD-10-CM

## 2022-06-12 DIAGNOSIS — Z79899 Other long term (current) drug therapy: Secondary | ICD-10-CM

## 2022-06-12 DIAGNOSIS — Z7902 Long term (current) use of antithrombotics/antiplatelets: Secondary | ICD-10-CM

## 2022-06-12 DIAGNOSIS — Z7984 Long term (current) use of oral hypoglycemic drugs: Secondary | ICD-10-CM

## 2022-06-12 DIAGNOSIS — I129 Hypertensive chronic kidney disease with stage 1 through stage 4 chronic kidney disease, or unspecified chronic kidney disease: Secondary | ICD-10-CM | POA: Diagnosis present

## 2022-06-12 DIAGNOSIS — N329 Bladder disorder, unspecified: Secondary | ICD-10-CM | POA: Insufficient documentation

## 2022-06-12 LAB — URINALYSIS, ROUTINE W REFLEX MICROSCOPIC
Bacteria, UA: NONE SEEN
Bilirubin Urine: NEGATIVE
Glucose, UA: NEGATIVE mg/dL
Ketones, ur: NEGATIVE mg/dL
Leukocytes,Ua: NEGATIVE
Nitrite: NEGATIVE
Protein, ur: NEGATIVE mg/dL
Specific Gravity, Urine: 1.004 — ABNORMAL LOW (ref 1.005–1.030)
pH: 6 (ref 5.0–8.0)

## 2022-06-12 LAB — COMPREHENSIVE METABOLIC PANEL
ALT: 13 U/L (ref 0–44)
AST: 15 U/L (ref 15–41)
Albumin: 2.9 g/dL — ABNORMAL LOW (ref 3.5–5.0)
Alkaline Phosphatase: 74 U/L (ref 38–126)
Anion gap: 8 (ref 5–15)
BUN: 32 mg/dL — ABNORMAL HIGH (ref 8–23)
CO2: 27 mmol/L (ref 22–32)
Calcium: 8.6 mg/dL — ABNORMAL LOW (ref 8.9–10.3)
Chloride: 100 mmol/L (ref 98–111)
Creatinine, Ser: 1.57 mg/dL — ABNORMAL HIGH (ref 0.61–1.24)
GFR, Estimated: 43 mL/min — ABNORMAL LOW (ref >60)
Glucose, Bld: 142 mg/dL — ABNORMAL HIGH (ref 70–99)
Potassium: 3.4 mmol/L — ABNORMAL LOW (ref 3.5–5.1)
Sodium: 135 mmol/L (ref 135–145)
Total Bilirubin: 0.5 mg/dL (ref 0.3–1.2)
Total Protein: 6.7 g/dL (ref 6.5–8.1)

## 2022-06-12 LAB — CBC WITH DIFFERENTIAL/PLATELET
Abs Immature Granulocytes: 0.05 10*3/uL (ref 0.00–0.07)
Basophils Absolute: 0 10*3/uL (ref 0.0–0.1)
Basophils Relative: 0 %
Eosinophils Absolute: 0.2 10*3/uL (ref 0.0–0.5)
Eosinophils Relative: 2 %
HCT: 36.8 % — ABNORMAL LOW (ref 39.0–52.0)
Hemoglobin: 11.1 g/dL — ABNORMAL LOW (ref 13.0–17.0)
Immature Granulocytes: 1 %
Lymphocytes Relative: 25 %
Lymphs Abs: 2.3 10*3/uL (ref 0.7–4.0)
MCH: 24.3 pg — ABNORMAL LOW (ref 26.0–34.0)
MCHC: 30.2 g/dL (ref 30.0–36.0)
MCV: 80.5 fL (ref 80.0–100.0)
Monocytes Absolute: 0.5 10*3/uL (ref 0.1–1.0)
Monocytes Relative: 6 %
Neutro Abs: 6.1 10*3/uL (ref 1.7–7.7)
Neutrophils Relative %: 66 %
Platelets: 429 10*3/uL — ABNORMAL HIGH (ref 150–400)
RBC: 4.57 MIL/uL (ref 4.22–5.81)
RDW: 15.8 % — ABNORMAL HIGH (ref 11.5–15.5)
WBC: 9.1 10*3/uL (ref 4.0–10.5)
nRBC: 0 % (ref 0.0–0.2)

## 2022-06-12 LAB — I-STAT CHEM 8, ED
BUN: 31 mg/dL — ABNORMAL HIGH (ref 8–23)
Calcium, Ion: 1.15 mmol/L (ref 1.15–1.40)
Chloride: 98 mmol/L (ref 98–111)
Creatinine, Ser: 1.6 mg/dL — ABNORMAL HIGH (ref 0.61–1.24)
Glucose, Bld: 141 mg/dL — ABNORMAL HIGH (ref 70–99)
HCT: 36 % — ABNORMAL LOW (ref 39.0–52.0)
Hemoglobin: 12.2 g/dL — ABNORMAL LOW (ref 13.0–17.0)
Potassium: 3.6 mmol/L (ref 3.5–5.1)
Sodium: 137 mmol/L (ref 135–145)
TCO2: 28 mmol/L (ref 22–32)

## 2022-06-12 LAB — LACTIC ACID, PLASMA: Lactic Acid, Venous: 0.9 mmol/L (ref 0.5–1.9)

## 2022-06-12 LAB — TYPE AND SCREEN
ABO/RH(D): A POS
Antibody Screen: NEGATIVE

## 2022-06-12 LAB — LIPASE, BLOOD: Lipase: 38 U/L (ref 11–51)

## 2022-06-12 LAB — CBG MONITORING, ED: Glucose-Capillary: 86 mg/dL (ref 70–99)

## 2022-06-12 MED ORDER — LABETALOL HCL 5 MG/ML IV SOLN
10.0000 mg | INTRAVENOUS | Status: DC | PRN
  Filled 2022-06-12: qty 4

## 2022-06-12 MED ORDER — MORPHINE SULFATE (PF) 4 MG/ML IV SOLN
4.0000 mg | Freq: Once | INTRAVENOUS | Status: AC
  Administered 2022-06-12: 4 mg via INTRAVENOUS
  Filled 2022-06-12: qty 1

## 2022-06-12 MED ORDER — SODIUM CHLORIDE 0.9 % IV SOLN: INTRAVENOUS | Status: AC

## 2022-06-12 MED ORDER — ALBUTEROL SULFATE HFA 108 (90 BASE) MCG/ACT IN AERS: 2.0000 | INHALATION_SPRAY | RESPIRATORY_TRACT | Status: DC | PRN

## 2022-06-12 MED ORDER — SODIUM CHLORIDE 0.9 % IV BOLUS
1000.0000 mL | Freq: Once | INTRAVENOUS | Status: AC
  Administered 2022-06-12: 1000 mL via INTRAVENOUS

## 2022-06-12 MED ORDER — METOPROLOL SUCCINATE ER 50 MG PO TB24
25.0000 mg | ORAL_TABLET | Freq: Every day | ORAL | Status: DC
  Administered 2022-06-13 – 2022-06-15 (×3): 25 mg via ORAL
  Filled 2022-06-12 (×3): qty 1

## 2022-06-12 MED ORDER — IOHEXOL 350 MG/ML SOLN
80.0000 mL | Freq: Once | INTRAVENOUS | Status: AC | PRN
  Administered 2022-06-12: 80 mL via INTRAVENOUS

## 2022-06-12 MED ORDER — INSULIN ASPART 100 UNIT/ML IJ SOLN
0.0000 [IU] | INTRAMUSCULAR | Status: DC
  Filled 2022-06-12: qty 0.09

## 2022-06-12 MED ORDER — PIPERACILLIN-TAZOBACTAM 3.375 G IVPB 30 MIN
3.3750 g | Freq: Once | INTRAVENOUS | Status: AC
  Administered 2022-06-12: 3.375 g via INTRAVENOUS
  Filled 2022-06-12: qty 50

## 2022-06-12 MED ORDER — PANTOPRAZOLE SODIUM 40 MG IV SOLR
40.0000 mg | INTRAVENOUS | Status: DC
  Administered 2022-06-13 – 2022-06-14 (×3): 40 mg via INTRAVENOUS
  Filled 2022-06-12 (×3): qty 10

## 2022-06-12 MED ORDER — ALBUTEROL SULFATE (2.5 MG/3ML) 0.083% IN NEBU: 2.5000 mg | INHALATION_SOLUTION | RESPIRATORY_TRACT | Status: DC | PRN

## 2022-06-12 MED ORDER — PIPERACILLIN-TAZOBACTAM 3.375 G IVPB
3.3750 g | Freq: Three times a day (TID) | INTRAVENOUS | Status: DC
  Administered 2022-06-13 – 2022-06-15 (×7): 3.375 g via INTRAVENOUS
  Filled 2022-06-12 (×6): qty 50

## 2022-06-12 MED ORDER — FENTANYL CITRATE PF 50 MCG/ML IJ SOSY
25.0000 ug | PREFILLED_SYRINGE | INTRAMUSCULAR | Status: DC | PRN
  Administered 2022-06-13 – 2022-06-14 (×3): 25 ug via INTRAVENOUS
  Filled 2022-06-12 (×3): qty 1

## 2022-06-12 NOTE — Progress Notes (Signed)
Pharmacy Antibiotic Note  Izaia Aronson is a 88 y.o. male admitted on 06/12/2022 with intra-abdominal infection.  Pharmacy has been consulted for Zosyn dosing.  Plan: Zosyn 3.375g IV q8h (4 hour infusion). Need for further dosage adjustment appears unlikely at present.    Will sign off at this time.  Please reconsult if a change in clinical status warrants re-evaluation of dosage.    Height: 5' 6"$  (167.6 cm) Weight: 85 kg (187 lb 6.3 oz) IBW/kg (Calculated) : 63.8  Temp (24hrs), Avg:97.6 F (36.4 C), Min:97.5 F (36.4 C), Max:97.7 F (36.5 C)  Recent Labs  Lab 06/12/22 1555 06/12/22 1639 06/12/22 2020  WBC 9.1  --   --   CREATININE 1.57* 1.60*  --   LATICACIDVEN  --   --  0.9    Estimated Creatinine Clearance: 34.5 mL/min (A) (by C-G formula based on SCr of 1.6 mg/dL (H)).    No Known Allergies    Thank you for allowing pharmacy to be a part of this patient's care.  Everette Rank, PharmD 06/12/2022 10:46 PM

## 2022-06-12 NOTE — ED Provider Notes (Signed)
[facility] Provider Note   CSN: [id number] Arrival date & time: 06/12/22  1354     History  Chief Complaint  Patient presents with   Abdominal Pain    Freeman Zesati is a 88 y.o. male history of diabetes, hypertension, here presenting with abdominal pain.  Patient had perforated diverticulitis that required an bowel resection and ostomy about 2 weeks ago.  Patient noticed bright red blood in the ostomy today.  Patient is not currently on blood thinners.  Patient has diffuse abdominal pain as well.  The history is provided by the patient. A language interpreter was used.       Home Medications Prior to Admission medications   Medication Sig Start Date End Date Taking? Authorizing Provider  albuterol (VENTOLIN HFA) 108 (90 Base) MCG/ACT inhaler Inhale 2 puffs into the lungs every 4 (four) hours as needed for wheezing. 11/11/20   [provider]  Carboxymethylcellulose Sodium (EYE DROPS OP) Place 2 drops into both eyes as needed (dryness/itching).    [provider]  clopidogrel (PLAVIX) 75 MG tablet Take 1 tablet (75 mg total) by mouth daily. 12/23/21   Geradine Girt, DO  Cyanocobalamin (B-12 PO) Take 1 capsule by mouth daily.    [provider]  diclofenac Sodium (VOLTAREN) 1 % GEL Apply 2 g topically 4 (four) times daily. 12/20/21   Geradine Girt, DO  DULoxetine (CYMBALTA) 60 MG capsule Take 60 mg by mouth daily. 02/07/21   [provider]  furosemide (LASIX) 40 MG tablet Take 1 tablet (40 mg total) by mouth daily. Patient taking differently: Take 40 mg by mouth 2 (two) times daily. 12/16/21   Geradine Girt, DO  gabapentin (NEURONTIN) 100 MG capsule Take 100 mg by mouth 3 (three) times daily. 02/25/21   [provider]  glipiZIDE (GLUCOTROL) 10 MG tablet Take 10 mg by mouth daily. 03/25/21   [provider]  LANTUS SOLOSTAR 100 UNIT/ML Solostar Pen Inject 24 Units into  the skin at bedtime. 09/07/21   [provider]  lidocaine (LIDODERM) 5 % Place 1 patch onto the skin daily. Remove & Discard patch within 12 hours or as directed by MD 11/01/21   Blue, Soijett A, PA-C  metoprolol succinate (TOPROL-XL) 25 MG 24 hr tablet Take 25 mg by mouth daily. 04/07/21   [provider]  montelukast (SINGULAIR) 10 MG tablet Take 10 mg by mouth daily. 03/19/21   [provider]  pantoprazole (PROTONIX) 40 MG tablet Take 40 mg by mouth daily. 02/14/21   [provider]  pioglitazone (ACTOS) 30 MG tablet Take 30 mg by mouth daily. 12/22/21   [provider]  polyethylene glycol (MIRALAX / GLYCOLAX) 17 g packet Take 17 g by mouth daily as needed for mild constipation. 12/20/21   Geradine Girt, DO  rosuvastatin (CRESTOR) 40 MG tablet Take 40 mg by mouth daily. 04/07/21   [provider]  Semaglutide 14 MG TABS Take 14 mg by mouth daily. 03/14/21   [provider]  senna (SENOKOT) 8.6 MG TABS tablet Take 1 tablet (8.6 mg total) by mouth daily. 03/13/22   Iona Beard, MD  simethicone (MYLICON) 80 MG chewable tablet Chew 1 tablet (80 mg total) by mouth every 6 (six) hours as needed for flatulence (gas pain). 03/12/22   Iona Beard, MD  tamsulosin (FLOMAX) 0.4 MG CAPS capsule Take 0.4 mg by mouth daily. Patient not taking: Reported on 12/05/2021 11/23/21  [provider]  traZODone (DESYREL) 50 MG tablet Take 100 mg by mouth daily. 03/14/21   [provider]  valsartan (DIOVAN) 80 MG tablet Take 80 mg by mouth at bedtime. 02/05/22   [provider]      Allergies    Patient has no known allergies.    Review of Systems   Review of Systems  Gastrointestinal:  Positive for abdominal pain and blood in stool.  All other systems reviewed and are negative.   Physical Exam Updated Vital Signs BP (!) 140/63 (BP Location: Right Arm)   Pulse 69   Temp (!) 97.5 F (36.4 C) (Oral)   Resp 16    Ht 5' 6"$  (1.676 m)   Wt 85 kg   SpO2 98%   BMI 30.25 kg/m  Physical Exam Vitals and nursing note reviewed.  Constitutional:      Comments: Chronically ill appearing  HENT:     Head: Normocephalic.  Eyes:     Extraocular Movements: Extraocular movements intact.     Pupils: Pupils are equal, round, and reactive to light.  Cardiovascular:     Rate and Rhythm: Normal rate and regular rhythm.  Abdominal:     Comments: Distended, + bright red blood through the ostomy  Skin:    General: Skin is warm.     Capillary Refill: Capillary refill takes less than 2 seconds.  Neurological:     General: No focal deficit present.     Mental Status: He is oriented to person, place, and time.  Psychiatric:        Mood and Affect: Mood normal.        Behavior: Behavior normal.     ED Results / Procedures / Treatments   Labs (all labs ordered are listed, but only abnormal results are displayed) Labs Reviewed  CBC WITH DIFFERENTIAL/PLATELET - Abnormal; Notable for the following components:      Result Value   Hemoglobin 11.1 (*)    HCT 36.8 (*)    MCH 24.3 (*)    RDW 15.8 (*)    Platelets 429 (*)    All other components within normal limits  I-STAT CHEM 8, ED - Abnormal; Notable for the following components:   BUN 31 (*)    Creatinine, Ser 1.60 (*)    Glucose, Bld 141 (*)    Hemoglobin 12.2 (*)    HCT 36.0 (*)    All other components within normal limits  COMPREHENSIVE METABOLIC PANEL  LIPASE, BLOOD  URINALYSIS, ROUTINE W REFLEX MICROSCOPIC  TYPE AND SCREEN    EKG None  Radiology No results found.  Procedures Procedures    Angiocath insertion Performed by: Wandra Arthurs  Consent: Verbal consent obtained. Risks and benefits: risks, benefits and alternatives were discussed Time out: Immediately prior to procedure a "time out" was called to verify the correct patient, procedure, equipment, support staff and site/side marked as required.  Preparation: Patient was prepped and  draped in the usual sterile fashion.  Vein Location: R antecube  Ultrasound Guided  Gauge: 20 long  Normal blood return and flush without difficulty Patient tolerance: Patient tolerated the procedure well with no immediate complications.    Medications Ordered in ED Medications  sodium chloride 0.9 % bolus 1,000 mL (1,000 mLs Intravenous New Bag/Given 06/12/22 1629)  morphine (PF) 4 MG/ML injection 4 mg (4 mg Intravenous Given 06/12/22 1624)    ED Course/ Medical Decision Making/ A&P  Medical Decision Making Spyridon Kenyon is a 88 y.o. male here presenting with abdominal pain and bright red blood in the ostomy.  Patient just had a perforated diverticulitis that required bowel resection and ostomy.  Patient has bright red blood in the ostomy.  Will get a CTA to see if we can localize the bleeding.  Concern for possible diverticular bleed.  Will get CBC and CMP and CTA.  7:35 PM CTA did not show any active extravasation.  Patient has persistent diverticulitis.  I ordered Zosyn empirically.  His white count is normal.  I offered transfer back to [facility].  He states that he does not want to be transferred back there.  I think he likely is bleeding from his diverticulosis.  I discussed case with GI doctor, Dr. Alessandra Bevels, who states that patient can have clear liquid diet and he will see patient as a consult tomorrow.  Hospitalist to admit  Problems Addressed: Diverticulitis: acute illness or injury Diverticulosis: acute illness or injury  Amount and/or Complexity of Data Reviewed Labs: ordered. Decision-making details documented in ED Course. Radiology: ordered and independent interpretation performed. Decision-making details documented in ED Course.  Risk Prescription drug management. Decision regarding hospitalization.    Final Clinical Impression(s) / ED Diagnoses Final diagnoses:  None    Rx / DC Orders ED Discharge  Orders     None         Drenda Freeze, MD 06/12/22 [phone]

## 2022-06-12 NOTE — H&P (Signed)
History and Physical    Eddie Graham [id number] DOB: 1934/12/05 DOA: 06/12/2022  Patient coming from: Home.  Chief Complaint: Bleeding from the colostomy site.  Patent attorney used.  HPI: Eddie Graham is a 88 y.o. male with history of chronic kidney disease stage III, hyperlipidemia, diabetes mellitus type 2, hypertension, CAD status post CABG, COPD, diverticulitis, severe diverticulosis was recently admitted at [facility] for planned sigmoid colectomy with end colostomy on 05/19/2022.    Last year patient was admitted on 03/10/2022 at [facility] for left lower quadrant pain at that time CT scan showed chronic diverticulitis with chronic intramural abscess was placed on antibiotics.  General surgery was consulted and plan was to follow as outpatient.  Since patient had worsening pain as outpatient had another CT scan done on 04/08/2022 which showed chronic intramural abscess was referred to surgery and underwent the above procedure.  Patient states since his surgery he has been having intermittent pain.  But over the last 24 hours his pain became more persistent.  And this morning around 6 AM he noticed frank bleeding from the ostomy site.  Takes aspirin and Plavix.  Did not take any NSAIDs or any Eddie anticoagulants.  Denies nausea vomiting.  Was able to keep in food.    ED Course: In the ER patient was afebrile hemodynamically stable.  Blood work showed hemoglobin 11.1 and creatinine of 1.5 which are at baseline.  CT scan of the abdomen pelvis done showed no active extravasation to suggest any active bleeding.  Did show features concerning for diverticulitis.  Also a lesion in the bladder concerning for urothelial malignancy which will need further workup.  Given the recent surgery ER physician suggested transferring to [facility] but patient refused and also ER physician discussed with Dr. Alessandra Bevels gastroenterologist will be seeing  patient in consult.  Due to diverticulitis empirically was started on Zosyn.  Review of Systems: As per HPI, rest all negative.   Past Medical History:  Diagnosis Date   AAA (abdominal aortic aneurysm) (HCC)    Anemia    Anxiety    Arthritis    Asthma    Back pain    Chronic kidney disease    COPD (chronic obstructive pulmonary disease) ([place])    Depression    Diabetes mellitus without complication ([place])    Dyspnea    Hypertension    Insomnia     Past Surgical History:  Procedure Laterality Date   CARDIAC SURGERY     CYSTOSCOPY WITH URETHRAL DILATATION N/A 09/30/2021   Procedure: CYSTOSCOPY WITH  URETHRAL BALLOON  DILATATION;  Surgeon: Robley Fries, MD;  Location: [facility];  Service: Urology;  Laterality: N/A;   CYSTOSCOPY WITH URETHRAL DILATATION N/A 12/09/2021   Procedure: CYSTOSCOPY WITH URETHRAL DILATATION;  Surgeon: Robley Fries, MD;  Location: [facility];  Service: Urology;  Laterality: N/A;  1 HR   PROSTATE SURGERY     TRANSURETHRAL RESECTION OF BLADDER TUMOR N/A 09/30/2021   Procedure: TRANSURETHRAL RESECTION OF BLADDER TUMOR (TURBT);  Surgeon: Robley Fries, MD;  Location: [facility];  Service: Urology;  Laterality: N/A;  90 MINS     reports that he has never smoked. He has never used smokeless tobacco. He reports that he does not currently use alcohol. He reports that he does not currently use drugs.  No Known Allergies  History reviewed. No pertinent family history.  Prior to Admission medications   Medication Sig Start Date End Date Taking? Authorizing Provider  albuterol (VENTOLIN HFA) 108 (90 Base) MCG/ACT inhaler Inhale 2 puffs into the lungs every 4 (four) hours as needed for wheezing. 11/11/20   [provider]  Carboxymethylcellulose Sodium (EYE DROPS OP) Place 2 drops into both eyes as needed (dryness/itching).    [provider]  clopidogrel (PLAVIX) 75 MG tablet Take 1 tablet (75 mg total) by mouth daily. 12/23/21   Geradine Girt, DO   Cyanocobalamin (B-12 PO) Take 1 capsule by mouth daily.    [provider]  diclofenac Sodium (VOLTAREN) 1 % GEL Apply 2 g topically 4 (four) times daily. 12/20/21   Geradine Girt, DO  DULoxetine (CYMBALTA) 60 MG capsule Take 60 mg by mouth daily. 02/07/21   [provider]  furosemide (LASIX) 40 MG tablet Take 1 tablet (40 mg total) by mouth daily. Patient taking differently: Take 40 mg by mouth 2 (two) times daily. 12/16/21   Geradine Girt, DO  gabapentin (NEURONTIN) 100 MG capsule Take 100 mg by mouth 3 (three) times daily. 02/25/21   [provider]  glipiZIDE (GLUCOTROL) 10 MG tablet Take 10 mg by mouth daily. 03/25/21   [provider]  LANTUS SOLOSTAR 100 UNIT/ML Solostar Pen Inject 24 Units into the skin at bedtime. 09/07/21   [provider]  lidocaine (LIDODERM) 5 % Place 1 patch onto the skin daily. Remove & Discard patch within 12 hours or as directed by MD 11/01/21   Blue, Soijett A, PA-C  metoprolol succinate (TOPROL-XL) 25 MG 24 hr tablet Take 25 mg by mouth daily. 04/07/21   [provider]  montelukast (SINGULAIR) 10 MG tablet Take 10 mg by mouth daily. 03/19/21   [provider]  pantoprazole (PROTONIX) 40 MG tablet Take 40 mg by mouth daily. 02/14/21   [provider]  pioglitazone (ACTOS) 30 MG tablet Take 30 mg by mouth daily. 12/22/21   [provider]  polyethylene glycol (MIRALAX / GLYCOLAX) 17 g packet Take 17 g by mouth daily as needed for mild constipation. 12/20/21   Geradine Girt, DO  rosuvastatin (CRESTOR) 40 MG tablet Take 40 mg by mouth daily. 04/07/21   [provider]  Semaglutide 14 MG TABS Take 14 mg by mouth daily. 03/14/21   [provider]  senna (SENOKOT) 8.6 MG TABS tablet Take 1 tablet (8.6 mg total) by mouth daily. 03/13/22   Iona Beard, MD  simethicone (MYLICON) 80 MG chewable tablet Chew 1 tablet (80 mg total) by mouth every 6 (six) hours as needed for  flatulence (gas pain). 03/12/22   Iona Beard, MD  tamsulosin (FLOMAX) 0.4 MG CAPS capsule Take 0.4 mg by mouth daily. Patient not taking: Reported on 12/05/2021 11/23/21   [provider]  traZODone (DESYREL) 50 MG tablet Take 100 mg by mouth daily. 03/14/21   [provider]  valsartan (DIOVAN) 80 MG tablet Take 80 mg by mouth at bedtime. 02/05/22   [provider]    Physical Exam: Constitutional: Moderately built and nourished. Vitals:   06/12/22 1409 06/12/22 1454 06/12/22 1717  BP: 128/89  (!) 140/63  Pulse: 72  69  Resp: 18  16  Temp: 97.7 F (36.5 C)  (!) 97.5 F (36.4 C)  TempSrc: Oral  Oral  SpO2: 95%  98%  Weight:  85 kg   Height:  5' 6"$  (1.676 m)    Eyes: Anicteric no pallor. ENMT: No discharge from the ears eyes nose and mouth. Neck: No mass felt.  No neck  rigidity. Respiratory: No rhonchi or crepitations. Cardiovascular: S1-S2 heard. Abdomen: Soft nontender.  Frank blood seen in the colostomy bag. Musculoskeletal: No edema. Skin: No rash. Neurologic: Alert awake oriented to time place and person.  Moves all extremities. Psychiatric: Appears normal.  Normal affect.   Labs on Admission: I have personally reviewed following labs and imaging studies  CBC: Recent Labs  Lab 06/12/22 1555 06/12/22 1639  WBC 9.1  --   NEUTROABS 6.1  --   HGB 11.1* 12.2*  HCT 36.8* 36.0*  MCV 80.5  --   PLT 429*  --    Basic Metabolic Panel: Recent Labs  Lab 06/12/22 1555 06/12/22 1639  NA 135 137  K 3.4* 3.6  CL 100 98  CO2 27  --   GLUCOSE 142* 141*  BUN 32* 31*  CREATININE 1.57* 1.60*  CALCIUM 8.6*  --    GFR: Estimated Creatinine Clearance: 34.5 mL/min (A) (by C-G formula based on SCr of 1.6 mg/dL (H)). Liver Function Tests: Recent Labs  Lab 06/12/22 1555  AST 15  ALT 13  ALKPHOS 74  BILITOT 0.5  PROT 6.7  ALBUMIN 2.9*   Recent Labs  Lab 06/12/22 1555  LIPASE 38   No results for input(s): "AMMONIA" in the last 168  hours. Coagulation Profile: No results for input(s): "INR", "PROTIME" in the last 168 hours. Cardiac Enzymes: No results for input(s): "CKTOTAL", "CKMB", "CKMBINDEX", "TROPONINI" in the last 168 hours. BNP (last 3 results) No results for input(s): "PROBNP" in the last 8760 hours. HbA1C: No results for input(s): "HGBA1C" in the last 72 hours. CBG: No results for input(s): "GLUCAP" in the last 168 hours. Lipid Profile: No results for input(s): "CHOL", "HDL", "LDLCALC", "TRIG", "CHOLHDL", "LDLDIRECT" in the last 72 hours. Thyroid Function Tests: No results for input(s): "TSH", "T4TOTAL", "FREET4", "T3FREE", "THYROIDAB" in the last 72 hours. Anemia Panel: No results for input(s): "VITAMINB12", "FOLATE", "FERRITIN", "TIBC", "IRON", "RETICCTPCT" in the last 72 hours. Urine analysis:    Component Value Date/Time   COLORURINE STRAW (A) 06/12/2022 1809   APPEARANCEUR CLEAR 06/12/2022 1809   LABSPEC 1.004 (L) 06/12/2022 1809   PHURINE 6.0 06/12/2022 1809   GLUCOSEU NEGATIVE 06/12/2022 1809   HGBUR MODERATE (A) 06/12/2022 1809   BILIRUBINUR NEGATIVE 06/12/2022 1809   KETONESUR NEGATIVE 06/12/2022 1809   PROTEINUR NEGATIVE 06/12/2022 1809   NITRITE NEGATIVE 06/12/2022 1809   LEUKOCYTESUR NEGATIVE 06/12/2022 1809   Sepsis Labs: @LABRCNTIP$ (procalcitonin:4,lacticidven:4) )No results found for this or any previous visit (from the past 240 hour(s)).   Radiological Exams on Admission: CT Angio Abd/Pel W and/or Wo Contrast  Result Date: 06/12/2022 CLINICAL DATA:  Bright red blood in ostomy, rule out active extravasation EXAM: CTA ABDOMEN AND PELVIS WITHOUT AND WITH CONTRAST TECHNIQUE: Multidetector CT imaging of the abdomen and pelvis was performed using the standard protocol during bolus administration of intravenous contrast. Multiplanar reconstructed images and MIPs were obtained and reviewed to evaluate the vascular anatomy. RADIATION DOSE REDUCTION: This exam was performed according to the  departmental dose-optimization program which includes automated exposure control, adjustment of the mA and/or kV according to patient size and/or use of iterative reconstruction technique. CONTRAST:  84m OMNIPAQUE IOHEXOL 350 MG/ML SOLN COMPARISON:  05/24/2022 FINDINGS: VASCULAR Severe mixed calcific atherosclerosis. 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 No evidence of dissection or Eddie acute aortic pathology. Standard branching pattern of the abdominal aorta  with solitary bilateral renal arteries. Atherosclerosis at the branch vessel origins without high-grade stenosis. 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 NON-VASCULAR Lower chest: No acute abnormality.  Coronary artery calcifications. Hepatobiliary: No solid liver abnormality is seen. No gallstones, gallbladder wall thickening, or biliary dilatation. Pancreas: Unremarkable. No pancreatic ductal dilatation or surrounding inflammatory changes. Spleen: Normal in size without significant abnormality. Adrenals/Urinary Tract: Adrenal glands are unremarkable. Simple, benign renal cortical cysts, for which no further follow-up or characterization is required. Kidneys are otherwise normal, without renal calculi, solid lesion, or hydronephrosis. Contrast enhancing endoluminal nodule of the anterior left aspect of the urinary bladder measuring 0.7 x 0.5 cm (series 5, image 40). Additional contrast endoluminal nodule of the posterior left aspect of the bladder measuring 0.7 x 0.6 cm (series 5, image 27) Stomach/Bowel: Stomach is within normal limits. Pancolonic diverticulosis. Status post sigmoid colon resection with left lower quadrant end colostomy. Unchanged fat  stranding about the distal descending and sigmoid colon, extending into the ostomy defect (series 5, image 145). No intraluminal contrast extravasation to localize reported GI bleeding. Lymphatic: No enlarged abdominal or pelvic lymph nodes. Reproductive: No mass or Eddie significant abnormality. Eddie: Fat containing right inguinal hernia.  No ascites. Musculoskeletal: No acute or significant osseous findings. IMPRESSION: 1. No intraluminal contrast extravasation to localize reported GI bleeding. 2. Severe pancolonic diverticulosis. Status post sigmoid colon resection with left lower quadrant end colostomy. Unchanged fat stranding about the distal descending and sigmoid colon, extending into the ostomy defect. Findings suggest persistent diverticulitis. 3. Contrast enhancing endoluminal nodule of the anterior left aspect of the urinary bladder measuring 0.7 x 0.5 cm. Additional contrast endoluminal nodule of the posterior left aspect of the bladder measuring 0.7 x 0.6 cm. These are suspicious for urothelial malignancy. Consider cystoscopy to further evaluate on a nonemergent, outpatient basis 4. Severe mixed calcific atherosclerosis. 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 No evidence of acute dissection or Eddie acute aortic pathology. Recommend referral to a vascular specialist on a nonemergent, outpatient basis if clinically appropriate and not already obtained. This recommendation follows ACR consensus guidelines: White Paper of the ACR Incidental Findings Committee II on Vascular Findings. J Am Coll Radiol 2013; 10:789-794. 5. 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 Recommendation as above. 6. Coronary artery disease. Electronically Signed   By: Delanna Ahmadi M.D.   On: 06/12/2022 19:09     Assessment/Plan Principal Problem:   Lower GI bleeding Active Problems:   Type 2 diabetes mellitus with complication, without long-term current use of insulin (HCC)   CAD (coronary artery disease)   CKD (chronic kidney disease) stage 3, GFR 30-59 ml/min (HCC)   Acute GI bleeding    Acute lower GI bleeding likely from diverticulosis with recent planned sigmoid colectomy at [facility] on 05/19/2022 -   will check serial CBCs and transfuse for hemoglobin less than 8 given that patient has CAD.  Dr. Alessandra Bevels gastroenterologist on-call has agreed to see the patient.  Will keep patient NPO. Diverticulitis -CT scan does show persistent diverticulitis.  Patient on Zosyn.  Will follow GI recommendations. Anemia appears to be chronic.  Since patient has acute GI bleed will check serial CBCs.  Hold antiplatelet agents for now. Recent planned sigmoid colectomy at [facility] on 05/19/2022. Diabetes mellitus type 2 takes Lantus 24 units at bedtime.  Last hemoglobin A1c on 03/24/2022 was 7.7.  Since patient is n.p.o. I have kept patient on sliding scale coverage.  If CBG goes consistently above 180 will start at least half the dose of Lantus while NPO.  Back to full dose once patient starts eating. Infrarenal aneurysm will need follow-up as outpatient. Abnormal urinary bladder lesion concerning for urothelial malignancy will need urology referral. Hypertension will keep patient n.p.o. and IV labetalol while NPO.  Takes ARB and metoprolol at home. CAD status post CABG denies any chest pain.  Holding antiplatelet agents due to GI bleed.  Will continue metoprolol. Chronic kidney disease stage III creatinine at around baseline. Hyperlipidemia takes statin. COPD not actively wheezing.   DVT prophylaxis: SCDs. Code Status: Full code. Family Communication: Discussed with patient. Disposition Plan: Admit to [facility]. Consults called: GI. Admission status: Observation.

## 2022-06-12 NOTE — ED Triage Notes (Signed)
Pt reports left lower abd pain radiating to left lower back x few days.

## 2022-06-13 DIAGNOSIS — Z794 Long term (current) use of insulin: Secondary | ICD-10-CM | POA: Diagnosis not present

## 2022-06-13 DIAGNOSIS — Z79899 Other long term (current) drug therapy: Secondary | ICD-10-CM | POA: Diagnosis not present

## 2022-06-13 DIAGNOSIS — Z951 Presence of aortocoronary bypass graft: Secondary | ICD-10-CM | POA: Diagnosis not present

## 2022-06-13 DIAGNOSIS — I129 Hypertensive chronic kidney disease with stage 1 through stage 4 chronic kidney disease, or unspecified chronic kidney disease: Secondary | ICD-10-CM | POA: Diagnosis present

## 2022-06-13 DIAGNOSIS — N183 Chronic kidney disease, stage 3 unspecified: Secondary | ICD-10-CM | POA: Diagnosis present

## 2022-06-13 DIAGNOSIS — E1140 Type 2 diabetes mellitus with diabetic neuropathy, unspecified: Secondary | ICD-10-CM | POA: Diagnosis present

## 2022-06-13 DIAGNOSIS — I7143 Infrarenal abdominal aortic aneurysm, without rupture: Secondary | ICD-10-CM | POA: Diagnosis present

## 2022-06-13 DIAGNOSIS — Z603 Acculturation difficulty: Secondary | ICD-10-CM | POA: Diagnosis present

## 2022-06-13 DIAGNOSIS — J4489 Other specified chronic obstructive pulmonary disease: Secondary | ICD-10-CM | POA: Diagnosis present

## 2022-06-13 DIAGNOSIS — E1122 Type 2 diabetes mellitus with diabetic chronic kidney disease: Secondary | ICD-10-CM | POA: Diagnosis present

## 2022-06-13 DIAGNOSIS — Z9049 Acquired absence of other specified parts of digestive tract: Secondary | ICD-10-CM | POA: Diagnosis not present

## 2022-06-13 DIAGNOSIS — Z7902 Long term (current) use of antithrombotics/antiplatelets: Secondary | ICD-10-CM | POA: Diagnosis not present

## 2022-06-13 DIAGNOSIS — I2510 Atherosclerotic heart disease of native coronary artery without angina pectoris: Secondary | ICD-10-CM | POA: Diagnosis present

## 2022-06-13 DIAGNOSIS — Z8550 Personal history of malignant neoplasm of unspecified urinary tract organ: Secondary | ICD-10-CM | POA: Diagnosis not present

## 2022-06-13 DIAGNOSIS — K921 Melena: Secondary | ICD-10-CM | POA: Diagnosis present

## 2022-06-13 DIAGNOSIS — I252 Old myocardial infarction: Secondary | ICD-10-CM | POA: Diagnosis not present

## 2022-06-13 DIAGNOSIS — K922 Gastrointestinal hemorrhage, unspecified: Secondary | ICD-10-CM | POA: Diagnosis not present

## 2022-06-13 DIAGNOSIS — R710 Precipitous drop in hematocrit: Secondary | ICD-10-CM | POA: Diagnosis present

## 2022-06-13 DIAGNOSIS — K5733 Diverticulitis of large intestine without perforation or abscess with bleeding: Secondary | ICD-10-CM | POA: Diagnosis present

## 2022-06-13 DIAGNOSIS — N329 Bladder disorder, unspecified: Secondary | ICD-10-CM | POA: Diagnosis present

## 2022-06-13 DIAGNOSIS — E1142 Type 2 diabetes mellitus with diabetic polyneuropathy: Secondary | ICD-10-CM | POA: Diagnosis present

## 2022-06-13 DIAGNOSIS — Z7982 Long term (current) use of aspirin: Secondary | ICD-10-CM | POA: Diagnosis not present

## 2022-06-13 DIAGNOSIS — F32A Depression, unspecified: Secondary | ICD-10-CM | POA: Diagnosis present

## 2022-06-13 DIAGNOSIS — Z7984 Long term (current) use of oral hypoglycemic drugs: Secondary | ICD-10-CM | POA: Diagnosis not present

## 2022-06-13 DIAGNOSIS — Z933 Colostomy status: Secondary | ICD-10-CM | POA: Diagnosis not present

## 2022-06-13 DIAGNOSIS — E785 Hyperlipidemia, unspecified: Secondary | ICD-10-CM | POA: Diagnosis present

## 2022-06-13 LAB — COMPREHENSIVE METABOLIC PANEL
ALT: 12 U/L (ref 0–44)
AST: 14 U/L — ABNORMAL LOW (ref 15–41)
Albumin: 2.7 g/dL — ABNORMAL LOW (ref 3.5–5.0)
Alkaline Phosphatase: 73 U/L (ref 38–126)
Anion gap: 8 (ref 5–15)
BUN: 30 mg/dL — ABNORMAL HIGH (ref 8–23)
CO2: 24 mmol/L (ref 22–32)
Calcium: 8.2 mg/dL — ABNORMAL LOW (ref 8.9–10.3)
Chloride: 104 mmol/L (ref 98–111)
Creatinine, Ser: 1.47 mg/dL — ABNORMAL HIGH (ref 0.61–1.24)
GFR, Estimated: 46 mL/min — ABNORMAL LOW (ref >60)
Glucose, Bld: 108 mg/dL — ABNORMAL HIGH (ref 70–99)
Potassium: 3.6 mmol/L (ref 3.5–5.1)
Sodium: 136 mmol/L (ref 135–145)
Total Bilirubin: 0.7 mg/dL (ref 0.3–1.2)
Total Protein: 6.3 g/dL — ABNORMAL LOW (ref 6.5–8.1)

## 2022-06-13 LAB — CBC
HCT: 35.1 % — ABNORMAL LOW (ref 39.0–52.0)
HCT: 35.3 % — ABNORMAL LOW (ref 39.0–52.0)
HCT: 34.9 % — ABNORMAL LOW (ref 39.0–52.0)
Hemoglobin: 11 g/dL — ABNORMAL LOW (ref 13.0–17.0)
Hemoglobin: 10.9 g/dL — ABNORMAL LOW (ref 13.0–17.0)
Hemoglobin: 10.8 g/dL — ABNORMAL LOW (ref 13.0–17.0)
MCH: 24.6 pg — ABNORMAL LOW (ref 26.0–34.0)
MCH: 24.6 pg — ABNORMAL LOW (ref 26.0–34.0)
MCH: 24.7 pg — ABNORMAL LOW (ref 26.0–34.0)
MCHC: 31.3 g/dL (ref 30.0–36.0)
MCHC: 30.9 g/dL (ref 30.0–36.0)
MCHC: 30.9 g/dL (ref 30.0–36.0)
MCV: 78.5 fL — ABNORMAL LOW (ref 80.0–100.0)
MCV: 79.7 fL — ABNORMAL LOW (ref 80.0–100.0)
MCV: 79.7 fL — ABNORMAL LOW (ref 80.0–100.0)
Platelets: 402 10*3/uL — ABNORMAL HIGH (ref 150–400)
Platelets: 353 10*3/uL (ref 150–400)
Platelets: 355 10*3/uL (ref 150–400)
RBC: 4.47 MIL/uL (ref 4.22–5.81)
RBC: 4.43 MIL/uL (ref 4.22–5.81)
RBC: 4.38 MIL/uL (ref 4.22–5.81)
RDW: 15.9 % — ABNORMAL HIGH (ref 11.5–15.5)
RDW: 15.8 % — ABNORMAL HIGH (ref 11.5–15.5)
RDW: 15.8 % — ABNORMAL HIGH (ref 11.5–15.5)
WBC: 7.3 10*3/uL (ref 4.0–10.5)
WBC: 7.6 10*3/uL (ref 4.0–10.5)
WBC: 7 10*3/uL (ref 4.0–10.5)
nRBC: 0 % (ref 0.0–0.2)
nRBC: 0 % (ref 0.0–0.2)
nRBC: 0 % (ref 0.0–0.2)

## 2022-06-13 LAB — GLUCOSE, CAPILLARY
Glucose-Capillary: 113 mg/dL — ABNORMAL HIGH (ref 70–99)
Glucose-Capillary: 92 mg/dL (ref 70–99)
Glucose-Capillary: 97 mg/dL (ref 70–99)
Glucose-Capillary: 117 mg/dL — ABNORMAL HIGH (ref 70–99)
Glucose-Capillary: 158 mg/dL — ABNORMAL HIGH (ref 70–99)

## 2022-06-13 LAB — LACTIC ACID, PLASMA: Lactic Acid, Venous: 1 mmol/L (ref 0.5–1.9)

## 2022-06-13 LAB — PROTIME-INR
INR: 1.1 (ref 0.8–1.2)
Prothrombin Time: 14.6 seconds (ref 11.4–15.2)

## 2022-06-13 MED ORDER — INSULIN ASPART 100 UNIT/ML IJ SOLN
0.0000 [IU] | Freq: Four times a day (QID) | INTRAMUSCULAR | Status: DC
  Administered 2022-06-13: 2 [IU] via SUBCUTANEOUS
  Administered 2022-06-14: 1 [IU] via SUBCUTANEOUS
  Administered 2022-06-14: 3 [IU] via SUBCUTANEOUS
  Administered 2022-06-14: 5 [IU] via SUBCUTANEOUS
  Administered 2022-06-15: 2 [IU] via SUBCUTANEOUS

## 2022-06-13 NOTE — Consult Note (Signed)
Referring Provider:  [place] Primary Care Physician:  Kristopher Glee., MD Primary Gastroenterologist: [facility]  Reason for Consultation: Bleeding through colostomy site  HPI: Eddie Graham is a 88 y.o. male with past medical history significant for recurrent diverticulitis/complicated diverticulitis with recent partial sigmoid colectomy with end colostomy on May 19, 2022 at [facility], history of coronary artery disease s/p CABG on aspirin and Plavix, COPD, chronic kidney disease presented to the hospital with bleeding from colostomy site.  Patient with mild drop in hemoglobin to 10.9.  Normal LFTs and INR.  CT angio yesterday showed no evidence of active bleeding but showed unchanged fat stranding around the distal descending and sigmoid colon concerning for persistent diverticulitis.  Showed finding concerning for urothelial malignancy and recommended cystoscopy for further evaluation.  [employer] interpreter service used for history taking.  Patient continues to have bleeding in the ostomy bag.  Also complaining of ongoing left lower quadrant abdominal pain.  Denies nausea or vomiting.  He is feeling hungry and wants to eat something.  Past Medical History:  Diagnosis Date   AAA (abdominal aortic aneurysm) (HCC)    Anemia    Anxiety    Arthritis    Asthma    Back pain    Chronic kidney disease    COPD (chronic obstructive pulmonary disease) ([place])    Depression    Diabetes mellitus without complication ([place])    Dyspnea    Hypertension    Insomnia     Past Surgical History:  Procedure Laterality Date   CARDIAC SURGERY     CYSTOSCOPY WITH URETHRAL DILATATION N/A 09/30/2021   Procedure: CYSTOSCOPY WITH  URETHRAL BALLOON  DILATATION;  Surgeon: Robley Fries, MD;  Location: [facility];  Service: Urology;  Laterality: N/A;   CYSTOSCOPY WITH URETHRAL DILATATION N/A 12/09/2021   Procedure: CYSTOSCOPY WITH URETHRAL DILATATION;  Surgeon: Robley Fries, MD;   Location: [facility];  Service: Urology;  Laterality: N/A;  1 HR   PROSTATE SURGERY     TRANSURETHRAL RESECTION OF BLADDER TUMOR N/A 09/30/2021   Procedure: TRANSURETHRAL RESECTION OF BLADDER TUMOR (TURBT);  Surgeon: Robley Fries, MD;  Location: [facility];  Service: Urology;  Laterality: N/A;  64 MINS    Prior to Admission medications   Medication Sig Start Date End Date Taking? Authorizing Provider  albuterol (VENTOLIN HFA) 108 (90 Base) MCG/ACT inhaler Inhale 2 puffs into the lungs every 4 (four) hours as needed for wheezing. 11/11/20  Yes [provider]  Carboxymethylcellulose Sodium (EYE DROPS OP) Place 2 drops into both eyes as needed (dryness/itching).   Yes [provider]  clopidogrel (PLAVIX) 75 MG tablet Take 1 tablet (75 mg total) by mouth daily. 12/23/21  Yes Vann, Jessica U, DO  Cyanocobalamin (B-12 PO) Take 1 capsule by mouth daily.   Yes [provider]  diclofenac Sodium (VOLTAREN) 1 % GEL Apply 2 g topically 4 (four) times daily. 12/20/21  Yes Vann, Jessica U, DO  DULoxetine (CYMBALTA) 60 MG capsule Take 60 mg by mouth daily. 02/07/21  Yes [provider]  gabapentin (NEURONTIN) 100 MG capsule Take 100 mg by mouth 3 (three) times daily. 02/25/21  Yes [provider]  glipiZIDE (GLUCOTROL) 10 MG tablet Take 10 mg by mouth 2 (two) times daily before a meal. 03/25/21  Yes [provider]  ibuprofen (ADVIL) 200 MG tablet Take 200 mg by mouth every 6 (six) hours as needed for mild pain.   Yes [provider]  LANTUS SOLOSTAR 100 UNIT/ML Solostar Pen Inject 24 Units into the skin at bedtime. 09/07/21  Yes [provider]  lidocaine (LIDODERM) 5 % Place 1 patch onto the skin daily. Remove & Discard patch within 12 hours or as directed by MD 11/01/21  Yes Blue, Soijett A, PA-C  metoprolol succinate (TOPROL-XL) 25 MG 24 hr tablet Take 25 mg by mouth daily. 04/07/21  Yes [provider]  montelukast (SINGULAIR) 10 MG  tablet Take 10 mg by mouth daily. 03/19/21  Yes [provider]  pantoprazole (PROTONIX) 40 MG tablet Take 40 mg by mouth daily. 02/14/21  Yes [provider]  pioglitazone (ACTOS) 30 MG tablet Take 30 mg by mouth daily. 12/22/21  Yes [provider]  rosuvastatin (CRESTOR) 40 MG tablet Take 40 mg by mouth daily. 04/07/21  Yes [provider]  Semaglutide 14 MG TABS Take 14 mg by mouth daily. 03/14/21  Yes [provider]  senna (SENOKOT) 8.6 MG TABS tablet Take 1 tablet (8.6 mg total) by mouth daily. 03/13/22  Yes Iona Beard, MD  traZODone (DESYREL) 50 MG tablet Take 50 mg by mouth daily. 03/14/21  Yes [provider]  valsartan (DIOVAN) 80 MG tablet Take 80 mg by mouth at bedtime. 02/05/22  Yes [provider]  furosemide (LASIX) 40 MG tablet Take 1 tablet (40 mg total) by mouth daily. Patient taking differently: Take 20 mg by mouth daily. 12/16/21   Geradine Girt, DO  polyethylene glycol (MIRALAX / GLYCOLAX) 17 g packet Take 17 g by mouth daily as needed for mild constipation. Patient not taking: Reported on 06/13/2022 12/20/21   Geradine Girt, DO  simethicone (MYLICON) 80 MG chewable tablet Chew 1 tablet (80 mg total) by mouth every 6 (six) hours as needed for flatulence (gas pain). Patient not taking: Reported on 06/13/2022 03/12/22   Iona Beard, MD  tamsulosin (FLOMAX) 0.4 MG CAPS capsule Take 0.4 mg by mouth daily. Patient not taking: Reported on 12/05/2021 11/23/21   [provider]    Scheduled Meds:  insulin aspart  0-9 Units Subcutaneous Q6H   metoprolol succinate  25 mg Oral Daily   pantoprazole (PROTONIX) IV  40 mg Intravenous Q24H   Continuous Infusions:  sodium chloride 75 mL/hr at 06/13/22 0045   piperacillin-tazobactam (ZOSYN)  IV 3.375 g (06/13/22 0317)   PRN Meds:.albuterol, fentaNYL (SUBLIMAZE) injection, labetalol  Allergies as of 06/12/2022   (No Known Allergies)    History reviewed. No  pertinent family history.  Social History   Socioeconomic History   Marital status: Married    Spouse name: Not on file   Number of children: Not on file   Years of education: Not on file   Highest education level: Not on file  Occupational History   Not on file  Tobacco Use   Smoking status: Never   Smokeless tobacco: Never  Vaping Use   Vaping Use: Never used  Substance and Sexual Activity   Alcohol use: Not Currently   Drug use: Not Currently   Sexual activity: Not on file  Other Topics Concern   Not on file  Social History Narrative   Not on file   Social Determinants of Health   Financial Resource Strain: Low Risk  (08/29/2021)   Overall Financial Resource Strain (CARDIA)    Difficulty of Paying Living Expenses: Not very hard  Food Insecurity: No Food Insecurity (06/13/2022)   Hunger Vital Sign    Worried About Running Out of Food in  the Last Year: Never true    [place] in the Last Year: Never true  Transportation Needs: No Transportation Needs (06/13/2022)   PRAPARE - Hydrologist (Medical): No    Lack of Transportation (Non-Medical): No  Physical Activity: Not on file  Stress: Not on file  Social Connections: Not on file  Intimate Partner Violence: Not At Risk (06/13/2022)   Humiliation, Afraid, Rape, and Kick questionnaire    Fear of Current or Ex-Partner: No    Emotionally Abused: No    Physically Abused: No    Sexually Abused: No    Review of Systems: Limited review of system per HPI  Physical Exam: Vital signs: Vitals:   06/13/22 0913 06/13/22 0914  BP: (!) 142/66 (!) 142/66  Pulse: 71 71  Resp:    Temp:    SpO2:     Last BM Date : 06/13/22 General:   Alert,  Well-developed, well-nourished, pleasant and cooperative in NAD Lungs: No visible respiratory distress Heart:  Regular rate and rhythm; no murmurs, clicks, rubs,  or gallops. Abdomen: Left lower quadrant ostomy with evidence of fresh blood in the ostomy,  left lower quadrant tenderness to palpation, abdomen is otherwise soft, bowel sounds present, no peritoneal signs Rectal:  Deferred  GI:  Lab Results: Recent Labs    06/13/22 0047 06/13/22 0232 06/13/22 1024  WBC 7.3 7.6 7.0  HGB 11.0* 10.9* 10.8*  HCT 35.1* 35.3* 34.9*  PLT 402* 353 355   BMET Recent Labs    06/12/22 1555 06/12/22 1639 06/13/22 0047  NA 135 137 136  K 3.4* 3.6 3.6  CL 100 98 104  CO2 27  --  24  GLUCOSE 142* 141* 108*  BUN 32* 31* 30*  CREATININE 1.57* 1.60* 1.47*  CALCIUM 8.6*  --  8.2*   LFT Recent Labs    06/13/22 0047  PROT 6.3*  ALBUMIN 2.7*  AST 14*  ALT 12  ALKPHOS 73  BILITOT 0.7   PT/INR Recent Labs    06/13/22 0047  LABPROT 14.6  INR 1.1     Studies/Results: CT Angio Abd/Pel W and/or Wo Contrast  Result Date: 06/12/2022 CLINICAL DATA:  Bright red blood in ostomy, rule out active extravasation EXAM: CTA ABDOMEN AND PELVIS WITHOUT AND WITH CONTRAST TECHNIQUE: Multidetector CT imaging of the abdomen and pelvis was performed using the standard protocol during bolus administration of intravenous contrast. Multiplanar reconstructed images and MIPs were obtained and reviewed to evaluate the vascular anatomy. RADIATION DOSE REDUCTION: This exam was performed according to the departmental dose-optimization program which includes automated exposure control, adjustment of the mA and/or kV according to patient size and/or use of iterative reconstruction technique. CONTRAST:  60m OMNIPAQUE IOHEXOL 350 MG/ML SOLN COMPARISON:  05/24/2022 FINDINGS: VASCULAR Severe mixed calcific atherosclerosis. 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 No evidence of dissection or other acute aortic pathology. Standard branching pattern of the abdominal aorta with solitary bilateral renal arteries. Atherosclerosis at the branch  vessel origins without high-grade stenosis. 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 NON-VASCULAR Lower chest: No acute abnormality.  Coronary artery calcifications. Hepatobiliary: No solid liver abnormality is seen. No gallstones, gallbladder wall thickening, or biliary dilatation. Pancreas:  Unremarkable. No pancreatic ductal dilatation or surrounding inflammatory changes. Spleen: Normal in size without significant abnormality. Adrenals/Urinary Tract: Adrenal glands are unremarkable. Simple, benign renal cortical cysts, for which no further follow-up or characterization is required. Kidneys are otherwise normal, without renal calculi, solid lesion, or hydronephrosis. Contrast enhancing endoluminal nodule of the anterior left aspect of the urinary bladder measuring 0.7 x 0.5 cm (series 5, image 40). Additional contrast endoluminal nodule of the posterior left aspect of the bladder measuring 0.7 x 0.6 cm (series 5, image 27) Stomach/Bowel: Stomach is within normal limits. Pancolonic diverticulosis. Status post sigmoid colon resection with left lower quadrant end colostomy. Unchanged fat stranding about the distal descending and sigmoid colon, extending into the ostomy defect (series 5, image 145). No intraluminal contrast extravasation to localize reported GI bleeding. Lymphatic: No enlarged abdominal or pelvic lymph nodes. Reproductive: No mass or other significant abnormality. Other: Fat containing right inguinal hernia.  No ascites. Musculoskeletal: No acute or significant osseous findings. IMPRESSION: 1. No intraluminal contrast extravasation to localize reported GI bleeding. 2. Severe pancolonic diverticulosis. Status post sigmoid colon resection with left lower quadrant end colostomy. Unchanged fat stranding about the distal descending and sigmoid colon,  extending into the ostomy defect. Findings suggest persistent diverticulitis. 3. Contrast enhancing endoluminal nodule of the anterior left aspect of the urinary bladder measuring 0.7 x 0.5 cm. Additional contrast endoluminal nodule of the posterior left aspect of the bladder measuring 0.7 x 0.6 cm. These are suspicious for urothelial malignancy. Consider cystoscopy to further evaluate on a nonemergent, outpatient basis 4. Severe mixed calcific atherosclerosis. 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 No evidence of acute dissection or other acute aortic pathology. Recommend referral to a vascular specialist on a nonemergent, outpatient basis if clinically appropriate and not already obtained. This recommendation follows ACR consensus guidelines: White Paper of the ACR Incidental Findings Committee II on Vascular Findings. J Am Coll Radiol 2013; 10:789-794. 5. 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 Recommendation as above. 6. Coronary artery disease. Electronically Signed   By: Delanna Ahmadi M.D.   On: 06/12/2022 19:09    Impression/Plan: -Bleeding from ostomy site in a patient with recent history of partial sigmoid colectomy around 3 weeks ago complicated diverticulitis.  CT scan showed finding concerning for persistent diverticulitis. 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 -H/O  coronary disease.  Was on aspirin and Plavix. -Chronic kidney disease  Recommendations ----------------------- -Colonoscopy contraindicated because of recent surgery as well as recent CT scan showing persistent diverticulitis. -Recommend to hold Plavix for at least 5 days to minimize risk of ongoing bleeding. -Continue IV antibiotics for now -Restart clear liquids diet. -GI will follow  -Recommend that he follow-up with his  primary management team at [facility] after discharge. -Recommend 2 weeks of antibiotics for his diverticulitis     LOS: 0 days   Otis Brace  MD, FACP 06/13/2022, 11:43 AM  Contact #  [phone]

## 2022-06-13 NOTE — Progress Notes (Signed)
  Transition of Care [facility]) Screening Note   Patient Details  Name: Buz Dorgan Date of Birth: 1934-06-25   Transition of Care [facility]) CM/SW Contact:    Henrietta Dine, RN Phone Number: 06/13/2022, 10:20 AM    Transition of Care Department [facility]) has reviewed patient and no TOC needs have been identified at this time. We will continue to monitor patient advancement through interdisciplinary progression rounds. If new patient transition needs arise, please place a TOC consult.

## 2022-06-13 NOTE — Progress Notes (Addendum)
PROGRESS NOTE   Eddie Graham  0000000 DOB: 06/23/1934 DOA: 06/12/2022 PCP: Kristopher Glee., MD  Brief Narrative:   88 year old Hispanic origin male Known heart attack status post CABG back in 2011 up in [place] (records not available) Known history chronic diverticulitis with multiple admissions here at the [facility] system  he is status post resection of a perforated intramural sigmoid colonic abscess 05/20/2022 + end colostomy [Dr. Anson Crofts [facility]]  He presented for follow-up 06/03/2022 and still had abdominal pain Also status post TURBT as well as cystoscopy with urethral dilatation in the past by Dr. Claudia Desanctis urology  Brought to emergency room [facility] left lower abdominal pain with radiation to low back-noticed bright red blood in the ostomy-note that he is on aspirin and Plavix  Hospital-Problem based course  Acute GI bleed -Looks like it is a slow bleed as hemoglobin has remained stable overnight - Defer to GI procedure, resumption of antiplatelet therapy, workup inclusive of scope, diet - Holding at this time Plavix 75, ASA - Saline 75 cc/H while n.p.o.  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 Claudia Desanctis - Unknown rationale for TURBT--cannot see the notes [facility] notes - Outpatient follow-up with Dr. Claudia Desanctis of urology, will CC  CAD CABG 2011 - Resume once taking diet Crestor 40, valsartan 80 at bedtime  DM TY 2 - Hold glipizide 10 twice daily, Actos 30 daily, semaglutide 14 daily - Continue SSI every 6 hourly  Depression - Holding Cymbalta 60  Neuropathy NOS - Continue Neurontin 100 3 times daily when able to take p.o.   DVT prophylaxis: SCD Code Status: Full Family Communication: None Disposition:  Status is: Observation The patient will require care spanning > 2 midnights and should be moved to inpatient because:   Requires workup   Subjective: Dark-tinged liquid stool in bag -  changed w whole bag of stool earlier today He has no fever no chills no nausea no vomiting  Objective: Vitals:   06/13/22 0152 06/13/22 0507 06/13/22 0913 06/13/22 0914  BP: 136/71 (!) 142/70 (!) 142/66 (!) 142/66  Pulse: 67 70 71 71  Resp: 20 18    Temp: 98.8 F (37.1 C) 97.7 F (36.5 C)    TempSrc:  Oral    SpO2: 92% 95%    Weight:      Height:        Intake/Output Summary (Last 24 hours) at 06/13/2022 1135 Last data filed at 06/13/2022 1027 Gross per 24 hour  Intake 1221.65 ml  Output 76 ml  Net 1145.65 ml   Filed Weights   06/12/22 1454  Weight: 85 kg    Examination:  EOMI NCAT looking about stated age No icterus no pallor Neck soft supple Chest is clear Ostomy with little bit of dark-colored red fluid  Data Reviewed: personally reviewed   CBC    Component Value Date/Time   WBC 7.0 06/13/2022 1024   RBC 4.38 06/13/2022 1024   HGB 10.8 (L) 06/13/2022 1024   HCT 34.9 (L) 06/13/2022 1024   PLT 355 06/13/2022 1024   MCV 79.7 (L) 06/13/2022 1024   MCH 24.7 (L) 06/13/2022 1024   MCHC 30.9 06/13/2022 1024   RDW 15.8 (H) 06/13/2022 1024   LYMPHSABS 2.3 06/12/2022 1555   MONOABS 0.5 06/12/2022 1555   EOSABS 0.2 06/12/2022 1555   BASOSABS 0.0 06/12/2022 1555      Latest Ref Rng & Units 06/13/2022   12:47 AM 06/12/2022  4:39 PM 06/12/2022    3:55 PM  CMP  Glucose 70 - 99 mg/dL 108  141  142   BUN 8 - 23 mg/dL 30  31  32   Creatinine 0.61 - 1.24 mg/dL 1.47  1.60  1.57   Sodium 135 - 145 mmol/L 136  137  135   Potassium 3.5 - 5.1 mmol/L 3.6  3.6  3.4   Chloride 98 - 111 mmol/L 104  98  100   CO2 22 - 32 mmol/L 24   27   Calcium 8.9 - 10.3 mg/dL 8.2   8.6   Total Protein 6.5 - 8.1 g/dL 6.3   6.7   Total Bilirubin 0.3 - 1.2 mg/dL 0.7   0.5   Alkaline Phos 38 - 126 U/L 73   74   AST 15 - 41 U/L 14   15   ALT 0 - 44 U/L 12   13      Radiology Studies: CT Angio Abd/Pel W and/or Wo Contrast  Result Date: 06/12/2022 CLINICAL DATA:  Bright red blood in  ostomy, rule out active extravasation EXAM: CTA ABDOMEN AND PELVIS WITHOUT AND WITH CONTRAST TECHNIQUE: Multidetector CT imaging of the abdomen and pelvis was performed using the standard protocol during bolus administration of intravenous contrast. Multiplanar reconstructed images and MIPs were obtained and reviewed to evaluate the vascular anatomy. RADIATION DOSE REDUCTION: This exam was performed according to the departmental dose-optimization program which includes automated exposure control, adjustment of the mA and/or kV according to patient size and/or use of iterative reconstruction technique. CONTRAST:  93m OMNIPAQUE IOHEXOL 350 MG/ML SOLN COMPARISON:  05/24/2022 FINDINGS: VASCULAR Severe mixed calcific atherosclerosis. 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 No evidence of dissection or other acute aortic pathology. Standard branching pattern of the abdominal aorta with solitary bilateral renal arteries. Atherosclerosis at the branch vessel origins without high-grade stenosis. 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 NON-VASCULAR Lower chest: No acute abnormality.  Coronary artery calcifications. Hepatobiliary: No solid liver abnormality is seen. No gallstones, gallbladder wall thickening, or biliary dilatation. Pancreas: Unremarkable. No pancreatic ductal dilatation or surrounding inflammatory changes. Spleen: Normal in size without significant abnormality. Adrenals/Urinary Tract: Adrenal glands are unremarkable. Simple, benign renal cortical cysts, for which no further follow-up or characterization is required. Kidneys are otherwise normal, without renal calculi, solid lesion, or hydronephrosis. Contrast  enhancing endoluminal nodule of the anterior left aspect of the urinary bladder measuring 0.7 x 0.5 cm (series 5, image 40). Additional contrast endoluminal nodule of the posterior left aspect of the bladder measuring 0.7 x 0.6 cm (series 5, image 27) Stomach/Bowel: Stomach is within normal limits. Pancolonic diverticulosis. Status post sigmoid colon resection with left lower quadrant end colostomy. Unchanged fat stranding about the distal descending and sigmoid colon, extending into the ostomy defect (series 5, image 145). No intraluminal contrast extravasation to localize reported GI bleeding. Lymphatic: No enlarged abdominal or pelvic lymph nodes. Reproductive: No mass or other significant abnormality. Other: Fat containing right inguinal hernia.  No ascites. Musculoskeletal: No acute or significant osseous findings. IMPRESSION: 1. No intraluminal contrast extravasation to localize reported GI bleeding. 2. Severe pancolonic diverticulosis. Status post sigmoid colon resection with left lower quadrant end colostomy. Unchanged fat  stranding about the distal descending and sigmoid colon, extending into the ostomy defect. Findings suggest persistent diverticulitis. 3. Contrast enhancing endoluminal nodule of the anterior left aspect of the urinary bladder measuring 0.7 x 0.5 cm. Additional contrast endoluminal nodule of the posterior left aspect of the bladder measuring 0.7 x 0.6 cm. These are suspicious for urothelial malignancy. Consider cystoscopy to further evaluate on a nonemergent, outpatient basis 4. Severe mixed calcific atherosclerosis. 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 No evidence of acute dissection or other acute aortic pathology. Recommend referral to a vascular specialist on a nonemergent, outpatient basis if clinically appropriate and not already obtained. This recommendation  follows ACR consensus guidelines: White Paper of the ACR Incidental Findings Committee II on Vascular Findings. J Am Coll Radiol 2013; 10:789-794. 5. 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 Recommendation as above. 6. Coronary artery disease. Electronically Signed   By: Delanna Ahmadi M.D.   On: 06/12/2022 19:09     Scheduled Meds:  insulin aspart  0-9 Units Subcutaneous Q6H   metoprolol succinate  25 mg Oral Daily   pantoprazole (PROTONIX) IV  40 mg Intravenous Q24H   Continuous Infusions:  sodium chloride 75 mL/hr at 06/13/22 0045   piperacillin-tazobactam (ZOSYN)  IV 3.375 g (06/13/22 0317)     LOS: 0 days   Time spent: [place], MD [facility] To contact the attending provider between 7A-7P or the covering provider during after hours 7P-7A, please log into the web site [email or web address] and access using universal [place] password for that web site. If you do not have the password, please call the hospital operator.  06/13/2022, 11:35 AM

## 2022-06-14 DIAGNOSIS — K922 Gastrointestinal hemorrhage, unspecified: Secondary | ICD-10-CM | POA: Diagnosis not present

## 2022-06-14 LAB — CBC
HCT: 36.4 % — ABNORMAL LOW (ref 39.0–52.0)
Hemoglobin: 11.2 g/dL — ABNORMAL LOW (ref 13.0–17.0)
MCH: 24.7 pg — ABNORMAL LOW (ref 26.0–34.0)
MCHC: 30.8 g/dL (ref 30.0–36.0)
MCV: 80.2 fL (ref 80.0–100.0)
Platelets: 343 10*3/uL (ref 150–400)
RBC: 4.54 MIL/uL (ref 4.22–5.81)
RDW: 15.7 % — ABNORMAL HIGH (ref 11.5–15.5)
WBC: 7.2 10*3/uL (ref 4.0–10.5)
nRBC: 0 % (ref 0.0–0.2)

## 2022-06-14 LAB — GLUCOSE, CAPILLARY
Glucose-Capillary: 104 mg/dL — ABNORMAL HIGH (ref 70–99)
Glucose-Capillary: 114 mg/dL — ABNORMAL HIGH (ref 70–99)
Glucose-Capillary: 121 mg/dL — ABNORMAL HIGH (ref 70–99)
Glucose-Capillary: 235 mg/dL — ABNORMAL HIGH (ref 70–99)
Glucose-Capillary: 260 mg/dL — ABNORMAL HIGH (ref 70–99)

## 2022-06-14 MED ORDER — PIOGLITAZONE HCL 30 MG PO TABS
30.0000 mg | ORAL_TABLET | Freq: Every day | ORAL | Status: DC
  Administered 2022-06-15: 30 mg via ORAL
  Filled 2022-06-14: qty 1

## 2022-06-14 MED ORDER — DULOXETINE HCL 60 MG PO CPEP
60.0000 mg | ORAL_CAPSULE | Freq: Every day | ORAL | Status: DC
  Administered 2022-06-14 – 2022-06-15 (×2): 60 mg via ORAL
  Filled 2022-06-14 (×2): qty 1

## 2022-06-14 MED ORDER — INSULIN GLARGINE-YFGN 100 UNIT/ML ~~LOC~~ SOLN
12.0000 [IU] | Freq: Every day | SUBCUTANEOUS | Status: DC
  Administered 2022-06-14: 12 [IU] via SUBCUTANEOUS
  Filled 2022-06-14 (×2): qty 0.12

## 2022-06-14 MED ORDER — GABAPENTIN 100 MG PO CAPS
100.0000 mg | ORAL_CAPSULE | Freq: Three times a day (TID) | ORAL | Status: DC
  Administered 2022-06-14 – 2022-06-15 (×3): 100 mg via ORAL
  Filled 2022-06-14 (×3): qty 1

## 2022-06-14 MED ORDER — TRAZODONE HCL 50 MG PO TABS
50.0000 mg | ORAL_TABLET | Freq: Every day | ORAL | Status: DC
  Administered 2022-06-14: 50 mg via ORAL
  Filled 2022-06-14: qty 1

## 2022-06-14 MED ORDER — DICLOFENAC SODIUM 1 % EX GEL
2.0000 g | Freq: Four times a day (QID) | CUTANEOUS | Status: DC
  Administered 2022-06-14 – 2022-06-15 (×3): 2 g via TOPICAL
  Filled 2022-06-14: qty 100

## 2022-06-14 MED ORDER — IRBESARTAN 75 MG PO TABS
37.5000 mg | ORAL_TABLET | Freq: Every day | ORAL | Status: DC
  Administered 2022-06-14 – 2022-06-15 (×2): 37.5 mg via ORAL
  Filled 2022-06-14 (×2): qty 1

## 2022-06-14 MED ORDER — TRAZODONE HCL 50 MG PO TABS
25.0000 mg | ORAL_TABLET | Freq: Once | ORAL | Status: AC
  Administered 2022-06-14: 25 mg via ORAL
  Filled 2022-06-14: qty 1

## 2022-06-14 MED ORDER — TAMSULOSIN HCL 0.4 MG PO CAPS
0.4000 mg | ORAL_CAPSULE | Freq: Every day | ORAL | Status: DC
  Administered 2022-06-14 – 2022-06-15 (×2): 0.4 mg via ORAL
  Filled 2022-06-14 (×2): qty 1

## 2022-06-14 NOTE — Progress Notes (Signed)
[facility] Progress Note  Eddie Graham 88 y.o. [id number]  CC:  Bleeding through colostomy site    Subjective: Patient seen and examined at bedside.  Sitting in a recliner.  Not in acute distress.  Denies any further bleeding.  Wants to advance diet.  ROS : afebrile, negative for chest pain and shortness of breath.   Objective: Vital signs in last 24 hours: Vitals:   06/14/22 0000 06/14/22 0409  BP: (!) 140/71 (!) 146/55  Pulse: 71 68  Resp: 20 18  Temp: (!) 97.4 F (36.3 C) 97.8 F (36.6 C)  SpO2: 95% 95%    Physical Exam:  General:  Alert, cooperative, no distress, appears stated age  Head:  Normocephalic, without obvious abnormality, atraumatic  Eyes:  , EOM's intact,   Lungs:   Clear to auscultation bilaterally, respirations unlabored  Heart:  Regular rate and rhythm, S1, S2 normal  Abdomen:   Soft, non-tender, no blood in the ostomy, bowel sounds present.  Extremities: Extremities normal, atraumatic, no  edema  Pulses: 2+ and symmetric    Lab Results: Recent Labs    06/12/22 1555 06/12/22 1639 06/13/22 0047  NA 135 137 136  K 3.4* 3.6 3.6  CL 100 98 104  CO2 27  --  24  GLUCOSE 142* 141* 108*  BUN 32* 31* 30*  CREATININE 1.57* 1.60* 1.47*  CALCIUM 8.6*  --  8.2*   Recent Labs    06/12/22 1555 06/13/22 0047  AST 15 14*  ALT 13 12  ALKPHOS 74 73  BILITOT 0.5 0.7  PROT 6.7 6.3*  ALBUMIN 2.9* 2.7*   Recent Labs    06/12/22 1555 06/12/22 1639 06/13/22 1024 06/14/22 0505  WBC 9.1   < > 7.0 7.2  NEUTROABS 6.1  --   --   --   HGB 11.1*   < > 10.8* 11.2*  HCT 36.8*   < > 34.9* 36.4*  MCV 80.5   < > 79.7* 80.2  PLT 429*   < > 355 343   < > = values in this interval not displayed.   Recent Labs    06/13/22 0047  LABPROT 14.6  INR 1.1      Assessment/Plan: -Bleeding from ostomy site in a patient with recent history of partial sigmoid colectomy around 3 weeks ago complicated diverticulitis.  CT scan showed finding  concerning for persistent diverticulitis. 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 -H/O  coronary disease.  Was on aspirin and Plavix. -Chronic kidney disease   Recommendations ----------------------- -Colonoscopy contraindicated because of recent surgery as well as recent CT scan showing persistent diverticulitis. -Advance diet to soft.  Avoid NSAIDs. -Recommend to hold Plavix for at least 5 days to minimize risk of ongoing bleeding.  Okay to continue baby aspirin. -Change antibiotic to p.o.Recommend 2 weeks of antibiotics for his diverticulitis -Recommend that he follow-up with his primary management team at [facility] after discharge. -No further inpatient GI workup planned.  GI will sign off.  Call us back if needed.   Otis Brace MD, [place] 06/14/2022, 9:09 AM  Contact #  [phone]

## 2022-06-14 NOTE — Progress Notes (Signed)
PROGRESS NOTE   Eddie Graham  0000000 DOB: [birth year] DOA: 06/12/2022 PCP: Kristopher Glee., MD  Brief Narrative:   88 year old Hispanic origin male Known heart attack status post CABG back in 2011 up in [place] (records not available) Known history chronic diverticulitis with multiple admissions here at the [facility] system  he is status post resection of a perforated intramural sigmoid colonic abscess 05/20/2022 + end colostomy [Dr. Anson Crofts [facility]]  He presented for follow-up 06/03/2022 and still had abdominal pain Also status post TURBT as well as cystoscopy with urethral dilatation in the past by Dr. 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 Claudia Desanctis - Unknown rationale for TURBT--cannot see the notes [facility] notes - Outpatient follow-up with Dr. Claudia Desanctis of urology, will CC at time of discharge  CAD CABG 2011 - Resume on d/c  Crestor 40,  -Restart valsartan 80 at bedtime  DM TY 2 - Hold glipizide 10 twice daily, Actos 30 daily, semaglutide 14 daily - Continue SSI every 6 hourly  Depression - Cymbalta 60  Neuropathy NOS - Continue Neurontin 100 3 times qd   DVT prophylaxis: SCD Code Status: Full Family Communication: None Disposition:  Status is: Observation The patient will require care spanning > 2 midnights and should be moved to inpatient because:   Requires workup and stability over the next day prior to discussion about  discharge   Subjective:  No further dark fluid or blood-tinged stool in the ostomy He feels fine and has been eating He is asking for his home meds which include some gabapentin and other psychotropics  Objective: Vitals:   06/14/22 0000 06/14/22 0409 06/14/22 1028 06/14/22 1311  BP: (!) 140/71 (!) 146/55 (!) 129/59 (!) 140/58  Pulse: 71 68 79 77  Resp: 20 18  18  $ Temp: (!) 97.4 F (36.3 C) 97.8 F (36.6 C)  98.4 F (36.9 C)  TempSrc: Oral Oral  Oral  SpO2: 95% 95%  97%  Weight:      Height:        Intake/Output Summary (Last 24 hours) at 06/14/2022 1508 Last data filed at 06/14/2022 0825 Gross per 24 hour  Intake 240 ml  Output --  Net 240 ml    Filed Weights   06/12/22 1454  Weight: 85 kg    Examination:  EOMI NCAT looking about stated age No icterus no pallor Neck soft supple Chest is clear Ostomy has no stool in it at this time  Data Reviewed: personally reviewed   CBC    Component Value Date/Time   WBC 7.2 06/14/2022 0505   RBC 4.54 06/14/2022 0505   HGB 11.2 (L) 06/14/2022 0505   HCT 36.4 (L) 06/14/2022 0505   PLT 343 06/14/2022 0505   MCV 80.2 06/14/2022 0505   MCH 24.7 (L) 06/14/2022 0505   MCHC 30.8 06/14/2022 0505   RDW 15.7 (H) 06/14/2022 0505   LYMPHSABS 2.3 06/12/2022 1555   MONOABS 0.5 06/12/2022 1555   EOSABS  0.2 06/12/2022 1555   BASOSABS 0.0 06/12/2022 1555      Latest Ref Rng & Units 06/13/2022   12:47 AM 06/12/2022    4:39 PM 06/12/2022    3:55 PM  CMP  Glucose 70 - 99 mg/dL 108  141  142   BUN 8 - 23 mg/dL 30  31  32   Creatinine 0.61 - 1.24 mg/dL 1.47  1.60  1.57   Sodium 135 - 145 mmol/L 136  137  135   Potassium 3.5 - 5.1 mmol/L 3.6  3.6  3.4   Chloride 98 - 111 mmol/L 104  98  100   CO2 22 - 32 mmol/L 24   27   Calcium 8.9 - 10.3 mg/dL 8.2   8.6   Total Protein 6.5 - 8.1 g/dL 6.3   6.7   Total Bilirubin 0.3 - 1.2 mg/dL 0.7   0.5   Alkaline Phos 38 - 126 U/L 73   74   AST 15 - 41 U/L 14   15   ALT 0 - 44 U/L 12   13       Radiology Studies: CT Angio Abd/Pel W and/or Wo Contrast  Result Date: 06/12/2022 CLINICAL DATA:  Bright red blood in ostomy, rule out active extravasation EXAM: CTA ABDOMEN AND PELVIS WITHOUT AND WITH CONTRAST TECHNIQUE: Multidetector CT imaging of the abdomen and pelvis was performed using the standard protocol during bolus administration of intravenous contrast. Multiplanar reconstructed images and MIPs were obtained and reviewed to evaluate the vascular anatomy. RADIATION DOSE REDUCTION: This exam was performed according to the departmental dose-optimization program which includes automated exposure control, adjustment of the mA and/or kV according to patient size and/or use of iterative reconstruction technique. CONTRAST:  49m OMNIPAQUE IOHEXOL 350 MG/ML SOLN COMPARISON:  05/24/2022 FINDINGS: VASCULAR Severe mixed calcific atherosclerosis. 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 No evidence of dissection or other acute aortic pathology. Standard branching pattern of the abdominal aorta with solitary bilateral renal arteries. Atherosclerosis at the branch vessel origins without high-grade stenosis. 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 NON-VASCULAR Lower chest: No acute abnormality.  Coronary artery calcifications. Hepatobiliary: No solid liver abnormality is seen. No gallstones, gallbladder wall thickening, or biliary dilatation. Pancreas: Unremarkable. No pancreatic ductal dilatation or surrounding inflammatory changes. Spleen: Normal in size without significant abnormality. Adrenals/Urinary Tract: Adrenal glands are unremarkable. Simple, benign renal cortical cysts, for which no further follow-up  or characterization is required. Kidneys are otherwise normal, without renal calculi, solid lesion, or hydronephrosis. Contrast enhancing endoluminal nodule of the anterior left aspect of the urinary bladder measuring 0.7 x 0.5 cm (series 5, image 40). Additional contrast endoluminal nodule of the posterior left aspect of the bladder measuring 0.7 x 0.6 cm (series 5, image 27) Stomach/Bowel: Stomach is within normal limits. Pancolonic diverticulosis. Status post sigmoid colon resection with left lower quadrant end colostomy. Unchanged fat stranding about the distal descending and sigmoid colon, extending into the ostomy defect (series 5, image 145). No intraluminal contrast extravasation to localize reported GI bleeding. Lymphatic: No enlarged abdominal or pelvic lymph nodes. Reproductive: No mass or other significant abnormality. Other: Fat containing right inguinal hernia.  No ascites. Musculoskeletal: No acute or significant osseous findings.  IMPRESSION: 1. No intraluminal contrast extravasation to localize reported GI bleeding. 2. Severe pancolonic diverticulosis. Status post sigmoid colon resection with left lower quadrant end colostomy. Unchanged fat stranding about the distal descending and sigmoid colon, extending into the ostomy defect. Findings suggest persistent diverticulitis. 3. Contrast enhancing endoluminal nodule of the anterior left aspect of the urinary bladder measuring 0.7 x 0.5 cm. Additional contrast endoluminal nodule of the posterior left aspect of the bladder measuring 0.7 x 0.6 cm. These are suspicious for urothelial malignancy. Consider cystoscopy to further evaluate on a nonemergent, outpatient basis 4. Severe mixed calcific atherosclerosis. 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 No evidence of acute dissection or other acute aortic pathology. Recommend referral to a  vascular specialist on a nonemergent, outpatient basis if clinically appropriate and not already obtained. This recommendation follows ACR consensus guidelines: White Paper of the ACR Incidental Findings Committee II on Vascular Findings. J Am Coll Radiol 2013; 10:789-794. 5. 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 Recommendation as above. 6. Coronary artery disease. Electronically Signed   By: Delanna Ahmadi M.D.   On: 06/12/2022 19:09     Scheduled Meds:  DULoxetine  60 mg Oral Daily   gabapentin  100 mg Oral TID   insulin aspart  0-9 Units Subcutaneous Q6H   metoprolol succinate  25 mg Oral Daily   pantoprazole (PROTONIX) IV  40 mg Intravenous Q24H   traZODone  50 mg Oral QHS   Continuous Infusions:  piperacillin-tazobactam (ZOSYN)  IV 3.375 g (06/14/22 1243)     LOS: 1 day   Time spent: [place], MD [facility] To contact the attending provider between 7A-7P or the covering provider during after hours 7P-7A, please log into the web site [email or web address] and access using universal [place] password for that web site. If you do not have the password, please call the hospital operator.  06/14/2022, 3:08 PM

## 2022-06-15 ENCOUNTER — Other Ambulatory Visit (HOSPITAL_COMMUNITY): Payer: Self-pay

## 2022-06-15 DIAGNOSIS — K922 Gastrointestinal hemorrhage, unspecified: Secondary | ICD-10-CM | POA: Diagnosis not present

## 2022-06-15 LAB — CBC WITH DIFFERENTIAL/PLATELET
Abs Immature Granulocytes: 0.03 10*3/uL (ref 0.00–0.07)
Basophils Absolute: 0 10*3/uL (ref 0.0–0.1)
Basophils Relative: 1 %
Eosinophils Absolute: 0.3 10*3/uL (ref 0.0–0.5)
Eosinophils Relative: 4 %
HCT: 34.8 % — ABNORMAL LOW (ref 39.0–52.0)
Hemoglobin: 10.8 g/dL — ABNORMAL LOW (ref 13.0–17.0)
Immature Granulocytes: 0 %
Lymphocytes Relative: 28 %
Lymphs Abs: 2.2 10*3/uL (ref 0.7–4.0)
MCH: 24.6 pg — ABNORMAL LOW (ref 26.0–34.0)
MCHC: 31 g/dL (ref 30.0–36.0)
MCV: 79.3 fL — ABNORMAL LOW (ref 80.0–100.0)
Monocytes Absolute: 0.5 10*3/uL (ref 0.1–1.0)
Monocytes Relative: 7 %
Neutro Abs: 4.6 10*3/uL (ref 1.7–7.7)
Neutrophils Relative %: 60 %
Platelets: 306 10*3/uL (ref 150–400)
RBC: 4.39 MIL/uL (ref 4.22–5.81)
RDW: 15.7 % — ABNORMAL HIGH (ref 11.5–15.5)
WBC: 7.7 10*3/uL (ref 4.0–10.5)
nRBC: 0 % (ref 0.0–0.2)

## 2022-06-15 LAB — GLUCOSE, CAPILLARY
Glucose-Capillary: 160 mg/dL — ABNORMAL HIGH (ref 70–99)
Glucose-Capillary: 126 mg/dL — ABNORMAL HIGH (ref 70–99)

## 2022-06-15 MED ORDER — ACETAMINOPHEN 325 MG PO TABS
650.0000 mg | ORAL_TABLET | Freq: Once | ORAL | Status: AC
  Administered 2022-06-15: 650 mg via ORAL
  Filled 2022-06-15: qty 2

## 2022-06-15 MED ORDER — TECHLITE PEN NEEDLES 32G X 4 MM MISC
11 refills | Status: DC
  Filled 2022-06-15: qty 100, 90d supply, fill #0
  Filled 2022-12-08: qty 100, 90d supply, fill #1

## 2022-06-15 MED ORDER — LANTUS SOLOSTAR 100 UNIT/ML ~~LOC~~ SOPN
15.0000 [IU] | PEN_INJECTOR | Freq: Every day | SUBCUTANEOUS | 11 refills | Status: DC
  Filled 2022-06-15: qty 15, 100d supply, fill #0

## 2022-06-15 MED ORDER — AMOXICILLIN-POT CLAVULANATE 875-125 MG PO TABS
1.0000 | ORAL_TABLET | Freq: Two times a day (BID) | ORAL | 0 refills | Status: AC
  Filled 2022-06-15: qty 20, 10d supply, fill #0

## 2022-06-15 MED ORDER — PANTOPRAZOLE SODIUM 40 MG PO TBEC
40.0000 mg | DELAYED_RELEASE_TABLET | Freq: Two times a day (BID) | ORAL | 1 refills | Status: DC
  Filled 2022-06-15: qty 60, 30d supply, fill #0

## 2022-06-15 MED ORDER — CLOPIDOGREL BISULFATE 75 MG PO TABS: 75.0000 mg | ORAL_TABLET | Freq: Every day | ORAL | Status: DC

## 2022-06-15 NOTE — Discharge Summary (Signed)
Physician Discharge Summary  Eddie Graham [id number] DOB: 03/15/1935 DOA: 06/12/2022  PCP: Kristopher Glee., MD  Admit date: 06/12/2022 Discharge date: 06/15/2022  Time spent: 37 minutes  Recommendations for Outpatient Follow-up:  Resume Plavix as per below Protonix twice daily, noticed several medications including Lasix have been held until reevaluation in the outpatient setting Will need follow-up with Dr. 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 DM TY 2 Depression Neuropathy  Please see below for itemized issues addressed in [place]- refer to other progress notes for clarity if needed  Discharge Condition: Improved  Diet recommendation: Heart healthy  Filed Weights   06/12/22 1454  Weight: 85 kg    History of present illness:  88 year old Hispanic origin male Known heart attack status post CABG back in 2011 up in [place] (records not available) Known history chronic diverticulitis with multiple admissions here at the [facility] system   he is status post resection of a perforated intramural sigmoid colonic abscess 05/20/2022 + end colostomy [Dr. Anson Crofts [facility]]   He presented for follow-up 06/03/2022 and still had abdominal pain Also status post TURBT as well as cystoscopy with urethral dilatation in the past by Dr. Claudia Desanctis urology   Brought to emergency room [place] [facility] left lower abdominal pain with radiation to low back-noticed bright red blood in the ostomy-note that he is on aspirin and Plavix  Hospital Course:  Acute GI bleed - Resolved, no further bleeding in ostomy - Holding at this time Plavix 75 can resume likely on 06/18/2022, as per the to [facility]  who did not feel that the patient required colonoscopy as this was probably contraindicated if this is diverticulitis and the hemoglobin did not drop precipitously to warrant the same -Hemoglobin has remained stable during hospital stay and there is no acute blood loss anemia   Prior urethral dilatation with TURBT, 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 Claudia Desanctis - Unknown rationale for TURBT--cannot see the notes [facility] notes - Outpatient follow-up with Dr. Claudia Desanctis of urology, will CC at time of discharge   CAD CABG 2011 - Resume on d/c  Crestor 40,  - Restart valsartan 80 at bedtime -I have held PTA Lasix and this can be resumed in the outpatient setting   DM TY 2 - Hold glipizide 10 twice daily, Actos 30 daily, semaglutide 14 daily - Sugars monitor rate controlled during hospital stay above meds resumed on discharge - On discharge I did cut back the dose of the patient's long-acting to 15 units and he can titrate back to 24   Depression - Cymbalta 60   Neuropathy NOS - Continue Neurontin 100 3 times qd     Discharge Exam: Vitals:   06/14/22 2135 06/15/22 0538  BP: 124/78 135/85  Pulse: 73 85  Resp: 18 20  Temp: 97.7 F (36.5 C) 97.8 F (36.6 C)  SpO2: 97% 94%    Subj on day of d/c   Coherent pleasant no distress No bleeding Eating full meals No nausea no vomiting No chest pain  Discharge Instructions   Discharge Instructions     Diet - low sodium heart healthy   Complete by: As directed    Discharge  instructions   Complete by: As directed    This will stay you were diagnosed with a small amount of stomach bleeding and we were not sure of the course or the reason for it You had a recent surgery so you could have been bleeding from the sutures or it could have been from underlying diverticulitis-both of those issues are contraindications to scope  Please continue the Plavix but make sure you do not resume it before Friday, 06/18/2022 You  will also need antibiotics Augmentin to complete the whole course because of your recent surgery You should take Protonix twice a day to prevent the risk of bleeding and you can continue this until the time you see your surgeon next week at [place] that your insulin doses have changed slightly and we have cut back the 24 units to 15 units and make sure that you increase it as you need to   Increase activity slowly   Complete by: As directed       Allergies as of 06/15/2022   No Known Allergies      Medication List     STOP taking these medications    B-12 PO   furosemide 40 MG tablet Commonly known as: LASIX   ibuprofen 200 MG tablet Commonly known as: ADVIL   polyethylene glycol 17 g packet Commonly known as: MIRALAX / GLYCOLAX   simethicone 80 MG chewable tablet Commonly known as: MYLICON       TAKE these medications    albuterol 108 (90 Base) MCG/ACT inhaler Commonly known as: VENTOLIN HFA Inhale 2 puffs into the lungs every 4 (four) hours as needed for wheezing.   amoxicillin-clavulanate 875-125 MG tablet Commonly known as: AUGMENTIN Take 1 tablet by mouth 2 (two) times daily for 10 days.   clopidogrel 75 MG tablet Commonly known as: PLAVIX Take 1 tablet (75 mg total) by mouth daily. Start taking on: June 19, 2022 What changed: These instructions start on June 19, 2022. If you are unsure what to do until then, ask your doctor or other care provider.   diclofenac Sodium 1 % Gel Commonly known as: VOLTAREN Apply 2 g topically 4 (four) times daily.   DULoxetine 60 MG capsule Commonly known as: CYMBALTA Take 60 mg by mouth daily.   EYE DROPS OP Place 2 drops into both eyes as needed (dryness/itching).   gabapentin 100 MG capsule Commonly known as: NEURONTIN Take 100 mg by mouth 3 (three) times daily.   glipiZIDE 10 MG tablet Commonly known as: GLUCOTROL Take 10 mg by mouth 2 (two) times daily before a meal.   Lantus SoloStar 100  UNIT/ML Solostar Pen Generic drug: insulin glargine Inject 15 Units into the skin at bedtime. What changed: how much to take   lidocaine 5 % Commonly known as: Lidoderm Place 1 patch onto the skin daily. Remove & Discard patch within 12 hours or as directed by MD   metoprolol succinate 25 MG 24 hr tablet Commonly known as: TOPROL-XL Take 25 mg by mouth daily.   montelukast 10 MG tablet Commonly known as: SINGULAIR Take 10 mg by mouth daily.   pantoprazole 40 MG tablet Commonly known as: PROTONIX Take 1 tablet (40 mg total) by mouth 2 (two) times daily. What changed: when to take this   pioglitazone 30 MG tablet Commonly known as: ACTOS Take 30 mg by mouth daily.   rosuvastatin 40 MG tablet Commonly known as: CRESTOR Take 40 mg by mouth daily.   Semaglutide 14  MG Tabs Take 14 mg by mouth daily.   senna 8.6 MG Tabs tablet Commonly known as: SENOKOT Take 1 tablet (8.6 mg total) by mouth daily.   tamsulosin 0.4 MG Caps capsule Commonly known as: FLOMAX Take 0.4 mg by mouth daily.   traZODone 50 MG tablet Commonly known as: DESYREL Take 50 mg by mouth daily.   valsartan 80 MG tablet Commonly known as: DIOVAN Take 80 mg by mouth at bedtime.       No Known Allergies    The results of significant diagnostics from this hospitalization (including imaging, microbiology, ancillary and laboratory) are listed below for reference.    Significant Diagnostic Studies: CT Angio Abd/Pel W and/or Wo Contrast  Result Date: 06/12/2022 CLINICAL DATA:  Bright red blood in ostomy, rule out active extravasation EXAM: CTA ABDOMEN AND PELVIS WITHOUT AND WITH CONTRAST TECHNIQUE: Multidetector CT imaging of the abdomen and pelvis was performed using the standard protocol during bolus administration of intravenous contrast. Multiplanar reconstructed images and MIPs were obtained and reviewed to evaluate the vascular anatomy. RADIATION DOSE REDUCTION: This exam was performed according to  the departmental dose-optimization program which includes automated exposure control, adjustment of the mA and/or kV according to patient size and/or use of iterative reconstruction technique. CONTRAST:  74m OMNIPAQUE IOHEXOL 350 MG/ML SOLN COMPARISON:  05/24/2022 FINDINGS: VASCULAR Severe mixed calcific atherosclerosis. 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 No evidence of dissection or other acute aortic pathology. Standard branching pattern of the abdominal aorta with solitary bilateral renal arteries. Atherosclerosis at the branch vessel origins without high-grade stenosis. 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 NON-VASCULAR Lower chest: No acute abnormality.  Coronary artery calcifications. Hepatobiliary: No solid liver abnormality is seen. No gallstones, gallbladder wall thickening, or biliary dilatation. Pancreas: Unremarkable. No pancreatic ductal dilatation or surrounding inflammatory changes. Spleen: Normal in size without significant abnormality. Adrenals/Urinary Tract: Adrenal glands are unremarkable. Simple, benign renal cortical cysts, for which no further follow-up or characterization is required. Kidneys are otherwise normal, without renal calculi, solid lesion, or hydronephrosis. Contrast enhancing endoluminal nodule of the anterior left aspect of the urinary bladder measuring 0.7 x 0.5 cm (series 5, image 40). Additional contrast endoluminal nodule of the posterior left aspect of the bladder measuring 0.7 x 0.6 cm (series 5, image 27) Stomach/Bowel: Stomach is within normal limits. Pancolonic diverticulosis. Status post sigmoid colon resection with left lower quadrant end colostomy. Unchanged fat  stranding about the distal descending and sigmoid colon, extending into the ostomy defect (series 5, image 145). No intraluminal contrast extravasation to localize reported GI bleeding. Lymphatic: No enlarged abdominal or pelvic lymph nodes. Reproductive: No mass or other significant abnormality. Other: Fat containing right inguinal hernia.  No ascites. Musculoskeletal: No acute or significant osseous findings. IMPRESSION: 1. No intraluminal contrast extravasation to localize reported GI bleeding. 2. Severe pancolonic diverticulosis. Status post sigmoid colon resection with left lower quadrant end colostomy. Unchanged fat stranding about the distal descending and sigmoid colon, extending into the ostomy defect. Findings suggest persistent diverticulitis. 3. Contrast enhancing endoluminal nodule of the anterior left aspect of the urinary bladder measuring 0.7 x 0.5 cm. Additional contrast endoluminal nodule of the posterior left aspect of the bladder measuring 0.7 x 0.6 cm.  These are suspicious for urothelial malignancy. Consider cystoscopy to further evaluate on a nonemergent, outpatient basis 4. Severe mixed calcific atherosclerosis. 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 No evidence of acute dissection or other acute aortic pathology. Recommend referral to a vascular specialist on a nonemergent, outpatient basis if clinically appropriate and not already obtained. This recommendation follows ACR consensus guidelines: White Paper of the ACR Incidental Findings Committee II on Vascular Findings. J Am Coll Radiol 2013; 10:789-794. 5. 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 Recommendation as above. 6. Coronary artery disease. Electronically Signed   By: Delanna Ahmadi M.D.   On: 06/12/2022 19:09     Microbiology: Recent Results (from the past 240 hour(s))  Blood culture (routine x 2)     Status: None (Preliminary result)   Collection Time: 06/12/22  8:20 PM   Specimen: BLOOD  Result Value Ref Range Status   Specimen Description   Final    BLOOD RIGHT ANTECUBITAL Performed at [facility] [address], [place], [place] [address]    Special Requests   Final    BOTTLES DRAWN AEROBIC AND ANAEROBIC Blood Culture adequate volume Performed at [facility] [address] [place], [place] [address]    Culture   Final    NO GROWTH 3 DAYS Performed at [facility], [place] [address], [place], [place] [address]    Report Status PENDING  Incomplete  Blood culture (routine x 2)     Status: None (Preliminary result)   Collection Time: 06/12/22  8:25 PM   Specimen: BLOOD LEFT WRIST  Result Value Ref Range Status   Specimen Description   Final    BLOOD LEFT WRIST Performed at [facility], [place] [address] [place], [place] [address]    Special Requests   Final    BOTTLES DRAWN AEROBIC AND ANAEROBIC Blood Culture adequate volume Performed at [facility] [address] [place], [place] [address]    Culture   Final    NO GROWTH 3 DAYS Performed at [facility], [place] [address], [place], [place] [address]    Report Status PENDING  Incomplete     Labs: Basic Metabolic Panel: Recent Labs  Lab 06/12/22 1555 06/12/22 1639 06/13/22 0047  NA 135 137 136  K 3.4* 3.6 3.6  CL 100 98 104  CO2 27  --  24  GLUCOSE 142* 141* 108*  BUN 32* 31* 30*  CREATININE 1.57* 1.60* 1.47*  CALCIUM 8.6*  --  8.2*   Liver Function Tests: Recent Labs  Lab 06/12/22 1555 06/13/22 0047  AST 15 14*  ALT 13 12  ALKPHOS 74 73  BILITOT 0.5 0.7  PROT 6.7 6.3*  ALBUMIN 2.9* 2.7*   Recent Labs  Lab 06/12/22 1555  LIPASE 38   No results for input(s): "AMMONIA" in the last 168 hours. CBC: Recent Labs  Lab  06/12/22 1555 06/12/22 1639 06/13/22 0047 06/13/22 0232 06/13/22 1024 06/14/22 0505 06/15/22 0434  WBC 9.1  --  7.3 7.6 7.0 7.2 7.7  NEUTROABS 6.1  --   --   --   --   --  4.6  HGB 11.1*   < > 11.0* 10.9* 10.8* 11.2* 10.8*  HCT 36.8*   < > 35.1* 35.3* 34.9* 36.4* 34.8*  MCV 80.5  --  78.5* 79.7* 79.7* 80.2 79.3*  PLT 429*  --  402* 353 355 343 306   < > = values in this interval not displayed.   Cardiac Enzymes: No results for input(s): "CKTOTAL", "CKMB", "CKMBINDEX", "TROPONINI" in the last 168 hours. BNP: BNP (last 3 results) No results for input(s): "BNP" in the last 8760 hours.  ProBNP (last 3 results) No results for input(s): "PROBNP" in the last 8760 hours.  CBG: Recent Labs  Lab 06/14/22 1159 06/14/22 1805 06/14/22 2328 06/15/22 0539 06/15/22 0801  GLUCAP 235* 260* 104* 160* 126*       Signed:  Nita Sells MD   [facility] 06/15/2022, 9:07 AM

## 2022-06-17 LAB — CULTURE, BLOOD (ROUTINE X 2)
Culture: NO GROWTH
Culture: NO GROWTH
Special Requests: ADEQUATE
Special Requests: ADEQUATE

## 2022-07-17 ENCOUNTER — Encounter (HOSPITAL_COMMUNITY): Payer: Self-pay

## 2022-07-17 ENCOUNTER — Emergency Department (HOSPITAL_COMMUNITY)
Admission: EM | Admit: 2022-07-17 | Discharge: 2022-07-18 | Disposition: A | Discharge status: Home / Self Care | Payer: 59 | Attending: Emergency Medicine | Admitting: Emergency Medicine

## 2022-07-17 ENCOUNTER — Other Ambulatory Visit: Payer: Self-pay

## 2022-07-17 DIAGNOSIS — Z7984 Long term (current) use of oral hypoglycemic drugs: Secondary | ICD-10-CM | POA: Insufficient documentation

## 2022-07-17 DIAGNOSIS — E1165 Type 2 diabetes mellitus with hyperglycemia: Secondary | ICD-10-CM | POA: Diagnosis not present

## 2022-07-17 DIAGNOSIS — Z7951 Long term (current) use of inhaled steroids: Secondary | ICD-10-CM | POA: Diagnosis not present

## 2022-07-17 DIAGNOSIS — N183 Chronic kidney disease, stage 3 unspecified: Secondary | ICD-10-CM | POA: Insufficient documentation

## 2022-07-17 DIAGNOSIS — J45909 Unspecified asthma, uncomplicated: Secondary | ICD-10-CM | POA: Diagnosis not present

## 2022-07-17 DIAGNOSIS — R109 Unspecified abdominal pain: Secondary | ICD-10-CM | POA: Diagnosis present

## 2022-07-17 DIAGNOSIS — I129 Hypertensive chronic kidney disease with stage 1 through stage 4 chronic kidney disease, or unspecified chronic kidney disease: Secondary | ICD-10-CM | POA: Insufficient documentation

## 2022-07-17 DIAGNOSIS — K9401 Colostomy hemorrhage: Secondary | ICD-10-CM

## 2022-07-17 DIAGNOSIS — I2510 Atherosclerotic heart disease of native coronary artery without angina pectoris: Secondary | ICD-10-CM | POA: Diagnosis not present

## 2022-07-17 DIAGNOSIS — Z7902 Long term (current) use of antithrombotics/antiplatelets: Secondary | ICD-10-CM | POA: Insufficient documentation

## 2022-07-17 DIAGNOSIS — E1122 Type 2 diabetes mellitus with diabetic chronic kidney disease: Secondary | ICD-10-CM | POA: Diagnosis not present

## 2022-07-17 DIAGNOSIS — J449 Chronic obstructive pulmonary disease, unspecified: Secondary | ICD-10-CM | POA: Insufficient documentation

## 2022-07-17 DIAGNOSIS — Z794 Long term (current) use of insulin: Secondary | ICD-10-CM | POA: Insufficient documentation

## 2022-07-17 MED ORDER — SODIUM CHLORIDE 0.9 % IV BOLUS (SEPSIS)
1000.0000 mL | Freq: Once | INTRAVENOUS | Status: AC
  Administered 2022-07-18: 1000 mL via INTRAVENOUS

## 2022-07-17 MED ORDER — SODIUM CHLORIDE 0.9 % IV SOLN
1000.0000 mL | INTRAVENOUS | Status: DC
  Administered 2022-07-18: 1000 mL via INTRAVENOUS

## 2022-07-17 NOTE — ED Provider Notes (Signed)
[place] EMERGENCY DEPARTMENT AT [facility] Provider Note  CSN: [id number] Arrival date & time: 07/17/22 2152  Chief Complaint(s) Abdominal Pain  HPI Eddie Graham is a 88 y.o. male {Add pertinent medical, surgical, social history, OB history to HPI:1}    Abdominal Pain   Past Medical History Past Medical History:  Diagnosis Date   AAA (abdominal aortic aneurysm) ([place])    Anemia    Anxiety    Arthritis    Asthma    Back pain    Chronic kidney disease    COPD (chronic obstructive pulmonary disease) ([place])    Depression    Diabetes mellitus without complication ([place])    Dyspnea    Hypertension    Insomnia    Patient Active Problem List   Diagnosis Date Noted   Lower GI bleeding 06/12/2022   CAD (coronary artery disease) 06/12/2022   CKD (chronic kidney disease) stage 3, GFR 30-59 ml/min ([place]) 06/12/2022   Acute GI bleeding 06/12/2022   Lesion of urinary bladder 06/12/2022   AAA (abdominal aortic aneurysm) ([place]) 06/12/2022   Diverticulitis 06/12/2022   Diverticulitis of large intestine with abscess without bleeding 03/10/2022   Type 2 diabetes mellitus with complication, without long-term current use of insulin ([place]) 12/20/2021   Sigmoid diverticulitis 12/17/2021   Urethral stricture 12/09/2021   Bladder mass 09/30/2021   AAA (abdominal aortic aneurysm) without rupture ([place]) 08/05/2021   Home Medication(s) Prior to Admission medications   Medication Sig Start Date End Date Taking? Authorizing Provider  albuterol (VENTOLIN HFA) 108 (90 Base) MCG/ACT inhaler Inhale 2 puffs into the lungs every 4 (four) hours as needed for wheezing. 11/11/20   [provider]  Carboxymethylcellulose Sodium (EYE DROPS OP) Place 2 drops into both eyes as needed (dryness/itching).    [provider]  clopidogrel (PLAVIX) 75 MG tablet Take 1 tablet (75 mg total) by mouth daily. 06/19/22   Nita Sells, MD  diclofenac Sodium (VOLTAREN) 1 %  GEL Apply 2 g topically 4 (four) times daily. 12/20/21   Geradine Girt, DO  DULoxetine (CYMBALTA) 60 MG capsule Take 60 mg by mouth daily. 02/07/21   [provider]  gabapentin (NEURONTIN) 100 MG capsule Take 100 mg by mouth 3 (three) times daily. 02/25/21   [provider]  glipiZIDE (GLUCOTROL) 10 MG tablet Take 10 mg by mouth 2 (two) times daily before a meal. 03/25/21   [provider]  Insulin Pen Needle (TECHLITE PEN NEEDLES) 32G X 4 MM MISC Use to inject Lantus at bedtime as directed 06/15/22   Nita Sells, MD  LANTUS SOLOSTAR 100 UNIT/ML Solostar Pen Inject 15 Units into the skin at bedtime. 06/15/22   Nita Sells, MD  lidocaine (LIDODERM) 5 % Place 1 patch onto the skin daily. Remove & Discard patch within 12 hours or as directed by MD 11/01/21   Blue, Soijett A, PA-C  metoprolol succinate (TOPROL-XL) 25 MG 24 hr tablet Take 25 mg by mouth daily. 04/07/21   [provider]  montelukast (SINGULAIR) 10 MG tablet Take 10 mg by mouth daily. 03/19/21   [provider]  pantoprazole (PROTONIX) 40 MG tablet Take 1 tablet by mouth 2 times daily. 06/15/22   Nita Sells, MD  pioglitazone (ACTOS) 30 MG tablet Take 30 mg by mouth daily. 12/22/21   [provider]  rosuvastatin (CRESTOR) 40 MG tablet Take 40 mg by mouth daily. 04/07/21   [provider]  Semaglutide 14 MG TABS Take 14 mg by mouth  daily. 03/14/21   [provider]  senna (SENOKOT) 8.6 MG TABS tablet Take 1 tablet (8.6 mg total) by mouth daily. 03/13/22   Iona Beard, MD  tamsulosin (FLOMAX) 0.4 MG CAPS capsule Take 0.4 mg by mouth daily. Patient not taking: Reported on 12/05/2021 11/23/21   [provider]  traZODone (DESYREL) 50 MG tablet Take 50 mg by mouth daily. 03/14/21   [provider]  valsartan (DIOVAN) 80 MG tablet Take 80 mg by mouth at bedtime. 02/05/22   [provider]                                                                                                                                     Allergies Patient has no known allergies.  Review of Systems Review of Systems  Gastrointestinal:  Positive for abdominal pain.   As noted in HPI  Physical Exam Vital Signs  I have reviewed the triage vital signs BP 118/72 (BP Location: Right Arm)   Pulse 78   Temp (!) 97.5 F (36.4 C) (Oral)   Resp 18   SpO2 96%  *** Physical Exam  ED Results and Treatments Labs (all labs ordered are listed, but only abnormal results are displayed) Labs Reviewed - No data to display                                                                                                                       EKG  EKG Interpretation  Date/Time:    Ventricular Rate:    PR Interval:    QRS Duration:   QT Interval:    QTC Calculation:   R Axis:     Text Interpretation:         Radiology No results found.  Medications Ordered in ED Medications - No data to display  Procedures Procedures  (including critical care time)  Medical Decision Making / ED Course  Click here for ABCD2, HEART and other calculators  Medical Decision Making         Final Clinical Impression(s) / ED Diagnoses Final diagnoses:  None    {Document critical care time when appropriate:1}  {Document review of labs and clinical decision tools ie heart score, Chads2Vasc2 etc:1}  {Document your independent review of radiology images, and any outside records:1} {Document your discussion with family members, caretakers, and with consultants:1} {Document social determinants of health affecting pt's care:1} {Document your decision making why or why not admission, treatments were needed:1} This chart was dictated using voice recognition software.  Despite best efforts to proofread,  errors can  occur which can change the documentation meaning.

## 2022-07-17 NOTE — ED Triage Notes (Addendum)
Pt. BIB [facility] for abdominal pain. Pt. Started having pain about 2 hrs ago. When pt. Went to change his ostomy, he notice that there was blood in it, so he called EMS. Pt. Is CAOx4 and spanish speaking. VSS with EMS.

## 2022-07-18 ENCOUNTER — Emergency Department (HOSPITAL_COMMUNITY): Payer: 59

## 2022-07-18 DIAGNOSIS — K9401 Colostomy hemorrhage: Secondary | ICD-10-CM | POA: Diagnosis not present

## 2022-07-18 LAB — I-STAT CHEM 8, ED
BUN: 31 mg/dL — ABNORMAL HIGH (ref 8–23)
Calcium, Ion: 1.16 mmol/L (ref 1.15–1.40)
Chloride: 103 mmol/L (ref 98–111)
Creatinine, Ser: 1.6 mg/dL — ABNORMAL HIGH (ref 0.61–1.24)
Glucose, Bld: 136 mg/dL — ABNORMAL HIGH (ref 70–99)
HCT: 30 % — ABNORMAL LOW (ref 39.0–52.0)
Hemoglobin: 10.2 g/dL — ABNORMAL LOW (ref 13.0–17.0)
Potassium: 3.8 mmol/L (ref 3.5–5.1)
Sodium: 140 mmol/L (ref 135–145)
TCO2: 26 mmol/L (ref 22–32)

## 2022-07-18 LAB — CBC WITH DIFFERENTIAL/PLATELET
Abs Immature Granulocytes: 0.04 10*3/uL (ref 0.00–0.07)
Basophils Absolute: 0 10*3/uL (ref 0.0–0.1)
Basophils Relative: 0 %
Eosinophils Absolute: 0.4 10*3/uL (ref 0.0–0.5)
Eosinophils Relative: 4 %
HCT: 35.2 % — ABNORMAL LOW (ref 39.0–52.0)
Hemoglobin: 10.7 g/dL — ABNORMAL LOW (ref 13.0–17.0)
Immature Granulocytes: 0 %
Lymphocytes Relative: 21 %
Lymphs Abs: 2 10*3/uL (ref 0.7–4.0)
MCH: 23.3 pg — ABNORMAL LOW (ref 26.0–34.0)
MCHC: 30.4 g/dL (ref 30.0–36.0)
MCV: 76.7 fL — ABNORMAL LOW (ref 80.0–100.0)
Monocytes Absolute: 0.6 10*3/uL (ref 0.1–1.0)
Monocytes Relative: 6 %
Neutro Abs: 6.4 10*3/uL (ref 1.7–7.7)
Neutrophils Relative %: 69 %
Platelets: 270 10*3/uL (ref 150–400)
RBC: 4.59 MIL/uL (ref 4.22–5.81)
RDW: 16.3 % — ABNORMAL HIGH (ref 11.5–15.5)
WBC: 9.5 10*3/uL (ref 4.0–10.5)
nRBC: 0 % (ref 0.0–0.2)

## 2022-07-18 LAB — COMPREHENSIVE METABOLIC PANEL
ALT: 14 U/L (ref 0–44)
AST: 14 U/L — ABNORMAL LOW (ref 15–41)
Albumin: 3.4 g/dL — ABNORMAL LOW (ref 3.5–5.0)
Alkaline Phosphatase: 77 U/L (ref 38–126)
Anion gap: 8 (ref 5–15)
BUN: 37 mg/dL — ABNORMAL HIGH (ref 8–23)
CO2: 24 mmol/L (ref 22–32)
Calcium: 8.6 mg/dL — ABNORMAL LOW (ref 8.9–10.3)
Chloride: 105 mmol/L (ref 98–111)
Creatinine, Ser: 1.46 mg/dL — ABNORMAL HIGH (ref 0.61–1.24)
GFR, Estimated: 47 mL/min — ABNORMAL LOW (ref >60)
Glucose, Bld: 148 mg/dL — ABNORMAL HIGH (ref 70–99)
Potassium: 3.9 mmol/L (ref 3.5–5.1)
Sodium: 137 mmol/L (ref 135–145)
Total Bilirubin: 0.7 mg/dL (ref 0.3–1.2)
Total Protein: 6.9 g/dL (ref 6.5–8.1)

## 2022-07-18 LAB — URINALYSIS, ROUTINE W REFLEX MICROSCOPIC
Bilirubin Urine: NEGATIVE
Glucose, UA: NEGATIVE mg/dL
Hgb urine dipstick: NEGATIVE
Ketones, ur: NEGATIVE mg/dL
Leukocytes,Ua: NEGATIVE
Nitrite: NEGATIVE
Protein, ur: NEGATIVE mg/dL
Specific Gravity, Urine: 1.036 — ABNORMAL HIGH (ref 1.005–1.030)
pH: 6 (ref 5.0–8.0)

## 2022-07-18 LAB — BLOOD GAS, VENOUS
Acid-base deficit: 0.5 mmol/L (ref 0.0–2.0)
Bicarbonate: 24.2 mmol/L (ref 20.0–28.0)
O2 Saturation: 91.1 %
Patient temperature: 37
pCO2, Ven: 39 mmHg — ABNORMAL LOW (ref 44–60)
pH, Ven: 7.4 (ref 7.25–7.43)
pO2, Ven: 57 mmHg — ABNORMAL HIGH (ref 32–45)

## 2022-07-18 LAB — CBG MONITORING, ED: Glucose-Capillary: 139 mg/dL — ABNORMAL HIGH (ref 70–99)

## 2022-07-18 LAB — BETA-HYDROXYBUTYRIC ACID: Beta-Hydroxybutyric Acid: 0.11 mmol/L (ref 0.05–0.27)

## 2022-07-18 LAB — LIPASE, BLOOD: Lipase: 38 U/L (ref 11–51)

## 2022-07-18 MED ORDER — SODIUM CHLORIDE (PF) 0.9 % IJ SOLN
INTRAMUSCULAR | Status: AC
  Filled 2022-07-18: qty 50

## 2022-07-18 MED ORDER — IOHEXOL 350 MG/ML SOLN
80.0000 mL | Freq: Once | INTRAVENOUS | Status: AC | PRN
  Administered 2022-07-18: 80 mL via INTRAVENOUS

## 2022-09-28 ENCOUNTER — Other Ambulatory Visit (HOSPITAL_COMMUNITY): Payer: Self-pay

## 2022-12-19 ENCOUNTER — Other Ambulatory Visit (HOSPITAL_COMMUNITY): Payer: Self-pay

## 2022-12-23 IMAGING — CT CT ABD-PELV W/O CM
2 of 4 series · 15 of 46 positions shown, 17 images · non-contrast
Comparison: None.

CLINICAL DATA: Blunt abdominal trauma.  Fell.



[Series 2: axial st · axial · 0.83mm/px · z∈[-450,-15]mm · 12 of 99 slices shown, 14 images]
[im 6/99  soft-tissue]
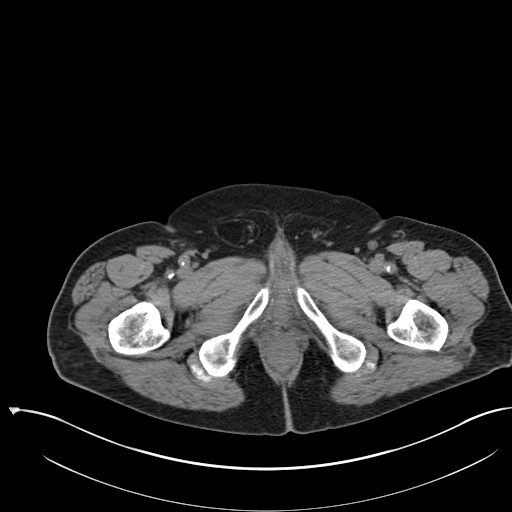
[im 6/99  bone]
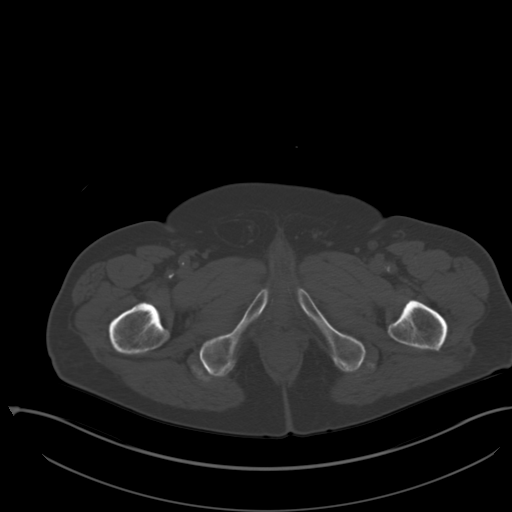
[im 16/99  soft-tissue]
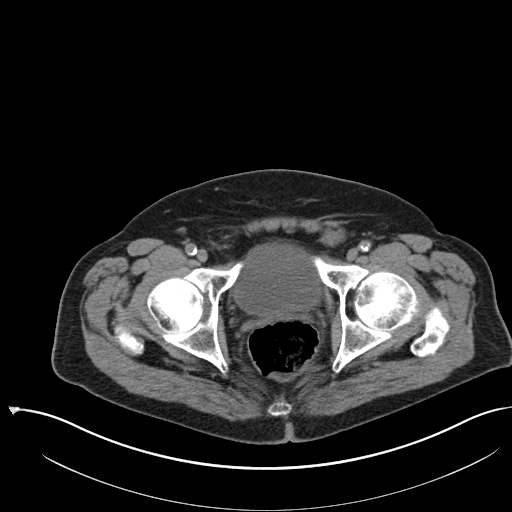
[im 21/99  soft-tissue]
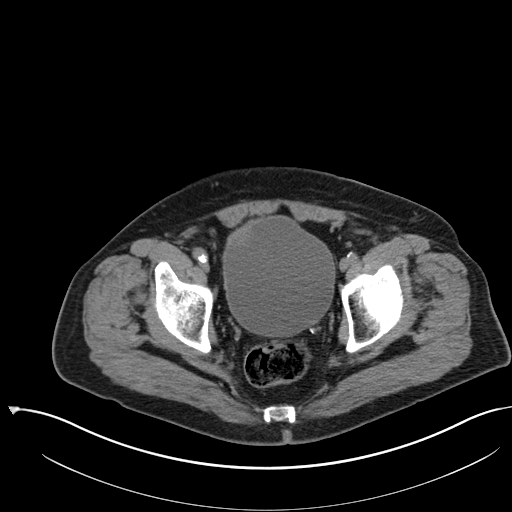
[im 31/99  soft-tissue]
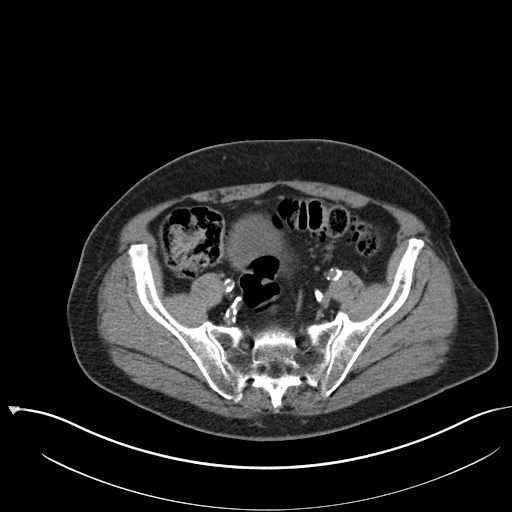
[im 37/99  soft-tissue]
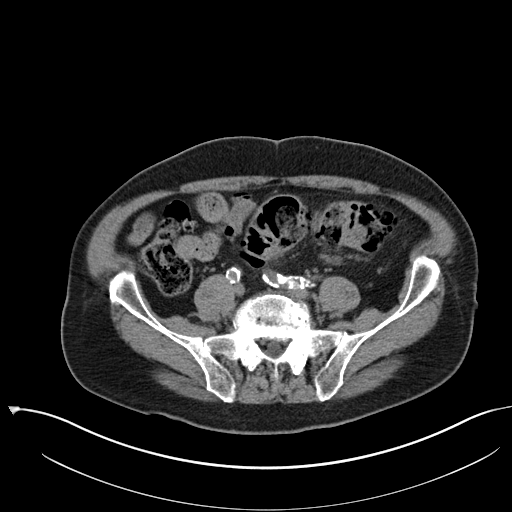
[im 47/99  soft-tissue]
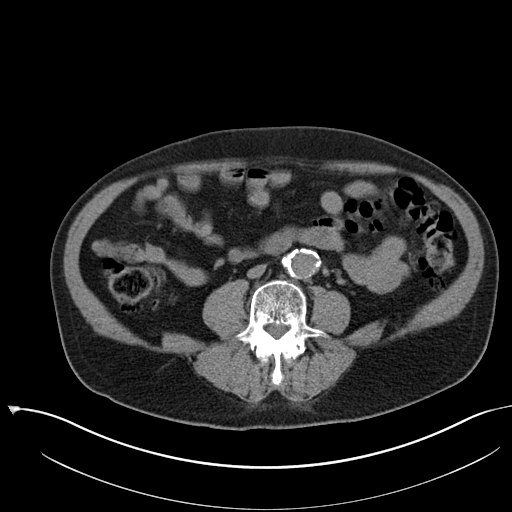
[im 52/99  soft-tissue]
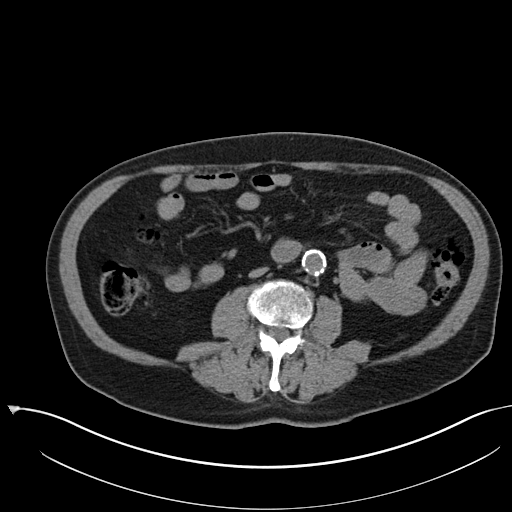
[im 62/99  soft-tissue]
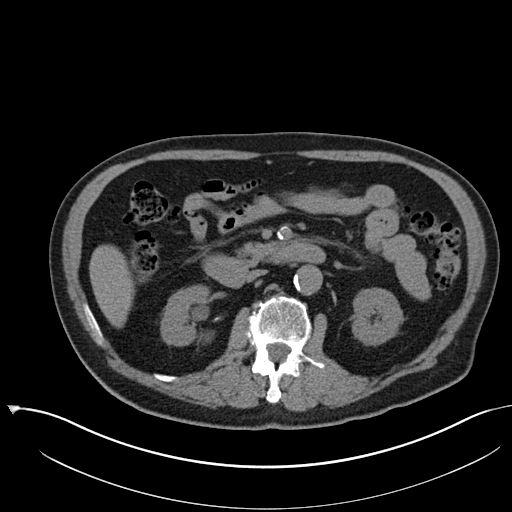
[im 68/99  soft-tissue]
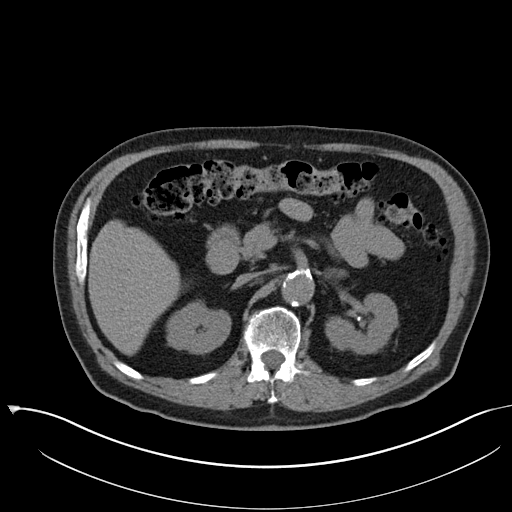
[im 68/99  bone]
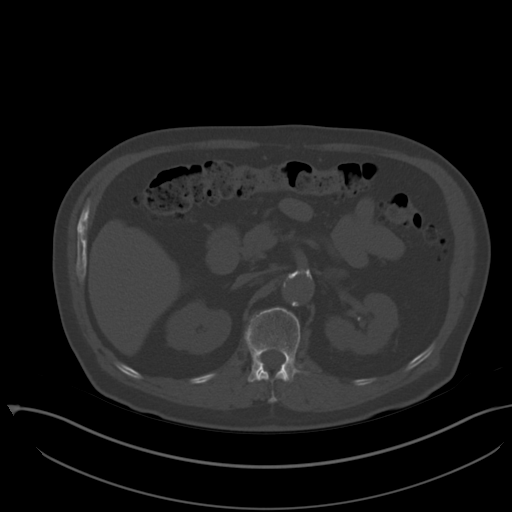
[im 78/99  soft-tissue]
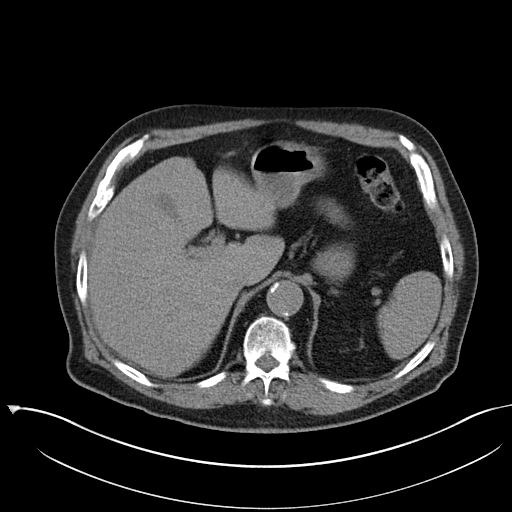
[im 83/99  soft-tissue]
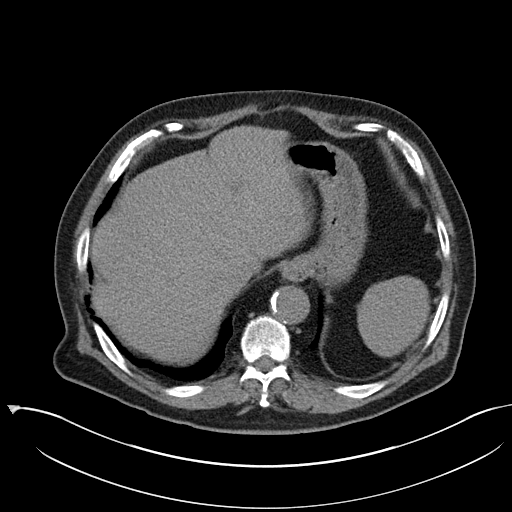
[im 93/99  soft-tissue]
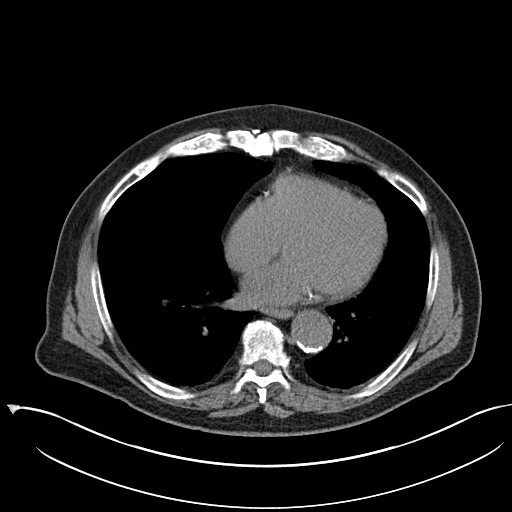

[Series 5: coronal st · coronal · 0.80mm/px · 3 of 142 slices shown]
[im 48/142  soft-tissue]
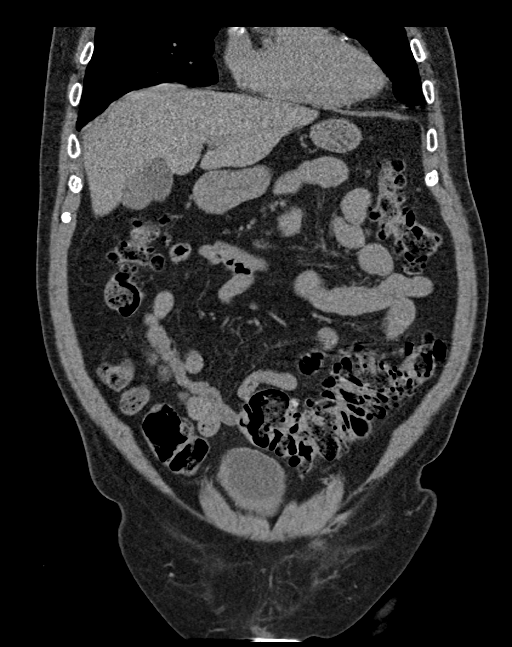
[im 63/142  soft-tissue]
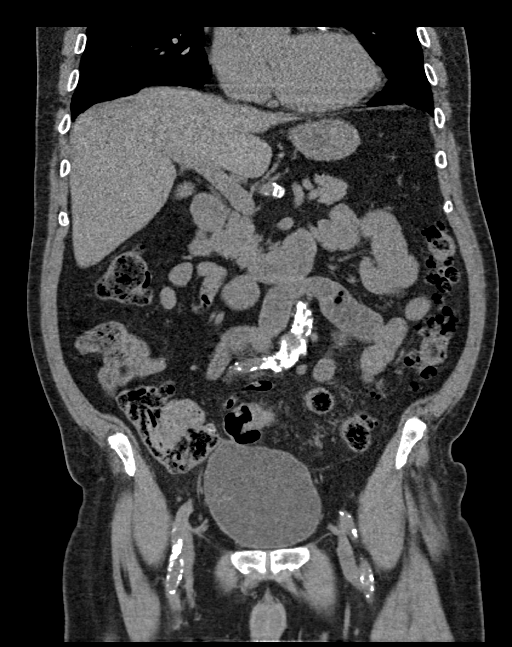
[im 79/142  soft-tissue]
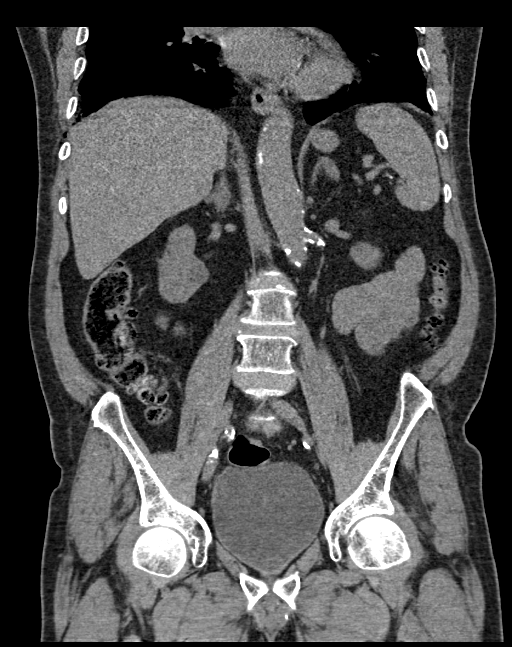

[15 of 46 positions shown; findings below may reference images not displayed]

FINDINGS: Lower chest: The heart is within normal limits in size for age. No
pericardial effusion. Advanced aortic and coronary artery
calcifications. The distal esophagus is grossly normal.

Hepatobiliary: No evidence of acute hepatic injury. No perihepatic
fluid collection. The gallbladder is unremarkable. No common bile
duct dilatation.

Pancreas: No evidence of acute pancreatic injury. No peripancreatic
fluid collection. No pancreatic Mass.

Spleen: No findings for acute splenic injury. No perisplenic fluid
collection.

Adrenals/Urinary Tract: The adrenal glands and kidneys are intact.
No acute injury or perinephric fluid collection. Renal vascular
calcifications. No hydroureteronephrosis.

There is a 2.7 cm soft tissue mass associated with right lateral
wall worrisome for bladder cancer. Recommend urology consultation
and cystoscopic evaluation.

Stomach/Bowel: The stomach, duodenum, small bowel and colon are
grossly normal. No acute inflammatory process, mass lesions or
obstructive findings. Significant diffuse colonic diverticulosis
without findings for acute diverticulitis. The terminal ileum and
appendix are normal.

Vascular/Lymphatic: Advanced atherosclerotic calcifications
involving the aorta and iliac arteries. There is a saccular aneurysm
associated with the right-side of the distal aorta. This measures
3.2 cm. Recommend referral to a vascular specialist. This
recommendation follows ACR consensus guidelines: White Paper of the
ACR Incidental Findings Committee II on Vascular Findings. [HOSPITAL] 9379; [DATE].

No mesenteric or retroperitoneal mass or adenopathy the.

Reproductive: TURP changes involving the prostate gland. The seminal
vesicles are unremarkable.

Other: No pelvic lymphadenopathy or free pelvic fluid collections.
Right inguinal hernia containing fat is noted.

Musculoskeletal: No significant bony findings. Age related
degenerative changes involving the spine.
IMPRESSION: 1. No acute abdominal or pelvic findings.
2. 2.7 cm soft tissue mass associated with the right lateral wall of
the bladder worrisome for bladder neoplasm. Recommend Urology
consultation and cystoscopic evaluation.
3. Advanced atherosclerotic calcifications involving the aorta and
iliac arteries with a 3.2 cm saccular aneurysm associated with the
right-side of the distal aorta. Recommend referral to a vascular
specialist.
4. Significant diffuse colonic diverticulosis without findings for
acute diverticulitis.

Aortic Atherosclerosis ([id number]-WKI.I).

## 2023-01-04 ENCOUNTER — Other Ambulatory Visit: Payer: Self-pay | Admitting: Urology

## 2023-01-19 NOTE — Patient Instructions (Addendum)
SURGICAL WAITING ROOM VISITATION  Patients having surgery or a procedure may have no more than 2 support people in the waiting area - these visitors may rotate.    Children under the age of 62 must have an adult with them who is not the patient.  Due to an increase in RSV and influenza rates and associated hospitalizations, children ages 11 and under may not visit patients in [facility] hospitals.  If the patient needs to stay at the hospital during part of their recovery, the visitor guidelines for inpatient rooms apply. Pre-op nurse will coordinate an appropriate time for 1 support person to accompany patient in pre-op.  This support person may not rotate.    Please refer to the [facility] website for the visitor guidelines for Inpatients (after your surgery is over and you are in a regular room).       Your procedure is scheduled on: 01/26/23   Report to [facility] Main Entrance    Report to admitting at [facility]   Call this number if you have problems the morning of surgery [phone]   Do not eat food or drink liquids :After Midnight.      Oral Hygiene is also important to reduce your risk of infection.                                    Remember - BRUSH YOUR TEETH THE MORNING OF SURGERY WITH YOUR REGULAR TOOTHPASTE  DENTURES WILL BE REMOVED PRIOR TO SURGERY PLEASE DO NOT APPLY "Poly grip" OR ADHESIVES!!!   Stop all vitamins and herbal supplements 7 days before surgery.   Take these medicines the morning of surgery with A SIP OF WATER: Cymbalta, Gabapentin, Metoprolol, Singulair, Pantoprazole, Rosuvastatin  DO NOT TAKE ANY ORAL DIABETIC MEDICATIONS DAY OF YOUR SURGERY The day before surgery Hold evening dose of Glipizide. The day of surgery hold Glipizide Hold Actos the day of surgery.  Hold Semaglutide the day of surgery. Take only half of bedtime insulin the night before surgery.             You may not have any metal on your body including hair pins, jewelry,  and body piercing             Do not wear make-up, lotions, powders, perfumes/cologne, or deodorant              Men may shave face and neck.   Do not bring valuables to the hospital. [facility] IS NOT             RESPONSIBLE   FOR VALUABLES.   Contacts, glasses, dentures or bridgework may not be worn into surgery.  DO NOT BRING YOUR HOME MEDICATIONS TO THE HOSPITAL. PHARMACY WILL DISPENSE MEDICATIONS LISTED ON YOUR MEDICATION LIST TO YOU DURING YOUR ADMISSION IN THE HOSPITAL!    Patients discharged on the day of surgery will not be allowed to drive home.  Someone NEEDS to stay with you for the first 24 hours after anesthesia.   Special Instructions: Bring a copy of your healthcare power of attorney and living will documents the day of surgery if you haven't scanned them before.              Please read over the following fact sheets you were given: IF YOU HAVE QUESTIONS ABOUT YOUR PRE-OP INSTRUCTIONS PLEASE CALL [phone] Rosey Bath   If you received a  COVID test during your pre-op visit  it is requested that you wear a mask when out in public, stay away from anyone that may not be feeling well and notify your surgeon if you develop symptoms. If you test positive for Covid or have been in contact with anyone that has tested positive in the last 10 days please notify you surgeon.    [facility] - Preparing for Surgery Before surgery, you can play an important role.  Because skin is not sterile, your skin needs to be as free of germs as possible.  You can reduce the number of germs on your skin by washing with CHG (chlorahexidine gluconate) soap before surgery.  CHG is an antiseptic cleaner which kills germs and bonds with the skin to continue killing germs even after washing. Please DO NOT use if you have an allergy to CHG or antibacterial soaps.  If your skin becomes reddened/irritated stop using the CHG and inform your nurse when you arrive at Short Stay. Do not shave (including legs and  underarms) for at least 48 hours prior to the first CHG shower.  You may shave your face/neck.  Please follow these instructions carefully:  1.  Shower with CHG Soap the night before surgery and the  morning of surgery.  2.  If you choose to wash your hair, wash your hair first as usual with your normal  shampoo.  3.  After you shampoo, rinse your hair and body thoroughly to remove the shampoo.                             4.  Use CHG as you would any other liquid soap.  You can apply chg directly to the skin and wash.  Gently with a scrungie or clean washcloth.  5.  Apply the CHG Soap to your body ONLY FROM THE NECK DOWN.   Do   not use on face/ open                           Wound or open sores. Avoid contact with eyes, ears mouth and   genitals (private parts).                       Wash face,  Genitals (private parts) with your normal soap.             6.  Wash thoroughly, paying special attention to the area where your    surgery  will be performed.  7.  Thoroughly rinse your body with warm water from the neck down.  8.  DO NOT shower/wash with your normal soap after using and rinsing off the CHG Soap.                9.  Pat yourself dry with a clean towel.            10.  Wear clean pajamas.            11.  Place clean sheets on your bed the night of your first shower and do not  sleep with pets. Day of Surgery : Do not apply any lotions/deodorants the morning of surgery.  Please wear clean clothes to the hospital/surgery center.  FAILURE TO FOLLOW THESE INSTRUCTIONS MAY RESULT IN THE CANCELLATION OF YOUR SURGERY  PATIENT SIGNATURE_________________________________  NURSE SIGNATURE__________________________________  ________________________________________________________________________How to Manage Your Diabetes Before and  After Surgery  Why is it important to control my blood sugar before and after surgery? Improving blood sugar levels before and after surgery helps healing and  can limit problems. A way of improving blood sugar control is eating a healthy diet by:  Eating less sugar and carbohydrates  Increasing activity/exercise  Talking with your doctor about reaching your blood sugar goals High blood sugars (greater than 180 mg/dL) can raise your risk of infections and slow your recovery, so you will need to focus on controlling your diabetes during the weeks before surgery. Make sure that the doctor who takes care of your diabetes knows about your planned surgery including the date and location.  How do I manage my blood sugar before surgery? Check your blood sugar at least 4 times a day, starting 2 days before surgery, to make sure that the level is not too high or low. Check your blood sugar the morning of your surgery when you wake up and every 2 hours until you get to the Short Stay unit. If your blood sugar is less than 70 mg/dL, you will need to treat for low blood sugar: Do not take insulin. Treat a low blood sugar (less than 70 mg/dL) with  cup of clear juice (cranberry or apple), 4 glucose tablets, OR glucose gel. Recheck blood sugar in 15 minutes after treatment (to make sure it is greater than 70 mg/dL). If your blood sugar is not greater than 70 mg/dL on recheck, call [phone] for further instructions. Report your blood sugar to the short stay nurse when you get to Short Stay.  If you are admitted to the hospital after surgery: Your blood sugar will be checked by the staff and you will probably be given insulin after surgery (instead of oral diabetes medicines) to make sure you have good blood sugar levels. The goal for blood sugar control after surgery is 80-180 mg/dL.    Patient Signature:  Date:   Nurse Signature:  Date:   Reviewed and Endorsed by [facility] Patient Education Committee, August 2015

## 2023-01-19 NOTE — Progress Notes (Signed)
COVID Vaccine received:  [x]  No [x]  Yes Date of any COVID positive Test in last 90 days: no PCP - Dennis Bast MD Cardiologist -   Chest x-ray - 03/10/22 Epic EKG -  03/16/22 Epic Stress Test -  ECHO -  Cardiac Cath -   Bowel Prep - [x]  No  []   Yes ______  Pacemaker / ICD device [x]  No []  Yes   Spinal Cord Stimulator:[x]  No []  Yes       History of Sleep Apnea? []  No [x]  Yes   CPAP used?- [x]  No []  Yes    Does the patient monitor blood sugar?          [x]  No []  Yes  []  N/A  Patient has: []  NO Hx DM   []  Pre-DM                 []  DM1  [x]   DM2 Does patient have a [employer] or Dexacom? []  No []  Yes   Fasting Blood Sugar Ranges-  Checks Blood Sugar _____ times a day  GLP1 agonist / usual dose - no GLP1 instructions:  SGLT-2 inhibitors / usual dose - no SGLT-2 instructions:   Blood Thinner / Instructions:no Aspirin Instructions:no  Comments: Instucted pt. To hold Actos, Rebelsus and Glipizide the day of surgery. Only take half of Lantus the night before surgery which will be 12 units.  Activity level: Patient is  unable to climb a flight of stairs without difficulty; [x]  No CP  but would have _SOB__   Patient can perform ADLs without assistance.   Anesthesia review: AAA, DM2, CKD3, COPD, CAD-CABG in 2011  Patient denies shortness of breath, fever, cough and chest pain at PAT appointment.  Patient verbalized understanding and agreement to the Pre-Surgical Instructions that were given to them at this PAT appointment. Patient was also educated of the need to review these PAT instructions again prior to his/her surgery.I reviewed the appropriate phone numbers to call if they have any and questions or concerns.

## 2023-01-20 ENCOUNTER — Encounter (HOSPITAL_COMMUNITY)
Admission: RE | Admit: 2023-01-20 | Discharge: 2023-01-20 | Disposition: A | Discharge status: Home / Self Care | Payer: 59 | Source: Ambulatory Visit | Attending: Urology | Admitting: Urology

## 2023-01-20 ENCOUNTER — Other Ambulatory Visit: Payer: Self-pay

## 2023-01-20 ENCOUNTER — Encounter (HOSPITAL_COMMUNITY): Payer: Self-pay

## 2023-01-20 VITALS — BP 124/58 | HR 62 | Temp 97.7°F | Resp 18 | Ht 66.0 in | Wt 200.0 lb

## 2023-01-20 DIAGNOSIS — Z01812 Encounter for preprocedural laboratory examination: Secondary | ICD-10-CM | POA: Diagnosis present

## 2023-01-20 DIAGNOSIS — Z01818 Encounter for other preprocedural examination: Secondary | ICD-10-CM

## 2023-01-20 DIAGNOSIS — E119 Type 2 diabetes mellitus without complications: Secondary | ICD-10-CM

## 2023-01-20 HISTORY — DX: Malignant (primary) neoplasm, unspecified: C80.1

## 2023-01-20 LAB — CBC
HCT: 43.7 % (ref 39.0–52.0)
Hemoglobin: 13.8 g/dL (ref 13.0–17.0)
MCH: 29.1 pg (ref 26.0–34.0)
MCHC: 31.6 g/dL (ref 30.0–36.0)
MCV: 92 fL (ref 80.0–100.0)
Platelets: 152 10*3/uL (ref 150–400)
RBC: 4.75 MIL/uL (ref 4.22–5.81)
RDW: 15.8 % — ABNORMAL HIGH (ref 11.5–15.5)
WBC: 8.4 10*3/uL (ref 4.0–10.5)
nRBC: 0 % (ref 0.0–0.2)

## 2023-01-20 LAB — BASIC METABOLIC PANEL
Anion gap: 10 (ref 5–15)
BUN: 29 mg/dL — ABNORMAL HIGH (ref 8–23)
CO2: 26 mmol/L (ref 22–32)
Calcium: 8.8 mg/dL — ABNORMAL LOW (ref 8.9–10.3)
Chloride: 102 mmol/L (ref 98–111)
Creatinine, Ser: 1.56 mg/dL — ABNORMAL HIGH (ref 0.61–1.24)
GFR, Estimated: 43 mL/min — ABNORMAL LOW (ref >60)
Glucose, Bld: 90 mg/dL (ref 70–99)
Potassium: 3.9 mmol/L (ref 3.5–5.1)
Sodium: 138 mmol/L (ref 135–145)

## 2023-01-20 LAB — HEMOGLOBIN A1C
Hgb A1c MFr Bld: 7.4 % — ABNORMAL HIGH (ref 4.8–5.6)
Mean Plasma Glucose: 165.68 mg/dL

## 2023-01-20 NOTE — Progress Notes (Signed)
1500: Pt's interview done with pt. And his friend Clovis Fredrickson. When pt. Did not understand something Para March would translate it to [place].  His friend/support person did ask some questions about his meds. Which were answered. Also preop instructions given to the pt. Written in [place] and [place].

## 2023-01-21 ENCOUNTER — Ambulatory Visit (HOSPITAL_COMMUNITY): Payer: 59 | Admitting: Anesthesiology

## 2023-01-21 ENCOUNTER — Ambulatory Visit (HOSPITAL_COMMUNITY): Payer: 59 | Admitting: Physician Assistant

## 2023-01-22 NOTE — H&P (Signed)
CC/HPI: cc: Bladder cancer, urethral stricture     Per care everywhere in Epic, pt last saw Dr. Logan Bores at [facility] in July 2020  Pt s/p TURBT 02-15-18 with foley placed at the end of the case. He passed voiding trial. Path was Ta LG.  09/2021 TURBT high-grade Ta recurrence    10/08/2021: 88 year old man with a history of low-grade TA bladder cancer diagnosed in 2019 who presented to [facility] urology with gross hematuria and imaging concerning for CT scan. Patient had bulbar urethral stricture and bladder was not accessible with flexible cystoscope in the office. He underwent cystoscopy with urethral balloon dilation and TURBT of large bladder tumor. He is here today for void trial. Pathology shows HG Ta bladder cancer.   10/27/2021: Here today for evaluation of pain with voiding. Symptoms present for 1 week. Also associated with painful ability to start his stream and increased frequency/urgency. He denies gross hematuria or correlating lower back or flank pain/discomfort suggestive of obstructive uropathy. Of note patient has a history of underlying diabetes. This is poorly controlled, last A1c was greater than 13.   11/21/2021: 88 year old man with history of high-grade Ta bladder cancer with last recurrence June 2023 now with 2 episodes of gross hematuria. Patient also noted to have bulbar urethral stricture that required dilation in the operating room at time of resection.   12/12/2021: He presents for voiding trial today after he underwent Optilume 3 days ago. He is ready to have the catheter removed.   01/01/2023: 88 year old man with a history of bladder cancer and urethral stricture s/p Optilume in August 2023 here for surveillance cystoscopy. He was seen a few months ago for gross hematuria by the on-call physician. Today he has microscopic hematuria. He has got some mild dysuria.     ALLERGIES: No Known Allergies    MEDICATIONS: Plavix 75 mg tablet  Insulin Aspart  Tylenol     GU PSH:  Cysto Dilate Stricture (M or F) - 12/09/2021, 09/30/2021 Cystoscopy - 11/21/2021, 08/04/2021 Cystoscopy TURBT >5 cm - 09/30/2021     NON-GU PSH: No Non-GU PSH    GU PMH: Gross hematuria - 09/28/2022, - 11/21/2021 Bladder Cancer Lateral - 02/20/2022, - 11/21/2021, - 10/08/2021 Bulbar urethral stricture - 02/20/2022, - 12/12/2021, - 11/21/2021, - 10/08/2021, - 09/24/2021, - 08/04/2021 Dysuria - 10/27/2021 Urinary Urgency - 10/27/2021 Bladder tumor/neoplasm - 09/24/2021, - 08/04/2021      PMH Notes: Diabetes   NON-GU PMH: Arthritis Asthma Hypertension    FAMILY HISTORY: No Family History    SOCIAL HISTORY: Marital Status: Married Preferred Language: Spanish; Castilian; Ethnicity:  Current Smoking Status: Patient does not smoke anymore.   Tobacco Use Assessment Completed: Used Tobacco in last 30 days? Drinks 2 caffeinated drinks per day.    REVIEW OF SYSTEMS:    GU Review Male:   Patient denies frequent urination, hard to postpone urination, burning/ pain with urination, get up at night to urinate, leakage of urine, stream starts and stops, trouble starting your stream, have to strain to urinate , erection problems, and penile pain.  Gastrointestinal (Upper):   Patient denies nausea, vomiting, and indigestion/ heartburn.  Gastrointestinal (Lower):   Patient denies diarrhea and constipation.  Constitutional:   Patient denies fever, night sweats, weight loss, and fatigue.  Skin:   Patient denies skin rash/ lesion and itching.  Eyes:   Patient denies blurred vision and double vision.  Ears/ Nose/ Throat:   Patient denies sore throat and sinus problems.  Hematologic/Lymphatic:   Patient denies  swollen glands and easy bruising.  Cardiovascular:   Patient denies leg swelling and chest pains.  Respiratory:   Patient denies cough and shortness of breath.  Endocrine:   Patient denies excessive thirst.  Musculoskeletal:   Patient denies back pain and joint pain.  Neurological:   Patient denies headaches and  dizziness.  Psychologic:   Patient denies depression and anxiety.   VITAL SIGNS: None   MULTI-SYSTEM PHYSICAL EXAMINATION:    Constitutional: Well-nourished. No physical deformities. Normally developed. Good grooming.  Neck: Neck symmetrical, not swollen. Normal tracheal position.  Respiratory: No labored breathing, no use of accessory muscles.   Skin: No paleness, no jaundice, no cyanosis. No lesion, no ulcer, no rash.  Neurologic / Psychiatric: Oriented to time, oriented to place, oriented to person. No depression, no anxiety, no agitation.  Eyes: Normal conjunctivae. Normal eyelids.  Ears, Nose, Mouth, and Throat: Left ear no scars, no lesions, no masses. Right ear no scars, no lesions, no masses. Nose no scars, no lesions, no masses. Normal hearing. Normal lips.  Musculoskeletal: Normal gait and station of head and neck.     Complexity of Data:  Records Review:   Previous Patient Records, POC Tool  Urine Test Review:   Urinalysis   PROCEDURES:         Flexible Cystoscopy - 52000  Risks, benefits, and some of the potential complications of the procedure were discussed at length with the patient including infection, bleeding, voiding discomfort, urinary retention, fever, chills, sepsis, and others. All questions were answered. Informed consent was obtained. Sterile technique and intraurethral analgesia were used.  Meatus:  Normal size. Normal location. Normal condition.  Urethra:  Moderate bulbous stricture unable to traverse cystoscope      The lower urinary tract was carefully examined until the stricture was encountered in the bulbar urethra. The procedure was well-tolerated and without complications. Antibiotic instructions were given. Instructions were given to call the office immediately for bloody urine, difficulty urinating, urinary retention, painful or frequent urination, fever, chills, nausea, vomiting or other illness. The patient stated that he understood these instructions  and would comply with them.         Urinalysis w/Scope Dipstick Dipstick Cont'd Micro  Color: Yellow Bilirubin: Neg mg/dL WBC/hpf: 0 - 5/hpf  Appearance: Clear Ketones: Neg mg/dL RBC/hpf: 3 - 60/AVW  Specific Gravity: 1.020 Blood: 2+ ery/uL Bacteria: Rare (0-9/hpf)  pH: 6.0 Protein: Trace mg/dL Cystals: NS (Not Seen)  Glucose: Neg mg/dL Urobilinogen: 0.2 mg/dL Casts: NS (Not Seen)    Nitrites: Neg Trichomonas: Not Present    Leukocyte Esterase: Neg leu/uL Mucous: Not Present      Epithelial Cells: NS (Not Seen)      Yeast: NS (Not Seen)      Sperm: Not Present    ASSESSMENT:      ICD-10 Details  1 GU:   Bladder Cancer Lateral - C67.2 Chronic, Stable  2   Bulbar urethral stricture - N35.011 Chronic, Stable  3   Gross hematuria - R31.0 Undiagnosed New Problem   PLAN:           Orders Labs CULTURE, URINE          Document Letter(s):  Created for Patient: Clinical Summary         Notes:   Bladder cancer:  -Patient is overdue for surveillance cystoscopy however him unable to traverse the scope through the urethral stricture. We discussed proceeding with diagnostic cystoscopy and possible TURBT in the operating room at same  time as urethral dilation. Risks and benefits of the procedure discussed with the patient and he has elected to proceed. These risks include but are not limited to pain, bleeding, infection, damage during fracture, need for additional treatment, bladder perforation, need for Foley catheter.   Bulbar urethral stricture:  -Patient with urethral stricture recurrence. We discussed repeating a urethral dilation in the operating room and he was agreeable. He will have a Foley catheter for at least 3 days following the procedure.

## 2023-01-22 NOTE — H&P (View-Only) (Signed)
CC/HPI: cc: Bladder cancer, urethral stricture     Per care everywhere in Epic, pt last saw Dr. Logan Bores at [facility] in July 2020  Pt s/p TURBT 02-15-18 with foley placed at the end of the case. He passed voiding trial. Path was Ta LG.  09/2021 TURBT high-grade Ta recurrence    10/08/2021: 88 year old man with a history of low-grade TA bladder cancer diagnosed in 2019 who presented to [facility] urology with gross hematuria and imaging concerning for CT scan. Patient had bulbar urethral stricture and bladder was not accessible with flexible cystoscope in the office. He underwent cystoscopy with urethral balloon dilation and TURBT of large bladder tumor. He is here today for void trial. Pathology shows HG Ta bladder cancer.   10/27/2021: Here today for evaluation of pain with voiding. Symptoms present for 1 week. Also associated with painful ability to start his stream and increased frequency/urgency. He denies gross hematuria or correlating lower back or flank pain/discomfort suggestive of obstructive uropathy. Of note patient has a history of underlying diabetes. This is poorly controlled, last A1c was greater than 13.   11/21/2021: 88 year old man with history of high-grade Ta bladder cancer with last recurrence June 2023 now with 2 episodes of gross hematuria. Patient also noted to have bulbar urethral stricture that required dilation in the operating room at time of resection.   12/12/2021: He presents for voiding trial today after he underwent Optilume 3 days ago. He is ready to have the catheter removed.   01/01/2023: 88 year old man with a history of bladder cancer and urethral stricture s/p Optilume in August 2023 here for surveillance cystoscopy. He was seen a few months ago for gross hematuria by the on-call physician. Today he has microscopic hematuria. He has got some mild dysuria.     ALLERGIES: No Known Allergies    MEDICATIONS: Plavix 75 mg tablet  Insulin Aspart  Tylenol     GU PSH:  Cysto Dilate Stricture (M or F) - 12/09/2021, 09/30/2021 Cystoscopy - 11/21/2021, 08/04/2021 Cystoscopy TURBT >5 cm - 09/30/2021     NON-GU PSH: No Non-GU PSH    GU PMH: Gross hematuria - 09/28/2022, - 11/21/2021 Bladder Cancer Lateral - 02/20/2022, - 11/21/2021, - 10/08/2021 Bulbar urethral stricture - 02/20/2022, - 12/12/2021, - 11/21/2021, - 10/08/2021, - 09/24/2021, - 08/04/2021 Dysuria - 10/27/2021 Urinary Urgency - 10/27/2021 Bladder tumor/neoplasm - 09/24/2021, - 08/04/2021      PMH Notes: Diabetes   NON-GU PMH: Arthritis Asthma Hypertension    FAMILY HISTORY: No Family History    SOCIAL HISTORY: Marital Status: Married Preferred Language: Spanish; Castilian; Ethnicity:  Current Smoking Status: Patient does not smoke anymore.   Tobacco Use Assessment Completed: Used Tobacco in last 30 days? Drinks 2 caffeinated drinks per day.    REVIEW OF SYSTEMS:    GU Review Male:   Patient denies frequent urination, hard to postpone urination, burning/ pain with urination, get up at night to urinate, leakage of urine, stream starts and stops, trouble starting your stream, have to strain to urinate , erection problems, and penile pain.  Gastrointestinal (Upper):   Patient denies nausea, vomiting, and indigestion/ heartburn.  Gastrointestinal (Lower):   Patient denies diarrhea and constipation.  Constitutional:   Patient denies fever, night sweats, weight loss, and fatigue.  Skin:   Patient denies skin rash/ lesion and itching.  Eyes:   Patient denies blurred vision and double vision.  Ears/ Nose/ Throat:   Patient denies sore throat and sinus problems.  Hematologic/Lymphatic:   Patient denies  swollen glands and easy bruising.  Cardiovascular:   Patient denies leg swelling and chest pains.  Respiratory:   Patient denies cough and shortness of breath.  Endocrine:   Patient denies excessive thirst.  Musculoskeletal:   Patient denies back pain and joint pain.  Neurological:   Patient denies headaches and  dizziness.  Psychologic:   Patient denies depression and anxiety.   VITAL SIGNS: None   MULTI-SYSTEM PHYSICAL EXAMINATION:    Constitutional: Well-nourished. No physical deformities. Normally developed. Good grooming.  Neck: Neck symmetrical, not swollen. Normal tracheal position.  Respiratory: No labored breathing, no use of accessory muscles.   Skin: No paleness, no jaundice, no cyanosis. No lesion, no ulcer, no rash.  Neurologic / Psychiatric: Oriented to time, oriented to place, oriented to person. No depression, no anxiety, no agitation.  Eyes: Normal conjunctivae. Normal eyelids.  Ears, Nose, Mouth, and Throat: Left ear no scars, no lesions, no masses. Right ear no scars, no lesions, no masses. Nose no scars, no lesions, no masses. Normal hearing. Normal lips.  Musculoskeletal: Normal gait and station of head and neck.     Complexity of Data:  Records Review:   Previous Patient Records, POC Tool  Urine Test Review:   Urinalysis   PROCEDURES:         Flexible Cystoscopy - 52000  Risks, benefits, and some of the potential complications of the procedure were discussed at length with the patient including infection, bleeding, voiding discomfort, urinary retention, fever, chills, sepsis, and others. All questions were answered. Informed consent was obtained. Sterile technique and intraurethral analgesia were used.  Meatus:  Normal size. Normal location. Normal condition.  Urethra:  Moderate bulbous stricture unable to traverse cystoscope      The lower urinary tract was carefully examined until the stricture was encountered in the bulbar urethra. The procedure was well-tolerated and without complications. Antibiotic instructions were given. Instructions were given to call the office immediately for bloody urine, difficulty urinating, urinary retention, painful or frequent urination, fever, chills, nausea, vomiting or other illness. The patient stated that he understood these instructions  and would comply with them.         Urinalysis w/Scope Dipstick Dipstick Cont'd Micro  Color: Yellow Bilirubin: Neg mg/dL WBC/hpf: 0 - 5/hpf  Appearance: Clear Ketones: Neg mg/dL RBC/hpf: 3 - 60/AVW  Specific Gravity: 1.020 Blood: 2+ ery/uL Bacteria: Rare (0-9/hpf)  pH: 6.0 Protein: Trace mg/dL Cystals: NS (Not Seen)  Glucose: Neg mg/dL Urobilinogen: 0.2 mg/dL Casts: NS (Not Seen)    Nitrites: Neg Trichomonas: Not Present    Leukocyte Esterase: Neg leu/uL Mucous: Not Present      Epithelial Cells: NS (Not Seen)      Yeast: NS (Not Seen)      Sperm: Not Present    ASSESSMENT:      ICD-10 Details  1 GU:   Bladder Cancer Lateral - C67.2 Chronic, Stable  2   Bulbar urethral stricture - N35.011 Chronic, Stable  3   Gross hematuria - R31.0 Undiagnosed New Problem   PLAN:           Orders Labs CULTURE, URINE          Document Letter(s):  Created for Patient: Clinical Summary         Notes:   Bladder cancer:  -Patient is overdue for surveillance cystoscopy however him unable to traverse the scope through the urethral stricture. We discussed proceeding with diagnostic cystoscopy and possible TURBT in the operating room at same  time as urethral dilation. Risks and benefits of the procedure discussed with the patient and he has elected to proceed. These risks include but are not limited to pain, bleeding, infection, damage during fracture, need for additional treatment, bladder perforation, need for Foley catheter.   Bulbar urethral stricture:  -Patient with urethral stricture recurrence. We discussed repeating a urethral dilation in the operating room and he was agreeable. He will have a Foley catheter for at least 3 days following the procedure.

## 2023-01-26 ENCOUNTER — Other Ambulatory Visit: Payer: Self-pay

## 2023-01-26 ENCOUNTER — Encounter (HOSPITAL_COMMUNITY)
Admission: AD | Disposition: A | Discharge status: Home / Self Care | Payer: Self-pay | Source: Ambulatory Visit | Attending: Internal Medicine

## 2023-01-26 ENCOUNTER — Inpatient Hospital Stay (HOSPITAL_COMMUNITY)
Admission: AD | Admit: 2023-01-26 | Discharge: 2023-01-29 | DRG: 309 | Disposition: A | Discharge status: Home / Self Care | Payer: 59 | Source: Ambulatory Visit | Attending: Internal Medicine | Admitting: Internal Medicine

## 2023-01-26 ENCOUNTER — Encounter (HOSPITAL_COMMUNITY): Payer: Self-pay | Admitting: Urology

## 2023-01-26 ENCOUNTER — Inpatient Hospital Stay (HOSPITAL_COMMUNITY): Payer: 59

## 2023-01-26 DIAGNOSIS — I480 Paroxysmal atrial fibrillation: Secondary | ICD-10-CM | POA: Diagnosis not present

## 2023-01-26 DIAGNOSIS — N1832 Chronic kidney disease, stage 3b: Secondary | ICD-10-CM | POA: Diagnosis present

## 2023-01-26 DIAGNOSIS — E785 Hyperlipidemia, unspecified: Secondary | ICD-10-CM | POA: Diagnosis present

## 2023-01-26 DIAGNOSIS — Z7901 Long term (current) use of anticoagulants: Secondary | ICD-10-CM

## 2023-01-26 DIAGNOSIS — R079 Chest pain, unspecified: Secondary | ICD-10-CM | POA: Diagnosis not present

## 2023-01-26 DIAGNOSIS — I714 Abdominal aortic aneurysm, without rupture, unspecified: Secondary | ICD-10-CM | POA: Diagnosis present

## 2023-01-26 DIAGNOSIS — J4489 Other specified chronic obstructive pulmonary disease: Secondary | ICD-10-CM | POA: Diagnosis present

## 2023-01-26 DIAGNOSIS — Z794 Long term (current) use of insulin: Secondary | ICD-10-CM | POA: Diagnosis not present

## 2023-01-26 DIAGNOSIS — I4892 Unspecified atrial flutter: Secondary | ICD-10-CM

## 2023-01-26 DIAGNOSIS — Z79899 Other long term (current) drug therapy: Secondary | ICD-10-CM | POA: Diagnosis not present

## 2023-01-26 DIAGNOSIS — I129 Hypertensive chronic kidney disease with stage 1 through stage 4 chronic kidney disease, or unspecified chronic kidney disease: Secondary | ICD-10-CM | POA: Diagnosis present

## 2023-01-26 DIAGNOSIS — Z6832 Body mass index (BMI) 32.0-32.9, adult: Secondary | ICD-10-CM

## 2023-01-26 DIAGNOSIS — I2510 Atherosclerotic heart disease of native coronary artery without angina pectoris: Secondary | ICD-10-CM | POA: Diagnosis present

## 2023-01-26 DIAGNOSIS — E1122 Type 2 diabetes mellitus with diabetic chronic kidney disease: Secondary | ICD-10-CM | POA: Diagnosis present

## 2023-01-26 DIAGNOSIS — C679 Malignant neoplasm of bladder, unspecified: Secondary | ICD-10-CM | POA: Diagnosis present

## 2023-01-26 DIAGNOSIS — I255 Ischemic cardiomyopathy: Secondary | ICD-10-CM | POA: Diagnosis present

## 2023-01-26 DIAGNOSIS — Z7984 Long term (current) use of oral hypoglycemic drugs: Secondary | ICD-10-CM | POA: Diagnosis not present

## 2023-01-26 DIAGNOSIS — Z7902 Long term (current) use of antithrombotics/antiplatelets: Secondary | ICD-10-CM

## 2023-01-26 DIAGNOSIS — Z933 Colostomy status: Secondary | ICD-10-CM | POA: Diagnosis not present

## 2023-01-26 DIAGNOSIS — Z01818 Encounter for other preprocedural examination: Secondary | ICD-10-CM

## 2023-01-26 DIAGNOSIS — R109 Unspecified abdominal pain: Secondary | ICD-10-CM | POA: Diagnosis not present

## 2023-01-26 DIAGNOSIS — I4430 Unspecified atrioventricular block: Secondary | ICD-10-CM | POA: Diagnosis present

## 2023-01-26 DIAGNOSIS — R0782 Intercostal pain: Secondary | ICD-10-CM | POA: Diagnosis not present

## 2023-01-26 DIAGNOSIS — E1140 Type 2 diabetes mellitus with diabetic neuropathy, unspecified: Secondary | ICD-10-CM | POA: Diagnosis present

## 2023-01-26 DIAGNOSIS — M1990 Unspecified osteoarthritis, unspecified site: Secondary | ICD-10-CM | POA: Diagnosis present

## 2023-01-26 DIAGNOSIS — Z8546 Personal history of malignant neoplasm of prostate: Secondary | ICD-10-CM

## 2023-01-26 DIAGNOSIS — I4891 Unspecified atrial fibrillation: Secondary | ICD-10-CM | POA: Diagnosis present

## 2023-01-26 DIAGNOSIS — E669 Obesity, unspecified: Secondary | ICD-10-CM | POA: Diagnosis present

## 2023-01-26 DIAGNOSIS — Z951 Presence of aortocoronary bypass graft: Secondary | ICD-10-CM

## 2023-01-26 DIAGNOSIS — F419 Anxiety disorder, unspecified: Secondary | ICD-10-CM | POA: Diagnosis present

## 2023-01-26 DIAGNOSIS — E8809 Other disorders of plasma-protein metabolism, not elsewhere classified: Secondary | ICD-10-CM | POA: Diagnosis present

## 2023-01-26 DIAGNOSIS — F32A Depression, unspecified: Secondary | ICD-10-CM | POA: Diagnosis present

## 2023-01-26 DIAGNOSIS — I351 Nonrheumatic aortic (valve) insufficiency: Secondary | ICD-10-CM | POA: Diagnosis present

## 2023-01-26 DIAGNOSIS — R310 Gross hematuria: Secondary | ICD-10-CM | POA: Diagnosis present

## 2023-01-26 DIAGNOSIS — N179 Acute kidney failure, unspecified: Secondary | ICD-10-CM | POA: Diagnosis present

## 2023-01-26 DIAGNOSIS — N35912 Unspecified bulbous urethral stricture, male: Secondary | ICD-10-CM | POA: Diagnosis present

## 2023-01-26 DIAGNOSIS — R0789 Other chest pain: Secondary | ICD-10-CM | POA: Diagnosis present

## 2023-01-26 DIAGNOSIS — I4510 Unspecified right bundle-branch block: Secondary | ICD-10-CM | POA: Diagnosis present

## 2023-01-26 DIAGNOSIS — K219 Gastro-esophageal reflux disease without esophagitis: Secondary | ICD-10-CM | POA: Diagnosis present

## 2023-01-26 DIAGNOSIS — E119 Type 2 diabetes mellitus without complications: Secondary | ICD-10-CM

## 2023-01-26 DIAGNOSIS — Z87891 Personal history of nicotine dependence: Secondary | ICD-10-CM | POA: Diagnosis not present

## 2023-01-26 DIAGNOSIS — E118 Type 2 diabetes mellitus with unspecified complications: Secondary | ICD-10-CM

## 2023-01-26 DIAGNOSIS — R000 Tachycardia, unspecified: Secondary | ICD-10-CM | POA: Diagnosis present

## 2023-01-26 LAB — COMPREHENSIVE METABOLIC PANEL
ALT: 13 U/L (ref 0–44)
AST: 12 U/L — ABNORMAL LOW (ref 15–41)
Albumin: 3.3 g/dL — ABNORMAL LOW (ref 3.5–5.0)
Alkaline Phosphatase: 62 U/L (ref 38–126)
Anion gap: 9 (ref 5–15)
BUN: 33 mg/dL — ABNORMAL HIGH (ref 8–23)
CO2: 25 mmol/L (ref 22–32)
Calcium: 8.5 mg/dL — ABNORMAL LOW (ref 8.9–10.3)
Chloride: 105 mmol/L (ref 98–111)
Creatinine, Ser: 1.94 mg/dL — ABNORMAL HIGH (ref 0.61–1.24)
GFR, Estimated: 33 mL/min — ABNORMAL LOW (ref >60)
Glucose, Bld: 110 mg/dL — ABNORMAL HIGH (ref 70–99)
Potassium: 4.2 mmol/L (ref 3.5–5.1)
Sodium: 139 mmol/L (ref 135–145)
Total Bilirubin: 0.9 mg/dL (ref 0.3–1.2)
Total Protein: 6.4 g/dL — ABNORMAL LOW (ref 6.5–8.1)

## 2023-01-26 LAB — CBC
HCT: 49.6 % (ref 39.0–52.0)
Hemoglobin: 15.6 g/dL (ref 13.0–17.0)
MCH: 29 pg (ref 26.0–34.0)
MCHC: 31.5 g/dL (ref 30.0–36.0)
MCV: 92.2 fL (ref 80.0–100.0)
Platelets: 202 10*3/uL (ref 150–400)
RBC: 5.38 MIL/uL (ref 4.22–5.81)
RDW: 15.5 % (ref 11.5–15.5)
WBC: 8.9 10*3/uL (ref 4.0–10.5)
nRBC: 0 % (ref 0.0–0.2)

## 2023-01-26 LAB — PROTIME-INR
INR: 1.2 (ref 0.8–1.2)
Prothrombin Time: 15.3 s — ABNORMAL HIGH (ref 11.4–15.2)

## 2023-01-26 LAB — GLUCOSE, CAPILLARY
Glucose-Capillary: 168 mg/dL — ABNORMAL HIGH (ref 70–99)
Glucose-Capillary: 125 mg/dL — ABNORMAL HIGH (ref 70–99)
Glucose-Capillary: 117 mg/dL — ABNORMAL HIGH (ref 70–99)

## 2023-01-26 LAB — TSH: TSH: 0.858 u[IU]/mL (ref 0.350–4.500)

## 2023-01-26 LAB — APTT: aPTT: 30 s (ref 24–36)

## 2023-01-26 LAB — HEPARIN LEVEL (UNFRACTIONATED): Heparin Unfractionated: 0.36 [IU]/mL (ref 0.30–0.70)

## 2023-01-26 LAB — MAGNESIUM: Magnesium: 2.4 mg/dL (ref 1.7–2.4)

## 2023-01-26 SURGERY — CYSTOSCOPY WITH URETHRAL DILATATION
Anesthesia: General

## 2023-01-26 MED ORDER — HEPARIN BOLUS VIA INFUSION
4000.0000 [IU] | Freq: Once | INTRAVENOUS | Status: AC
  Administered 2023-01-26: 4000 [IU] via INTRAVENOUS
  Filled 2023-01-26: qty 4000

## 2023-01-26 MED ORDER — CHLORHEXIDINE GLUCONATE 0.12 % MT SOLN: 15.0000 mL | Freq: Once | OROMUCOSAL | Status: DC

## 2023-01-26 MED ORDER — INSULIN GLARGINE-YFGN 100 UNIT/ML ~~LOC~~ SOLN
20.0000 [IU] | Freq: Every day | SUBCUTANEOUS | Status: DC
  Administered 2023-01-27 – 2023-01-28 (×2): 20 [IU] via SUBCUTANEOUS
  Filled 2023-01-26 (×4): qty 0.2

## 2023-01-26 MED ORDER — ONDANSETRON HCL 4 MG/2ML IJ SOLN: 4.0000 mg | Freq: Four times a day (QID) | INTRAMUSCULAR | Status: DC | PRN

## 2023-01-26 MED ORDER — ORAL CARE MOUTH RINSE: 15.0000 mL | Freq: Once | OROMUCOSAL | Status: DC

## 2023-01-26 MED ORDER — INSULIN ASPART 100 UNIT/ML IJ SOLN
0.0000 [IU] | Freq: Three times a day (TID) | INTRAMUSCULAR | Status: DC
  Administered 2023-01-26 – 2023-01-29 (×6): 2 [IU] via SUBCUTANEOUS
  Administered 2023-01-29: 3 [IU] via SUBCUTANEOUS

## 2023-01-26 MED ORDER — LACTATED RINGERS IV SOLN: INTRAVENOUS | Status: DC

## 2023-01-26 MED ORDER — DEXAMETHASONE SODIUM PHOSPHATE 10 MG/ML IJ SOLN
INTRAMUSCULAR | Status: AC
  Filled 2023-01-26: qty 1

## 2023-01-26 MED ORDER — PROPOFOL 500 MG/50ML IV EMUL
INTRAVENOUS | Status: AC
  Filled 2023-01-26: qty 50

## 2023-01-26 MED ORDER — GABAPENTIN 100 MG PO CAPS
100.0000 mg | ORAL_CAPSULE | Freq: Three times a day (TID) | ORAL | Status: DC
  Administered 2023-01-26 – 2023-01-29 (×10): 100 mg via ORAL
  Filled 2023-01-26 (×10): qty 1

## 2023-01-26 MED ORDER — FENTANYL CITRATE (PF) 100 MCG/2ML IJ SOLN
INTRAMUSCULAR | Status: AC
  Filled 2023-01-26: qty 2

## 2023-01-26 MED ORDER — TRAMADOL HCL 50 MG PO TABS
50.0000 mg | ORAL_TABLET | Freq: Three times a day (TID) | ORAL | Status: DC | PRN
  Administered 2023-01-26 – 2023-01-28 (×4): 50 mg via ORAL
  Filled 2023-01-26 (×4): qty 1

## 2023-01-26 MED ORDER — ALBUTEROL SULFATE (2.5 MG/3ML) 0.083% IN NEBU: 2.5000 mg | INHALATION_SOLUTION | RESPIRATORY_TRACT | Status: DC | PRN

## 2023-01-26 MED ORDER — NITROGLYCERIN 0.4 MG SL SUBL
SUBLINGUAL_TABLET | SUBLINGUAL | Status: AC
  Filled 2023-01-26: qty 1

## 2023-01-26 MED ORDER — HEPARIN (PORCINE) 25000 UT/250ML-% IV SOLN
1150.0000 [IU]/h | INTRAVENOUS | Status: DC
  Administered 2023-01-26 – 2023-01-27 (×2): 1150 [IU]/h via INTRAVENOUS
  Filled 2023-01-26 (×2): qty 250

## 2023-01-26 MED ORDER — CEFAZOLIN SODIUM-DEXTROSE 2-4 GM/100ML-% IV SOLN
2.0000 g | INTRAVENOUS | Status: DC
  Filled 2023-01-26: qty 100

## 2023-01-26 MED ORDER — NITROGLYCERIN 0.4 MG SL SUBL
0.4000 mg | SUBLINGUAL_TABLET | SUBLINGUAL | Status: DC | PRN
  Administered 2023-01-26 (×2): 0.4 mg via SUBLINGUAL

## 2023-01-26 MED ORDER — FUROSEMIDE 40 MG PO TABS
40.0000 mg | ORAL_TABLET | Freq: Every day | ORAL | Status: DC
  Administered 2023-01-26: 40 mg via ORAL
  Filled 2023-01-26 (×2): qty 1

## 2023-01-26 MED ORDER — INSULIN ASPART 100 UNIT/ML IJ SOLN: 0.0000 [IU] | Freq: Every day | INTRAMUSCULAR | Status: DC

## 2023-01-26 MED ORDER — IRBESARTAN 150 MG PO TABS
75.0000 mg | ORAL_TABLET | Freq: Every day | ORAL | Status: DC
Start: 2023-01-26 — End: 2023-01-26

## 2023-01-26 MED ORDER — ACETAMINOPHEN 650 MG RE SUPP: 650.0000 mg | Freq: Four times a day (QID) | RECTAL | Status: DC | PRN

## 2023-01-26 MED ORDER — LIDOCAINE HCL (PF) 2 % IJ SOLN
INTRAMUSCULAR | Status: AC
  Filled 2023-01-26: qty 5

## 2023-01-26 MED ORDER — DULOXETINE HCL 60 MG PO CPEP
60.0000 mg | ORAL_CAPSULE | Freq: Every day | ORAL | Status: DC
  Administered 2023-01-26 – 2023-01-29 (×4): 60 mg via ORAL
  Filled 2023-01-26 (×4): qty 1

## 2023-01-26 MED ORDER — TRAZODONE HCL 50 MG PO TABS
25.0000 mg | ORAL_TABLET | Freq: Every evening | ORAL | Status: DC | PRN
  Administered 2023-01-26 – 2023-01-27 (×2): 25 mg via ORAL
  Filled 2023-01-26 (×2): qty 1

## 2023-01-26 MED ORDER — ROSUVASTATIN CALCIUM 20 MG PO TABS
40.0000 mg | ORAL_TABLET | Freq: Every day | ORAL | Status: DC
  Administered 2023-01-26 – 2023-01-29 (×4): 40 mg via ORAL
  Filled 2023-01-26 (×4): qty 2

## 2023-01-26 MED ORDER — ONDANSETRON HCL 4 MG PO TABS: 4.0000 mg | ORAL_TABLET | Freq: Four times a day (QID) | ORAL | Status: DC | PRN

## 2023-01-26 MED ORDER — ACETAMINOPHEN 500 MG PO TABS
1000.0000 mg | ORAL_TABLET | Freq: Once | ORAL | Status: DC
  Filled 2023-01-26: qty 2

## 2023-01-26 MED ORDER — METOPROLOL TARTRATE 5 MG/5ML IV SOLN
1.0000 mg | INTRAVENOUS | Status: DC
  Administered 2023-01-26: 2 mg via INTRAVENOUS
  Filled 2023-01-26: qty 5

## 2023-01-26 MED ORDER — ACETAMINOPHEN 325 MG PO TABS: 650.0000 mg | ORAL_TABLET | Freq: Four times a day (QID) | ORAL | Status: DC | PRN

## 2023-01-26 MED ORDER — METOPROLOL SUCCINATE ER 50 MG PO TB24
25.0000 mg | ORAL_TABLET | Freq: Every day | ORAL | Status: DC
  Administered 2023-01-26: 25 mg via ORAL
  Filled 2023-01-26 (×2): qty 1

## 2023-01-26 NOTE — Progress Notes (Signed)
ANTICOAGULATION CONSULT NOTE Pharmacy Consult for Heparin Indication: atrial fibrillation  No Known Allergies  Patient Measurements: Height: 5\' 6"  (167.6 cm) Weight: 90.7 kg (200 lb) IBW/kg (Calculated) : 63.8 Heparin Dosing Weight: 83 kg  Vital Signs: Temp: 97.7 F (36.5 C) (10/01 2050) Temp Source: Axillary (10/01 2050) BP: 130/82 (10/01 2050) Pulse Rate: 69 (10/01 2050)  Labs: Recent Labs    01/26/23 1253 01/26/23 2232  HGB 15.6  --   HCT 49.6  --   PLT 202  --   APTT 30  --   LABPROT 15.3*  --   INR 1.2  --   HEPARINUNFRC  --  0.36  CREATININE 1.94*  --     Estimated Creatinine Clearance: 28.8 mL/min (A) (by C-G formula based on SCr of 1.94 mg/dL (H)).   Medical History: Past Medical History:  Diagnosis Date   AAA (abdominal aortic aneurysm) (HCC)    Anemia    Anxiety    Arthritis    Asthma    Back pain    Cancer (HCC)    Chronic kidney disease    COPD (chronic obstructive pulmonary disease) (HCC)    Depression    Diabetes mellitus without complication (HCC)    Dyspnea    Hypertension    Insomnia     Medications:  Scheduled:   DULoxetine  60 mg Oral Daily   furosemide  40 mg Oral Daily   gabapentin  100 mg Oral TID   insulin aspart  0-15 Units Subcutaneous TID WC   insulin aspart  0-5 Units Subcutaneous QHS   insulin glargine-yfgn  20 Units Subcutaneous QHS   metoprolol succinate  25 mg Oral Daily   metoprolol tartrate  1-5 mg Intravenous UD   nitroGLYCERIN       rosuvastatin  40 mg Oral Daily   Infusions:   heparin 1,150 Units/hr (01/26/23 1407)    Assessment: 88 yoM presented to [facility] on 10/1 for planned diagnostic cystoscopy with possible TURBT and urethral dilatation.  The procedure was canceled due to tachycardia, EKG with Aflutter.  Pharmacy is consulted to dose Heparin IV.  PMH significant for bladder cancer and urethral stricture, hematuria, GI bleed related to colostomy (04/2022) or diverticulitis.   Baseline labs CBC and Coags  WNL.  01/26/2023:   Initial heparin level 0.36- therapeutic on IV heparin 1150 units/hr CBC: Hg/pltc WNL No bleeding or infusion related concerns reported by RN  Goal of Therapy:  Heparin level 0.3-0.7 units/ml Monitor platelets by anticoagulation protocol: Yes   Plan:  Continue heparin IV infusion at 1150 units/hr Confirmatory heparin level with morning labs Daily heparin level and CBC Monitor for s/s bleeding, hematuria  Junita Push, PharmD, BCPS 01/26/2023 11:12 PM

## 2023-01-26 NOTE — Progress Notes (Signed)
ANTICOAGULATION CONSULT NOTE - Initial Consult  Pharmacy Consult for Heparin Indication: atrial fibrillation  No Known Allergies  Patient Measurements: Height: 5\' 6"  (167.6 cm) Weight: 90.7 kg (200 lb) IBW/kg (Calculated) : 63.8 Heparin Dosing Weight: 83 kg  Vital Signs: Temp: 97.1 F (36.2 C) (10/01 0931) Temp Source: Axillary (10/01 0931) BP: 165/92 (10/01 1215) Pulse Rate: 89 (10/01 1215)  Labs: No results for input(s): "HGB", "HCT", "PLT", "APTT", "LABPROT", "INR", "HEPARINUNFRC", "HEPRLOWMOCWT", "CREATININE", "CKTOTAL", "CKMB", "TROPONINIHS" in the last 72 hours.  Estimated Creatinine Clearance: 35.9 mL/min (A) (by C-G formula based on SCr of 1.56 mg/dL (H)).   Medical History: Past Medical History:  Diagnosis Date   AAA (abdominal aortic aneurysm) (HCC)    Anemia    Anxiety    Arthritis    Asthma    Back pain    Cancer (HCC)    Chronic kidney disease    COPD (chronic obstructive pulmonary disease) (HCC)    Depression    Diabetes mellitus without complication (HCC)    Dyspnea    Hypertension    Insomnia     Medications:  Scheduled:   acetaminophen  1,000 mg Oral Once   chlorhexidine  15 mL Mouth/Throat Once   Or   mouth rinse  15 mL Mouth Rinse Once   metoprolol tartrate  1-5 mg Intravenous UD   Infusions:    ceFAZolin (ANCEF) IV     lactated ringers 10 mL/hr at 01/26/23 1005    Assessment: 88 yoM presented to [facility] on 10/1 for planned diagnostic cystoscopy with possible TURBT and urethral dilatation.  The procedure was canceled due to tachycardia, EKG with Aflutter.  Pharmacy is consulted to dose Heparin IV.  PMH significant for bladder cancer and urethral stricture, hematuria, GI bleed related to colostomy (04/2022) or diverticulitis.    Baseline labs CBC and Coags in process  Goal of Therapy:  Heparin level 0.3-0.7 units/ml Monitor platelets by anticoagulation protocol: Yes   Plan:  Give heparin 4000 units bolus IV x 1 Start heparin IV infusion  at 1150 units/hr Heparin level 8 hours after starting Daily heparin level and CBC Monitor for s/s bleeding, hematuria   Lynann Beaver PharmD, BCPS [facility] pharmacy [phone] 01/26/2023 12:31 PM

## 2023-01-26 NOTE — Progress Notes (Signed)
Patient case cancelled today. He has HR 130s with A fib/A flutter.  Anesthesia has placed stat consult for evaluation.

## 2023-01-26 NOTE — H&P (Signed)
History and Physical  Eddie Graham OZH:[id number] DOB: Aug 06, 1934 DOA: 01/26/2023  PCP: Andreas Blower., MD   Chief Complaint: Tachycardia   HPI: Eddie Graham is a 88 y.o. male with medical history significant for CAD, COPD on room air, hypertension, depression, type 2 diabetes insulin-dependent being admitted to the hospital with new onset atrial fibrillation RVR.  Patient has a history of prostate cancer and multiple cystoscopies with urethral dilation he was scheduled for repeat cystoscopy today due to continued microscopic hematuria.  He came today to same-day surgery, prior to the procedure was noted to be tachycardic, found to be in atrial fibrillation/flutter.  He was given a dose of IV metoprolol heart rate improved to about 132 90s.  He remains in atrial fibrillation.  He has no known prior history of this.  He has had some sort of cardiac surgery around the year 2005, but unable to tell me what the surgery was.  He is accompanied today by his godson, who is partially translating for the patient as he is not completely fluent in [place].  Patient denies any recent dizziness, chest pain, palpitations, godson states that when they go to the store over the last few months he has noticed that the patient gets a little bit more winded with ambulation.  He denies any orthopnea, or lower extremity edema.   Review of Systems: Please see HPI for pertinent positives and negatives. A complete 10 system review of systems are otherwise negative.  Past Medical History:  Diagnosis Date   AAA (abdominal aortic aneurysm) (HCC)    Anemia    Anxiety    Arthritis    Asthma    Back pain    Cancer (HCC)    Chronic kidney disease    COPD (chronic obstructive pulmonary disease) (HCC)    Depression    Diabetes mellitus without complication (HCC)    Dyspnea    Hypertension    Insomnia    Past Surgical History:  Procedure Laterality Date   BOWEL RESECTION     CARDIAC SURGERY      CYSTOSCOPY WITH URETHRAL DILATATION N/A 09/30/2021   Procedure: CYSTOSCOPY WITH  URETHRAL BALLOON  DILATATION;  Surgeon: Noel Christmas, MD;  Location: [facility];  Service: Urology;  Laterality: N/A;   CYSTOSCOPY WITH URETHRAL DILATATION N/A 12/09/2021   Procedure: CYSTOSCOPY WITH URETHRAL DILATATION;  Surgeon: Noel Christmas, MD;  Location: [facility];  Service: Urology;  Laterality: N/A;  1 HR   PROSTATE SURGERY     TRANSURETHRAL RESECTION OF BLADDER TUMOR N/A 09/30/2021   Procedure: TRANSURETHRAL RESECTION OF BLADDER TUMOR (TURBT);  Surgeon: Noel Christmas, MD;  Location: [facility];  Service: Urology;  Laterality: N/A;  41 MINS    Social History:  reports that he has never smoked. He has never used smokeless tobacco. He reports that he does not currently use alcohol. He reports that he does not currently use drugs.   No Known Allergies  History reviewed. No pertinent family history.   Prior to Admission medications   Medication Sig Start Date End Date Taking? Authorizing Provider  acetaminophen (TYLENOL) 650 MG CR tablet Take 650 mg by mouth every 8 (eight) hours as needed for pain. 03/26/22  Yes [provider]  albuterol (VENTOLIN HFA) 108 (90 Base) MCG/ACT inhaler Inhale 2 puffs into the lungs every 4 (four) hours as needed for wheezing. 11/11/20  Yes [provider]  Ernestina Patches 62.5-25 MCG/ACT AEPB Inhale 1 puff into the lungs daily.  Yes [provider]  Carboxymethylcellulose Sodium (EYE DROPS OP) Place 2 drops into both eyes as needed (dryness/itching).   Yes [provider]  diclofenac Sodium (VOLTAREN) 1 % GEL Apply 2 g topically 4 (four) times daily. 12/20/21  Yes Vann, Jessica U, DO  DULoxetine (CYMBALTA) 60 MG capsule Take 30-60 mg by mouth daily. 02/07/21  Yes [provider]  ferrous sulfate 325 (65 FE) MG tablet Take 325 mg by mouth daily with breakfast. 08/21/22  Yes [provider]  furosemide (LASIX) 40 MG tablet  Take 40 mg by mouth daily.   Yes [provider]  gabapentin (NEURONTIN) 100 MG capsule Take 100 mg by mouth 3 (three) times daily. 02/25/21  Yes [provider]  glipiZIDE (GLUCOTROL) 10 MG tablet Take 10 mg by mouth 2 (two) times daily before a meal. 03/25/21  Yes [provider]  LANTUS SOLOSTAR 100 UNIT/ML Solostar Pen Inject 15 Units into the skin at bedtime. Patient taking differently: Inject 24 Units into the skin at bedtime. 06/15/22  Yes Rhetta Mura, MD  lidocaine (LIDODERM) 5 % Place 1 patch onto the skin daily. Remove & Discard patch within 12 hours or as directed by MD 11/01/21  Yes Blue, Soijett A, PA-C  Melatonin 5 MG CAPS Take 5 mg by mouth at bedtime as needed (sleep). 08/16/19  Yes [provider]  metoprolol succinate (TOPROL-XL) 25 MG 24 hr tablet Take 25 mg by mouth daily. 04/07/21  Yes [provider]  montelukast (SINGULAIR) 10 MG tablet Take 10 mg by mouth daily. 03/19/21  Yes [provider]  pioglitazone (ACTOS) 30 MG tablet Take 30 mg by mouth daily. 12/22/21  Yes [provider]  polyethylene glycol (MIRALAX / GLYCOLAX) 17 g packet Take 17 g by mouth daily as needed for mild constipation. 03/24/22  Yes [provider]  rosuvastatin (CRESTOR) 40 MG tablet Take 40 mg by mouth daily. 04/07/21  Yes [provider]  Semaglutide 14 MG TABS Take 14 mg by mouth daily. 03/14/21  Yes [provider]  tamsulosin (FLOMAX) 0.4 MG CAPS capsule Take 0.4 mg by mouth daily. 11/23/21  Yes [provider]  traMADol (ULTRAM) 50 MG tablet Take 50 mg by mouth every 8 (eight) hours as needed for moderate pain. 10/14/22  Yes [provider]  traZODone (DESYREL) 50 MG tablet Take 50 mg by mouth daily. 03/14/21  Yes [provider]  valsartan (DIOVAN) 80 MG tablet Take 80 mg by mouth at bedtime. 02/05/22  Yes [provider]  clopidogrel (PLAVIX) 75 MG tablet Take 1 tablet  (75 mg total) by mouth daily. Patient not taking: Reported on 01/20/2023 06/19/22   Rhetta Mura, MD  Insulin Pen Needle (TECHLITE PEN NEEDLES) 32G X 4 MM MISC Use to inject Lantus at bedtime as directed 06/15/22   Rhetta Mura, MD  pantoprazole (PROTONIX) 40 MG tablet Take 1 tablet by mouth 2 times daily. Patient not taking: Reported on 01/20/2023 06/15/22   Rhetta Mura, MD  senna (SENOKOT) 8.6 MG TABS tablet Take 1 tablet (8.6 mg total) by mouth daily. Patient not taking: Reported on 01/20/2023 03/13/22   Quincy Simmonds, MD    Physical Exam: BP 108/64   Pulse (!) 124   Temp (!) 97.1 F (36.2 C) (Axillary)   Resp 19   Ht 5\' 6"  (1.676 m)   Wt 90.7 kg   SpO2 98%   BMI 32.28 kg/m   General:  Alert, oriented, calm, in no acute distress,  currently on 2 L nasal cannula oxygen saturating 96% Eyes: EOMI, clear conjuctivae, white sclerea Neck: supple, no masses, trachea mildline  Cardiovascular: Irregular, no murmurs or rubs, no peripheral edema, he has a well-healed midline sternotomy incision Respiratory: clear to auscultation bilaterally, no wheezes, no crackles  Abdomen: soft, nontender, nondistended, normal bowel tones heard  Skin: dry, no rashes  Musculoskeletal: no joint effusions, normal range of motion  Psychiatric: appropriate affect, normal speech  Neurologic: extraocular muscles intact, clear speech, moving all extremities with intact sensorium         Labs on Admission:  Basic Metabolic Panel: Recent Labs  Lab 01/20/23 1522  NA 138  K 3.9  CL 102  CO2 26  GLUCOSE 90  BUN 29*  CREATININE 1.56*  CALCIUM 8.8*   Liver Function Tests: No results for input(s): "AST", "ALT", "ALKPHOS", "BILITOT", "PROT", "ALBUMIN" in the last 168 hours. No results for input(s): "LIPASE", "AMYLASE" in the last 168 hours. No results for input(s): "AMMONIA" in the last 168 hours. CBC: Recent Labs  Lab 01/20/23 1522  WBC 8.4  HGB 13.8  HCT 43.7  MCV 92.0  PLT  152   Cardiac Enzymes: No results for input(s): "CKTOTAL", "CKMB", "CKMBINDEX", "TROPONINI" in the last 168 hours.  BNP (last 3 results) No results for input(s): "BNP" in the last 8760 hours.  ProBNP (last 3 results) No results for input(s): "PROBNP" in the last 8760 hours.  CBG: Recent Labs  Lab 01/26/23 1010  GLUCAP 168*    Radiological Exams on Admission: No results found.  Assessment/Plan Eddie Graham is a 88 y.o. male with medical history significant for CAD, COPD on room air, hypertension, depression, type 2 diabetes insulin-dependent being admitted to the hospital with new onset atrial fibrillation RVR.   A-fib RVR-this is asymptomatic, patient is hemodynamically stable.  RVR has resolved after administration of IV metoprolol 2 mg. -Inpatient admission to telemetry -Check electrolytes including magnesium -Check TSH -Obtain 2D echo -Appreciate cardiology consultation -Consider increase home Toprol-XL  Insulin-dependent type 2 diabetes-last hemoglobin A1c 7.30 December 2022 -Carb controlled diet -Lantus 15 units at bedtime -Moderate sliding scale  Hypertension-continue home ARB, Toprol-XL, Lasix  CKD 3-presumably due to his diabetes, appears to be at baseline  GERD-Protonix twice daily  Peripheral neuropathy-Neurontin 100 p.o. 3 times daily  Hyperlipidemia-Crestor  DVT prophylaxis: Lovenox     Code Status: Full Code  Consults called: Cardiology  Admission status: The appropriate patient status for this patient is INPATIENT. Inpatient status is judged to be reasonable and necessary in order to provide the required intensity of service to ensure the patient's safety. The patient's presenting symptoms, physical exam findings, and initial radiographic and laboratory data in the context of their chronic comorbidities is felt to place them at high risk for further clinical deterioration. Furthermore, it is not anticipated that the patient will be  medically stable for discharge from the hospital within 2 midnights of admission.    I certify that at the point of admission it is my clinical judgment that the patient will require inpatient hospital care spanning beyond 2 midnights from the point of admission due to high intensity of service, high risk for further deterioration and high frequency of surveillance required  Time spent: 59 minutes  Bradley Bostelman Sharlette Dense MD [facility] Pager [phone]  If 7PM-7AM, please contact night-coverage [email or web address] Password [email or web address]  01/26/2023, 11:09 AM

## 2023-01-26 NOTE — Plan of Care (Signed)
  Problem: Nutritional: Goal: Maintenance of adequate nutrition will improve Outcome: Progressing Goal: Progress toward achieving an optimal weight will improve Outcome: Progressing   

## 2023-01-26 NOTE — Interval H&P Note (Signed)
History and Physical Interval Note: Patient has held plavix.   01/26/2023 10:00 AM  Eddie Graham  has presented today for surgery, with the diagnosis of URETHRAL STRICTURE, GROSS HEMATURIA.  The various methods of treatment have been discussed with the patient and family. After consideration of risks, benefits and other options for treatment, the patient has consented to  Procedure(s): CYSTOSCOPY WITH URETHRAL DILATATION (N/A) POSSIBLE TRANSURETHRAL RESECTION OF BLADDER TUMOR (TURBT) (N/A) as a surgical intervention.  The patient's history has been reviewed, patient examined, no change in status, stable for surgery.  I have reviewed the patient's chart and labs.  Questions were answered to the patient's satisfaction.     [name] D Codey Burling

## 2023-01-26 NOTE — Anesthesia Preprocedure Evaluation (Addendum)
Anesthesia Evaluation  Patient identified by MRN, date of birth, ID band Patient awake    Reviewed: Allergy & Precautions, H&P , NPO status , Patient's Chart, lab work & pertinent test results, reviewed documented beta blocker date and time   Airway        Dental no notable dental hx.    Pulmonary asthma , COPD,  COPD inhaler   Pulmonary exam normal        Cardiovascular hypertension, Pt. on medications and Pt. on home beta blockers      Neuro/Psych   Anxiety Depression    negative neurological ROS     GI/Hepatic negative GI ROS, Neg liver ROS,,,  Endo/Other  diabetes, Insulin Dependent, Oral Hypoglycemic Agents    Renal/GU Renal disease  negative genitourinary   Musculoskeletal  (+) Arthritis , Osteoarthritis,    Abdominal   Peds  Hematology  (+) Blood dyscrasia, anemia   Anesthesia Other Findings   Reproductive/Obstetrics negative OB ROS                             Anesthesia Physical Anesthesia Plan  ASA: 3  Anesthesia Plan: General   Post-op Pain Management: Tylenol PO (pre-op)*   Induction: Intravenous  PONV Risk Score and Plan: 3 and Ondansetron, Dexamethasone and Treatment may vary due to age or medical condition  Airway Management Planned: Oral ETT  Additional Equipment:   Intra-op Plan:   Post-operative Plan: Extubation in OR  Informed Consent: I have reviewed the patients History and Physical, chart, labs and discussed the procedure including the risks, benefits and alternatives for the proposed anesthesia with the patient or authorized representative who has indicated his/her understanding and acceptance.     Dental advisory given  Plan Discussed with: CRNA  Anesthesia Plan Comments: (Pt in A-flutter with HR of 130. Has a remote h/o "cardiac surgery" but I have no record of what was done. A-flutter is new and will need to be evaluated prior to surgery. Pt will  be admitted and cardiology consulted.)       Anesthesia Quick Evaluation

## 2023-01-26 NOTE — Consult Note (Addendum)
Cardiology Consultation   Patient ID: Eddie Graham MRN: [id number]; DOB: 1934/07/01  Admit date: 01/26/2023 Date of Consult: 01/26/2023  PCP:  Andreas Blower., MD   [facility] Providers Cardiologist:  None      Patient Profile:   Edwards Mindel is a 88 y.o. male with a hx of CABG in 2011, chronic diverticulitis, urethral structure, history of high-grade bladder cancer, anemia, AAA, COPD, CKD, HTN who is being seen 01/26/2023 for the evaluation of atrial flutter at the request of Dr. Arita Miss.  History of Present Illness:   Mr. Harders is an 88 year old male with above medical history. Per chart review, patient previously underwent CABG in 2011. Has not been followed by cardiology since he moved to [place] from [place] in 2017. Patient has been followed by [facility] for history of bladder cancer. Patient had been diagnosed with bladder cancer in 2019, and underwent TURBT at that time. He presented to [facility] in 09/2021 after he developed gross hematuria. He underrwent urethral balloon dilation and TRUBT in 09/2021. Later underwent cystoscopy with urethral dilatation in 11/2021. He has a history of diverticulitis and underwent resection of a perforated intramural sigmoid colonic abscess in 04/2022 with end colostomy. Admitted to [facility] in 05/2022 with an acute GI bleed. His hemoglobin remained stable during his admission, and bleeding resolved. GI consulted did not feel patient required colonoscopy, suspected that bleeding was either post op bleeding vs diverticulitis.   He was seen by urology in 12/2022 to schedule surveillance cystoscopy. He had microscopic hematuria at the time of his appointment. Urology was unable to traverse the scope through a urethral stricture. He was scheduled for diagnostic cystoscopy with possible TURBT and urethral dilatation on 10/1. Preoperative labs showed K 3.9, creatinine 1.56, WBC 8.4, hemoglobin 13.8, platelets  152.   He presented for his procedure on 10/1. In preop, he was found to be tachycardic with HR into the 130s. EKG showed atrial flutter with variable AV block, HR 98 BPM, RBBB present. He was given one dose of IV metoprolol 2 mg. Cardiology was urgently consulted for atrial flutter.   On interview, patient reports feeling short of breath. He has been feeling short of breath on exertion for quite some time now. He does have inhalers at home that help his breathing. Denies cough, palpitations, chest pain, dizziness, lightheadedness, near syncope. Denies orthopnea, ankle edema, cough. Denies fatigue.  He is not aware of his elevated HR currently. Patient reports having heart surgery several years ago, but he cannot recall why he had heart surgery. Per chart review, appears that he had CABG in 2011.     Past Medical History:  Diagnosis Date   AAA (abdominal aortic aneurysm) (HCC)    Anemia    Anxiety    Arthritis    Asthma    Back pain    Cancer (HCC)    Chronic kidney disease    COPD (chronic obstructive pulmonary disease) (HCC)    Depression    Diabetes mellitus without complication (HCC)    Dyspnea    Hypertension    Insomnia     Past Surgical History:  Procedure Laterality Date   BOWEL RESECTION     CARDIAC SURGERY     CYSTOSCOPY WITH URETHRAL DILATATION N/A 09/30/2021   Procedure: CYSTOSCOPY WITH  URETHRAL BALLOON  DILATATION;  Surgeon: Noel Christmas, MD;  Location: [facility];  Service: Urology;  Laterality: N/A;   CYSTOSCOPY WITH URETHRAL DILATATION N/A 12/09/2021  Procedure: CYSTOSCOPY WITH URETHRAL DILATATION;  Surgeon: Noel Christmas, MD;  Location: [facility];  Service: Urology;  Laterality: N/A;  1 HR   PROSTATE SURGERY     TRANSURETHRAL RESECTION OF BLADDER TUMOR N/A 09/30/2021   Procedure: TRANSURETHRAL RESECTION OF BLADDER TUMOR (TURBT);  Surgeon: Noel Christmas, MD;  Location: [facility];  Service: Urology;  Laterality: N/A;  90 MINS     Home Medications:  Prior to  Admission medications   Medication Sig Start Date End Date Taking? Authorizing Provider  acetaminophen (TYLENOL) 650 MG CR tablet Take 650 mg by mouth every 8 (eight) hours as needed for pain. 03/26/22  Yes [provider]  albuterol (VENTOLIN HFA) 108 (90 Base) MCG/ACT inhaler Inhale 2 puffs into the lungs every 4 (four) hours as needed for wheezing. 11/11/20  Yes [provider]  Ernestina Patches 62.5-25 MCG/ACT AEPB Inhale 1 puff into the lungs daily.   Yes [provider]  Carboxymethylcellulose Sodium (EYE DROPS OP) Place 2 drops into both eyes as needed (dryness/itching).   Yes [provider]  diclofenac Sodium (VOLTAREN) 1 % GEL Apply 2 g topically 4 (four) times daily. 12/20/21  Yes Vann, Jessica U, DO  DULoxetine (CYMBALTA) 60 MG capsule Take 30-60 mg by mouth daily. 02/07/21  Yes [provider]  ferrous sulfate 325 (65 FE) MG tablet Take 325 mg by mouth daily with breakfast. 08/21/22  Yes [provider]  furosemide (LASIX) 40 MG tablet Take 40 mg by mouth daily.   Yes [provider]  gabapentin (NEURONTIN) 100 MG capsule Take 100 mg by mouth 3 (three) times daily. 02/25/21  Yes [provider]  glipiZIDE (GLUCOTROL) 10 MG tablet Take 10 mg by mouth 2 (two) times daily before a meal. 03/25/21  Yes [provider]  LANTUS SOLOSTAR 100 UNIT/ML Solostar Pen Inject 15 Units into the skin at bedtime. Patient taking differently: Inject 24 Units into the skin at bedtime. 06/15/22  Yes Rhetta Mura, MD  lidocaine (LIDODERM) 5 % Place 1 patch onto the skin daily. Remove & Discard patch within 12 hours or as directed by MD 11/01/21  Yes Blue, Soijett A, PA-C  Melatonin 5 MG CAPS Take 5 mg by mouth at bedtime as needed (sleep). 08/16/19  Yes [provider]  metoprolol succinate (TOPROL-XL) 25 MG 24 hr tablet Take 25 mg by mouth daily. 04/07/21  Yes [provider]  montelukast (SINGULAIR) 10 MG tablet  Take 10 mg by mouth daily. 03/19/21  Yes [provider]  pioglitazone (ACTOS) 30 MG tablet Take 30 mg by mouth daily. 12/22/21  Yes [provider]  polyethylene glycol (MIRALAX / GLYCOLAX) 17 g packet Take 17 g by mouth daily as needed for mild constipation. 03/24/22  Yes [provider]  rosuvastatin (CRESTOR) 40 MG tablet Take 40 mg by mouth daily. 04/07/21  Yes [provider]  Semaglutide 14 MG TABS Take 14 mg by mouth daily. 03/14/21  Yes [provider]  tamsulosin (FLOMAX) 0.4 MG CAPS capsule Take 0.4 mg by mouth daily. 11/23/21  Yes [provider]  traMADol (ULTRAM) 50 MG tablet Take 50 mg by mouth every 8 (eight) hours as needed for moderate pain. 10/14/22  Yes [provider]  traZODone (DESYREL) 50 MG tablet Take 50 mg by mouth daily. 03/14/21  Yes [provider]  valsartan (DIOVAN) 80 MG tablet Take 80 mg by mouth at bedtime. 02/05/22  Yes [provider]  clopidogrel (PLAVIX) 75 MG  tablet Take 1 tablet (75 mg total) by mouth daily. Patient not taking: Reported on 01/20/2023 06/19/22   Rhetta Mura, MD  Insulin Pen Needle (TECHLITE PEN NEEDLES) 32G X 4 MM MISC Use to inject Lantus at bedtime as directed 06/15/22   Rhetta Mura, MD  pantoprazole (PROTONIX) 40 MG tablet Take 1 tablet by mouth 2 times daily. Patient not taking: Reported on 01/20/2023 06/15/22   Rhetta Mura, MD  senna (SENOKOT) 8.6 MG TABS tablet Take 1 tablet (8.6 mg total) by mouth daily. Patient not taking: Reported on 01/20/2023 03/13/22   Quincy Simmonds, MD    Inpatient Medications: Scheduled Meds:  acetaminophen  1,000 mg Oral Once   chlorhexidine  15 mL Mouth/Throat Once   Or   mouth rinse  15 mL Mouth Rinse Once   metoprolol tartrate  1-5 mg Intravenous UD   Continuous Infusions:   ceFAZolin (ANCEF) IV     lactated ringers 10 mL/hr at 01/26/23 1005   PRN Meds:   Allergies:   No Known  Allergies  Social History:   Social History   Socioeconomic History   Marital status: Married    Spouse name: Not on file   Number of children: Not on file   Years of education: Not on file   Highest education level: Not on file  Occupational History   Not on file  Tobacco Use   Smoking status: Never   Smokeless tobacco: Never  Vaping Use   Vaping status: Never Used  Substance and Sexual Activity   Alcohol use: Not Currently   Drug use: Not Currently   Sexual activity: Not on file  Other Topics Concern   Not on file  Social History Narrative   Not on file   Social Determinants of Health   Financial Resource Strain: Low Risk  (08/29/2021)   Overall Financial Resource Strain (CARDIA)    Difficulty of Paying Living Expenses: Not very hard  Food Insecurity: No Food Insecurity (06/13/2022)   Hunger Vital Sign    Worried About Running Out of Food in the Last Year: Never true    Ran Out of Food in the Last Year: Never true  Transportation Needs: No Transportation Needs (06/13/2022)   PRAPARE - Administrator, Civil Service (Medical): No    Lack of Transportation (Non-Medical): No  Physical Activity: Not on file  Stress: Not on file  Social Connections: Not on file  Intimate Partner Violence: Not At Risk (06/13/2022)   Humiliation, Afraid, Rape, and Kick questionnaire    Fear of Current or Ex-Partner: No    Emotionally Abused: No    Physically Abused: No    Sexually Abused: No    Family History:   History reviewed. No pertinent family history.   ROS:  Please see the history of present illness.   All other ROS reviewed and negative.     Physical Exam/Data:   Vitals:   01/26/23 1045 01/26/23 1100 01/26/23 1115 01/26/23 1130  BP: 108/64 132/68 116/62 129/87  Pulse:  (!) 103 93 98  Resp:  19 19 18   Temp:      TempSrc:      SpO2: 98% 97% 97% 96%  Weight:      Height:       No intake or output data in the 24 hours ending 01/26/23 1139    01/26/2023     9:29 AM 01/20/2023    2:46 PM 06/12/2022    2:54 PM  Last 3 Weights  Weight (lbs) 200 lb 200 lb 187 lb 6.3 oz  Weight (kg) 90.719 kg 90.719 kg 85 kg     Body mass index is 32.28 kg/m.  General:  Well nourished, well developed, in no acute distress. Sitting upright in the bed  HEENT: normal Neck: no JVD Vascular: Radial pulses 2+ bilaterally Cardiac:  normal S1, S2; irregular rate and rhythm. No murmurs  Lungs:  clear to auscultation bilaterally, no wheezing, rhonchi or rales. Normal work of breathing on room air  Abd: soft, nontender Ext: no edema in BLE  Musculoskeletal:  No deformities, BUE and BLE strength normal and equal Skin: warm and dry  Neuro:  CNs 2-12 intact, no focal abnormalities noted Psych:  Normal affect   EKG:  The EKG was personally reviewed and demonstrates:  Atrial flutter with variable AV block, HR 98 BPM, RBBB present  Telemetry:  Telemetry was personally reviewed and demonstrates:  Atrial flutter with variable AV conduction. HR in the 90s   Relevant CV Studies:   Laboratory Data:  High Sensitivity Troponin:  No results for input(s): "TROPONINIHS" in the last 720 hours.   Chemistry Recent Labs  Lab 01/20/23 1522  NA 138  K 3.9  CL 102  CO2 26  GLUCOSE 90  BUN 29*  CREATININE 1.56*  CALCIUM 8.8*  GFRNONAA 43*  ANIONGAP 10    No results for input(s): "PROT", "ALBUMIN", "AST", "ALT", "ALKPHOS", "BILITOT" in the last 168 hours. Lipids No results for input(s): "CHOL", "TRIG", "HDL", "LABVLDL", "LDLCALC", "CHOLHDL" in the last 168 hours.  Hematology Recent Labs  Lab 01/20/23 1522  WBC 8.4  RBC 4.75  HGB 13.8  HCT 43.7  MCV 92.0  MCH 29.1  MCHC 31.6  RDW 15.8*  PLT 152   Thyroid No results for input(s): "TSH", "FREET4" in the last 168 hours.  BNPNo results for input(s): "BNP", "PROBNP" in the last 168 hours.  DDimer No results for input(s): "DDIMER" in the last 168 hours.   Radiology/Studies:  No results found.   Assessment and Plan:    New Atrial Flutter - Patient presented for scheduled urologic procedure and was found to be tachycardic. EKG showed atrial flutter with variable AV block, HR 98 BPM, RBBB present  - Patient was given one dose of IV metoprolol 2mg  - patient remains in atrial flutter, but HR now controlled in the 90s  - PTA, patient was on metoprolol succinate 25 mg daily. Increase to 50 mg daily  - CHADS-VASc 5 (HTN, diabetes, vascular disease, agex2). Patient should be on anticoagulation.  - Patient is currently pending urologic surgery, canceled for today. Start IV heparin for now in case urology wants to proceed with procedure this admission. Additionally, patient has history of hematuria, had microscopic hematuria when seen by urology on 9/6. Monitor hemoglobin, hematuria on heparin. Plan to transition to DOAC prior to DC when done with all procedures  - Hemoglobin 13.8 on labs 9/25. Prior to this, hemoglobin averaged around 10.7-11  - Echocardiogram pending - Ordered TSH, CBC, BMP, mag   Shortness of breath  - Patient reported having some shortness of breath on exertion recently. Is feeling short of breath today when in aflutter  - Suspect this is a symptom of atrial flutter with RVR. - Patient euvolemic on exam  - Echo pending as above  - Patient does have COPD- resume home inhalers   CAD s/p CABG  - Per chart review, appears patient underwent CABG in 2011. He does report having heart surgery in  the past, but is unsure why he had surgery and cannot explain specifics  - Echocardiogram pending  - Patient without chest pain  - Stop plavix with addition of heparin/DOAC - Continue crestor 40 mg daily  - Ordered lipid panel to assess risk factors   HTN - Resume home regiment on admission   Otherwise per primary  - Urethral stricture, hematuria  - Diabetes - Peripheral neuropathy   Risk Assessment/Risk Scores:      CHA2DS2-VASc Score = 5   This indicates a 7.2% annual risk of stroke. The  patient's score is based upon: CHF History: 0 HTN History: 1 Diabetes History: 1 Stroke History: 0 Vascular Disease History: 1 Age Score: 2 Gender Score: 0     For questions or updates, please contact [facility] Please consult [email or web address] for contact info under    Signed, Jonita Albee, PA-C  01/26/2023 11:39 AM  History and all data above reviewed.  Patient examined.  I agree with the findings as above.   The patient has a history of CABG he says in 2005 in [place].  He doesn't report any problems since then.  Lives alone and has a friend who drives him places.  He says he has been doing fine and has no limitations.  He does not feel his heart beating.  The patient denies any new symptoms such as chest discomfort, neck or arm discomfort. There has been no new shortness of breath, PND or orthopnea. There have been no reported palpitations, presyncope or syncope.  He does report some blood in his colostomy bag at times but this is vague and I cannot confirm this.  (Interpreter used for this interview but the patient seemed to have some trouble with understanding some of the questions. )  He has a colostomy status post treatment of diverticulitis.  I do see some results from [facility] and he had NSR on EKG in July of this year.  He does not think that his heart has been out of rhythm in the past.  He has not heard of atrial fib or flutter.  He does not take his HR or BP at home.  The patient exam reveals [id number]   ,  Lungs: Clear  ,  Abd: Positive bowel sounds, no rebound no guarding, Ext No edema   .  All available labs, radiology testing, previous records reviewed. Agree with documented assessment and plan.   Atrial fib flutter:  Plan heparin and if able at discharge DOAC.  For now rate control.  We have no idea how long he has been in flutter.  I will start with metoprolol PO for rate control.   Echo is ordered.  I don't suspect any acute coronary issues and think he would be at  acceptable risk for surgery bending control of is ventricular rate.  I would not be planning cardioversion this admission (which would require TEE) unless he has a rate we are not able to control medically.    Fayrene Fearing Jayleon Mcfarlane  2:04 PM  01/26/2023

## 2023-01-27 ENCOUNTER — Inpatient Hospital Stay (HOSPITAL_COMMUNITY): Payer: 59

## 2023-01-27 DIAGNOSIS — I4892 Unspecified atrial flutter: Secondary | ICD-10-CM | POA: Diagnosis not present

## 2023-01-27 DIAGNOSIS — R079 Chest pain, unspecified: Secondary | ICD-10-CM

## 2023-01-27 DIAGNOSIS — C679 Malignant neoplasm of bladder, unspecified: Secondary | ICD-10-CM

## 2023-01-27 DIAGNOSIS — I4891 Unspecified atrial fibrillation: Secondary | ICD-10-CM | POA: Diagnosis not present

## 2023-01-27 DIAGNOSIS — I480 Paroxysmal atrial fibrillation: Secondary | ICD-10-CM | POA: Diagnosis not present

## 2023-01-27 LAB — CBC
HCT: 46.6 % (ref 39.0–52.0)
Hemoglobin: 14.7 g/dL (ref 13.0–17.0)
MCH: 29.2 pg (ref 26.0–34.0)
MCHC: 31.5 g/dL (ref 30.0–36.0)
MCV: 92.6 fL (ref 80.0–100.0)
Platelets: 204 10*3/uL (ref 150–400)
RBC: 5.03 MIL/uL (ref 4.22–5.81)
RDW: 15.4 % (ref 11.5–15.5)
WBC: 8.6 10*3/uL (ref 4.0–10.5)
nRBC: 0 % (ref 0.0–0.2)

## 2023-01-27 LAB — BASIC METABOLIC PANEL
Anion gap: 10 (ref 5–15)
BUN: 37 mg/dL — ABNORMAL HIGH (ref 8–23)
CO2: 25 mmol/L (ref 22–32)
Calcium: 8.2 mg/dL — ABNORMAL LOW (ref 8.9–10.3)
Chloride: 102 mmol/L (ref 98–111)
Creatinine, Ser: 1.91 mg/dL — ABNORMAL HIGH (ref 0.61–1.24)
GFR, Estimated: 34 mL/min — ABNORMAL LOW (ref >60)
Glucose, Bld: 117 mg/dL — ABNORMAL HIGH (ref 70–99)
Potassium: 3.8 mmol/L (ref 3.5–5.1)
Sodium: 137 mmol/L (ref 135–145)

## 2023-01-27 LAB — ECHOCARDIOGRAM COMPLETE
Area-P 1/2: 4.68 cm2
Calc EF: 48.8 %
Height: 66 in
S' Lateral: 4 cm
Single Plane A2C EF: 50 %
Single Plane A4C EF: 47.9 %
Weight: 3199.99 [oz_av]

## 2023-01-27 LAB — HEPARIN LEVEL (UNFRACTIONATED): Heparin Unfractionated: 0.34 [IU]/mL (ref 0.30–0.70)

## 2023-01-27 LAB — GLUCOSE, CAPILLARY
Glucose-Capillary: 107 mg/dL — ABNORMAL HIGH (ref 70–99)
Glucose-Capillary: 136 mg/dL — ABNORMAL HIGH (ref 70–99)
Glucose-Capillary: 131 mg/dL — ABNORMAL HIGH (ref 70–99)
Glucose-Capillary: 150 mg/dL — ABNORMAL HIGH (ref 70–99)

## 2023-01-27 LAB — TROPONIN I (HIGH SENSITIVITY)
Troponin I (High Sensitivity): 18 ng/L — ABNORMAL HIGH (ref <18)
Troponin I (High Sensitivity): 18 ng/L — ABNORMAL HIGH (ref <18)

## 2023-01-27 LAB — C DIFFICILE QUICK SCREEN W PCR REFLEX
C Diff antigen: NEGATIVE
C Diff interpretation: NOT DETECTED
C Diff toxin: NEGATIVE

## 2023-01-27 MED ORDER — METOPROLOL TARTRATE 25 MG PO TABS
25.0000 mg | ORAL_TABLET | Freq: Four times a day (QID) | ORAL | Status: DC
  Administered 2023-01-27 – 2023-01-28 (×3): 25 mg via ORAL
  Filled 2023-01-27 (×3): qty 1

## 2023-01-27 MED ORDER — APIXABAN 2.5 MG PO TABS
2.5000 mg | ORAL_TABLET | Freq: Two times a day (BID) | ORAL | Status: DC
  Administered 2023-01-27 – 2023-01-29 (×5): 2.5 mg via ORAL
  Filled 2023-01-27 (×5): qty 1

## 2023-01-27 MED ORDER — APIXABAN 5 MG PO TABS: 5.0000 mg | ORAL_TABLET | Freq: Two times a day (BID) | ORAL | Status: DC

## 2023-01-27 MED ORDER — METOPROLOL SUCCINATE ER 50 MG PO TB24
50.0000 mg | ORAL_TABLET | Freq: Every day | ORAL | Status: DC
  Administered 2023-01-27: 50 mg via ORAL

## 2023-01-27 NOTE — TOC Initial Note (Addendum)
Transition of Care [facility]) - Initial/Assessment Note    Patient Details  Name: Eddie Graham MRN: [id number] Date of Birth: 12-Aug-1934  [facility]) CM/SW Contact:    Lanier Clam, RN Phone Number: 01/27/2023, 11:06 AM  Clinical Narrative:Patient speaks English.Spoke to [name](dtr) & Francisco(Godson) about d/c plans-dtr concerned about colostomy bags ordering locally in [place]-provided WOC Dawn with [employer] tel# to provide resources for ordering supplies.Has dme & motorized scooter for small errands. Has own transport home.                   Expected Discharge Plan: Home/Self Care Barriers to Discharge: Continued Medical Work up   Patient Goals and CMS Choice Patient states their goals for this hospitalization and ongoing recovery are:: Home CMS Medicare.gov Compare Post Acute Care list provided to:: Patient Represenative (must comment) Choice offered to / list presented to : Adult Children [place] ownership interest in [facility].provided to:: Adult Children    Expected Discharge Plan and Services   Discharge Planning Services: CM Consult   Living arrangements for the past 2 months: Single Family Home                                      Prior Living Arrangements/Services Living arrangements for the past 2 months: Single Family Home Lives with:: Self Patient language and need for interpreter reviewed:: Yes Do you feel safe going back to the place where you live?: Yes      Need for Family Participation in Patient Care: Yes (Comment) Care giver support system in place?: Yes (comment) Current home services: DME (cane, rw,motorized scooter(transportation)) Criminal Activity/Legal Involvement Pertinent to Current Situation/Hospitalization: No - Comment as needed  Activities of Daily Living   ADL Screening (condition at time of admission) Independently performs ADLs?: No Does the patient have a NEW difficulty with  bathing/dressing/toileting/self-feeding that is expected to last >3 days?: No Does the patient have a NEW difficulty with getting in/out of bed, walking, or climbing stairs that is expected to last >3 days?: No Does the patient have a NEW difficulty with communication that is expected to last >3 days?: No Is the patient deaf or have difficulty hearing?: No Does the patient have difficulty seeing, even when wearing glasses/contacts?: No Does the patient have difficulty concentrating, remembering, or making decisions?: No  Permission Sought/Granted Permission sought to share information with : Case Manager Permission granted to share information with : Yes, Verbal Permission Granted  Share Information with NAME: Case Manager           Emotional Assessment Appearance:: Appears stated age Attitude/Demeanor/Rapport: Gracious Affect (typically observed): Accepting Orientation: : Oriented to Self, Oriented to Place, Oriented to  Time, Oriented to Situation Alcohol / Substance Use: Not Applicable Psych Involvement: No (comment)  Admission diagnosis:  New onset a-fib [facility]) [I48.91] Patient Active Problem List   Diagnosis Date Noted   New onset a-fib (HCC) 01/26/2023   Lower GI bleeding 06/12/2022   CAD (coronary artery disease) 06/12/2022   CKD (chronic kidney disease) stage 3, GFR 30-59 ml/min (HCC) 06/12/2022   Acute GI bleeding 06/12/2022   Lesion of urinary bladder 06/12/2022   AAA (abdominal aortic aneurysm) (HCC) 06/12/2022   Diverticulitis 06/12/2022   Diverticulitis of large intestine with abscess without bleeding 03/10/2022   Type 2 diabetes mellitus with complication, without long-term current use of insulin (HCC) 12/20/2021   Sigmoid diverticulitis 12/17/2021  Urethral stricture 12/09/2021   Bladder mass 09/30/2021   AAA (abdominal aortic aneurysm) without rupture (HCC) 08/05/2021   PCP:  Andreas Blower., MD Pharmacy:   [facility] - [place], [place] - [address] [facility] [address] [place] [address] Phone: [phone] Fax: [phone]  [id number] [place], [place] - [phone] AT [facility] & [address] [place] [address] Phone: [phone] Fax: [phone]  [facility] [address] [place] [address] Phone: [phone] Fax: [phone]  [facility] [address] [place] [address] Phone: [phone] Fax: [phone]     Social Determinants of Health (SDOH) Social History: SDOH Screenings   Food Insecurity: No Food Insecurity (01/26/2023)  Housing: Low Risk  (01/26/2023)  Transportation Needs: No Transportation Needs (01/26/2023)  Utilities: Not At Risk (01/26/2023)  Financial Resource Strain: Low Risk  (08/29/2021)  Tobacco Use: Low Risk  (01/26/2023)  Recent Concern: Tobacco Use - Medium Risk (11/12/2022)   Received from [facility]   SDOH Interventions:     Readmission Risk Interventions     No data to display

## 2023-01-27 NOTE — Progress Notes (Signed)
PROGRESS NOTE    Eddie Graham  ZHY:[id number] DOB: March 27, 1935 DOA: 01/26/2023 PCP: Andreas Blower., MD   Brief Narrative:  HPI:  Eddie Graham is a 88 y.o. male with medical history significant for CAD, COPD on room air, hypertension, depression, type 2 diabetes insulin-dependent being admitted to the hospital with new onset atrial fibrillation RVR.  Patient has a history of prostate cancer and multiple cystoscopies with urethral dilation he was scheduled for repeat cystoscopy today due to continued microscopic hematuria.  He came today to same-day surgery, prior to the procedure was noted to be tachycardic, found to be in atrial fibrillation/flutter.  He was given a dose of IV metoprolol heart rate improved to about 132 90s.  He remains in atrial fibrillation.  He has no known prior history of this.  He has had some sort of cardiac surgery around the year 2005, but unable to tell me what the surgery was.  He is accompanied today by his godson, who is partially translating for the patient as he is not completely fluent in [place].  Patient denies any recent dizziness, chest pain, palpitations, godson states that when they go to the store over the last few months he has noticed that the patient gets a little bit more winded with ambulation.  He denies any orthopnea, or lower extremity edema.   **Interim History Cardiology to start DOAC now that surgery has been rescheduled. Will need PT/OT to Evaluate.   Assessment and Plan:  Eddie Graham is a 88 y.o. male with medical history significant for CAD, COPD on room air, hypertension, depression, type 2 diabetes insulin-dependent being admitted to the hospital with new onset atrial fibrillation RVR.    A-fib RVR/ A Flutter This is asymptomatic, patient is hemodynamically stable.  RVR has resolved after administration of IV metoprolol 2 mg. -Inpatient admission to telemetry -Check electrolytes including  magnesium -Checked TSH and was 0.858 -Obtain 2D echo -Appreciate cardiology consultation -Consider increase home Toprol-XL and he is now on metoprolol succinate 25 mg p.o. daily and this has been increased to 50 mg daily -Heart rates in the 100s and he does occasionally spike to 130s -Cardiology discussed with urology and the patient is undergoing rescheduling of his case and they feel that the patient is most likely to be in atrial fibrillation given that they are not in a cardiovert him and even that TEE/DCCV would delay surgery at least a month.  They feel that is most prudent to start Eliquis and plan his procedure and they are recommending stopping Eliquis if he has gross hematuria or bleeding.  They are recommending holding Eliquis for 2 days prior to the procedure and hopefully restarting soon afterwards and over next 3 weeks and we will make a decision about his rate control strategy over the control strategy with cardioversion 3 weeks after Eliquis is uninterrupted.   Insulin-Dependent Type 2 Diabetes  -Last Hemoglobin A1c 7.30 December 2022 -Carb controlled diet -Hold Glipizide 10 mg po BID for now along with Pioglitazone 30 mg po Daily and Semiglutide 14 mg po Daily  -C/w Long acting Insulin with Semglee and increased dose from 15 units sq at bedtime to 20 qHS -C/w Moderate sliding scale   Hypertension -Continue home ARB, Toprol-XL, Lasix -See above and cardiology in following   CKD Stage 3b -Presumably due to his Diabetes, appears to be at baseline -BUN/Cr Trend: Recent Labs  Lab 01/20/23 1522 01/26/23 1253 01/27/23 0437  BUN 29* 33* 37*  CREATININE 1.56* 1.94* 1.91*  -  labs and imaging studies  CBC: Recent Labs  Lab 01/26/23 1253 01/27/23 0437  WBC 8.9 8.6  HGB 15.6 14.7  HCT 49.6 46.6  MCV 92.2 92.6  PLT 202 204   Basic Metabolic Panel: Recent Labs  Lab 01/26/23 1253 01/27/23 0437  NA 139 137  K 4.2 3.8  CL 105 102  CO2 25 25  GLUCOSE 110* 117*  BUN 33* 37*  CREATININE 1.94* 1.91*  CALCIUM 8.5* 8.2*  MG 2.4  --    GFR: Estimated Creatinine Clearance: 29.3 mL/min (A) (by C-G formula based on SCr of 1.91 mg/dL (H)). Liver Function Tests: Recent Labs  Lab 01/26/23 1253  AST 12*  ALT 13  ALKPHOS 62  BILITOT 0.9  PROT 6.4*  ALBUMIN 3.3*   No results for input(s): "LIPASE", "AMYLASE" in the last 168 hours. No results for input(s): "AMMONIA" in the last 168  hours. Coagulation Profile: Recent Labs  Lab 01/26/23 1253  INR 1.2   Cardiac Enzymes: No results for input(s): "CKTOTAL", "CKMB", "CKMBINDEX", "TROPONINI" in the last 168 hours. BNP (last 3 results) No results for input(s): "PROBNP" in the last 8760 hours. HbA1C: No results for input(s): "HGBA1C" in the last 72 hours. CBG: Recent Labs  Lab 01/26/23 1743 01/26/23 2102 01/27/23 0734 01/27/23 1149 01/27/23 1623  GLUCAP 125* 117* 107* 136* 131*   Lipid Profile: No results for input(s): "CHOL", "HDL", "LDLCALC", "TRIG", "CHOLHDL", "LDLDIRECT" in the last 72 hours. Thyroid Function Tests: Recent Labs    01/26/23 1253  TSH 0.858   Anemia Panel: No results for input(s): "VITAMINB12", "FOLATE", "FERRITIN", "TIBC", "IRON", "RETICCTPCT" in the last 72 hours. Sepsis Labs: No results for input(s): "PROCALCITON", "LATICACIDVEN" in the last 168 hours.  No results found for this or any previous visit (from the past 240 hour(s)).   Radiology Studies: ECHOCARDIOGRAM COMPLETE  Result Date: 01/27/2023    ECHOCARDIOGRAM REPORT   Patient Name:   Eddie Graham Date of Exam: 01/27/2023 Medical Rec #:  [id number]                 Height:       66.0 in Accession #:    [id number]                Weight:       200.0 lb Date of Birth:  Jun 08, 1934                 BSA:          2.000 m Patient Age:    88 years                  BP:           120/68 mmHg Patient Gender: M                         HR:           117 bpm. Exam Location:  Inpatient Procedure: 2D Echo, Cardiac Doppler and Color Doppler Indications:    I48.91* Unspeicified atrial fibrillation; R07.9* Chest pain,                 unspecified  History:        Patient has no prior history of Echocardiogram examinations.                 CAD, Abnormal ECG; Risk Factors:Dyslipidemia.  Sonographer:    Sheralyn Boatman RDCS Referring Phys: [id number] MIR Judie Petit [facility]  Sonographer  PROGRESS NOTE    Eddie Graham  ZHY:[id number] DOB: March 27, 1935 DOA: 01/26/2023 PCP: Andreas Blower., MD   Brief Narrative:  HPI:  Eddie Graham is a 88 y.o. male with medical history significant for CAD, COPD on room air, hypertension, depression, type 2 diabetes insulin-dependent being admitted to the hospital with new onset atrial fibrillation RVR.  Patient has a history of prostate cancer and multiple cystoscopies with urethral dilation he was scheduled for repeat cystoscopy today due to continued microscopic hematuria.  He came today to same-day surgery, prior to the procedure was noted to be tachycardic, found to be in atrial fibrillation/flutter.  He was given a dose of IV metoprolol heart rate improved to about 132 90s.  He remains in atrial fibrillation.  He has no known prior history of this.  He has had some sort of cardiac surgery around the year 2005, but unable to tell me what the surgery was.  He is accompanied today by his godson, who is partially translating for the patient as he is not completely fluent in [place].  Patient denies any recent dizziness, chest pain, palpitations, godson states that when they go to the store over the last few months he has noticed that the patient gets a little bit more winded with ambulation.  He denies any orthopnea, or lower extremity edema.   **Interim History Cardiology to start DOAC now that surgery has been rescheduled. Will need PT/OT to Evaluate.   Assessment and Plan:  Eddie Graham is a 88 y.o. male with medical history significant for CAD, COPD on room air, hypertension, depression, type 2 diabetes insulin-dependent being admitted to the hospital with new onset atrial fibrillation RVR.    A-fib RVR/ A Flutter This is asymptomatic, patient is hemodynamically stable.  RVR has resolved after administration of IV metoprolol 2 mg. -Inpatient admission to telemetry -Check electrolytes including  magnesium -Checked TSH and was 0.858 -Obtain 2D echo -Appreciate cardiology consultation -Consider increase home Toprol-XL and he is now on metoprolol succinate 25 mg p.o. daily and this has been increased to 50 mg daily -Heart rates in the 100s and he does occasionally spike to 130s -Cardiology discussed with urology and the patient is undergoing rescheduling of his case and they feel that the patient is most likely to be in atrial fibrillation given that they are not in a cardiovert him and even that TEE/DCCV would delay surgery at least a month.  They feel that is most prudent to start Eliquis and plan his procedure and they are recommending stopping Eliquis if he has gross hematuria or bleeding.  They are recommending holding Eliquis for 2 days prior to the procedure and hopefully restarting soon afterwards and over next 3 weeks and we will make a decision about his rate control strategy over the control strategy with cardioversion 3 weeks after Eliquis is uninterrupted.   Insulin-Dependent Type 2 Diabetes  -Last Hemoglobin A1c 7.30 December 2022 -Carb controlled diet -Hold Glipizide 10 mg po BID for now along with Pioglitazone 30 mg po Daily and Semiglutide 14 mg po Daily  -C/w Long acting Insulin with Semglee and increased dose from 15 units sq at bedtime to 20 qHS -C/w Moderate sliding scale   Hypertension -Continue home ARB, Toprol-XL, Lasix -See above and cardiology in following   CKD Stage 3b -Presumably due to his Diabetes, appears to be at baseline -BUN/Cr Trend: Recent Labs  Lab 01/20/23 1522 01/26/23 1253 01/27/23 0437  BUN 29* 33* 37*  CREATININE 1.56* 1.94* 1.91*  -  PROGRESS NOTE    Eddie Graham  ZHY:[id number] DOB: March 27, 1935 DOA: 01/26/2023 PCP: Andreas Blower., MD   Brief Narrative:  HPI:  Eddie Graham is a 88 y.o. male with medical history significant for CAD, COPD on room air, hypertension, depression, type 2 diabetes insulin-dependent being admitted to the hospital with new onset atrial fibrillation RVR.  Patient has a history of prostate cancer and multiple cystoscopies with urethral dilation he was scheduled for repeat cystoscopy today due to continued microscopic hematuria.  He came today to same-day surgery, prior to the procedure was noted to be tachycardic, found to be in atrial fibrillation/flutter.  He was given a dose of IV metoprolol heart rate improved to about 132 90s.  He remains in atrial fibrillation.  He has no known prior history of this.  He has had some sort of cardiac surgery around the year 2005, but unable to tell me what the surgery was.  He is accompanied today by his godson, who is partially translating for the patient as he is not completely fluent in [place].  Patient denies any recent dizziness, chest pain, palpitations, godson states that when they go to the store over the last few months he has noticed that the patient gets a little bit more winded with ambulation.  He denies any orthopnea, or lower extremity edema.   **Interim History Cardiology to start DOAC now that surgery has been rescheduled. Will need PT/OT to Evaluate.   Assessment and Plan:  Eddie Graham is a 88 y.o. male with medical history significant for CAD, COPD on room air, hypertension, depression, type 2 diabetes insulin-dependent being admitted to the hospital with new onset atrial fibrillation RVR.    A-fib RVR/ A Flutter This is asymptomatic, patient is hemodynamically stable.  RVR has resolved after administration of IV metoprolol 2 mg. -Inpatient admission to telemetry -Check electrolytes including  magnesium -Checked TSH and was 0.858 -Obtain 2D echo -Appreciate cardiology consultation -Consider increase home Toprol-XL and he is now on metoprolol succinate 25 mg p.o. daily and this has been increased to 50 mg daily -Heart rates in the 100s and he does occasionally spike to 130s -Cardiology discussed with urology and the patient is undergoing rescheduling of his case and they feel that the patient is most likely to be in atrial fibrillation given that they are not in a cardiovert him and even that TEE/DCCV would delay surgery at least a month.  They feel that is most prudent to start Eliquis and plan his procedure and they are recommending stopping Eliquis if he has gross hematuria or bleeding.  They are recommending holding Eliquis for 2 days prior to the procedure and hopefully restarting soon afterwards and over next 3 weeks and we will make a decision about his rate control strategy over the control strategy with cardioversion 3 weeks after Eliquis is uninterrupted.   Insulin-Dependent Type 2 Diabetes  -Last Hemoglobin A1c 7.30 December 2022 -Carb controlled diet -Hold Glipizide 10 mg po BID for now along with Pioglitazone 30 mg po Daily and Semiglutide 14 mg po Daily  -C/w Long acting Insulin with Semglee and increased dose from 15 units sq at bedtime to 20 qHS -C/w Moderate sliding scale   Hypertension -Continue home ARB, Toprol-XL, Lasix -See above and cardiology in following   CKD Stage 3b -Presumably due to his Diabetes, appears to be at baseline -BUN/Cr Trend: Recent Labs  Lab 01/20/23 1522 01/26/23 1253 01/27/23 0437  BUN 29* 33* 37*  CREATININE 1.56* 1.94* 1.91*  -  labs and imaging studies  CBC: Recent Labs  Lab 01/26/23 1253 01/27/23 0437  WBC 8.9 8.6  HGB 15.6 14.7  HCT 49.6 46.6  MCV 92.2 92.6  PLT 202 204   Basic Metabolic Panel: Recent Labs  Lab 01/26/23 1253 01/27/23 0437  NA 139 137  K 4.2 3.8  CL 105 102  CO2 25 25  GLUCOSE 110* 117*  BUN 33* 37*  CREATININE 1.94* 1.91*  CALCIUM 8.5* 8.2*  MG 2.4  --    GFR: Estimated Creatinine Clearance: 29.3 mL/min (A) (by C-G formula based on SCr of 1.91 mg/dL (H)). Liver Function Tests: Recent Labs  Lab 01/26/23 1253  AST 12*  ALT 13  ALKPHOS 62  BILITOT 0.9  PROT 6.4*  ALBUMIN 3.3*   No results for input(s): "LIPASE", "AMYLASE" in the last 168 hours. No results for input(s): "AMMONIA" in the last 168  hours. Coagulation Profile: Recent Labs  Lab 01/26/23 1253  INR 1.2   Cardiac Enzymes: No results for input(s): "CKTOTAL", "CKMB", "CKMBINDEX", "TROPONINI" in the last 168 hours. BNP (last 3 results) No results for input(s): "PROBNP" in the last 8760 hours. HbA1C: No results for input(s): "HGBA1C" in the last 72 hours. CBG: Recent Labs  Lab 01/26/23 1743 01/26/23 2102 01/27/23 0734 01/27/23 1149 01/27/23 1623  GLUCAP 125* 117* 107* 136* 131*   Lipid Profile: No results for input(s): "CHOL", "HDL", "LDLCALC", "TRIG", "CHOLHDL", "LDLDIRECT" in the last 72 hours. Thyroid Function Tests: Recent Labs    01/26/23 1253  TSH 0.858   Anemia Panel: No results for input(s): "VITAMINB12", "FOLATE", "FERRITIN", "TIBC", "IRON", "RETICCTPCT" in the last 72 hours. Sepsis Labs: No results for input(s): "PROCALCITON", "LATICACIDVEN" in the last 168 hours.  No results found for this or any previous visit (from the past 240 hour(s)).   Radiology Studies: ECHOCARDIOGRAM COMPLETE  Result Date: 01/27/2023    ECHOCARDIOGRAM REPORT   Patient Name:   Eddie Graham Date of Exam: 01/27/2023 Medical Rec #:  [id number]                 Height:       66.0 in Accession #:    [id number]                Weight:       200.0 lb Date of Birth:  Jun 08, 1934                 BSA:          2.000 m Patient Age:    88 years                  BP:           120/68 mmHg Patient Gender: M                         HR:           117 bpm. Exam Location:  Inpatient Procedure: 2D Echo, Cardiac Doppler and Color Doppler Indications:    I48.91* Unspeicified atrial fibrillation; R07.9* Chest pain,                 unspecified  History:        Patient has no prior history of Echocardiogram examinations.                 CAD, Abnormal ECG; Risk Factors:Dyslipidemia.  Sonographer:    Sheralyn Boatman RDCS Referring Phys: [id number] MIR Judie Petit [facility]  Sonographer

## 2023-01-27 NOTE — Plan of Care (Signed)
  Problem: Clinical Measurements: Goal: Cardiovascular complication will be avoided 01/27/2023 1512 by Peggye Ley, RN Outcome: Progressing 01/27/2023 1511 by Peggye Ley, RN Outcome: Progressing 01/27/2023 1244 by Peggye Ley, RN Outcome: Progressing   Problem: Clinical Measurements: Goal: Respiratory complications will improve 01/27/2023 1511 by Peggye Ley, RN Outcome: Progressing 01/27/2023 1244 by Peggye Ley, RN Outcome: Progressing   Problem: Clinical Measurements: Goal: Ability to maintain clinical measurements within normal limits will improve 01/27/2023 1511 by Peggye Ley, RN Outcome: Progressing 01/27/2023 1244 by Peggye Ley, RN Outcome: Progressing   Problem: Cardiac: Goal: Ability to achieve and maintain adequate cardiopulmonary perfusion will improve 01/27/2023 1512 by Peggye Ley, RN Outcome: Progressing 01/27/2023 1511 by Peggye Ley, RN Outcome: Progressing

## 2023-01-27 NOTE — Progress Notes (Signed)
  Echocardiogram 2D Echocardiogram has been performed.  Janalyn Harder 01/27/2023, 8:55 AM

## 2023-01-27 NOTE — Progress Notes (Signed)
ANTICOAGULATION CONSULT NOTE Pharmacy Consult for Heparin Indication: atrial fibrillation  No Known Allergies  Patient Measurements: Height: 5\' 6"  (167.6 cm) Weight: 90.7 kg (200 lb) IBW/kg (Calculated) : 63.8 Heparin Dosing Weight: 83 kg  Vital Signs: Temp: 97.6 F (36.4 C) (10/02 0500) Temp Source: Oral (10/02 0500) BP: 120/68 (10/02 0500) Pulse Rate: 78 (10/02 0500)  Labs: Recent Labs    01/26/23 1253 01/26/23 2232 01/27/23 0437  HGB 15.6  --  14.7  HCT 49.6  --  46.6  PLT 202  --  204  APTT 30  --   --   LABPROT 15.3*  --   --   INR 1.2  --   --   HEPARINUNFRC  --  0.36 0.34  CREATININE 1.94*  --  1.91*    Estimated Creatinine Clearance: 29.3 mL/min (A) (by C-G formula based on SCr of 1.91 mg/dL (H)).   Medical History: Past Medical History:  Diagnosis Date   AAA (abdominal aortic aneurysm) (HCC)    Anemia    Anxiety    Arthritis    Asthma    Back pain    Cancer (HCC)    Chronic kidney disease    COPD (chronic obstructive pulmonary disease) (HCC)    Depression    Diabetes mellitus without complication (HCC)    Dyspnea    Hypertension    Insomnia     Medications:  Scheduled:   DULoxetine  60 mg Oral Daily   furosemide  40 mg Oral Daily   gabapentin  100 mg Oral TID   insulin aspart  0-15 Units Subcutaneous TID WC   insulin aspart  0-5 Units Subcutaneous QHS   insulin glargine-yfgn  20 Units Subcutaneous QHS   metoprolol succinate  25 mg Oral Daily   metoprolol tartrate  1-5 mg Intravenous UD   rosuvastatin  40 mg Oral Daily   Infusions:   heparin 1,150 Units/hr (01/26/23 1407)    Assessment: 88 yoM presented to [facility] on 10/1 for planned diagnostic cystoscopy with possible TURBT and urethral dilatation.  The procedure was canceled due to tachycardia, EKG with Aflutter.  Pharmacy is consulted to dose Heparin IV.  PMH significant for bladder cancer and urethral stricture, hematuria, GI bleed related to colostomy (04/2022) or diverticulitis.    Baseline labs CBC and Coags WNL.  01/27/2023:   Heparin level 0.34- remains therapeutic on IV heparin 1150 units/hr CBC: Hg/pltc WNL No bleeding or infusion related concerns reported by RN  Goal of Therapy:  Heparin level 0.3-0.7 units/ml Monitor platelets by anticoagulation protocol: Yes   Plan:  Continue heparin IV infusion at 1150 units/hr Daily heparin level and CBC Monitor for s/s bleeding, hematuria  Junita Push, PharmD, BCPS 01/27/2023 5:55 AM

## 2023-01-27 NOTE — Progress Notes (Addendum)
Patient Name: Eddie Graham Date of Encounter: 01/27/2023 [facility] Cardiologist: Rollene Rotunda, MD   Interval Summary  .    Patient reports some chest tightness this AM. Tightness worsens with deep inhalation. Reproducible on palpation. Reports that he has been having this chest tightness at home prior to admission. Also feels short of breath this am. Echo completed, read pending   Vital Signs .    Vitals:   01/26/23 2058 01/27/23 0110 01/27/23 0500 01/27/23 0800  BP:  121/77 120/68 (!) 119/56  Pulse:  84 78 (!) 55  Resp:    20  Temp:  (!) 97.4 F (36.3 C) 97.6 F (36.4 C) (!) 97.5 F (36.4 C)  TempSrc:  Axillary Oral Oral  SpO2: 98% 96% 98%   Weight:      Height:        Intake/Output Summary (Last 24 hours) at 01/27/2023 0930 Last data filed at 01/27/2023 0900 Gross per 24 hour  Intake 808.33 ml  Output --  Net 808.33 ml      01/26/2023    9:29 AM 01/20/2023    2:46 PM 06/12/2022    2:54 PM  Last 3 Weights  Weight (lbs) 200 lb 200 lb 187 lb 6.3 oz  Weight (kg) 90.719 kg 90.719 kg 85 kg      Telemetry/ECG    Atrial flutter with variable conduction, HR in the 100s.  - Personally Reviewed  Physical Exam .   GEN: No acute distress.  Laying flat in the bed  Neck: No JVD Cardiac: Irregular rate and rhythm. Tachycardic, no murmurs, rubs, or gallops.  Respiratory: Clear to auscultation bilaterally.Normal work of breathing on room air  GI: Soft, colostomy bag in place  MS: No edema in BLE   Assessment & Plan .     New Atrial Flutter - Patient presented for scheduled urologic procedure and was found to be tachycardic. EKG showed atrial flutter with variable AV block, HR 98 BPM, RBBB present  - TSH normal  - Now on metoprolol succinate 25 mg daily  - Per telemetry, HR predominantly in the 100s. Does occasionally spike to the 130s. - Increase metoprolol to 50 mg daily  - CHADS-VASc 5 (HTN, diabetes, vascular disease, agex2). Patient should  be on anticoagulation. Patient is pending urologic procedure. Continue IV heparin for now and anticipate transition to DOAC prior to DC  - Echocardiogram pending   Chest Tightness  - Patient reports chest tightness. Worse on deep inhalation. He has been having this tightness for a while at home - Pain is reproducible to palpation on exam. Possible musculoskeletal, but need to rule out ischemia with his history of CABG  - hsTn ordered and pending  - Echocardiogram pending   Shortness of breath  - Patient reported having some shortness of breath on exertion recently. Is feeling short of breath today when in aflutter  - Suspect this is a symptom of atrial flutter with RVR. - Patient euvolemic on exam. CXR yesterday showed small left effusion or focal airspace disease. Creatinine 1.91. No diuretic for now  - Echo pending as above    CAD s/p CABG  - Per chart review, appears patient underwent CABG in 2011. He does report having heart surgery in the past, but is unsure why he had surgery and cannot explain specifics  - Echocardiogram pending  - Stop plavix with addition of heparin/DOAC - Continue crestor 40 mg daily  - Ordered lipid panel to assess risk factors  HTN - Metoprolol succinate 50 mg daily as above    Otherwise per primary  - Urethral stricture, hematuria  - Diabetes - Peripheral neuropathy   For questions or updates, please contact  [facility] Please consult [email or web address] for contact info under        Signed, Jonita Albee, PA-C   History and all data above reviewed.  Patient examined.  I agree with the findings as above.   Echo pending but EF is mildly reduced with inferior hypokinesis by preliminary review although increased HR makes EF estimation more difficult. The patient exam reveals WUJ:[id number]  ,  Lungs: Clear  ,  Abd: Positive bowel sounds, no rebound no guarding, Ext No edema   .  All available labs, radiology testing, previous records  reviewed. Agree with documented assessment and plan. Chest pain:  Enzymes are not elevate and pain is non anginal.  Continue to cycle enzymes to see if there is a trend.  No objective evidence of ischemia and no work up planned at this time.    Atrial fib/flutter:  I will change to metoprolol tartrate and increase the dose and frequency to try for improved rate control.  Continue Heparin.  Need to discuss the timing of his urologic procedure.  If we can get rate control and enzymes remain flat without any objective evidence of ischemia I would not be suggesting other studies prior to surgery.  If no surgery is planned we will need to start DOAC if cleared from a urologic standpoint.   Fayrene Fearing Elease Swarm  10:02 AM  01/27/2023  Addendum:  I discussed the case with Dr. [facility].  They will have to reschedule his case until next week as an out patient.  He will be in atrial fib most likely as I am not planning cardioversion.  Even TEE/DCCV would delay surgery by at least a month.  I think it is most prudent to start Eliquis and plan his procedure.  We would stop the Eliquis if he has gross bleeding.  We can hold the Eliquis for two days prior to the procedure and hopefully restart soon afterward and then I will make, over the next weeks, a decision about continued rate control strategy or rhythm control with cardioversion after 3 weeks of Eliquis uninterrupted.  EF is mildly reduced.  I have no baseline.  He has had known CAD and no active evidence of ischemia.  Continue medical management.

## 2023-01-27 NOTE — Discharge Instructions (Signed)

## 2023-01-27 NOTE — Hospital Course (Addendum)
HPI:  Eddie Graham is a 88 y.o. male with medical history significant for CAD, COPD on room air, hypertension, depression, type 2 diabetes insulin-dependent being admitted to the hospital with new onset atrial fibrillation RVR.  Patient has a history of prostate cancer and multiple cystoscopies with urethral dilation he was scheduled for repeat cystoscopy today due to continued microscopic hematuria.  He came today to same-day surgery, prior to the procedure was noted to be tachycardic, found to be in atrial fibrillation/flutter.  He was given a dose of IV metoprolol heart rate improved to about 132 90s.  He remains in atrial fibrillation.  He has no known prior history of this.  He has had some sort of cardiac surgery around the year 2005, but unable to tell me what the surgery was.  He is accompanied today by his godson, who is partially translating for the patient as he is not completely fluent in [place].  Patient denies any recent dizziness, chest pain, palpitations, godson states that when they go to the store over the last few months he has noticed that the patient gets a little bit more winded with ambulation.  He denies any orthopnea, or lower extremity edema.   **Interim History Cardiology to start DOAC now that surgery has been rescheduled. Will need PT/OT to Evaluate and this is pending. Had significant Right sided Chest wall and Abdominal Pain so will trial Oxycodone and Diclofenac Gel and get CXR. Anticipate D/C in the next 24-48 hours.   Assessment and Plan:  Eddie Graham is a 88 y.o. male with medical history significant for CAD, COPD on room air, hypertension, depression, type 2 diabetes insulin-dependent being admitted to the hospital with new onset atrial fibrillation RVR.    A-fib RVR/ A Flutter, improved This is asymptomatic, patient is hemodynamically stable.  RVR has resolved after administration of IV metoprolol 2 mg. -Inpatient admission to  telemetry -Check electrolytes including magnesium -Checked TSH and was 0.858 -Obtained ECHOCARDIOGRAM and showed "Left ventricular ejection fraction, by estimation, is 40 to 45%. The left ventricle has mildly decreased function. The left ventricle demonstrates global hypokinesis. Left ventricular diastolic parameters are indeterminate. There is severe hypokinesis of the left ventricular, basal-mid inferoseptal wall, inferior wall and inferolateral wall. Right ventricular systolic function is mildly reduced. The right ventricular size is normal. There is normal pulmonary artery systolic pressure. Left atrial size was mildly dilated. Right atrial size was mildly dilated. The mitral valve is degenerative. Trivial mitral valve regurgitation. No evidence of mitral stenosis. The aortic valve is tricuspid. Aortic valve regurgitation is mild. No aortic stenosis is present. Aortic dilatation noted. 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 " -Appreciate Cardiology Consultation -Cardiology increased Metoprolol Succinate 100 mg po Daily  -Heart rates in the 100s and he does occasionally spike to 130s -Cardiology discussed with urology and the patient is undergoing rescheduling of his case and they feel that the patient is most likely to be in atrial fibrillation given that they are not in a cardiovert him and even that TEE/DCCV would delay surgery at least a month.  They feel that is most prudent to start Eliquis and plan his procedure and they are recommending stopping Eliquis if he has gross hematuria or bleeding.  They are recommending holding Eliquis for 2 days prior to the procedure and hopefully restarting soon afterwards and over next 3 weeks and we will make a decision about his rate control  strategy over the control strategy with cardioversion 3 weeks after Eliquis is uninterrupted. -PT/OT Evaluation and he is stable for home without  home health  Chest and Abdominal Pain, improved with oxycodone -Likely Musculoskeletal -Its left-sided and worse with inspiration and palpation -Start Oxycodone 5 mg and Diclofenac Gel and increase oxycodone to 5 to 10 mg -Given a dose of oral Dilaudid with improvement as well prior to discharge -CXR done and showed "Mildly progressive cardiomegaly. No acute abnormality. Stable mild chronic interstitial lung disease."   Insulin-Dependent Type 2 Diabetes  -Last Hemoglobin A1c 7.30 December 2022 -Carb controlled diet -Hold Glipizide 10 mg po BID for now along with Pioglitazone 30 mg po Daily and Semiglutide 14 mg po Daily  -C/w Long acting Insulin with Semglee and increased dose from 15 units sq at bedtime to 20 qHS -C/w Moderate sliding scale AC/HS -CBG and Glucose Recent Labs  Lab 01/28/23 0747 01/28/23 1136 01/28/23 1655 01/28/23 2101 01/29/23 0750 01/29/23 1129 01/29/23 1559  GLUCAP 122* 114* 123* 182* 156* 115* 142*   Recent Labs  Lab 01/20/23 1522 01/26/23 1253 01/27/23 0437 01/28/23 0522 01/29/23 0453  GLUCOSE 90 110* 117* 142* 132*   Hypertension -Continue home ARB, Toprol-XL now increased to 100 mg po Daily, Lasix -See above and cardiology in following and he is now stable for discharge   CKD Stage 3b -Presumably due to his Diabetes, appears to be at baseline -BUN/Cr Trend: Recent Labs  Lab 01/20/23 1522 01/26/23 1253 01/27/23 0437 01/28/23 0522 01/29/23 0453  BUN 29* 33* 37* 39* 40*  CREATININE 1.56* 1.94* 1.91* 1.71* 2.02*  -Avoid Nephrotoxic Medications, Contrast Dyes, Hypotension and Dehydration to Ensure Adequate Renal Perfusion and will need to Renally Adjust Meds -Continue to Monitor and Trend Renal Function carefully and repeat CMP within 1 week   GERD/GI Prophylaxis -Apparently not taking Pantoprazole 40 mg po BID anymore  -Resume if Necessary    Peripheral Neuropathy -C/w Duloxetine 60 mg po Daily and Gabapentin 100 p.o. 3 times daily    Hyperlipidemia -Lipid Panel done and showed a Total Chloseterol/HDL Ratio of 2.4, Cholesterol 88, HDL of 37, LDL of 27, TG of 121, VLDL of 24 -C/w Rousvastatin 40 mg po Daily  Bladder Cancer and Bulbar Urethral Stricture -Overdue for Surveillance Cystoscopy  -Diagnostic TURBT to be done and rescheduled as an outpatient  Hypoalbuminemia -Patient's Albumin Trend: Recent Labs  Lab 01/26/23 1253 01/28/23 0522 01/29/23 0453  ALBUMIN 3.3* 3.2* 3.2*  -Continue to Monitor and Trend and repeat CMP in the AM  Obesity -Complicates overall prognosis and care -Estimated body mass index is 32.28 kg/m as calculated from the following:   Height as of this encounter: 5\' 6"  (1.676 m).   Weight as of this encounter: 90.7 kg.  -Weight Loss and Dietary Counseling given

## 2023-01-28 ENCOUNTER — Telehealth: Payer: Self-pay | Admitting: Student

## 2023-01-28 ENCOUNTER — Inpatient Hospital Stay (HOSPITAL_COMMUNITY): Payer: 59

## 2023-01-28 DIAGNOSIS — I4892 Unspecified atrial flutter: Secondary | ICD-10-CM | POA: Diagnosis not present

## 2023-01-28 DIAGNOSIS — I4891 Unspecified atrial fibrillation: Secondary | ICD-10-CM | POA: Diagnosis not present

## 2023-01-28 DIAGNOSIS — R0789 Other chest pain: Secondary | ICD-10-CM | POA: Diagnosis not present

## 2023-01-28 DIAGNOSIS — R109 Unspecified abdominal pain: Secondary | ICD-10-CM | POA: Diagnosis not present

## 2023-01-28 LAB — GLUCOSE, CAPILLARY
Glucose-Capillary: 122 mg/dL — ABNORMAL HIGH (ref 70–99)
Glucose-Capillary: 114 mg/dL — ABNORMAL HIGH (ref 70–99)
Glucose-Capillary: 123 mg/dL — ABNORMAL HIGH (ref 70–99)
Glucose-Capillary: 182 mg/dL — ABNORMAL HIGH (ref 70–99)

## 2023-01-28 LAB — COMPREHENSIVE METABOLIC PANEL
ALT: 13 U/L (ref 0–44)
AST: 12 U/L — ABNORMAL LOW (ref 15–41)
Albumin: 3.2 g/dL — ABNORMAL LOW (ref 3.5–5.0)
Alkaline Phosphatase: 56 U/L (ref 38–126)
Anion gap: 9 (ref 5–15)
BUN: 39 mg/dL — ABNORMAL HIGH (ref 8–23)
CO2: 26 mmol/L (ref 22–32)
Calcium: 8 mg/dL — ABNORMAL LOW (ref 8.9–10.3)
Chloride: 102 mmol/L (ref 98–111)
Creatinine, Ser: 1.71 mg/dL — ABNORMAL HIGH (ref 0.61–1.24)
GFR, Estimated: 39 mL/min — ABNORMAL LOW (ref >60)
Glucose, Bld: 142 mg/dL — ABNORMAL HIGH (ref 70–99)
Potassium: 4.8 mmol/L (ref 3.5–5.1)
Sodium: 137 mmol/L (ref 135–145)
Total Bilirubin: 1.1 mg/dL (ref 0.3–1.2)
Total Protein: 6.2 g/dL — ABNORMAL LOW (ref 6.5–8.1)

## 2023-01-28 LAB — CBC WITH DIFFERENTIAL/PLATELET
Abs Immature Granulocytes: 0.05 10*3/uL (ref 0.00–0.07)
Basophils Absolute: 0 10*3/uL (ref 0.0–0.1)
Basophils Relative: 1 %
Eosinophils Absolute: 0.4 10*3/uL (ref 0.0–0.5)
Eosinophils Relative: 5 %
HCT: 47.5 % (ref 39.0–52.0)
Hemoglobin: 15.2 g/dL (ref 13.0–17.0)
Immature Granulocytes: 1 %
Lymphocytes Relative: 23 %
Lymphs Abs: 1.8 10*3/uL (ref 0.7–4.0)
MCH: 30 pg (ref 26.0–34.0)
MCHC: 32 g/dL (ref 30.0–36.0)
MCV: 93.9 fL (ref 80.0–100.0)
Monocytes Absolute: 0.6 10*3/uL (ref 0.1–1.0)
Monocytes Relative: 8 %
Neutro Abs: 4.8 10*3/uL (ref 1.7–7.7)
Neutrophils Relative %: 62 %
Platelets: 215 10*3/uL (ref 150–400)
RBC: 5.06 MIL/uL (ref 4.22–5.81)
RDW: 15.2 % (ref 11.5–15.5)
WBC: 7.6 10*3/uL (ref 4.0–10.5)
nRBC: 0 % (ref 0.0–0.2)

## 2023-01-28 LAB — LIPID PANEL
Cholesterol: 88 mg/dL (ref 0–200)
HDL: 37 mg/dL — ABNORMAL LOW (ref >40)
LDL Cholesterol: 27 mg/dL (ref 0–99)
Total CHOL/HDL Ratio: 2.4 {ratio}
Triglycerides: 121 mg/dL (ref <150)
VLDL: 24 mg/dL (ref 0–40)

## 2023-01-28 LAB — MAGNESIUM: Magnesium: 2.5 mg/dL — ABNORMAL HIGH (ref 1.7–2.4)

## 2023-01-28 LAB — PHOSPHORUS: Phosphorus: 4.1 mg/dL (ref 2.5–4.6)

## 2023-01-28 MED ORDER — FUROSEMIDE 40 MG PO TABS
40.0000 mg | ORAL_TABLET | Freq: Every day | ORAL | Status: DC
  Administered 2023-01-28 – 2023-01-29 (×2): 40 mg via ORAL
  Filled 2023-01-28 (×2): qty 1

## 2023-01-28 MED ORDER — DICLOFENAC SODIUM 1 % EX GEL
2.0000 g | Freq: Four times a day (QID) | CUTANEOUS | Status: DC
  Administered 2023-01-28 – 2023-01-29 (×6): 2 g via TOPICAL
  Filled 2023-01-28: qty 100

## 2023-01-28 MED ORDER — METOPROLOL SUCCINATE ER 100 MG PO TB24
100.0000 mg | ORAL_TABLET | Freq: Every day | ORAL | Status: DC
  Administered 2023-01-28 – 2023-01-29 (×2): 100 mg via ORAL
  Filled 2023-01-28 (×2): qty 1

## 2023-01-28 MED ORDER — OXYCODONE HCL 5 MG PO TABS
5.0000 mg | ORAL_TABLET | Freq: Four times a day (QID) | ORAL | Status: DC | PRN
  Administered 2023-01-28 – 2023-01-29 (×3): 5 mg via ORAL
  Filled 2023-01-28 (×3): qty 1

## 2023-01-28 NOTE — Telephone Encounter (Signed)
Transition of Care Follow-up Phone Call Request    Patient Name: [name] Date of Birth: [birth year] Date of Encounter: 01/28/2023  Primary Care Provider:  Andreas Blower., MD Primary Cardiologist:  Rollene Rotunda, MD  Trinda Pascal has been scheduled for a transition of care follow up appointment with a [facility] provider:  Azalee Course, PA-C, on 02/03/2023 at 2:45pm.   Per Dr. [name], "he might need help from our social workers and contact with his friend who gives him rides. "  Please reach out to [facility] within 48 hours of discharge to confirm appointment and review transition of care protocol questionnaire. Anticipated discharge date: Unclear. He is under the Internal Medicine service.   Corrin Parker, PA-C  01/28/2023, 11:51 AM

## 2023-01-28 NOTE — Plan of Care (Signed)
Pt is alert and oriented x 4. Up with assist in room due to unsteady gait. Fall mat in place. Bed alarm on. Pt med compliant. Received 1 dose of tramadol for pain down left side. Pt reports was effective at reducing pain level. Colostomy to left lower quad. Stoma pink. Stool sent to check for cdiff. MD notified of results and isolation enteric d/c'd. Vitals stable. No tele events this shift. Remains in Afib with controlled rate 80-90's.  Problem: Education: Goal: Ability to describe self-care measures that may prevent or decrease complications (Diabetes Survival Skills Education) will improve Outcome: Progressing Goal: Individualized Educational Video(s) Outcome: Progressing   Problem: Coping: Goal: Ability to adjust to condition or change in health will improve Outcome: Progressing   Problem: Fluid Volume: Goal: Ability to maintain a balanced intake and output will improve Outcome: Progressing   Problem: Health Behavior/Discharge Planning: Goal: Ability to identify and utilize available resources and services will improve Outcome: Progressing Goal: Ability to manage health-related needs will improve Outcome: Progressing   Problem: Metabolic: Goal: Ability to maintain appropriate glucose levels will improve Outcome: Progressing   Problem: Nutritional: Goal: Maintenance of adequate nutrition will improve Outcome: Progressing Goal: Progress toward achieving an optimal weight will improve Outcome: Progressing   Problem: Skin Integrity: Goal: Risk for impaired skin integrity will decrease Outcome: Progressing   Problem: Tissue Perfusion: Goal: Adequacy of tissue perfusion will improve Outcome: Progressing   Problem: Education: Goal: Knowledge of General Education information will improve Description: Including pain rating scale, medication(s)/side effects and non-pharmacologic comfort measures Outcome: Progressing   Problem: Health Behavior/Discharge Planning: Goal: Ability  to manage health-related needs will improve Outcome: Progressing   Problem: Clinical Measurements: Goal: Ability to maintain clinical measurements within normal limits will improve Outcome: Progressing Goal: Will remain free from infection Outcome: Progressing Goal: Diagnostic test results will improve Outcome: Progressing Goal: Respiratory complications will improve Outcome: Progressing Goal: Cardiovascular complication will be avoided Outcome: Progressing   Problem: Activity: Goal: Risk for activity intolerance will decrease Outcome: Progressing   Problem: Nutrition: Goal: Adequate nutrition will be maintained Outcome: Progressing   Problem: Coping: Goal: Level of anxiety will decrease Outcome: Progressing   Problem: Elimination: Goal: Will not experience complications related to bowel motility Outcome: Progressing Goal: Will not experience complications related to urinary retention Outcome: Progressing   Problem: Pain Managment: Goal: General experience of comfort will improve Outcome: Progressing   Problem: Safety: Goal: Ability to remain free from injury will improve Outcome: Progressing   Problem: Skin Integrity: Goal: Risk for impaired skin integrity will decrease Outcome: Progressing   Problem: Activity: Goal: Ability to tolerate increased activity will improve Outcome: Progressing   Problem: Cardiac: Goal: Ability to achieve and maintain adequate cardiopulmonary perfusion will improve Outcome: Progressing   Problem: Education: Goal: Knowledge of disease or condition will improve Outcome: Progressing Goal: Understanding of medication regimen will improve Outcome: Progressing Goal: Individualized Educational Video(s) Outcome: Progressing   Problem: Activity: Goal: Ability to tolerate increased activity will improve Outcome: Progressing   Problem: Cardiac: Goal: Ability to achieve and maintain adequate cardiopulmonary perfusion will  improve Outcome: Progressing   Problem: Health Behavior/Discharge Planning: Goal: Ability to safely manage health-related needs after discharge will improve Outcome: Progressing

## 2023-01-28 NOTE — Progress Notes (Signed)
PROGRESS NOTE    Eddie Graham  ZOX:[id number] DOB: 09-Sep-1934 DOA: 01/26/2023 PCP: Andreas Blower., MD   Brief Narrative:  HPI:  Eddie Graham is a 88 y.o. male with medical history significant for CAD, COPD on room air, hypertension, depression, type 2 diabetes insulin-dependent being admitted to the hospital with new onset atrial fibrillation RVR.  Patient has a history of prostate cancer and multiple cystoscopies with urethral dilation he was scheduled for repeat cystoscopy today due to continued microscopic hematuria.  He came today to same-day surgery, prior to the procedure was noted to be tachycardic, found to be in atrial fibrillation/flutter.  He was given a dose of IV metoprolol heart rate improved to about 132 90s.  He remains in atrial fibrillation.  He has no known prior history of this.  He has had some sort of cardiac surgery around the year 2005, but unable to tell me what the surgery was.  He is accompanied today by his godson, who is partially translating for the patient as he is not completely fluent in [place].  Patient denies any recent dizziness, chest pain, palpitations, godson states that when they go to the store over the last few months he has noticed that the patient gets a little bit more winded with ambulation.  He denies any orthopnea, or lower extremity edema.   **Interim History Cardiology to start DOAC now that surgery has been rescheduled. Will need PT/OT to Evaluate and this is pending. Had significant Right sided Chest wall and Abdominal Pain so will trial Oxycodone and Diclofenac Gel and get CXR. Anticipate D/C in the next 24-48 hours.   Assessment and Plan:  Eddie Graham is a 88 y.o. male with medical history significant for CAD, COPD on room air, hypertension, depression, type 2 diabetes insulin-dependent being admitted to the hospital with new onset atrial fibrillation RVR.    A-fib RVR/ A Flutter This is asymptomatic,  patient is hemodynamically stable.  RVR has resolved after administration of IV metoprolol 2 mg. -Inpatient admission to telemetry -Check electrolytes including magnesium -Checked TSH and was 0.858 -Obtained ECHOCARDIOGRAM and showed  -Appreciate Cardiology Consultation -Cardiology increased Metoprolol Succinate 100 mg po Daily  -Heart rates in the 100s and he does occasionally spike to 130s -Cardiology discussed with urology and the patient is undergoing rescheduling of his case and they feel that the patient is most likely to be in atrial fibrillation given that they are not in a cardiovert him and even that TEE/DCCV would delay surgery at least a month.  They feel that is most prudent to start Eliquis and plan his procedure and they are recommending stopping Eliquis if he has gross hematuria or bleeding.  They are recommending holding Eliquis for 2 days prior to the procedure and hopefully restarting soon afterwards and over next 3 weeks and we will make a decision about his rate control strategy over the control strategy with cardioversion 3 weeks after Eliquis is uninterrupted. -PT/OT Evaluation pending   Chest and Abdominal Pain -Likely Musculoskeletal -Its Right sided -Start Oxycodone 5 mg and Diclofenac Gel -CXR done and showed "Mildly progressive cardiomegaly. No acute abnormality. Stable mild chronic interstitial lung disease."   Insulin-Dependent Type 2 Diabetes  -Last Hemoglobin A1c 7.30 December 2022 -Carb controlled diet -Hold Glipizide 10 mg po BID for now along with Pioglitazone 30 mg po Daily and Semiglutide 14 mg po Daily  -C/w Long acting Insulin with Semglee and increased dose from 15 units sq at bedtime to 20 qHS -C/w  Lab 01/26/23 1253 01/27/23 0437 01/28/23 0522  NA 139 137 137  K 4.2 3.8 4.8  CL 105 102 102  CO2 25 25 26   GLUCOSE 110* 117* 142*  BUN 33* 37* 39*  CREATININE 1.94* 1.91* 1.71*  CALCIUM 8.5* 8.2* 8.0*  MG 2.4  --  2.5*  PHOS  --   --  4.1   GFR: Estimated Creatinine Clearance: 32.7 mL/min (A) (by C-G formula based on SCr of 1.71 mg/dL (H)). Liver Function Tests: Recent Labs  Lab 01/26/23 1253 01/28/23 0522  AST 12* 12*  ALT 13 13  ALKPHOS 62 56  BILITOT 0.9 1.1  PROT 6.4* 6.2*  ALBUMIN 3.3* 3.2*   No results for input(s): "LIPASE", "AMYLASE" in the last 168 hours. No results for input(s): "AMMONIA" in the last 168 hours. Coagulation Profile: Recent Labs  Lab 01/26/23 1253  INR 1.2   Cardiac Enzymes: No results for input(s): "CKTOTAL", "CKMB", "CKMBINDEX", "TROPONINI" in the last 168  hours. BNP (last 3 results) No results for input(s): "PROBNP" in the last 8760 hours. HbA1C: No results for input(s): "HGBA1C" in the last 72 hours. CBG: Recent Labs  Lab 01/27/23 1623 01/27/23 2057 01/28/23 0747 01/28/23 1136 01/28/23 1655  GLUCAP 131* 150* 122* 114* 123*   Lipid Profile: Recent Labs    01/28/23 0523  CHOL 88  HDL 37*  LDLCALC 27  TRIG 161  CHOLHDL 2.4   Thyroid Function Tests: Recent Labs    01/26/23 1253  TSH 0.858   Anemia Panel: No results for input(s): "VITAMINB12", "FOLATE", "FERRITIN", "TIBC", "IRON", "RETICCTPCT" in the last 72 hours. Sepsis Labs: No results for input(s): "PROCALCITON", "LATICACIDVEN" in the last 168 hours.  Recent Results (from the past 240 hour(s))  C Difficile Quick Screen w PCR reflex     Status: None   Collection Time: 01/27/23  9:31 PM   Specimen: STOOL  Result Value Ref Range Status   C Diff antigen NEGATIVE NEGATIVE Final   C Diff toxin NEGATIVE NEGATIVE Final   C Diff interpretation No C. difficile detected.  Final    Comment: Performed at [facility], [address], [place], [place] [address]    Radiology Studies: DG CHEST PORT 1 VIEW  Result Date: 01/28/2023 CLINICAL DATA:  Shortness of breath.  New onset atrial fibrillation. EXAM: PORTABLE CHEST 1 VIEW COMPARISON:  01/26/2023 FINDINGS: Poor inspiration. Mildly progressive enlargement of the cardiac silhouette. Tortuous and partially calcified thoracic aorta. Stable median sternotomy wires. Mild diffuse prominence of the interstitial markings without Kerley lines without significant change. No interval airspace consolidation, pulmonary vascular congestion or pleural fluid. Diffuse osteopenia. Cervical spine degenerative changes. IMPRESSION: 1. Mildly progressive cardiomegaly. 2. No acute abnormality. 3. Stable mild chronic interstitial lung disease. Electronically Signed   By: Beckie Salts M.D.   On: 01/28/2023 18:21   ECHOCARDIOGRAM  COMPLETE  Result Date: 01/27/2023    ECHOCARDIOGRAM REPORT   Patient Name:   Eddie Graham Date of Exam: 01/27/2023 Medical Rec #:  [id number]                 Height:       66.0 in Accession #:    [id number]                Weight:       200.0 lb Date of Birth:  1935/03/31                 BSA:  Lab 01/26/23 1253 01/27/23 0437 01/28/23 0522  NA 139 137 137  K 4.2 3.8 4.8  CL 105 102 102  CO2 25 25 26   GLUCOSE 110* 117* 142*  BUN 33* 37* 39*  CREATININE 1.94* 1.91* 1.71*  CALCIUM 8.5* 8.2* 8.0*  MG 2.4  --  2.5*  PHOS  --   --  4.1   GFR: Estimated Creatinine Clearance: 32.7 mL/min (A) (by C-G formula based on SCr of 1.71 mg/dL (H)). Liver Function Tests: Recent Labs  Lab 01/26/23 1253 01/28/23 0522  AST 12* 12*  ALT 13 13  ALKPHOS 62 56  BILITOT 0.9 1.1  PROT 6.4* 6.2*  ALBUMIN 3.3* 3.2*   No results for input(s): "LIPASE", "AMYLASE" in the last 168 hours. No results for input(s): "AMMONIA" in the last 168 hours. Coagulation Profile: Recent Labs  Lab 01/26/23 1253  INR 1.2   Cardiac Enzymes: No results for input(s): "CKTOTAL", "CKMB", "CKMBINDEX", "TROPONINI" in the last 168  hours. BNP (last 3 results) No results for input(s): "PROBNP" in the last 8760 hours. HbA1C: No results for input(s): "HGBA1C" in the last 72 hours. CBG: Recent Labs  Lab 01/27/23 1623 01/27/23 2057 01/28/23 0747 01/28/23 1136 01/28/23 1655  GLUCAP 131* 150* 122* 114* 123*   Lipid Profile: Recent Labs    01/28/23 0523  CHOL 88  HDL 37*  LDLCALC 27  TRIG 161  CHOLHDL 2.4   Thyroid Function Tests: Recent Labs    01/26/23 1253  TSH 0.858   Anemia Panel: No results for input(s): "VITAMINB12", "FOLATE", "FERRITIN", "TIBC", "IRON", "RETICCTPCT" in the last 72 hours. Sepsis Labs: No results for input(s): "PROCALCITON", "LATICACIDVEN" in the last 168 hours.  Recent Results (from the past 240 hour(s))  C Difficile Quick Screen w PCR reflex     Status: None   Collection Time: 01/27/23  9:31 PM   Specimen: STOOL  Result Value Ref Range Status   C Diff antigen NEGATIVE NEGATIVE Final   C Diff toxin NEGATIVE NEGATIVE Final   C Diff interpretation No C. difficile detected.  Final    Comment: Performed at [facility], [address], [place], [place] [address]    Radiology Studies: DG CHEST PORT 1 VIEW  Result Date: 01/28/2023 CLINICAL DATA:  Shortness of breath.  New onset atrial fibrillation. EXAM: PORTABLE CHEST 1 VIEW COMPARISON:  01/26/2023 FINDINGS: Poor inspiration. Mildly progressive enlargement of the cardiac silhouette. Tortuous and partially calcified thoracic aorta. Stable median sternotomy wires. Mild diffuse prominence of the interstitial markings without Kerley lines without significant change. No interval airspace consolidation, pulmonary vascular congestion or pleural fluid. Diffuse osteopenia. Cervical spine degenerative changes. IMPRESSION: 1. Mildly progressive cardiomegaly. 2. No acute abnormality. 3. Stable mild chronic interstitial lung disease. Electronically Signed   By: Beckie Salts M.D.   On: 01/28/2023 18:21   ECHOCARDIOGRAM  COMPLETE  Result Date: 01/27/2023    ECHOCARDIOGRAM REPORT   Patient Name:   Eddie Graham Date of Exam: 01/27/2023 Medical Rec #:  [id number]                 Height:       66.0 in Accession #:    [id number]                Weight:       200.0 lb Date of Birth:  1935/03/31                 BSA:  Lab 01/26/23 1253 01/27/23 0437 01/28/23 0522  NA 139 137 137  K 4.2 3.8 4.8  CL 105 102 102  CO2 25 25 26   GLUCOSE 110* 117* 142*  BUN 33* 37* 39*  CREATININE 1.94* 1.91* 1.71*  CALCIUM 8.5* 8.2* 8.0*  MG 2.4  --  2.5*  PHOS  --   --  4.1   GFR: Estimated Creatinine Clearance: 32.7 mL/min (A) (by C-G formula based on SCr of 1.71 mg/dL (H)). Liver Function Tests: Recent Labs  Lab 01/26/23 1253 01/28/23 0522  AST 12* 12*  ALT 13 13  ALKPHOS 62 56  BILITOT 0.9 1.1  PROT 6.4* 6.2*  ALBUMIN 3.3* 3.2*   No results for input(s): "LIPASE", "AMYLASE" in the last 168 hours. No results for input(s): "AMMONIA" in the last 168 hours. Coagulation Profile: Recent Labs  Lab 01/26/23 1253  INR 1.2   Cardiac Enzymes: No results for input(s): "CKTOTAL", "CKMB", "CKMBINDEX", "TROPONINI" in the last 168  hours. BNP (last 3 results) No results for input(s): "PROBNP" in the last 8760 hours. HbA1C: No results for input(s): "HGBA1C" in the last 72 hours. CBG: Recent Labs  Lab 01/27/23 1623 01/27/23 2057 01/28/23 0747 01/28/23 1136 01/28/23 1655  GLUCAP 131* 150* 122* 114* 123*   Lipid Profile: Recent Labs    01/28/23 0523  CHOL 88  HDL 37*  LDLCALC 27  TRIG 161  CHOLHDL 2.4   Thyroid Function Tests: Recent Labs    01/26/23 1253  TSH 0.858   Anemia Panel: No results for input(s): "VITAMINB12", "FOLATE", "FERRITIN", "TIBC", "IRON", "RETICCTPCT" in the last 72 hours. Sepsis Labs: No results for input(s): "PROCALCITON", "LATICACIDVEN" in the last 168 hours.  Recent Results (from the past 240 hour(s))  C Difficile Quick Screen w PCR reflex     Status: None   Collection Time: 01/27/23  9:31 PM   Specimen: STOOL  Result Value Ref Range Status   C Diff antigen NEGATIVE NEGATIVE Final   C Diff toxin NEGATIVE NEGATIVE Final   C Diff interpretation No C. difficile detected.  Final    Comment: Performed at [facility], [address], [place], [place] [address]    Radiology Studies: DG CHEST PORT 1 VIEW  Result Date: 01/28/2023 CLINICAL DATA:  Shortness of breath.  New onset atrial fibrillation. EXAM: PORTABLE CHEST 1 VIEW COMPARISON:  01/26/2023 FINDINGS: Poor inspiration. Mildly progressive enlargement of the cardiac silhouette. Tortuous and partially calcified thoracic aorta. Stable median sternotomy wires. Mild diffuse prominence of the interstitial markings without Kerley lines without significant change. No interval airspace consolidation, pulmonary vascular congestion or pleural fluid. Diffuse osteopenia. Cervical spine degenerative changes. IMPRESSION: 1. Mildly progressive cardiomegaly. 2. No acute abnormality. 3. Stable mild chronic interstitial lung disease. Electronically Signed   By: Beckie Salts M.D.   On: 01/28/2023 18:21   ECHOCARDIOGRAM  COMPLETE  Result Date: 01/27/2023    ECHOCARDIOGRAM REPORT   Patient Name:   Eddie Graham Date of Exam: 01/27/2023 Medical Rec #:  [id number]                 Height:       66.0 in Accession #:    [id number]                Weight:       200.0 lb Date of Birth:  1935/03/31                 BSA:  Lab 01/26/23 1253 01/27/23 0437 01/28/23 0522  NA 139 137 137  K 4.2 3.8 4.8  CL 105 102 102  CO2 25 25 26   GLUCOSE 110* 117* 142*  BUN 33* 37* 39*  CREATININE 1.94* 1.91* 1.71*  CALCIUM 8.5* 8.2* 8.0*  MG 2.4  --  2.5*  PHOS  --   --  4.1   GFR: Estimated Creatinine Clearance: 32.7 mL/min (A) (by C-G formula based on SCr of 1.71 mg/dL (H)). Liver Function Tests: Recent Labs  Lab 01/26/23 1253 01/28/23 0522  AST 12* 12*  ALT 13 13  ALKPHOS 62 56  BILITOT 0.9 1.1  PROT 6.4* 6.2*  ALBUMIN 3.3* 3.2*   No results for input(s): "LIPASE", "AMYLASE" in the last 168 hours. No results for input(s): "AMMONIA" in the last 168 hours. Coagulation Profile: Recent Labs  Lab 01/26/23 1253  INR 1.2   Cardiac Enzymes: No results for input(s): "CKTOTAL", "CKMB", "CKMBINDEX", "TROPONINI" in the last 168  hours. BNP (last 3 results) No results for input(s): "PROBNP" in the last 8760 hours. HbA1C: No results for input(s): "HGBA1C" in the last 72 hours. CBG: Recent Labs  Lab 01/27/23 1623 01/27/23 2057 01/28/23 0747 01/28/23 1136 01/28/23 1655  GLUCAP 131* 150* 122* 114* 123*   Lipid Profile: Recent Labs    01/28/23 0523  CHOL 88  HDL 37*  LDLCALC 27  TRIG 161  CHOLHDL 2.4   Thyroid Function Tests: Recent Labs    01/26/23 1253  TSH 0.858   Anemia Panel: No results for input(s): "VITAMINB12", "FOLATE", "FERRITIN", "TIBC", "IRON", "RETICCTPCT" in the last 72 hours. Sepsis Labs: No results for input(s): "PROCALCITON", "LATICACIDVEN" in the last 168 hours.  Recent Results (from the past 240 hour(s))  C Difficile Quick Screen w PCR reflex     Status: None   Collection Time: 01/27/23  9:31 PM   Specimen: STOOL  Result Value Ref Range Status   C Diff antigen NEGATIVE NEGATIVE Final   C Diff toxin NEGATIVE NEGATIVE Final   C Diff interpretation No C. difficile detected.  Final    Comment: Performed at [facility], [address], [place], [place] [address]    Radiology Studies: DG CHEST PORT 1 VIEW  Result Date: 01/28/2023 CLINICAL DATA:  Shortness of breath.  New onset atrial fibrillation. EXAM: PORTABLE CHEST 1 VIEW COMPARISON:  01/26/2023 FINDINGS: Poor inspiration. Mildly progressive enlargement of the cardiac silhouette. Tortuous and partially calcified thoracic aorta. Stable median sternotomy wires. Mild diffuse prominence of the interstitial markings without Kerley lines without significant change. No interval airspace consolidation, pulmonary vascular congestion or pleural fluid. Diffuse osteopenia. Cervical spine degenerative changes. IMPRESSION: 1. Mildly progressive cardiomegaly. 2. No acute abnormality. 3. Stable mild chronic interstitial lung disease. Electronically Signed   By: Beckie Salts M.D.   On: 01/28/2023 18:21   ECHOCARDIOGRAM  COMPLETE  Result Date: 01/27/2023    ECHOCARDIOGRAM REPORT   Patient Name:   Eddie Graham Date of Exam: 01/27/2023 Medical Rec #:  [id number]                 Height:       66.0 in Accession #:    [id number]                Weight:       200.0 lb Date of Birth:  1935/03/31                 BSA:  Lab 01/26/23 1253 01/27/23 0437 01/28/23 0522  NA 139 137 137  K 4.2 3.8 4.8  CL 105 102 102  CO2 25 25 26   GLUCOSE 110* 117* 142*  BUN 33* 37* 39*  CREATININE 1.94* 1.91* 1.71*  CALCIUM 8.5* 8.2* 8.0*  MG 2.4  --  2.5*  PHOS  --   --  4.1   GFR: Estimated Creatinine Clearance: 32.7 mL/min (A) (by C-G formula based on SCr of 1.71 mg/dL (H)). Liver Function Tests: Recent Labs  Lab 01/26/23 1253 01/28/23 0522  AST 12* 12*  ALT 13 13  ALKPHOS 62 56  BILITOT 0.9 1.1  PROT 6.4* 6.2*  ALBUMIN 3.3* 3.2*   No results for input(s): "LIPASE", "AMYLASE" in the last 168 hours. No results for input(s): "AMMONIA" in the last 168 hours. Coagulation Profile: Recent Labs  Lab 01/26/23 1253  INR 1.2   Cardiac Enzymes: No results for input(s): "CKTOTAL", "CKMB", "CKMBINDEX", "TROPONINI" in the last 168  hours. BNP (last 3 results) No results for input(s): "PROBNP" in the last 8760 hours. HbA1C: No results for input(s): "HGBA1C" in the last 72 hours. CBG: Recent Labs  Lab 01/27/23 1623 01/27/23 2057 01/28/23 0747 01/28/23 1136 01/28/23 1655  GLUCAP 131* 150* 122* 114* 123*   Lipid Profile: Recent Labs    01/28/23 0523  CHOL 88  HDL 37*  LDLCALC 27  TRIG 161  CHOLHDL 2.4   Thyroid Function Tests: Recent Labs    01/26/23 1253  TSH 0.858   Anemia Panel: No results for input(s): "VITAMINB12", "FOLATE", "FERRITIN", "TIBC", "IRON", "RETICCTPCT" in the last 72 hours. Sepsis Labs: No results for input(s): "PROCALCITON", "LATICACIDVEN" in the last 168 hours.  Recent Results (from the past 240 hour(s))  C Difficile Quick Screen w PCR reflex     Status: None   Collection Time: 01/27/23  9:31 PM   Specimen: STOOL  Result Value Ref Range Status   C Diff antigen NEGATIVE NEGATIVE Final   C Diff toxin NEGATIVE NEGATIVE Final   C Diff interpretation No C. difficile detected.  Final    Comment: Performed at [facility], [address], [place], [place] [address]    Radiology Studies: DG CHEST PORT 1 VIEW  Result Date: 01/28/2023 CLINICAL DATA:  Shortness of breath.  New onset atrial fibrillation. EXAM: PORTABLE CHEST 1 VIEW COMPARISON:  01/26/2023 FINDINGS: Poor inspiration. Mildly progressive enlargement of the cardiac silhouette. Tortuous and partially calcified thoracic aorta. Stable median sternotomy wires. Mild diffuse prominence of the interstitial markings without Kerley lines without significant change. No interval airspace consolidation, pulmonary vascular congestion or pleural fluid. Diffuse osteopenia. Cervical spine degenerative changes. IMPRESSION: 1. Mildly progressive cardiomegaly. 2. No acute abnormality. 3. Stable mild chronic interstitial lung disease. Electronically Signed   By: Beckie Salts M.D.   On: 01/28/2023 18:21   ECHOCARDIOGRAM  COMPLETE  Result Date: 01/27/2023    ECHOCARDIOGRAM REPORT   Patient Name:   Eddie Graham Date of Exam: 01/27/2023 Medical Rec #:  [id number]                 Height:       66.0 in Accession #:    [id number]                Weight:       200.0 lb Date of Birth:  1935/03/31                 BSA:  PROGRESS NOTE    Eddie Graham  ZOX:[id number] DOB: 09-Sep-1934 DOA: 01/26/2023 PCP: Andreas Blower., MD   Brief Narrative:  HPI:  Eddie Graham is a 88 y.o. male with medical history significant for CAD, COPD on room air, hypertension, depression, type 2 diabetes insulin-dependent being admitted to the hospital with new onset atrial fibrillation RVR.  Patient has a history of prostate cancer and multiple cystoscopies with urethral dilation he was scheduled for repeat cystoscopy today due to continued microscopic hematuria.  He came today to same-day surgery, prior to the procedure was noted to be tachycardic, found to be in atrial fibrillation/flutter.  He was given a dose of IV metoprolol heart rate improved to about 132 90s.  He remains in atrial fibrillation.  He has no known prior history of this.  He has had some sort of cardiac surgery around the year 2005, but unable to tell me what the surgery was.  He is accompanied today by his godson, who is partially translating for the patient as he is not completely fluent in [place].  Patient denies any recent dizziness, chest pain, palpitations, godson states that when they go to the store over the last few months he has noticed that the patient gets a little bit more winded with ambulation.  He denies any orthopnea, or lower extremity edema.   **Interim History Cardiology to start DOAC now that surgery has been rescheduled. Will need PT/OT to Evaluate and this is pending. Had significant Right sided Chest wall and Abdominal Pain so will trial Oxycodone and Diclofenac Gel and get CXR. Anticipate D/C in the next 24-48 hours.   Assessment and Plan:  Eddie Graham is a 88 y.o. male with medical history significant for CAD, COPD on room air, hypertension, depression, type 2 diabetes insulin-dependent being admitted to the hospital with new onset atrial fibrillation RVR.    A-fib RVR/ A Flutter This is asymptomatic,  patient is hemodynamically stable.  RVR has resolved after administration of IV metoprolol 2 mg. -Inpatient admission to telemetry -Check electrolytes including magnesium -Checked TSH and was 0.858 -Obtained ECHOCARDIOGRAM and showed  -Appreciate Cardiology Consultation -Cardiology increased Metoprolol Succinate 100 mg po Daily  -Heart rates in the 100s and he does occasionally spike to 130s -Cardiology discussed with urology and the patient is undergoing rescheduling of his case and they feel that the patient is most likely to be in atrial fibrillation given that they are not in a cardiovert him and even that TEE/DCCV would delay surgery at least a month.  They feel that is most prudent to start Eliquis and plan his procedure and they are recommending stopping Eliquis if he has gross hematuria or bleeding.  They are recommending holding Eliquis for 2 days prior to the procedure and hopefully restarting soon afterwards and over next 3 weeks and we will make a decision about his rate control strategy over the control strategy with cardioversion 3 weeks after Eliquis is uninterrupted. -PT/OT Evaluation pending   Chest and Abdominal Pain -Likely Musculoskeletal -Its Right sided -Start Oxycodone 5 mg and Diclofenac Gel -CXR done and showed "Mildly progressive cardiomegaly. No acute abnormality. Stable mild chronic interstitial lung disease."   Insulin-Dependent Type 2 Diabetes  -Last Hemoglobin A1c 7.30 December 2022 -Carb controlled diet -Hold Glipizide 10 mg po BID for now along with Pioglitazone 30 mg po Daily and Semiglutide 14 mg po Daily  -C/w Long acting Insulin with Semglee and increased dose from 15 units sq at bedtime to 20 qHS -C/w

## 2023-01-28 NOTE — Plan of Care (Signed)
  Problem: Education: Goal: Ability to describe self-care measures that may prevent or decrease complications (Diabetes Survival Skills Education) will improve Outcome: Progressing   Problem: Coping: Goal: Ability to adjust to condition or change in health will improve Outcome: Progressing   Problem: Fluid Volume: Goal: Ability to maintain a balanced intake and output will improve Outcome: Progressing   Problem: Health Behavior/Discharge Planning: Goal: Ability to identify and utilize available resources and services will improve Outcome: Progressing Goal: Ability to manage health-related needs will improve Outcome: Progressing   Problem: Nutritional: Goal: Maintenance of adequate nutrition will improve Outcome: Progressing   Problem: Education: Goal: Knowledge of General Education information will improve Description: Including pain rating scale, medication(s)/side effects and non-pharmacologic comfort measures Outcome: Progressing   Problem: Health Behavior/Discharge Planning: Goal: Ability to manage health-related needs will improve Outcome: Progressing   Problem: Activity: Goal: Risk for activity intolerance will decrease Outcome: Progressing   Problem: Nutrition: Goal: Adequate nutrition will be maintained Outcome: Progressing   Problem: Coping: Goal: Level of anxiety will decrease Outcome: Progressing   Problem: Activity: Goal: Ability to tolerate increased activity will improve Outcome: Progressing

## 2023-01-28 NOTE — Progress Notes (Signed)
Progress Note  Patient Name: Eddie Graham Date of Encounter: 01/28/2023  Primary Cardiologist:   Rollene Rotunda, MD   Subjective   Denies any acute symptoms.  He seems to have some baseline dyspnea.    Inpatient Medications    Scheduled Meds:  apixaban  2.5 mg Oral BID   DULoxetine  60 mg Oral Daily   gabapentin  100 mg Oral TID   insulin aspart  0-15 Units Subcutaneous TID WC   insulin aspart  0-5 Units Subcutaneous QHS   insulin glargine-yfgn  20 Units Subcutaneous QHS   metoprolol tartrate  25 mg Oral Q6H   rosuvastatin  40 mg Oral Daily   Continuous Infusions:  PRN Meds: acetaminophen **OR** acetaminophen, albuterol, ondansetron **OR** ondansetron (ZOFRAN) IV, traMADol, traZODone   Vital Signs    Vitals:   01/27/23 1100 01/27/23 2025 01/27/23 2329 01/28/23 0414  BP: 128/78 (!) 117/58 117/74 108/69  Pulse: 67 100 89 92  Resp: 16  20 18   Temp: 97.6 F (36.4 C) 97.7 F (36.5 C) 97.6 F (36.4 C) 98.5 F (36.9 C)  TempSrc: Oral Oral    SpO2:  100% 97% 100%  Weight:      Height:        Intake/Output Summary (Last 24 hours) at 01/28/2023 1042 Last data filed at 01/27/2023 1701 Gross per 24 hour  Intake 240 ml  Output 650 ml  Net -410 ml   Filed Weights   01/26/23 0929  Weight: 90.7 kg    Telemetry    Atrial fib with controlled rate - Personally Reviewed  ECG    NA - Personally Reviewed  Physical Exam   GEN: No acute distress.   Neck: No  JVD Cardiac: Irregular RR, 2/6 apical  murmurs, rubs, or gallops.  Respiratory:    Decreased breath sounds at the bases.  to auscultation bilaterally. GI: Soft, nontender, non-distended  MS: No  edema; No deformity. Neuro:  Nonfocal  Psych: Normal affect   Labs    Chemistry Recent Labs  Lab 01/26/23 1253 01/27/23 0437 01/28/23 0522  NA 139 137 137  K 4.2 3.8 4.8  CL 105 102 102  CO2 25 25 26   GLUCOSE 110* 117* 142*  BUN 33* 37* 39*  CREATININE 1.94* 1.91* 1.71*  CALCIUM 8.5*  8.2* 8.0*  PROT 6.4*  --  6.2*  ALBUMIN 3.3*  --  3.2*  AST 12*  --  12*  ALT 13  --  13  ALKPHOS 62  --  56  BILITOT 0.9  --  1.1  GFRNONAA 33* 34* 39*  ANIONGAP 9 10 9      Hematology Recent Labs  Lab 01/26/23 1253 01/27/23 0437 01/28/23 0522  WBC 8.9 8.6 7.6  RBC 5.38 5.03 5.06  HGB 15.6 14.7 15.2  HCT 49.6 46.6 47.5  MCV 92.2 92.6 93.9  MCH 29.0 29.2 30.0  MCHC 31.5 31.5 32.0  RDW 15.5 15.4 15.2  PLT 202 204 215    Cardiac EnzymesNo results for input(s): "TROPONINI" in the last 168 hours. No results for input(s): "TROPIPOC" in the last 168 hours.   BNPNo results for input(s): "BNP", "PROBNP" in the last 168 hours.   DDimer No results for input(s): "DDIMER" in the last 168 hours.   Radiology    ECHOCARDIOGRAM COMPLETE  Result Date: 01/27/2023    ECHOCARDIOGRAM REPORT   Patient Name:   Eddie Graham Date of Exam: 01/27/2023 Medical Rec #:  [id number]  Height:       66.0 in Accession #:    [id number]                Weight:       200.0 lb Date of Birth:  10-19-1934                 BSA:          2.000 m Patient Age:    88 years                  BP:           120/68 mmHg Patient Gender: M                         HR:           117 bpm. Exam Location:  Inpatient Procedure: 2D Echo, Cardiac Doppler and Color Doppler Indications:    I48.91* Unspeicified atrial fibrillation; R07.9* Chest pain,                 unspecified  History:        Patient has no prior history of Echocardiogram examinations.                 CAD, Abnormal ECG; Risk Factors:Dyslipidemia.  Sonographer:    Sheralyn Boatman RDCS Referring Phys: [id number] M[facility]  Sonographer Comments: Technically difficult study due to poor echo windows. IMPRESSIONS  1. Left ventricular ejection fraction, by estimation, is 40 to 45%. The left ventricle has mildly decreased function. The left ventricle demonstrates global hypokinesis. Left ventricular diastolic parameters are indeterminate. There is severe  hypokinesis of the left ventricular, basal-mid inferoseptal wall, inferior wall and inferolateral wall.  2. Right ventricular systolic function is mildly reduced. The right ventricular size is normal. There is normal pulmonary artery systolic pressure.  3. Left atrial size was mildly dilated.  4. Right atrial size was mildly dilated.  5. The mitral valve is degenerative. Trivial mitral valve regurgitation. No evidence of mitral stenosis.  6. The aortic valve is tricuspid. Aortic valve regurgitation is mild. No aortic stenosis is present.  7. Aortic dilatation noted. 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 FINDINGS  Left Ventricle: Left ventricular ejection fraction, by estimation, is 40 to 45%. The left ventricle has mildly decreased function. The left ventricle demonstrates global hypokinesis. Severe hypokinesis of the left ventricular, basal-mid inferoseptal wall, inferior wall and inferolateral wall. The left ventricular internal cavity size was normal in size. There is no left ventricular hypertrophy. Left ventricular diastolic function could not be evaluated due to atrial fibrillation. Left ventricular diastolic parameters are indeterminate.  LV Wall Scoring: The inferior wall, posterior wall, mid inferoseptal segment, and basal inferoseptal segment are hypokinetic. Right Ventricle: The right ventricular size is normal. No increase in right ventricular wall thickness. Right ventricular systolic function is mildly reduced. There is normal pulmonary artery systolic pressure. The tricuspid regurgitant velocity is 1.87 m/s, and with an assumed right atrial pressure of 3 mmHg, the estimated right ventricular systolic pressure is 17.0 mmHg. Left Atrium: Left atrial size was mildly dilated. Right Atrium: Right atrial size was mildly dilated. Pericardium: There is no evidence of pericardial effusion. Presence of epicardial fat layer. Mitral  Valve: The mitral valve is degenerative in appearance. Mild mitral annular calcification. Trivial mitral valve regurgitation. No evidence of mitral valve stenosis. Tricuspid Valve:  The tricuspid valve is normal in structure. Tricuspid valve regurgitation is trivial. No evidence of tricuspid stenosis. Aortic Valve: The aortic valve is tricuspid. Aortic valve regurgitation is mild. No aortic stenosis is present. Pulmonic Valve: The pulmonic valve was normal in structure. Pulmonic valve regurgitation is not visualized. No evidence of pulmonic stenosis. Aorta: Aortic dilatation noted. 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 IAS/Shunts: No atrial level shunt detected by color flow Doppler.  LEFT VENTRICLE PLAX 2D LVIDd:         4.60 cm LVIDs:         4.00 cm LV PW:         1.10 cm LV IVS:        1.00 cm LVOT diam:     2.40 cm LV SV:         51 LV SV Index:   25 LVOT Area:     4.52 cm  LV Volumes (MOD) LV vol d, MOD A2C: 38.8 ml LV vol d, MOD A4C: 60.1 ml LV vol s, MOD A2C: 19.4 ml LV vol s, MOD A4C: 31.3 ml LV SV MOD A2C:     19.4 ml LV SV MOD A4C:     60.1 ml LV SV MOD BP:      24.6 ml RIGHT VENTRICLE            IVC RV S prime:     8.16 cm/s  IVC diam: 2.00 cm TAPSE (M-mode): 1.7 cm LEFT ATRIUM             Index        RIGHT ATRIUM           Index LA diam:        4.00 cm 2.00 cm/m   RA Area:     15.40 cm LA Vol (A2C):   33.8 ml 16.90 ml/m  RA Volume:   35.00 ml  17.50 ml/m LA Vol (A4C):   33.7 ml 16.85 ml/m LA Biplane Vol: 35.3 ml 17.65 ml/m  AORTIC VALVE LVOT Vmax:   91.70 cm/s LVOT Vmean:  57.000 cm/s LVOT VTI:    0.112 m  AORTA Ao Root diam: 3.80 cm Ao Asc diam:  4.00 cm MITRAL VALVE               TRICUSPID VALVE MV Area (PHT): 4.68 cm    TR Peak grad:   14.0 mmHg MV Decel Time: 162 msec    TR Vmax:        187.00 cm/s MV E velocity: 59.40 cm/s MV A velocity: 84.50 cm/s  SHUNTS MV E/A ratio:  0.70        Systemic VTI:  0.11 m                             Systemic Diam: 2.40 cm Rachelle Hora Croitoru MD Electronically signed by Thurmon Fair MD Signature Date/Time: 01/27/2023/11:43:43 AM    Final    DG CHEST PORT 1 VIEW  Result Date: 01/26/2023 CLINICAL DATA:  New onset atrial fibrillation EXAM: PORTABLE CHEST 1 VIEW COMPARISON:  03/10/2022 FINDINGS: Post sternotomy changes. Upper normal cardiac size. Suspicion of small left effusion or focal airspace disease at left CP angle. Aortic atherosclerosis. No pneumothorax. IMPRESSION: Suspicion of small left effusion or focal airspace disease at left CP angle. Electronically Signed   By: Adrian Prows.D.  On: 01/26/2023 15:22    Cardiac Studies   ECHO:  1. Left ventricular ejection fraction, by estimation, is 40 to 45%. The  left ventricle has mildly decreased function. The left ventricle  demonstrates global hypokinesis. Left ventricular diastolic parameters are  indeterminate. There is severe  hypokinesis of the left ventricular, basal-mid inferoseptal wall, inferior  wall and inferolateral wall.   2. Right ventricular systolic function is mildly reduced. The right  ventricular size is normal. There is normal pulmonary artery systolic  pressure.   3. Left atrial size was mildly dilated.   4. Right atrial size was mildly dilated.   5. The mitral valve is degenerative. Trivial mitral valve regurgitation.  No evidence of mitral stenosis.   6. The aortic valve is tricuspid. Aortic valve regurgitation is mild. No  aortic stenosis is present.   7. Aortic dilatation noted. There is dilatation of the aortic root,  measuring 38 mm. There is dilatation of the ascending aorta, measuring 40  mm.   Patient Profile     88 y.o. male with a hx of CABG in 2011, chronic diverticulitis, urethral structure, history of high-grade bladder cancer, anemia, AAA, COPD, CKD, HTN who is being seen 01/26/2023 for the evaluation of atrial flutter at the request of Dr. Arita Miss.   Assessment & Plan    New  Atrial Flutter:   Plan as outlined  in the note yesterday.  I have started Elqiuis and he is on metoprolol for rate control.  I would consolidate this and send him home on Toprol XL 100 mg daily and Eliquis.  He can stop the Elqiuis two days before his urologic procedure.  We will arrange follow up for about 7 days in the clinic.      Chest Tightness :  Non anginal pain and no evidence of ischemia.  Reproducible with palpation.  Known CAD so he needs continued risk reduction.     Shortness of breath :  Baseline.  He has an ischemic cardiomyopathy as above.  I don't have a baseline EF.   I would resume his Lasix at discharge and asked that he get a dose before discharge.    Ambulate in the hallway before discharge and check his RA sats.    CAD s/p CABG :  See above.  Stopping Plavix with Eliquis.  No ASA with DOAC in this situation.     HTN:  This is being treated in the context of treating his CAD and cardiomyopathy.   Avoiding ARB/ARNi with CKD.  BP is soft with beta blocker so no further med titration.    CKD:  Need to follow creat closely as an out patient.  Seems to be at baseline.  CKD IIIB.    Note:  I used the translator app but even with this his understanding seems to be very limited.    For questions or updates, please contact [facility] Please consult [email or web address] for contact info under Cardiology/STEMI.   Signed, Rollene Rotunda, MD  01/28/2023, 10:42 AM

## 2023-01-28 NOTE — Telephone Encounter (Signed)
Patient currently admitted

## 2023-01-29 ENCOUNTER — Other Ambulatory Visit: Payer: Self-pay | Admitting: Urology

## 2023-01-29 ENCOUNTER — Telehealth: Payer: Self-pay | Admitting: Cardiology

## 2023-01-29 DIAGNOSIS — R0782 Intercostal pain: Secondary | ICD-10-CM

## 2023-01-29 DIAGNOSIS — I4892 Unspecified atrial flutter: Secondary | ICD-10-CM | POA: Diagnosis not present

## 2023-01-29 DIAGNOSIS — I4891 Unspecified atrial fibrillation: Secondary | ICD-10-CM | POA: Diagnosis not present

## 2023-01-29 LAB — CBC WITH DIFFERENTIAL/PLATELET
Abs Immature Granulocytes: 0.11 10*3/uL — ABNORMAL HIGH (ref 0.00–0.07)
Basophils Absolute: 0 10*3/uL (ref 0.0–0.1)
Basophils Relative: 0 %
Eosinophils Absolute: 0.4 10*3/uL (ref 0.0–0.5)
Eosinophils Relative: 5 %
HCT: 45.1 % (ref 39.0–52.0)
Hemoglobin: 14.4 g/dL (ref 13.0–17.0)
Immature Granulocytes: 1 %
Lymphocytes Relative: 25 %
Lymphs Abs: 1.9 10*3/uL (ref 0.7–4.0)
MCH: 29.6 pg (ref 26.0–34.0)
MCHC: 31.9 g/dL (ref 30.0–36.0)
MCV: 92.8 fL (ref 80.0–100.0)
Monocytes Absolute: 0.6 10*3/uL (ref 0.1–1.0)
Monocytes Relative: 8 %
Neutro Abs: 4.7 10*3/uL (ref 1.7–7.7)
Neutrophils Relative %: 61 %
Platelets: 206 10*3/uL (ref 150–400)
RBC: 4.86 MIL/uL (ref 4.22–5.81)
RDW: 15 % (ref 11.5–15.5)
WBC: 7.8 10*3/uL (ref 4.0–10.5)
nRBC: 0 % (ref 0.0–0.2)

## 2023-01-29 LAB — COMPREHENSIVE METABOLIC PANEL
ALT: 14 U/L (ref 0–44)
AST: 13 U/L — ABNORMAL LOW (ref 15–41)
Albumin: 3.2 g/dL — ABNORMAL LOW (ref 3.5–5.0)
Alkaline Phosphatase: 55 U/L (ref 38–126)
Anion gap: 10 (ref 5–15)
BUN: 40 mg/dL — ABNORMAL HIGH (ref 8–23)
CO2: 22 mmol/L (ref 22–32)
Calcium: 8.4 mg/dL — ABNORMAL LOW (ref 8.9–10.3)
Chloride: 100 mmol/L (ref 98–111)
Creatinine, Ser: 2.02 mg/dL — ABNORMAL HIGH (ref 0.61–1.24)
GFR, Estimated: 32 mL/min — ABNORMAL LOW (ref >60)
Glucose, Bld: 132 mg/dL — ABNORMAL HIGH (ref 70–99)
Potassium: 4.1 mmol/L (ref 3.5–5.1)
Sodium: 132 mmol/L — ABNORMAL LOW (ref 135–145)
Total Bilirubin: 1.3 mg/dL — ABNORMAL HIGH (ref 0.3–1.2)
Total Protein: 6.2 g/dL — ABNORMAL LOW (ref 6.5–8.1)

## 2023-01-29 LAB — GLUCOSE, CAPILLARY
Glucose-Capillary: 156 mg/dL — ABNORMAL HIGH (ref 70–99)
Glucose-Capillary: 115 mg/dL — ABNORMAL HIGH (ref 70–99)
Glucose-Capillary: 142 mg/dL — ABNORMAL HIGH (ref 70–99)

## 2023-01-29 LAB — PHOSPHORUS: Phosphorus: 4.1 mg/dL (ref 2.5–4.6)

## 2023-01-29 LAB — MAGNESIUM: Magnesium: 2.4 mg/dL (ref 1.7–2.4)

## 2023-01-29 MED ORDER — HYDROMORPHONE HCL 2 MG PO TABS
1.0000 mg | ORAL_TABLET | Freq: Once | ORAL | Status: AC
  Administered 2023-01-29: 1 mg via ORAL
  Filled 2023-01-29: qty 1

## 2023-01-29 MED ORDER — OXYCODONE HCL 5 MG PO TABS: 5.0000 mg | ORAL_TABLET | Freq: Four times a day (QID) | ORAL | 0 refills | Status: DC | PRN

## 2023-01-29 MED ORDER — METOPROLOL SUCCINATE ER 100 MG PO TB24: 100.0000 mg | ORAL_TABLET | Freq: Every day | ORAL | 0 refills | Status: DC

## 2023-01-29 MED ORDER — OXYCODONE HCL 5 MG PO TABS
5.0000 mg | ORAL_TABLET | Freq: Four times a day (QID) | ORAL | Status: DC | PRN
  Administered 2023-01-29: 5 mg via ORAL
  Filled 2023-01-29: qty 1

## 2023-01-29 MED ORDER — APIXABAN 2.5 MG PO TABS: 2.5000 mg | ORAL_TABLET | Freq: Two times a day (BID) | ORAL | 0 refills | Status: DC

## 2023-01-29 MED ORDER — LIDOCAINE 5 % EX PTCH
1.0000 | MEDICATED_PATCH | CUTANEOUS | Status: DC
  Administered 2023-01-29: 1 via TRANSDERMAL
  Filled 2023-01-29: qty 1

## 2023-01-29 MED ORDER — OXYCODONE HCL 5 MG PO TABS: 5.0000 mg | ORAL_TABLET | Freq: Four times a day (QID) | ORAL | Status: DC | PRN

## 2023-01-29 MED ORDER — ONDANSETRON HCL 4 MG PO TABS: 4.0000 mg | ORAL_TABLET | Freq: Four times a day (QID) | ORAL | 0 refills | Status: DC | PRN

## 2023-01-29 MED ORDER — FUROSEMIDE 20 MG PO TABS: 20.0000 mg | ORAL_TABLET | Freq: Every day | ORAL | 0 refills | Status: DC

## 2023-01-29 MED ORDER — OXYCODONE HCL 5 MG PO TABS: 5.0000 mg | ORAL_TABLET | Freq: Four times a day (QID) | ORAL | 0 refills | Status: AC | PRN

## 2023-01-29 MED ORDER — HYDROMORPHONE HCL 1 MG/ML IJ SOLN: 1.0000 mg | Freq: Once | INTRAMUSCULAR | Status: DC

## 2023-01-29 NOTE — Telephone Encounter (Signed)
Name: Eddie Graham  DOB: 09-28-1934  MRN: [id number]  Primary Cardiologist: Rollene Rotunda, MD  Chart reviewed as part of pre-operative protocol coverage. The patient has an upcoming visit scheduled with Damian Leavell, PA  on 02/03/2023 at  which time clearance can be addressed in case there are any issues that would impact surgical recommendations.  Procedure Is not scheduled until 02/09/2023 as below. I added preop FYI to appointment note so that provider is aware to address at time of outpatient visit.  Per office protocol the cardiology provider should forward their finalized clearance decision and recommendations regarding antiplatelet therapy to the requesting party below.    CHA2DS2-VASc Score = 6   This indicates a 9.7% annual risk of stroke. The patient's score is based upon: CHF History: 1 HTN History: 1 Diabetes History: 1 Stroke History: 0 Vascular Disease History: 1 Age Score: 2 Gender Score: 0 .   I will route this message as FYI to requesting party and remove this message from the preop box as separate preop APP input not needed at this time.   Please call with any questions.  Joni Reining, NP  01/29/2023, 12:02 PM

## 2023-01-29 NOTE — Telephone Encounter (Signed)
Pharmacy please advise on holding Eliquis prior to cystoscopy, urethral  urethral dilation and a TURBT   scheduled for 02/09/2023. Note newly diagnosed atrial fib/flutter on 10/2/204   Has appt with Damian Leavell on 02/03/2023   Thank you.

## 2023-01-29 NOTE — Progress Notes (Signed)
OT Cancellation Note  Patient Details Name: Eddie Graham MRN: [id number] DOB: 1935-03-15   Cancelled Treatment:    Reason Eval/Treat Not Completed: OT screened, no needs identified, will sign off.   Reuben Likes, OTR/L 01/29/2023, 3:11 PM

## 2023-01-29 NOTE — Progress Notes (Signed)
Progress Note  Patient Name: Eddie Graham Date of Encounter: 01/29/2023  Primary Cardiologist:   Rollene Rotunda, MD   Subjective   He reports SOB.    Inpatient Medications    Scheduled Meds:  apixaban  2.5 mg Oral BID   diclofenac Sodium  2 g Topical QID   DULoxetine  60 mg Oral Daily   furosemide  40 mg Oral Daily   gabapentin  100 mg Oral TID   insulin aspart  0-15 Units Subcutaneous TID WC   insulin aspart  0-5 Units Subcutaneous QHS   insulin glargine-yfgn  20 Units Subcutaneous QHS   lidocaine  1 patch Transdermal Q24H   metoprolol succinate  100 mg Oral Daily   rosuvastatin  40 mg Oral Daily   Continuous Infusions:  PRN Meds: acetaminophen **OR** acetaminophen, albuterol, ondansetron **OR** ondansetron (ZOFRAN) IV, oxyCODONE, traMADol, traZODone   Vital Signs    Vitals:   01/28/23 1200 01/28/23 2100 01/29/23 0430 01/29/23 0904  BP:  92/63 118/88 137/75  Pulse:  (!) 59 63 66  Resp:  20 18   Temp:  98.7 F (37.1 C) 98 F (36.7 C) 97.9 F (36.6 C)  TempSrc:   Oral Oral  SpO2: 94% 100% 91% 97%  Weight:      Height:        Intake/Output Summary (Last 24 hours) at 01/29/2023 1016 Last data filed at 01/28/2023 1100 Gross per 24 hour  Intake 240 ml  Output --  Net 240 ml   Filed Weights   01/26/23 0929  Weight: 90.7 kg    Telemetry    Atrial flutter with controlled ventricular rate - Personally Reviewed  ECG    NA - Personally Reviewed  Physical Exam   GEN: No  acute distress.   Neck: No  JVD Cardiac: Irregular RR, no murmurs, rubs, or gallops.  Respiratory: Clear  to auscultation bilaterally. GI: Soft, nontender, non-distended, normal bowel sounds  MS:  No edema; No deformity. Neuro:   Nonfocal  Psych: Oriented and appropriate   Labs    Chemistry Recent Labs  Lab 01/26/23 1253 01/27/23 0437 01/28/23 0522 01/29/23 0453  NA 139 137 137 132*  K 4.2 3.8 4.8 4.1  CL 105 102 102 100  CO2 25 25 26 22   GLUCOSE 110*  117* 142* 132*  BUN 33* 37* 39* 40*  CREATININE 1.94* 1.91* 1.71* 2.02*  CALCIUM 8.5* 8.2* 8.0* 8.4*  PROT 6.4*  --  6.2* 6.2*  ALBUMIN 3.3*  --  3.2* 3.2*  AST 12*  --  12* 13*  ALT 13  --  13 14  ALKPHOS 62  --  56 55  BILITOT 0.9  --  1.1 1.3*  GFRNONAA 33* 34* 39* 32*  ANIONGAP 9 10 9 10      Hematology Recent Labs  Lab 01/27/23 0437 01/28/23 0522 01/29/23 0453  WBC 8.6 7.6 7.8  RBC 5.03 5.06 4.86  HGB 14.7 15.2 14.4  HCT 46.6 47.5 45.1  MCV 92.6 93.9 92.8  MCH 29.2 30.0 29.6  MCHC 31.5 32.0 31.9  RDW 15.4 15.2 15.0  PLT 204 215 206    Cardiac EnzymesNo results for input(s): "TROPONINI" in the last 168 hours. No results for input(s): "TROPIPOC" in the last 168 hours.   BNPNo results for input(s): "BNP", "PROBNP" in the last 168 hours.   DDimer No results for input(s): "DDIMER" in the last 168 hours.   Radiology    DG CHEST PORT 1 VIEW  Result Date: 01/28/2023 CLINICAL DATA:  Shortness of breath.  New onset atrial fibrillation. EXAM: PORTABLE CHEST 1 VIEW COMPARISON:  01/26/2023 FINDINGS: Poor inspiration. Mildly progressive enlargement of the cardiac silhouette. Tortuous and partially calcified thoracic aorta. Stable median sternotomy wires. Mild diffuse prominence of the interstitial markings without Kerley lines without significant change. No interval airspace consolidation, pulmonary vascular congestion or pleural fluid. Diffuse osteopenia. Cervical spine degenerative changes. IMPRESSION: 1. Mildly progressive cardiomegaly. 2. No acute abnormality. 3. Stable mild chronic interstitial lung disease. Electronically Signed   By: Beckie Salts M.D.   On: 01/28/2023 18:21    Cardiac Studies   ECHO:  1. Left ventricular ejection fraction, by estimation, is 40 to 45%. The  left ventricle has mildly decreased function. The left ventricle  demonstrates global hypokinesis. Left ventricular diastolic parameters are  indeterminate. There is severe  hypokinesis of the left  ventricular, basal-mid inferoseptal wall, inferior  wall and inferolateral wall.   2. Right ventricular systolic function is mildly reduced. The right  ventricular size is normal. There is normal pulmonary artery systolic  pressure.   3. Left atrial size was mildly dilated.   4. Right atrial size was mildly dilated.   5. The mitral valve is degenerative. Trivial mitral valve regurgitation.  No evidence of mitral stenosis.   6. The aortic valve is tricuspid. Aortic valve regurgitation is mild. No  aortic stenosis is present.   7. Aortic dilatation noted. There is dilatation of the aortic root,  measuring 38 mm. There is dilatation of the ascending aorta, measuring 40  mm.   Patient Profile     88 y.o. male with a hx of CABG in 2011, chronic diverticulitis, urethral structure, history of high-grade bladder cancer, anemia, AAA, COPD, CKD, HTN who is being seen 01/26/2023 for the evaluation of atrial flutter at the request of Dr. Arita Miss.   Assessment & Plan    New Atrial Flutter:   Plan as outlined  in the note yesterday and the day before.  Continue Eliquis.   Rate seems to be well controlled.   Tolerating metoprolol XL at current dose.     Shortness of breath :   Mildly reduced EF.  I am not sure of his baseline.  I gave him some PO diuretic yesterday.  Creat did go up.  We need to ambulate on RA and see what his sats do.  It looks like he was taking 40 mg of Lasix daily at home.  I think he will need some baseline Lasix so I would suggest 20 mg daily at discharge.  Other meds as on MAR.  Follow up has been arranged for next week for [facility] care.   CAD s/p CABG :  See above.  Stopping Plavix with Eliquis.   Continue medical management.   Pain has been in his back and left flank and reproducible with palpation.  No objective evidence of ischemia.  No further work up.     HTN:  This is being treated in the context of treating his CAD and cardiomyopathy.   Avoiding ARB/ARNi with CKD.     CKD:  Need  to follow creat closely as an out patient.  Creat was up today with resumption of the Lasix.  See above.   Follow creat in office.    For questions or updates, please contact [facility] Please consult [email or web address] for contact info under Cardiology/STEMI.   Signed, Rollene Rotunda, MD  01/29/2023, 10:16 AM

## 2023-01-29 NOTE — Progress Notes (Signed)
AVS given to patient and explained at the bedside. Medications and follow up appointments have been explained with pt verbalizing understanding.  

## 2023-01-29 NOTE — Progress Notes (Signed)
Heart Failure Navigator Progress Note  Assessed for Heart & Vascular TOC clinic readiness.  Patient EF 40-45%, has a [facility] appointment on 02/03/2023. .   Navigator available for reassessment of patient.   Rhae Hammock, BSN, Scientist, clinical (histocompatibility and immunogenetics) Only

## 2023-01-29 NOTE — Telephone Encounter (Signed)
Will update all parties involved pt has appt 02/03/23 with Azalee Course, PAC.

## 2023-01-29 NOTE — Discharge Summary (Signed)
Discharge Exam: Filed Weights   01/26/23 0929  Weight: 90.7 kg   ***  Condition at discharge: {DC Condition:26389}  The results of significant diagnostics from this hospitalization (including imaging, microbiology, ancillary and laboratory) are listed below for reference.   Imaging Studies: DG CHEST PORT 1 VIEW  Result Date: 01/28/2023 CLINICAL DATA:  Shortness of breath.  New onset atrial fibrillation. EXAM: PORTABLE CHEST 1 VIEW COMPARISON:  01/26/2023 FINDINGS: Poor inspiration. Mildly progressive enlargement of the cardiac silhouette. Tortuous and partially calcified thoracic aorta. Stable median sternotomy wires. Mild diffuse prominence of the interstitial markings without Kerley lines without significant change. No interval airspace consolidation, pulmonary vascular congestion or pleural fluid. Diffuse osteopenia. Cervical spine degenerative changes. IMPRESSION: 1. Mildly progressive cardiomegaly. 2. No acute abnormality. 3. Stable mild chronic interstitial lung disease. Electronically Signed   By: Beckie Salts M.D.   On: 01/28/2023 18:21   ECHOCARDIOGRAM COMPLETE  Result Date: 01/27/2023    ECHOCARDIOGRAM REPORT   Patient Name:   Eddie Graham Date of Exam: 01/27/2023 Medical Rec #:  [id number]                 Height:       66.0 in Accession #:    [id number]                Weight:       200.0 lb Date of Birth:  [birth year]                 BSA:          2.000 m Patient Age:    88 years                  BP:           120/68  mmHg Patient Gender: M                         HR:           117 bpm. Exam Location:  Inpatient Procedure: 2D Echo, Cardiac Doppler and Color Doppler Indications:    I48.91* Unspeicified atrial fibrillation; R07.9* Chest pain,                 unspecified  History:        Patient has no prior history of Echocardiogram examinations.                 CAD, Abnormal ECG; Risk Factors:Dyslipidemia.  Sonographer:    Sheralyn Boatman RDCS Referring Phys: [id number] MIR M [facility]  Sonographer Comments: Technically difficult study due to poor echo windows. IMPRESSIONS  1. Left ventricular ejection fraction, by estimation, is 40 to 45%. The left ventricle has mildly decreased function. The left ventricle demonstrates global hypokinesis. Left ventricular diastolic parameters are indeterminate. There is severe hypokinesis of the left ventricular, basal-mid inferoseptal wall, inferior wall and inferolateral wall.  2. Right ventricular systolic function is mildly reduced. The right ventricular size is normal. There is normal pulmonary artery systolic pressure.  3. Left atrial size was mildly dilated.  4. Right atrial size was mildly dilated.  5. The mitral valve is degenerative. Trivial mitral valve regurgitation. No evidence of mitral stenosis.  6. The aortic valve is tricuspid. Aortic valve regurgitation is mild. No aortic stenosis is present.  7. Aortic dilatation noted. 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 FINDINGS  Left Ventricle: Left  Physician Discharge Summary   Patient: Eddie Graham MRN: [id number] DOB: [birth year]  Admit date:     01/26/2023  Discharge date: 01/29/23  Discharge Physician: Marguerita Merles, DO   PCP: Andreas Blower., MD   Recommendations at discharge:  {Tip this will not be part of the note when signed- Example include specific recommendations for outpatient follow-up, pending tests to follow-up on. (Optional):26781}  ***  Discharge Diagnoses: Principal Problem:   New onset a-fib (HCC)  Resolved Problems:   * No resolved hospital problems. *  Hospital Course: HPI:  Eddie Graham is a 88 y.o. male with medical history significant for CAD, COPD on room air, hypertension, depression, type 2 diabetes insulin-dependent being admitted to the hospital with new onset atrial fibrillation RVR.  Patient has a history of prostate cancer and multiple cystoscopies with urethral dilation he was scheduled for repeat cystoscopy today due to continued microscopic hematuria.  He came today to same-day surgery, prior to the procedure was noted to be tachycardic, found to be in atrial fibrillation/flutter.  He was given a dose of IV metoprolol heart rate improved to about 132 90s.  He remains in atrial fibrillation.  He has no known prior history of this.  He has had some sort of cardiac surgery around the year 2005, but unable to tell me what the surgery was.  He is accompanied today by his godson, who is partially translating for the patient as he is not completely fluent in [place].  Patient denies any recent dizziness, chest pain, palpitations, godson states that when they go to the store over the last few months he has noticed that the patient gets a little bit more winded with ambulation.  He denies any orthopnea, or lower extremity edema.   **Interim History Cardiology to start DOAC now that surgery has been rescheduled. Will need PT/OT to Evaluate and this is pending. Had significant Right sided  Chest wall and Abdominal Pain so will trial Oxycodone and Diclofenac Gel and get CXR. Anticipate D/C in the next 24-48 hours.   Assessment and Plan:  [name] is a 88 y.o. male with medical history significant for CAD, COPD on room air, hypertension, depression, type 2 diabetes insulin-dependent being admitted to the hospital with new onset atrial fibrillation RVR.    A-fib RVR/ A Flutter This is asymptomatic, patient is hemodynamically stable.  RVR has resolved after administration of IV metoprolol 2 mg. -Inpatient admission to telemetry -Check electrolytes including magnesium -Checked TSH and was 0.858 -Obtained ECHOCARDIOGRAM and showed  -Appreciate Cardiology Consultation -Cardiology increased Metoprolol Succinate 100 mg po Daily  -Heart rates in the 100s and he does occasionally spike to 130s -Cardiology discussed with urology and the patient is undergoing rescheduling of his case and they feel that the patient is most likely to be in atrial fibrillation given that they are not in a cardiovert him and even that TEE/DCCV would delay surgery at least a month.  They feel that is most prudent to start Eliquis and plan his procedure and they are recommending stopping Eliquis if he has gross hematuria or bleeding.  They are recommending holding Eliquis for 2 days prior to the procedure and hopefully restarting soon afterwards and over next 3 weeks and we will make a decision about his rate control strategy over the control strategy with cardioversion 3 weeks after Eliquis is uninterrupted. -PT/OT Evaluation pending   Chest and Abdominal Pain -Likely Musculoskeletal -Its Right sided -Start Oxycodone 5 mg and Diclofenac Gel -CXR done  Physician Discharge Summary   Patient: Eddie Graham MRN: [id number] DOB: [birth year]  Admit date:     01/26/2023  Discharge date: 01/29/23  Discharge Physician: Marguerita Merles, DO   PCP: Andreas Blower., MD   Recommendations at discharge:  {Tip this will not be part of the note when signed- Example include specific recommendations for outpatient follow-up, pending tests to follow-up on. (Optional):26781}  ***  Discharge Diagnoses: Principal Problem:   New onset a-fib (HCC)  Resolved Problems:   * No resolved hospital problems. *  Hospital Course: HPI:  Eddie Graham is a 88 y.o. male with medical history significant for CAD, COPD on room air, hypertension, depression, type 2 diabetes insulin-dependent being admitted to the hospital with new onset atrial fibrillation RVR.  Patient has a history of prostate cancer and multiple cystoscopies with urethral dilation he was scheduled for repeat cystoscopy today due to continued microscopic hematuria.  He came today to same-day surgery, prior to the procedure was noted to be tachycardic, found to be in atrial fibrillation/flutter.  He was given a dose of IV metoprolol heart rate improved to about 132 90s.  He remains in atrial fibrillation.  He has no known prior history of this.  He has had some sort of cardiac surgery around the year 2005, but unable to tell me what the surgery was.  He is accompanied today by his godson, who is partially translating for the patient as he is not completely fluent in [place].  Patient denies any recent dizziness, chest pain, palpitations, godson states that when they go to the store over the last few months he has noticed that the patient gets a little bit more winded with ambulation.  He denies any orthopnea, or lower extremity edema.   **Interim History Cardiology to start DOAC now that surgery has been rescheduled. Will need PT/OT to Evaluate and this is pending. Had significant Right sided  Chest wall and Abdominal Pain so will trial Oxycodone and Diclofenac Gel and get CXR. Anticipate D/C in the next 24-48 hours.   Assessment and Plan:  [name] is a 88 y.o. male with medical history significant for CAD, COPD on room air, hypertension, depression, type 2 diabetes insulin-dependent being admitted to the hospital with new onset atrial fibrillation RVR.    A-fib RVR/ A Flutter This is asymptomatic, patient is hemodynamically stable.  RVR has resolved after administration of IV metoprolol 2 mg. -Inpatient admission to telemetry -Check electrolytes including magnesium -Checked TSH and was 0.858 -Obtained ECHOCARDIOGRAM and showed  -Appreciate Cardiology Consultation -Cardiology increased Metoprolol Succinate 100 mg po Daily  -Heart rates in the 100s and he does occasionally spike to 130s -Cardiology discussed with urology and the patient is undergoing rescheduling of his case and they feel that the patient is most likely to be in atrial fibrillation given that they are not in a cardiovert him and even that TEE/DCCV would delay surgery at least a month.  They feel that is most prudent to start Eliquis and plan his procedure and they are recommending stopping Eliquis if he has gross hematuria or bleeding.  They are recommending holding Eliquis for 2 days prior to the procedure and hopefully restarting soon afterwards and over next 3 weeks and we will make a decision about his rate control strategy over the control strategy with cardioversion 3 weeks after Eliquis is uninterrupted. -PT/OT Evaluation pending   Chest and Abdominal Pain -Likely Musculoskeletal -Its Right sided -Start Oxycodone 5 mg and Diclofenac Gel -CXR done  Discharge Exam: Filed Weights   01/26/23 0929  Weight: 90.7 kg   ***  Condition at discharge: {DC Condition:26389}  The results of significant diagnostics from this hospitalization (including imaging, microbiology, ancillary and laboratory) are listed below for reference.   Imaging Studies: DG CHEST PORT 1 VIEW  Result Date: 01/28/2023 CLINICAL DATA:  Shortness of breath.  New onset atrial fibrillation. EXAM: PORTABLE CHEST 1 VIEW COMPARISON:  01/26/2023 FINDINGS: Poor inspiration. Mildly progressive enlargement of the cardiac silhouette. Tortuous and partially calcified thoracic aorta. Stable median sternotomy wires. Mild diffuse prominence of the interstitial markings without Kerley lines without significant change. No interval airspace consolidation, pulmonary vascular congestion or pleural fluid. Diffuse osteopenia. Cervical spine degenerative changes. IMPRESSION: 1. Mildly progressive cardiomegaly. 2. No acute abnormality. 3. Stable mild chronic interstitial lung disease. Electronically Signed   By: Beckie Salts M.D.   On: 01/28/2023 18:21   ECHOCARDIOGRAM COMPLETE  Result Date: 01/27/2023    ECHOCARDIOGRAM REPORT   Patient Name:   Eddie Graham Date of Exam: 01/27/2023 Medical Rec #:  [id number]                 Height:       66.0 in Accession #:    [id number]                Weight:       200.0 lb Date of Birth:  [birth year]                 BSA:          2.000 m Patient Age:    88 years                  BP:           120/68  mmHg Patient Gender: M                         HR:           117 bpm. Exam Location:  Inpatient Procedure: 2D Echo, Cardiac Doppler and Color Doppler Indications:    I48.91* Unspeicified atrial fibrillation; R07.9* Chest pain,                 unspecified  History:        Patient has no prior history of Echocardiogram examinations.                 CAD, Abnormal ECG; Risk Factors:Dyslipidemia.  Sonographer:    Sheralyn Boatman RDCS Referring Phys: [id number] MIR M [facility]  Sonographer Comments: Technically difficult study due to poor echo windows. IMPRESSIONS  1. Left ventricular ejection fraction, by estimation, is 40 to 45%. The left ventricle has mildly decreased function. The left ventricle demonstrates global hypokinesis. Left ventricular diastolic parameters are indeterminate. There is severe hypokinesis of the left ventricular, basal-mid inferoseptal wall, inferior wall and inferolateral wall.  2. Right ventricular systolic function is mildly reduced. The right ventricular size is normal. There is normal pulmonary artery systolic pressure.  3. Left atrial size was mildly dilated.  4. Right atrial size was mildly dilated.  5. The mitral valve is degenerative. Trivial mitral valve regurgitation. No evidence of mitral stenosis.  6. The aortic valve is tricuspid. Aortic valve regurgitation is mild. No aortic stenosis is present.  7. Aortic dilatation noted. 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 FINDINGS  Left Ventricle: Left  Discharge Exam: Filed Weights   01/26/23 0929  Weight: 90.7 kg   ***  Condition at discharge: {DC Condition:26389}  The results of significant diagnostics from this hospitalization (including imaging, microbiology, ancillary and laboratory) are listed below for reference.   Imaging Studies: DG CHEST PORT 1 VIEW  Result Date: 01/28/2023 CLINICAL DATA:  Shortness of breath.  New onset atrial fibrillation. EXAM: PORTABLE CHEST 1 VIEW COMPARISON:  01/26/2023 FINDINGS: Poor inspiration. Mildly progressive enlargement of the cardiac silhouette. Tortuous and partially calcified thoracic aorta. Stable median sternotomy wires. Mild diffuse prominence of the interstitial markings without Kerley lines without significant change. No interval airspace consolidation, pulmonary vascular congestion or pleural fluid. Diffuse osteopenia. Cervical spine degenerative changes. IMPRESSION: 1. Mildly progressive cardiomegaly. 2. No acute abnormality. 3. Stable mild chronic interstitial lung disease. Electronically Signed   By: Beckie Salts M.D.   On: 01/28/2023 18:21   ECHOCARDIOGRAM COMPLETE  Result Date: 01/27/2023    ECHOCARDIOGRAM REPORT   Patient Name:   Eddie Graham Date of Exam: 01/27/2023 Medical Rec #:  [id number]                 Height:       66.0 in Accession #:    [id number]                Weight:       200.0 lb Date of Birth:  [birth year]                 BSA:          2.000 m Patient Age:    88 years                  BP:           120/68  mmHg Patient Gender: M                         HR:           117 bpm. Exam Location:  Inpatient Procedure: 2D Echo, Cardiac Doppler and Color Doppler Indications:    I48.91* Unspeicified atrial fibrillation; R07.9* Chest pain,                 unspecified  History:        Patient has no prior history of Echocardiogram examinations.                 CAD, Abnormal ECG; Risk Factors:Dyslipidemia.  Sonographer:    Sheralyn Boatman RDCS Referring Phys: [id number] MIR M [facility]  Sonographer Comments: Technically difficult study due to poor echo windows. IMPRESSIONS  1. Left ventricular ejection fraction, by estimation, is 40 to 45%. The left ventricle has mildly decreased function. The left ventricle demonstrates global hypokinesis. Left ventricular diastolic parameters are indeterminate. There is severe hypokinesis of the left ventricular, basal-mid inferoseptal wall, inferior wall and inferolateral wall.  2. Right ventricular systolic function is mildly reduced. The right ventricular size is normal. There is normal pulmonary artery systolic pressure.  3. Left atrial size was mildly dilated.  4. Right atrial size was mildly dilated.  5. The mitral valve is degenerative. Trivial mitral valve regurgitation. No evidence of mitral stenosis.  6. The aortic valve is tricuspid. Aortic valve regurgitation is mild. No aortic stenosis is present.  7. Aortic dilatation noted. 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 FINDINGS  Left Ventricle: Left  Discharge Exam: Filed Weights   01/26/23 0929  Weight: 90.7 kg   ***  Condition at discharge: {DC Condition:26389}  The results of significant diagnostics from this hospitalization (including imaging, microbiology, ancillary and laboratory) are listed below for reference.   Imaging Studies: DG CHEST PORT 1 VIEW  Result Date: 01/28/2023 CLINICAL DATA:  Shortness of breath.  New onset atrial fibrillation. EXAM: PORTABLE CHEST 1 VIEW COMPARISON:  01/26/2023 FINDINGS: Poor inspiration. Mildly progressive enlargement of the cardiac silhouette. Tortuous and partially calcified thoracic aorta. Stable median sternotomy wires. Mild diffuse prominence of the interstitial markings without Kerley lines without significant change. No interval airspace consolidation, pulmonary vascular congestion or pleural fluid. Diffuse osteopenia. Cervical spine degenerative changes. IMPRESSION: 1. Mildly progressive cardiomegaly. 2. No acute abnormality. 3. Stable mild chronic interstitial lung disease. Electronically Signed   By: Beckie Salts M.D.   On: 01/28/2023 18:21   ECHOCARDIOGRAM COMPLETE  Result Date: 01/27/2023    ECHOCARDIOGRAM REPORT   Patient Name:   Eddie Graham Date of Exam: 01/27/2023 Medical Rec #:  [id number]                 Height:       66.0 in Accession #:    [id number]                Weight:       200.0 lb Date of Birth:  [birth year]                 BSA:          2.000 m Patient Age:    88 years                  BP:           120/68  mmHg Patient Gender: M                         HR:           117 bpm. Exam Location:  Inpatient Procedure: 2D Echo, Cardiac Doppler and Color Doppler Indications:    I48.91* Unspeicified atrial fibrillation; R07.9* Chest pain,                 unspecified  History:        Patient has no prior history of Echocardiogram examinations.                 CAD, Abnormal ECG; Risk Factors:Dyslipidemia.  Sonographer:    Sheralyn Boatman RDCS Referring Phys: [id number] MIR M [facility]  Sonographer Comments: Technically difficult study due to poor echo windows. IMPRESSIONS  1. Left ventricular ejection fraction, by estimation, is 40 to 45%. The left ventricle has mildly decreased function. The left ventricle demonstrates global hypokinesis. Left ventricular diastolic parameters are indeterminate. There is severe hypokinesis of the left ventricular, basal-mid inferoseptal wall, inferior wall and inferolateral wall.  2. Right ventricular systolic function is mildly reduced. The right ventricular size is normal. There is normal pulmonary artery systolic pressure.  3. Left atrial size was mildly dilated.  4. Right atrial size was mildly dilated.  5. The mitral valve is degenerative. Trivial mitral valve regurgitation. No evidence of mitral stenosis.  6. The aortic valve is tricuspid. Aortic valve regurgitation is mild. No aortic stenosis is present.  7. Aortic dilatation noted. 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 FINDINGS  Left Ventricle: Left

## 2023-01-29 NOTE — Telephone Encounter (Signed)
Pre-operative Risk Assessment    Patient Name: Eddie Graham  DOB: 1934-11-10 MRN: [id number]      Request for Surgical Clearance    Procedure:   Cystoscopy, urethral dilation and a TURBT  Date of Surgery:  Clearance 02/09/23                                 Surgeon:  Dr. Ander Slade Group or Practice Name:  [facility] Phone number:  [phone]  Fax number:  [phone]   Type of Clearance Requested:   - Pharmacy:  Hold Apixaban (Eliquis)     Type of Anesthesia:  General    Additional requests/questions:   Caller Shanda Bumps) stated patient will need to hold Eliquis 2 days prior to surgery.  Patient has visit scheduled on 10/9.  Signed, Annetta Maw   01/29/2023, 10:59 AM

## 2023-01-29 NOTE — Telephone Encounter (Signed)
Name: Eddie Graham  DOB: Oct 13, 1934  MRN: [id number]  Primary Cardiologist: Rollene Rotunda, MD  Chart reviewed as part of pre-operative protocol coverage. The patient has an upcoming visit scheduled with Azalee Course, PA  on 02/03/2023 at which time clearance can be addressed in case there are any issues that would impact surgical recommendations.  Cystoscopy.urethral dilation and a TURBT Is not scheduled until 02/09/2023 as below. I added preop FYI to appointment note so that provider is aware to address at time of outpatient visit.  Per office protocol the cardiology provider should forward their finalized clearance decision and recommendations regarding antiplatelet therapy to the requesting party below.    This message will also be routed to pharmacy pool  for input on holding Eliquis as requested below so that this information is available to the clearing provider at time of patient's appointment.   I will route this message as FYI to requesting party and remove this message from the preop box as separate preop APP input not needed at this time.   Please call with any questions.  Joni Reining, NP  01/29/2023, 11:40 AM

## 2023-01-29 NOTE — Evaluation (Signed)
Physical Therapy Evaluation Only Patient Details Name: Eddie Graham MRN: [id number] DOB: 05-01-1934 Today's Date: 01/29/2023  History of Present Illness  Eddie Graham is a 88 y.o. male admitted to the hospital with new onset atrial fibrillation RVR. PMH: AAA, CKD, CAD, COPD, HTN, depression, type 2 diabetes, prostate cancer  Clinical Impression   Pt from apartment alone, ind with SPC, also has electric scooter for longer distances as needed, denies falls, has a friend who provides transportation. Pt mobilizing around room with SPC in R hand for transfers and ambulates out into hallway with SPC, modified independent. Therapist towing interpreter, O2 tank and pulse ox, pt reports back and leg pain as well as dyspnea, on RA with SpO2 reading 89-97%, though poor waveform. No acute PT needs identified and no f/u PT recommended at discharge. Will sign off at this time.      If plan is discharge home, recommend the following:     Can travel by private vehicle        Equipment Recommendations None recommended by PT  Recommendations for Other Services       Functional Status Assessment Patient has not had a recent decline in their functional status     Precautions / Restrictions Restrictions Weight Bearing Restrictions: No      Mobility  Bed Mobility               General bed mobility comments: sitting EOB upon arrival    Transfers Overall transfer level: Modified independent Equipment used: Straight cane                    Ambulation/Gait Ambulation/Gait assistance: Modified independent (Device/Increase time) Gait Distance (Feet): 280 Feet Assistive device: Straight cane Gait Pattern/deviations: WFL(Within Functional Limits) Gait velocity: WFL     General Gait Details: step through gait pattern with SPC, able to complete turns and navigate around obstacles without difficulty, reports back and leg pain, dyspnea 2/4 on RA with SpO2 89-97%  with poor waveform  Stairs            Wheelchair Mobility     Tilt Bed    Modified Rankin (Stroke Patients Only)       Balance Overall balance assessment: No apparent balance deficits (not formally assessed)                                           Pertinent Vitals/Pain Pain Assessment Pain Assessment: Faces Faces Pain Scale: Hurts little more Pain Location: back and legs Pain Descriptors / Indicators: Discomfort, Grimacing Pain Intervention(s): Limited activity within patient's tolerance, Monitored during session, Repositioned    Home Living Family/patient expects to be discharged to:: Private residence Living Arrangements: Alone Available Help at Discharge: Friend(s);Available PRN/intermittently Type of Home: Apartment Home Access: Level entry       Home Layout: One level Home Equipment: Cane - single point;Electric scooter      Prior Function Prior Level of Function : Independent/Modified Independent             Mobility Comments: pt reports ind with SPC, also has Art gallery manager for community distances; friend Para March provides transportation ADLs Comments: pt reports ind with ADLs/IADLs     Extremity/Trunk Assessment   Upper Extremity Assessment Upper Extremity Assessment: Overall WFL for tasks assessed    Lower Extremity Assessment Lower Extremity Assessment: Overall WFL for tasks assessed  Cervical / Trunk Assessment Cervical / Trunk Assessment: Normal  Communication   Communication Communication: No apparent difficulties  Cognition Arousal: Alert Behavior During Therapy: WFL for tasks assessed/performed Overall Cognitive Status: Within Functional Limits for tasks assessed                                 General Comments: pt pleasant, follows commands, utilized interpreter though pt answering some questions and directions prior to interpreted into spanish        General Comments       Exercises     Assessment/Plan    PT Assessment Patient does not need any further PT services  PT Problem List Decreased activity tolerance;Decreased balance;Pain;Decreased knowledge of precautions       PT Treatment Interventions DME instruction;Gait training;Functional mobility training;Therapeutic activities;Therapeutic exercise;Patient/family education    PT Goals (Current goals can be found in the Care Plan section)  Acute Rehab PT Goals Patient Stated Goal: return home, less pain PT Goal Formulation: All assessment and education complete, DC therapy    Frequency       Co-evaluation               AM-PAC PT "6 Clicks" Mobility  Outcome Measure Help needed turning from your back to your side while in a flat bed without using bedrails?: None Help needed moving from lying on your back to sitting on the side of a flat bed without using bedrails?: None Help needed moving to and from a bed to a chair (including a wheelchair)?: None Help needed standing up from a chair using your arms (e.g., wheelchair or bedside chair)?: None Help needed to walk in hospital room?: None Help needed climbing 3-5 steps with a railing? : A Little 6 Click Score: 23    End of Session Equipment Utilized During Treatment: Oxygen Activity Tolerance: Patient tolerated treatment well Patient left: in bed;with call bell/phone within reach;with bed alarm set (sitting EOB) Nurse Communication: Mobility status;Other (comment) (SpO2) PT Visit Diagnosis: Other abnormalities of gait and mobility (R26.89)    Time: 0865-7846 PT Time Calculation (min) (ACUTE ONLY): 24 min   Charges:   PT Evaluation $PT Eval Low Complexity: 1 Low PT Treatments $Gait Training: 8-22 mins PT General Charges $$ ACUTE PT VISIT: 1 Visit         Tori Itzell [place] PT, DPT 01/29/23, 12:20 PM

## 2023-01-29 NOTE — Telephone Encounter (Signed)
Patient with diagnosis of afib on Eliquis for anticoagulation.    Procedure: Cystoscopy, urethral dilation and a TURBT  Date of procedure: 02/09/23   CHA2DS2-VASc Score = 6   This indicates a 9.7% annual risk of stroke. The patient's score is based upon: CHF History: 1 HTN History: 1 Diabetes History: 1 Stroke History: 0 Vascular Disease History: 1 Age Score: 2 Gender Score: 0      CrCl 28 ml/min  Per office protocol, patient can hold Eliquis for 3 days prior to procedure.    **This guidance is not considered finalized until pre-operative APP has relayed final recommendations.**

## 2023-02-01 NOTE — Progress Notes (Signed)
For Short Stay: COVID SWAB appointment date:  Bowel Prep reminder:   For Anesthesia: PCP - Andreas Blower., MD  Cardiologist - Rollene Rotunda, MD  Clearance: Pending,appoinment on 02/03/23 Chest x-ray - 01/28/23 EKG - 01/29/23 Stress Test -  ECHO - 01/27/23 Cardiac Cath -  Pacemaker/ICD device last checked: Pacemaker orders received: Device Rep notified:  Spinal Cord Stimulator:  Sleep Study - N/A CPAP -   Fasting Blood Sugar -  Checks Blood Sugar _____ times a day Date and result of last Hgb A1c- 7.4: 01/20/23  Last dose of GLP1 agonist- semaglutide: Hold it after: 02/01/23 GLP1 instructions:   Last dose of SGLT-2 inhibitors-  SGLT-2 instructions:   Blood Thinner Instructions: Aspirin Instructions: Last Dose:  Activity level: Can go up a flight of stairs and activities of daily living without stopping and without chest pain and/or shortness of breath   Able to exercise without chest pain and/or shortness of breath   Unable to go up a flight of stairs without chest pain and/or shortness of breath     Anesthesia review: Hx: AAA,COPD,DIA,HTN,CHF,CKD III,CABG.  Patient denies shortness of breath, fever, cough and chest pain at PAT appointment   Patient verbalized understanding of instructions that were given to them at the PAT appointment. Patient was also instructed that they will need to review over the PAT instructions again at home before surgery.

## 2023-02-01 NOTE — Patient Instructions (Signed)
DUE TO COVID-19 ONLY TWO VISITORS  (aged 86 and older)  ARE ALLOWED TO COME WITH YOU AND STAY IN THE WAITING ROOM ONLY DURING PRE OP AND PROCEDURE.   **NO VISITORS ARE ALLOWED IN THE SHORT STAY AREA OR RECOVERY ROOM!!**  IF YOU WILL BE ADMITTED INTO THE HOSPITAL YOU ARE ALLOWED ONLY FOUR SUPPORT PEOPLE DURING VISITATION HOURS ONLY (7 AM -8PM)   The support person(s) must pass our screening, gel in and out, and wear a mask at all times, including in the patient's room. Patients must also wear a mask when staff or their support person are in the room. Visitors GUEST BADGE MUST BE WORN VISIBLY  One adult visitor may remain with you overnight and MUST be in the room by 8 P.M.     Your procedure is scheduled on: 02/09/23   Report to [facility] Main Entrance    Report to admitting at : 12:45 PM   Call this number if you have problems the morning of surgery [phone]   Do not eat food :After Midnight.   After Midnight you may have the following liquids until : 12:00 PM DAY OF SURGERY  Water Black Coffee (sugar ok, NO MILK/CREAM OR CREAMERS)  Tea (sugar ok, NO MILK/CREAM OR CREAMERS) regular and decaf                             Plain Jell-O (NO RED)                                           Fruit ices (not with fruit pulp, NO RED)                                     Popsicles (NO RED)                                                                  Juice: apple, WHITE grape, WHITE cranberry Sports drinks like Gatorade (NO RED)              FOLLOW ANY ADDITIONAL PRE OP INSTRUCTIONS YOU RECEIVED FROM YOUR SURGEON'S OFFICE!!!   Oral Hygiene is also important to reduce your risk of infection.                                    Remember - BRUSH YOUR TEETH THE MORNING OF SURGERY WITH YOUR REGULAR TOOTHPASTE  DENTURES WILL BE REMOVED PRIOR TO SURGERY PLEASE DO NOT APPLY "Poly grip" OR ADHESIVES!!!   Do NOT smoke after Midnight   Take these medicines the morning of surgery with  A SIP OF WATER: gabapentin,duloxetine,metoprolol,tamsulosin.Use inhalers as usual.Tylenol as needed.  How to Manage Your Diabetes Before and After Surgery  Why is it important to control my blood sugar before and after surgery? Improving blood sugar levels before and after surgery helps healing and can limit problems. A way of improving blood sugar control is eating a healthy diet  by:  Eating less sugar and carbohydrates  Increasing activity/exercise  Talking with your doctor about reaching your blood sugar goals High blood sugars (greater than 180 mg/dL) can raise your risk of infections and slow your recovery, so you will need to focus on controlling your diabetes during the weeks before surgery. Make sure that the doctor who takes care of your diabetes knows about your planned surgery including the date and location.  How do I manage my blood sugar before surgery? Check your blood sugar at least 4 times a day, starting 2 days before surgery, to make sure that the level is not too high or low. Check your blood sugar the morning of your surgery when you wake up and every 2 hours until you get to the Short Stay unit. If your blood sugar is less than 70 mg/dL, you will need to treat for low blood sugar: Do not take insulin. Treat a low blood sugar (less than 70 mg/dL) with  cup of clear juice (cranberry or apple), 4 glucose tablets, OR glucose gel. Recheck blood sugar in 15 minutes after treatment (to make sure it is greater than 70 mg/dL). If your blood sugar is not greater than 70 mg/dL on recheck, call [phone] for further instructions. Report your blood sugar to the short stay nurse when you get to Short Stay.  If you are admitted to the hospital after surgery: Your blood sugar will be checked by the staff and you will probably be given insulin after surgery (instead of oral diabetes medicines) to make sure you have good blood sugar levels. The goal for blood sugar control after  surgery is 80-180 mg/dL.   WHAT DO I DO ABOUT MY DIABETES MEDICATION?  THE NIGHT BEFORE SURGERY,DO NOT take glipizide. Take ONLY half of lantus insulin dose.  THE MORNING OF SURGERY, DO NOT TAKE ANY ORAL DIABETIC MEDICATIONS DAY OF YOUR SURGERY  DO NOT TAKE THE FOLLOWING 7 DAYS PRIOR TO SURGERY: Ozempic, Wegovy, Rybelsus (Semaglutide), Byetta (exenatide), Bydureon (exenatide ER), Victoza, Saxenda (liraglutide), or Trulicity (dulaglutide) Mounjaro (Tirzepatide) Adlyxin (Lixisenatide), Polyethylene Glycol Loxenatide. HOLD semaglutide: after: 02/01/23                              You may not have any metal on your body including hair pins, jewelry, and body piercing             Do not wear lotions, powders, perfumes/cologne, or deodorant              Men may shave face and neck.   Do not bring valuables to the hospital. [facility] IS NOT             RESPONSIBLE   FOR VALUABLES.   Contacts, glasses, or bridgework may not be worn into surgery.   Bring small overnight bag day of surgery.   DO NOT BRING YOUR HOME MEDICATIONS TO THE HOSPITAL. PHARMACY WILL DISPENSE MEDICATIONS LISTED ON YOUR MEDICATION LIST TO YOU DURING YOUR ADMISSION IN THE HOSPITAL!    Patients discharged on the day of surgery will not be allowed to drive home.  Someone NEEDS to stay with you for the first 24 hours after anesthesia.   Special Instructions: Bring a copy of your healthcare power of attorney and living will documents         the day of surgery if you haven't scanned them before.  Please read over the following fact sheets you were given: IF YOU HAVE QUESTIONS ABOUT YOUR PRE-OP INSTRUCTIONS PLEASE CALL [phone]    [facility] - Preparing for Surgery Before surgery, you can play an important role.  Because skin is not sterile, your skin needs to be as free of germs as possible.  You can reduce the number of germs on your skin by washing with CHG (chlorahexidine gluconate) soap before surgery.   CHG is an antiseptic cleaner which kills germs and bonds with the skin to continue killing germs even after washing. Please DO NOT use if you have an allergy to CHG or antibacterial soaps.  If your skin becomes reddened/irritated stop using the CHG and inform your nurse when you arrive at Short Stay. Do not shave (including legs and underarms) for at least 48 hours prior to the first CHG shower.  You may shave your face/neck. Please follow these instructions carefully:  1.  Shower with CHG Soap the night before surgery and the  morning of Surgery.  2.  If you choose to wash your hair, wash your hair first as usual with your  normal  shampoo.  3.  After you shampoo, rinse your hair and body thoroughly to remove the  shampoo.                           4.  Use CHG as you would any other liquid soap.  You can apply chg directly  to the skin and wash                       Gently with a scrungie or clean washcloth.  5.  Apply the CHG Soap to your body ONLY FROM THE NECK DOWN.   Do not use on face/ open                           Wound or open sores. Avoid contact with eyes, ears mouth and genitals (private parts).                       Wash face,  Genitals (private parts) with your normal soap.             6.  Wash thoroughly, paying special attention to the area where your surgery  will be performed.  7.  Thoroughly rinse your body with warm water from the neck down.  8.  DO NOT shower/wash with your normal soap after using and rinsing off  the CHG Soap.                9.  Pat yourself dry with a clean towel.            10.  Wear clean pajamas.            11.  Place clean sheets on your bed the night of your first shower and do not  sleep with pets. Day of Surgery : Do not apply any lotions/deodorants the morning of surgery.  Please wear clean clothes to the hospital/surgery center.  FAILURE TO FOLLOW THESE INSTRUCTIONS MAY RESULT IN THE CANCELLATION OF YOUR SURGERY PATIENT  SIGNATURE_________________________________  NURSE SIGNATURE__________________________________  ________________________________________________________________________

## 2023-02-02 NOTE — Telephone Encounter (Signed)
Call to patient using translation line.  [facility] #[id number].  Patient did not answer.  LM of appt date and time.  Also location of appt as well.

## 2023-02-03 ENCOUNTER — Ambulatory Visit: Payer: 59 | Attending: Physician Assistant | Admitting: Physician Assistant

## 2023-02-03 VITALS — BP 100/70 | HR 84 | Ht 66.0 in | Wt 216.2 lb

## 2023-02-03 DIAGNOSIS — Z01818 Encounter for other preprocedural examination: Secondary | ICD-10-CM

## 2023-02-03 DIAGNOSIS — R0609 Other forms of dyspnea: Secondary | ICD-10-CM

## 2023-02-03 DIAGNOSIS — I25709 Atherosclerosis of coronary artery bypass graft(s), unspecified, with unspecified angina pectoris: Secondary | ICD-10-CM | POA: Diagnosis not present

## 2023-02-03 DIAGNOSIS — Z79899 Other long term (current) drug therapy: Secondary | ICD-10-CM | POA: Diagnosis not present

## 2023-02-03 DIAGNOSIS — I4892 Unspecified atrial flutter: Secondary | ICD-10-CM

## 2023-02-03 DIAGNOSIS — E785 Hyperlipidemia, unspecified: Secondary | ICD-10-CM

## 2023-02-03 DIAGNOSIS — I10 Essential (primary) hypertension: Secondary | ICD-10-CM

## 2023-02-03 NOTE — Progress Notes (Signed)
recommend outpatient PET stress test.  ROS:   He has occasional chest discomfort, shortness of breath and fatigue.  He does not do much exercise.  He has no lower extremity edema, orthopnea or PND.  Studies Reviewed: .        Cardiac Studies & Procedures       ECHOCARDIOGRAM  ECHOCARDIOGRAM COMPLETE 01/27/2023  Narrative ECHOCARDIOGRAM REPORT    Patient Name:   Eddie Graham Date of Exam: 01/27/2023 Medical Rec #:  [id number]                 Height:       66.0 in Accession #:    [id number]                Weight:       200.0 lb Date of Birth:  10/03/1934                 BSA:          2.000 m Patient Age:    88 years                  BP:           120/68 mmHg Patient Gender: M                         HR:           117 bpm. Exam Location:  Inpatient  Procedure: 2D Echo, Cardiac Doppler and Color Doppler  Indications:    I48.91* Unspeicified atrial fibrillation; R07.9* Chest pain, unspecified  History:        Patient has no prior history of Echocardiogram examinations. CAD, Abnormal ECG; Risk Factors:Dyslipidemia.  Sonographer:    Sheralyn Boatman RDCS Referring Phys: [id number] MIR M [facility]   Sonographer Comments: Technically difficult study due to poor echo windows. IMPRESSIONS   1. Left ventricular ejection fraction, by estimation, is 40 to 45%. The left ventricle has mildly decreased function. The left ventricle demonstrates global hypokinesis. Left ventricular diastolic parameters are indeterminate. There is severe hypokinesis of the left ventricular, basal-mid inferoseptal wall, inferior wall and inferolateral wall. 2. Right ventricular systolic function is mildly reduced. The right ventricular size is normal. There is normal pulmonary artery systolic pressure. 3. Left atrial size was mildly dilated. 4. Right atrial size was mildly dilated. 5. The mitral valve is degenerative. Trivial mitral valve regurgitation. No evidence of mitral stenosis. 6. The aortic valve is tricuspid. Aortic valve regurgitation is mild. No aortic stenosis is present. 7. Aortic dilatation noted. 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  FINDINGS Left Ventricle: Left ventricular ejection fraction, by estimation, is 40 to 45%. The left ventricle has mildly decreased function. The left  ventricle demonstrates global hypokinesis. Severe hypokinesis of the left ventricular, basal-mid inferoseptal wall, inferior wall and inferolateral wall. The left ventricular internal cavity size was normal in size. There is no left ventricular hypertrophy. Left ventricular diastolic function could not be evaluated due to atrial fibrillation. Left ventricular diastolic parameters are indeterminate.   LV Wall Scoring: The inferior wall, posterior wall, mid inferoseptal segment, and basal inferoseptal segment are hypokinetic.  Right Ventricle: The right ventricular size is normal. No increase in right ventricular wall thickness. Right ventricular systolic function is mildly reduced. There is normal pulmonary artery systolic pressure. The tricuspid regurgitant velocity is 1.87 m/s, and with an assumed right  Cardiology Office Note:  .   Date:  02/03/2023  ID:  Trinda Pascal, DOB 12-04-1934, MRN [id number] PCP: Andreas Blower., MD  [facility] Providers Cardiologist:  Rollene Rotunda, MD     History of Present Illness: .   Eddie Graham is a 88 y.o. male with PMH of CAD s/p CABG 2011, chronic diverticulitis, urethral stricture, high-grade bladder cancer, anemia, AAA, COPD, CKD, hypertension and hyperlipidemia.  He has not been followed by cardiology service since he moved to [place] from [place] in 2017.  He has been followed by [facility] urology service for history of bladder cancer.    He was diagnosed with bladder cancer in 2019 and underwent TURBT at that time.  He had gross hematuria and underwent a urethral balloon dilatation and TRUBT in June 2023.  He also underwent a cystoscopy with urethral dilatation in August 2023.  He had resection of the perforated intramural sigmoid colonic abscess in January 2024 with end colostomy.  He was admitted at [facility] in February 2024 with acute GI bleed.  GI was consulted and did not feel patient required colonoscopy and a suspected bleeding was either postop bleed versus diverticulitis.  His bleeding stopped during the hospitalization.  He was seen by urology service in September to schedule surveillance cystoscopy.  He had microscopic hematuria at the time.  Urology service was unable to transversed the scope through a urethral stricture.  He was scheduled to undergo diagnostic cystoscopy with possible TURP and urethral dilatation on 10/1.    When he presented for his procedure, he was found to be tachycardic with heart rate in the 130s.  EKG showed he was in atrial flutter with variable A-V rate, underlying right bundle branch block.  He was placed on IV metoprolol which later transition to metoprolol succinate.  Echocardiogram obtained on 01/27/2023 showed EF 40 to 45%, global hypokinesis, severe hypokinesis of the  LV, basal mid inferoseptal wall, inferior wall, and inferior lateral wall, mildly reduced RV systolic function, mild biatrial enlargement, mild dilated aortic root measuring at 38 mm.  Patient was had some chest tightness during the hospitalization, however it was worse with deep inspiration and reproducible with palpation on exam.  He was started on Eliquis.  He was not placed on ARB or ARNI due to CKD.  Patient is pending cystoscopy with urethral dilatation and surgery on 02/09/2023.  Patient presents today for posthospital follow-up and preop clearance.  EKG shows he remained in atrial flutter with heart rate of 84 bpm.  He continued to have intermittent chest pain like the 1 he had during the recent hospitalization, however this is better after medication.  He is a poor historian even with a Nurse, learning disability.  He is not sure if his chest discomfort is similar to the previous anginal symptom or not.  During the recent hospitalization, it was felt his chest pain was atypical.  He has upcoming procedure is low risk, however given his prior cardiac history, he would be a moderate to a high risk patient going for low risk procedure.  He is at acceptable risk to proceed from the cardiac perspective.  He will need to hold Eliquis for 2 days prior to the procedure and restart as soon as possible afterward at the surgeon's discretion.  I plan to see the patient back after his procedure, if he is compliant with Eliquis, we may consider the timing of cardioversion.  If his chest pain still persist after the cardioversion, I would highly  recommend outpatient PET stress test.  ROS:   He has occasional chest discomfort, shortness of breath and fatigue.  He does not do much exercise.  He has no lower extremity edema, orthopnea or PND.  Studies Reviewed: .        Cardiac Studies & Procedures       ECHOCARDIOGRAM  ECHOCARDIOGRAM COMPLETE 01/27/2023  Narrative ECHOCARDIOGRAM REPORT    Patient Name:   Eddie Graham Date of Exam: 01/27/2023 Medical Rec #:  [id number]                 Height:       66.0 in Accession #:    [id number]                Weight:       200.0 lb Date of Birth:  10/03/1934                 BSA:          2.000 m Patient Age:    88 years                  BP:           120/68 mmHg Patient Gender: M                         HR:           117 bpm. Exam Location:  Inpatient  Procedure: 2D Echo, Cardiac Doppler and Color Doppler  Indications:    I48.91* Unspeicified atrial fibrillation; R07.9* Chest pain, unspecified  History:        Patient has no prior history of Echocardiogram examinations. CAD, Abnormal ECG; Risk Factors:Dyslipidemia.  Sonographer:    Sheralyn Boatman RDCS Referring Phys: [id number] MIR M [facility]   Sonographer Comments: Technically difficult study due to poor echo windows. IMPRESSIONS   1. Left ventricular ejection fraction, by estimation, is 40 to 45%. The left ventricle has mildly decreased function. The left ventricle demonstrates global hypokinesis. Left ventricular diastolic parameters are indeterminate. There is severe hypokinesis of the left ventricular, basal-mid inferoseptal wall, inferior wall and inferolateral wall. 2. Right ventricular systolic function is mildly reduced. The right ventricular size is normal. There is normal pulmonary artery systolic pressure. 3. Left atrial size was mildly dilated. 4. Right atrial size was mildly dilated. 5. The mitral valve is degenerative. Trivial mitral valve regurgitation. No evidence of mitral stenosis. 6. The aortic valve is tricuspid. Aortic valve regurgitation is mild. No aortic stenosis is present. 7. Aortic dilatation noted. 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  FINDINGS Left Ventricle: Left ventricular ejection fraction, by estimation, is 40 to 45%. The left ventricle has mildly decreased function. The left  ventricle demonstrates global hypokinesis. Severe hypokinesis of the left ventricular, basal-mid inferoseptal wall, inferior wall and inferolateral wall. The left ventricular internal cavity size was normal in size. There is no left ventricular hypertrophy. Left ventricular diastolic function could not be evaluated due to atrial fibrillation. Left ventricular diastolic parameters are indeterminate.   LV Wall Scoring: The inferior wall, posterior wall, mid inferoseptal segment, and basal inferoseptal segment are hypokinetic.  Right Ventricle: The right ventricular size is normal. No increase in right ventricular wall thickness. Right ventricular systolic function is mildly reduced. There is normal pulmonary artery systolic pressure. The tricuspid regurgitant velocity is 1.87 m/s, and with an assumed right  recommend outpatient PET stress test.  ROS:   He has occasional chest discomfort, shortness of breath and fatigue.  He does not do much exercise.  He has no lower extremity edema, orthopnea or PND.  Studies Reviewed: .        Cardiac Studies & Procedures       ECHOCARDIOGRAM  ECHOCARDIOGRAM COMPLETE 01/27/2023  Narrative ECHOCARDIOGRAM REPORT    Patient Name:   Eddie Graham Date of Exam: 01/27/2023 Medical Rec #:  [id number]                 Height:       66.0 in Accession #:    [id number]                Weight:       200.0 lb Date of Birth:  10/03/1934                 BSA:          2.000 m Patient Age:    88 years                  BP:           120/68 mmHg Patient Gender: M                         HR:           117 bpm. Exam Location:  Inpatient  Procedure: 2D Echo, Cardiac Doppler and Color Doppler  Indications:    I48.91* Unspeicified atrial fibrillation; R07.9* Chest pain, unspecified  History:        Patient has no prior history of Echocardiogram examinations. CAD, Abnormal ECG; Risk Factors:Dyslipidemia.  Sonographer:    Sheralyn Boatman RDCS Referring Phys: [id number] MIR M [facility]   Sonographer Comments: Technically difficult study due to poor echo windows. IMPRESSIONS   1. Left ventricular ejection fraction, by estimation, is 40 to 45%. The left ventricle has mildly decreased function. The left ventricle demonstrates global hypokinesis. Left ventricular diastolic parameters are indeterminate. There is severe hypokinesis of the left ventricular, basal-mid inferoseptal wall, inferior wall and inferolateral wall. 2. Right ventricular systolic function is mildly reduced. The right ventricular size is normal. There is normal pulmonary artery systolic pressure. 3. Left atrial size was mildly dilated. 4. Right atrial size was mildly dilated. 5. The mitral valve is degenerative. Trivial mitral valve regurgitation. No evidence of mitral stenosis. 6. The aortic valve is tricuspid. Aortic valve regurgitation is mild. No aortic stenosis is present. 7. Aortic dilatation noted. 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  FINDINGS Left Ventricle: Left ventricular ejection fraction, by estimation, is 40 to 45%. The left ventricle has mildly decreased function. The left  ventricle demonstrates global hypokinesis. Severe hypokinesis of the left ventricular, basal-mid inferoseptal wall, inferior wall and inferolateral wall. The left ventricular internal cavity size was normal in size. There is no left ventricular hypertrophy. Left ventricular diastolic function could not be evaluated due to atrial fibrillation. Left ventricular diastolic parameters are indeterminate.   LV Wall Scoring: The inferior wall, posterior wall, mid inferoseptal segment, and basal inferoseptal segment are hypokinetic.  Right Ventricle: The right ventricular size is normal. No increase in right ventricular wall thickness. Right ventricular systolic function is mildly reduced. There is normal pulmonary artery systolic pressure. The tricuspid regurgitant velocity is 1.87 m/s, and with an assumed right

## 2023-02-03 NOTE — Patient Instructions (Signed)
Medication Instructions:  HOLD ELIQUIS FOR 2 DAYS PRIOR TO PROCEDURE AND RESUME AS SOON AS CLEARED BY SURGEON.  *If you need a refill on your cardiac medications before your next appointment, please call your pharmacy*   Lab Work: CBC AND BMP TODAY If you have labs (blood work) drawn today and your tests are completely normal, you will receive your results only by: MyChart Message (if you have MyChart) OR A paper copy in the mail If you have any lab test that is abnormal or we need to change your treatment, we will call you to review the results.   Testing/Procedures: NO TESTING   Follow-Up: At [facility], you and your health needs are our priority.  As part of our continuing mission to provide you with exceptional heart care, we have created designated Provider Care Teams.  These Care Teams include your primary Cardiologist (physician) and Advanced Practice Providers (APPs -  Physician Assistants and Nurse Practitioners) who all work together to provide you with the care you need, when you need it.  We recommend signing up for the patient portal called "MyChart".  Sign up information is provided on this After Visit Summary.  MyChart is used to connect with patients for Virtual Visits (Telemedicine).  Patients are able to view lab/test results, encounter notes, upcoming appointments, etc.  Non-urgent messages can be sent to your provider as well.   To learn more about what you can do with MyChart, go to [email or web address].    Your next appointment:   3 week(s)  Provider:   Azalee Course, PA

## 2023-02-04 LAB — BASIC METABOLIC PANEL
BUN/Creatinine Ratio: 20 (ref 10–24)
BUN: 38 mg/dL — ABNORMAL HIGH (ref 8–27)
CO2: 20 mmol/L (ref 20–29)
Calcium: 9.1 mg/dL (ref 8.6–10.2)
Chloride: 100 mmol/L (ref 96–106)
Creatinine, Ser: 1.9 mg/dL — ABNORMAL HIGH (ref 0.76–1.27)
Glucose: 71 mg/dL (ref 70–99)
Potassium: 4.7 mmol/L (ref 3.5–5.2)
Sodium: 136 mmol/L (ref 134–144)
eGFR: 34 mL/min/{1.73_m2} — ABNORMAL LOW (ref >59)

## 2023-02-04 LAB — CBC
Hematocrit: 44.5 % (ref 37.5–51.0)
Hemoglobin: 14.2 g/dL (ref 13.0–17.7)
MCH: 29.5 pg (ref 26.6–33.0)
MCHC: 31.9 g/dL (ref 31.5–35.7)
MCV: 93 fL (ref 79–97)
Platelets: 211 10*3/uL (ref 150–450)
RBC: 4.81 x10E6/uL (ref 4.14–5.80)
RDW: 14.6 % (ref 11.6–15.4)
WBC: 10.3 10*3/uL (ref 3.4–10.8)

## 2023-02-05 NOTE — Telephone Encounter (Signed)
Patient was seen in the office on 10/9 for preop clearance. I have forwarded my note to [facility] urology

## 2023-02-08 ENCOUNTER — Encounter (HOSPITAL_COMMUNITY): Payer: Self-pay | Admitting: Urology

## 2023-02-08 NOTE — Pre-Procedure Instructions (Addendum)
Instructions were reviewed over the phone with Mr. Deno Lunger (Pt's friend). Also those instructions were email to Mr. Jesusita Oka email. Eliquis was hold since 02/06/23.

## 2023-02-08 NOTE — Progress Notes (Signed)
Anesthesia Chart Review   Case: [id number] Date/Time: 02/09/23 1431   Procedures:      CYSTOSCOPY WITH URETHRAL DILATATION     POSSIBLE TRANSURETHRAL RESECTION OF BLADDER TUMOR (TURBT)   Anesthesia type: General   Pre-op diagnosis: URETHRAL STRICTURE, GROSS HEMATURIA   Location: [facility] PROCEDURE [facility] / [facility]   Surgeons: Noel Christmas, MD       DISCUSSION:88 y.o. never smoker with h/o HTN, COPD, AAA, CAD s/p CABG, persistent atrial flutter, DM II, CKD, urethral stricture, gross hematuria scheduled for above procedure 02/09/2023 with Dr. Kasandra Knudsen.   Pt last seen by cardiology 02/03/2023. Per OV note, "He is at acceptable risk to proceed with upcoming procedure.  Given prior history, he is a moderate to high risk patient going for low risk procedure.  The risk is not prohibitive. -Hold Eliquis starting 02/07/2023 (two days prior to procedure), he will need to restart Eliquis as soon as possible afterward his procedure at the surgeon's discretion"  Cardioversion to be done after urological procedure.   VS: There were no vitals taken for this visit.  PROVIDERS: Andreas Blower., MD is PCP   Cardiologist - Rollene Rotunda, MD  LABS: Labs reviewed: Acceptable for surgery. (all labs ordered are listed, but only abnormal results are displayed)  Labs Reviewed - No data to display   IMAGES:   EKG:   CV: Echo 01/27/23 1. Left ventricular ejection fraction, by estimation, is 40 to 45%. The  left ventricle has mildly decreased function. The left ventricle  demonstrates global hypokinesis. Left ventricular diastolic parameters are  indeterminate. There is severe  hypokinesis of the left ventricular, basal-mid inferoseptal wall, inferior  wall and inferolateral wall.   2. Right ventricular systolic function is mildly reduced. The right  ventricular size is normal. There is normal pulmonary artery systolic  pressure.   3. Left atrial size was mildly dilated.   4. Right atrial  size was mildly dilated.   5. The mitral valve is degenerative. Trivial mitral valve regurgitation.  No evidence of mitral stenosis.   6. The aortic valve is tricuspid. Aortic valve regurgitation is mild. No  aortic stenosis is present.   7. Aortic dilatation noted. There is dilatation of the aortic root,  measuring 38 mm. There is dilatation of the ascending aorta, measuring 40  mm.  Past Medical History:  Diagnosis Date   AAA (abdominal aortic aneurysm) (HCC)    Anemia    Anxiety    Arthritis    Asthma    Back pain    Cancer (HCC)    Chronic kidney disease    COPD (chronic obstructive pulmonary disease) (HCC)    Depression    Diabetes mellitus without complication (HCC)    Dyspnea    Hypertension    Insomnia     Past Surgical History:  Procedure Laterality Date   BOWEL RESECTION     CARDIAC SURGERY     CYSTOSCOPY WITH URETHRAL DILATATION N/A 09/30/2021   Procedure: CYSTOSCOPY WITH  URETHRAL BALLOON  DILATATION;  Surgeon: Noel Christmas, MD;  Location: [facility];  Service: Urology;  Laterality: N/A;   CYSTOSCOPY WITH URETHRAL DILATATION N/A 12/09/2021   Procedure: CYSTOSCOPY WITH URETHRAL DILATATION;  Surgeon: Noel Christmas, MD;  Location: [facility];  Service: Urology;  Laterality: N/A;  1 HR   PROSTATE SURGERY     TRANSURETHRAL RESECTION OF BLADDER TUMOR N/A 09/30/2021   Procedure: TRANSURETHRAL RESECTION OF BLADDER TUMOR (TURBT);  Surgeon: Noel Christmas, MD;  Location: [facility];  Service: Urology;  Laterality: N/A;  90 MINS    MEDICATIONS: No current facility-administered medications for this encounter.    acetaminophen (TYLENOL) 650 MG CR tablet   albuterol (VENTOLIN HFA) 108 (90 Base) MCG/ACT inhaler   ANORO ELLIPTA 62.5-25 MCG/ACT AEPB   apixaban (ELIQUIS) 2.5 MG TABS tablet   Carboxymethylcellulose Sodium (EYE DROPS OP)   diclofenac Sodium (VOLTAREN) 1 % GEL   DULoxetine (CYMBALTA) 60 MG capsule   ferrous sulfate 325 (65 FE) MG tablet   furosemide (LASIX)  20 MG tablet   gabapentin (NEURONTIN) 100 MG capsule   glipiZIDE (GLUCOTROL) 10 MG tablet   Insulin Pen Needle (TECHLITE PEN NEEDLES) 32G X 4 MM MISC   LANTUS SOLOSTAR 100 UNIT/ML Solostar Pen   lidocaine (LIDODERM) 5 %   Melatonin 5 MG CAPS   metoprolol succinate (TOPROL-XL) 100 MG 24 hr tablet   montelukast (SINGULAIR) 10 MG tablet   ondansetron (ZOFRAN) 4 MG tablet   pantoprazole (PROTONIX) 40 MG tablet   pioglitazone (ACTOS) 30 MG tablet   polyethylene glycol (MIRALAX / GLYCOLAX) 17 g packet   rosuvastatin (CRESTOR) 40 MG tablet   Semaglutide 14 MG TABS   senna (SENOKOT) 8.6 MG TABS tablet   tamsulosin (FLOMAX) 0.4 MG CAPS capsule   traMADol (ULTRAM) 50 MG tablet   traZODone (DESYREL) 50 MG tablet     [facility], PA-C [facility] Pre-Surgical Testing [phone]

## 2023-02-09 ENCOUNTER — Ambulatory Visit (HOSPITAL_COMMUNITY): Payer: 59 | Admitting: Physician Assistant

## 2023-02-09 ENCOUNTER — Encounter (HOSPITAL_COMMUNITY)
Admission: RE | Disposition: A | Discharge status: Home / Self Care | Payer: Self-pay | Source: Ambulatory Visit | Attending: Urology

## 2023-02-09 ENCOUNTER — Ambulatory Visit (HOSPITAL_COMMUNITY)
Admission: RE | Admit: 2023-02-09 | Discharge: 2023-02-09 | Disposition: A | Discharge status: Home / Self Care | Payer: 59 | Source: Ambulatory Visit | Attending: Urology | Admitting: Urology

## 2023-02-09 ENCOUNTER — Other Ambulatory Visit: Payer: Self-pay

## 2023-02-09 ENCOUNTER — Encounter (HOSPITAL_COMMUNITY): Payer: Self-pay | Admitting: Urology

## 2023-02-09 ENCOUNTER — Ambulatory Visit (HOSPITAL_BASED_OUTPATIENT_CLINIC_OR_DEPARTMENT_OTHER): Payer: 59 | Admitting: Physician Assistant

## 2023-02-09 ENCOUNTER — Ambulatory Visit (HOSPITAL_COMMUNITY): Payer: 59

## 2023-02-09 DIAGNOSIS — E1122 Type 2 diabetes mellitus with diabetic chronic kidney disease: Secondary | ICD-10-CM | POA: Insufficient documentation

## 2023-02-09 DIAGNOSIS — I129 Hypertensive chronic kidney disease with stage 1 through stage 4 chronic kidney disease, or unspecified chronic kidney disease: Secondary | ICD-10-CM | POA: Insufficient documentation

## 2023-02-09 DIAGNOSIS — J449 Chronic obstructive pulmonary disease, unspecified: Secondary | ICD-10-CM | POA: Insufficient documentation

## 2023-02-09 DIAGNOSIS — N35912 Unspecified bulbous urethral stricture, male: Secondary | ICD-10-CM | POA: Diagnosis present

## 2023-02-09 DIAGNOSIS — C679 Malignant neoplasm of bladder, unspecified: Secondary | ICD-10-CM | POA: Diagnosis not present

## 2023-02-09 DIAGNOSIS — N189 Chronic kidney disease, unspecified: Secondary | ICD-10-CM | POA: Insufficient documentation

## 2023-02-09 DIAGNOSIS — D494 Neoplasm of unspecified behavior of bladder: Secondary | ICD-10-CM | POA: Diagnosis not present

## 2023-02-09 DIAGNOSIS — R310 Gross hematuria: Secondary | ICD-10-CM | POA: Insufficient documentation

## 2023-02-09 DIAGNOSIS — I2510 Atherosclerotic heart disease of native coronary artery without angina pectoris: Secondary | ICD-10-CM

## 2023-02-09 DIAGNOSIS — Z794 Long term (current) use of insulin: Secondary | ICD-10-CM | POA: Insufficient documentation

## 2023-02-09 DIAGNOSIS — Z7984 Long term (current) use of oral hypoglycemic drugs: Secondary | ICD-10-CM | POA: Diagnosis not present

## 2023-02-09 DIAGNOSIS — Z7901 Long term (current) use of anticoagulants: Secondary | ICD-10-CM | POA: Diagnosis not present

## 2023-02-09 DIAGNOSIS — K219 Gastro-esophageal reflux disease without esophagitis: Secondary | ICD-10-CM | POA: Diagnosis not present

## 2023-02-09 DIAGNOSIS — I4892 Unspecified atrial flutter: Secondary | ICD-10-CM | POA: Diagnosis not present

## 2023-02-09 DIAGNOSIS — Z951 Presence of aortocoronary bypass graft: Secondary | ICD-10-CM | POA: Diagnosis not present

## 2023-02-09 HISTORY — PX: CYSTOSCOPY WITH URETHRAL DILATATION: SHX5125

## 2023-02-09 HISTORY — PX: TRANSURETHRAL RESECTION OF BLADDER TUMOR: SHX2575

## 2023-02-09 LAB — GLUCOSE, CAPILLARY
Glucose-Capillary: 98 mg/dL (ref 70–99)
Glucose-Capillary: 113 mg/dL — ABNORMAL HIGH (ref 70–99)

## 2023-02-09 SURGERY — CYSTOSCOPY, WITH URETHRAL DILATION
Anesthesia: General

## 2023-02-09 MED ORDER — LACTATED RINGERS IV SOLN: INTRAVENOUS | Status: DC

## 2023-02-09 MED ORDER — LIDOCAINE HCL (PF) 2 % IJ SOLN
INTRAMUSCULAR | Status: AC
  Filled 2023-02-09: qty 5

## 2023-02-09 MED ORDER — FENTANYL CITRATE PF 50 MCG/ML IJ SOSY
PREFILLED_SYRINGE | INTRAMUSCULAR | Status: AC
  Filled 2023-02-09: qty 1

## 2023-02-09 MED ORDER — ESMOLOL HCL 100 MG/10ML IV SOLN
INTRAVENOUS | Status: DC | PRN
Start: 2023-02-09 — End: 2023-02-09
  Administered 2023-02-09: 15 mg via INTRAVENOUS
  Administered 2023-02-09: 5 mg via INTRAVENOUS
  Administered 2023-02-09: 10 mg via INTRAVENOUS

## 2023-02-09 MED ORDER — STERILE WATER FOR IRRIGATION IR SOLN
Status: DC | PRN
  Administered 2023-02-09: 500 mL

## 2023-02-09 MED ORDER — SODIUM CHLORIDE 0.9 % IR SOLN
Status: DC | PRN
  Administered 2023-02-09: 36000 mL

## 2023-02-09 MED ORDER — ALBUTEROL SULFATE (2.5 MG/3ML) 0.083% IN NEBU
INHALATION_SOLUTION | RESPIRATORY_TRACT | Status: AC
  Administered 2023-02-09: 2.5 mg via RESPIRATORY_TRACT
  Filled 2023-02-09: qty 3

## 2023-02-09 MED ORDER — ACETAMINOPHEN 10 MG/ML IV SOLN
INTRAVENOUS | Status: AC
  Filled 2023-02-09: qty 100

## 2023-02-09 MED ORDER — ACETAMINOPHEN 160 MG/5ML PO SOLN: 325.0000 mg | ORAL | Status: DC | PRN

## 2023-02-09 MED ORDER — DEXAMETHASONE SODIUM PHOSPHATE 10 MG/ML IJ SOLN
INTRAMUSCULAR | Status: AC
  Filled 2023-02-09: qty 1

## 2023-02-09 MED ORDER — LIDOCAINE HCL (CARDIAC) PF 100 MG/5ML IV SOSY
PREFILLED_SYRINGE | INTRAVENOUS | Status: DC | PRN
  Administered 2023-02-09: 50 mg via INTRAVENOUS

## 2023-02-09 MED ORDER — ONDANSETRON HCL 4 MG/2ML IJ SOLN
INTRAMUSCULAR | Status: AC
  Filled 2023-02-09: qty 2

## 2023-02-09 MED ORDER — ACETAMINOPHEN 10 MG/ML IV SOLN
1000.0000 mg | Freq: Once | INTRAVENOUS | Status: DC | PRN
  Administered 2023-02-09: 1000 mg via INTRAVENOUS

## 2023-02-09 MED ORDER — ROCURONIUM BROMIDE 100 MG/10ML IV SOLN
INTRAVENOUS | Status: DC | PRN
Start: 2023-02-09 — End: 2023-02-09
  Administered 2023-02-09: 80 mg via INTRAVENOUS

## 2023-02-09 MED ORDER — IOHEXOL 300 MG/ML  SOLN
INTRAMUSCULAR | Status: DC | PRN
  Administered 2023-02-09: 10 mL

## 2023-02-09 MED ORDER — ALBUTEROL SULFATE (2.5 MG/3ML) 0.083% IN NEBU: 2.5000 mg | INHALATION_SOLUTION | Freq: Once | RESPIRATORY_TRACT | Status: AC

## 2023-02-09 MED ORDER — PROPOFOL 10 MG/ML IV BOLUS
INTRAVENOUS | Status: DC | PRN
  Administered 2023-02-09: 50 mg via INTRAVENOUS

## 2023-02-09 MED ORDER — ONDANSETRON HCL 4 MG/2ML IJ SOLN
INTRAMUSCULAR | Status: DC | PRN
  Administered 2023-02-09: 4 mg via INTRAVENOUS

## 2023-02-09 MED ORDER — FENTANYL CITRATE (PF) 100 MCG/2ML IJ SOLN
INTRAMUSCULAR | Status: AC
  Filled 2023-02-09: qty 2

## 2023-02-09 MED ORDER — ALBUTEROL SULFATE HFA 108 (90 BASE) MCG/ACT IN AERS
INHALATION_SPRAY | RESPIRATORY_TRACT | Status: AC
  Filled 2023-02-09: qty 6.7

## 2023-02-09 MED ORDER — PROPOFOL 10 MG/ML IV BOLUS
INTRAVENOUS | Status: AC
  Filled 2023-02-09: qty 20

## 2023-02-09 MED ORDER — CEFAZOLIN SODIUM-DEXTROSE 2-4 GM/100ML-% IV SOLN
2.0000 g | INTRAVENOUS | Status: AC
  Administered 2023-02-09: 2 g via INTRAVENOUS
  Filled 2023-02-09: qty 100

## 2023-02-09 MED ORDER — TRAMADOL HCL 50 MG PO TABS
50.0000 mg | ORAL_TABLET | Freq: Four times a day (QID) | ORAL | 0 refills | Status: DC | PRN
Start: 2023-02-09 — End: 2023-03-01

## 2023-02-09 MED ORDER — IPRATROPIUM-ALBUTEROL 0.5-2.5 (3) MG/3ML IN SOLN
RESPIRATORY_TRACT | Status: AC
  Filled 2023-02-09: qty 3

## 2023-02-09 MED ORDER — IPRATROPIUM-ALBUTEROL 0.5-2.5 (3) MG/3ML IN SOLN
3.0000 mL | Freq: Once | RESPIRATORY_TRACT | Status: AC
  Administered 2023-02-09: 3 mL via RESPIRATORY_TRACT

## 2023-02-09 MED ORDER — FENTANYL CITRATE (PF) 100 MCG/2ML IJ SOLN
INTRAMUSCULAR | Status: DC | PRN
  Administered 2023-02-09: 12.5 ug via INTRAVENOUS
  Administered 2023-02-09 (×3): 25 ug via INTRAVENOUS
  Administered 2023-02-09: 12.5 ug via INTRAVENOUS

## 2023-02-09 MED ORDER — ORAL CARE MOUTH RINSE: 15.0000 mL | Freq: Once | OROMUCOSAL | Status: AC

## 2023-02-09 MED ORDER — DROPERIDOL 2.5 MG/ML IJ SOLN: 0.6250 mg | Freq: Once | INTRAMUSCULAR | Status: DC | PRN

## 2023-02-09 MED ORDER — OXYCODONE HCL 5 MG PO TABS: 5.0000 mg | ORAL_TABLET | Freq: Once | ORAL | Status: DC | PRN

## 2023-02-09 MED ORDER — ACETAMINOPHEN 325 MG PO TABS: 325.0000 mg | ORAL_TABLET | ORAL | Status: DC | PRN

## 2023-02-09 MED ORDER — DEXMEDETOMIDINE HCL IN NACL 200 MCG/50ML IV SOLN
INTRAVENOUS | Status: DC | PRN
Start: 2023-02-09 — End: 2023-02-09
  Administered 2023-02-09: 12 ug via INTRAVENOUS
  Administered 2023-02-09 (×2): 4 ug via INTRAVENOUS

## 2023-02-09 MED ORDER — SUGAMMADEX SODIUM 200 MG/2ML IV SOLN
INTRAVENOUS | Status: DC | PRN
Start: 2023-02-09 — End: 2023-02-09
  Administered 2023-02-09 (×2): 200 mg via INTRAVENOUS

## 2023-02-09 MED ORDER — OXYCODONE HCL 5 MG/5ML PO SOLN: 5.0000 mg | Freq: Once | ORAL | Status: DC | PRN

## 2023-02-09 MED ORDER — ALBUTEROL SULFATE HFA 108 (90 BASE) MCG/ACT IN AERS
INHALATION_SPRAY | RESPIRATORY_TRACT | Status: DC | PRN
Start: 2023-02-09 — End: 2023-02-09
  Administered 2023-02-09: 6 via RESPIRATORY_TRACT

## 2023-02-09 MED ORDER — METOPROLOL TARTRATE 5 MG/5ML IV SOLN
INTRAVENOUS | Status: DC | PRN
Start: 2023-02-09 — End: 2023-02-09
  Administered 2023-02-09: 5 mg via INTRAVENOUS

## 2023-02-09 MED ORDER — FENTANYL CITRATE PF 50 MCG/ML IJ SOSY
25.0000 ug | PREFILLED_SYRINGE | INTRAMUSCULAR | Status: DC | PRN
  Administered 2023-02-09: 50 ug via INTRAVENOUS

## 2023-02-09 MED ORDER — LACTATED RINGERS IV SOLN: INTRAVENOUS | Status: DC | PRN

## 2023-02-09 MED ORDER — DEXAMETHASONE SODIUM PHOSPHATE 4 MG/ML IJ SOLN
INTRAMUSCULAR | Status: DC | PRN
Start: 2023-02-09 — End: 2023-02-09
  Administered 2023-02-09: 5 mg via INTRAVENOUS

## 2023-02-09 MED ORDER — METOPROLOL TARTRATE 5 MG/5ML IV SOLN
INTRAVENOUS | Status: AC
  Filled 2023-02-09: qty 5

## 2023-02-09 MED ORDER — CHLORHEXIDINE GLUCONATE 0.12 % MT SOLN
15.0000 mL | Freq: Once | OROMUCOSAL | Status: AC
  Administered 2023-02-09: 15 mL via OROMUCOSAL

## 2023-02-09 SURGICAL SUPPLY — 26 items
BAG DRN RND TRDRP ANRFLXCHMBR (UROLOGICAL SUPPLIES) ×1
BAG URINE DRAIN 2000ML AR STRL (UROLOGICAL SUPPLIES) ×1 IMPLANT
BAG URO CATCHER STRL LF (MISCELLANEOUS) ×1 IMPLANT
BALLN NEPHROSTOMY (BALLOONS) ×1
BALLOON NEPHROSTOMY (BALLOONS) IMPLANT
CATH FOLEY 2W COUNCIL 20FR 5CC (CATHETERS) IMPLANT
CATH ROBINSON RED A/P 14FR (CATHETERS) IMPLANT
CATH URET 5FR 70CM CONE TIP (BALLOONS) IMPLANT
CATH URETL OPEN END 6FR 70 (CATHETERS) IMPLANT
CLOTH BEACON ORANGE TIMEOUT ST (SAFETY) ×1 IMPLANT
DRAPE FOOT SWITCH (DRAPES) ×1 IMPLANT
ELECT REM PT RETURN 15FT ADLT (MISCELLANEOUS) IMPLANT
GLOVE BIO SURGEON STRL SZ 6.5 (GLOVE) ×1 IMPLANT
GOWN STRL REUS W/ TWL LRG LVL3 (GOWN DISPOSABLE) ×1 IMPLANT
GOWN STRL REUS W/TWL LRG LVL3 (GOWN DISPOSABLE) ×1
GUIDEWIRE ANG ZIPWIRE 038X150 (WIRE) IMPLANT
GUIDEWIRE STR DUAL SENSOR (WIRE) IMPLANT
KIT TURNOVER KIT A (KITS) IMPLANT
LOOP CUT BIPOLAR 24F LRG (ELECTROSURGICAL) IMPLANT
MANIFOLD NEPTUNE II (INSTRUMENTS) ×1 IMPLANT
PACK CYSTO (CUSTOM PROCEDURE TRAY) ×1 IMPLANT
PAD PREP 24X48 CUFFED NSTRL (MISCELLANEOUS) ×1 IMPLANT
SYR TOOMEY IRRIG 70ML (MISCELLANEOUS) ×1
SYRINGE TOOMEY IRRIG 70ML (MISCELLANEOUS) IMPLANT
TUBING CONNECTING 10 (TUBING) ×1 IMPLANT
TUBING UROLOGY SET (TUBING) ×1 IMPLANT

## 2023-02-09 NOTE — Discharge Instructions (Addendum)
Transurethral Resection of Bladder Tumor (TURBT)   Definition:  Transurethral Resection of the Bladder Tumor is a surgical procedure used to diagnose and remove tumors within the bladder. TURBT is the most common treatment for early stage bladder cancer.  General instructions:     Your recent bladder surgery requires very little post hospital care but some definite precautions.  Despite the fact that no skin incisions were used, the area around the bladder incisions are raw and covered with scabs to promote healing and prevent bleeding. Certain precautions are needed to insure that the scabs are not disturbed over the next 2-4 weeks while the healing proceeds.  Because the raw surface inside your bladder and the irritating effects of urine you may expect frequency of urination and/or urgency (a stronger desire to urinate) and perhaps even getting up at night more often. This will usually resolve or improve slowly over the healing period. You may see some blood in your urine over the first 6 weeks. Do not be alarmed, even if the urine was clear for a while. Get off your feet and drink lots of fluids until clearing occurs. If you start to pass clots or don't improve call us.  Catheter: (If you are discharged with a catheter.)  1. Keep your catheter secured to your leg at all times with tape or the supplied strap. 2. You may experience leakage of urine around your catheter- as long as the  catheter continues to drain, this is normal.  If your catheter stops draining  go to the ER. 3. You may also have blood in your urine, even after it has been clear for  several days; you may even pass some small blood clots or other material.  This  is normal as well.  If this happens, sit down and drink plenty of water to help  make urine to flush out your bladder.  If the blood in your urine becomes worse  after doing this, contact our office or return to the ER. 4. You may use the leg bag (small bag)  during the day, but use the large bag at  night.  Diet:  You may return to your normal diet immediately. Because of the raw surface of your bladder, alcohol, spicy foods, foods high in acid and drinks with caffeine may cause irritation or frequency and should be used in moderation. To keep your urine flowing freely and avoid constipation, drink plenty of fluids during the day (8-10 glasses). Tip: Avoid cranberry juice because it is very acidic.  Activity:  Your physical activity doesn't need to be restricted. However, if you are very active, you may see some blood in the urine. We suggest that you reduce your activity under the circumstances until the bleeding has stopped.  Bowels:  It is important to keep your bowels regular during the postoperative period. Straining with bowel movements can cause bleeding. A bowel movement every other day is reasonable. Use a mild laxative if needed, such as milk of magnesia 2-3 tablespoons, or 2 Dulcolax tablets. Call if you continue to have problems. If you had been taking narcotics for pain, before, during or after your surgery, you may be constipated. Take a laxative if necessary.    Medication:  You should resume your pre-surgery medications unless told not to. In addition you may be given an antibiotic to prevent or treat infection. Antibiotics are not always necessary. All medication should be taken as prescribed until the bottles are finished unless you are having  an unusual reaction to one of the drugs.   DO NOT RESTART eliquis until Friday 10/18.

## 2023-02-09 NOTE — Anesthesia Preprocedure Evaluation (Addendum)
Anesthesia Evaluation  Patient identified by MRN, date of birth, ID band Patient awake    Reviewed: Allergy & Precautions, NPO status , Patient's Chart, lab work & pertinent test results  Airway Mallampati: III  TM Distance: >3 FB Neck ROM: Full    Dental  (+) Poor Dentition, Missing, Loose, Chipped   Pulmonary asthma , COPD    + decreased breath sounds      Cardiovascular hypertension, Pt. on home beta blockers + CAD   Rhythm:Regular Rate:Normal  Echo: 1. Left ventricular ejection fraction, by estimation, is 40 to 45%. The  left ventricle has mildly decreased function. The left ventricle  demonstrates global hypokinesis. Left ventricular diastolic parameters are  indeterminate. There is severe  hypokinesis of the left ventricular, basal-mid inferoseptal wall, inferior  wall and inferolateral wall.   2. Right ventricular systolic function is mildly reduced. The right  ventricular size is normal. There is normal pulmonary artery systolic  pressure.   3. Left atrial size was mildly dilated.   4. Right atrial size was mildly dilated.   5. The mitral valve is degenerative. Trivial mitral valve regurgitation.  No evidence of mitral stenosis.   6. The aortic valve is tricuspid. Aortic valve regurgitation is mild. No  aortic stenosis is present.   7. Aortic dilatation noted. There is dilatation of the aortic root,  measuring 38 mm. There is dilatation of the ascending aorta, measuring 40  mm.     Neuro/Psych  PSYCHIATRIC DISORDERS Anxiety Depression    negative neurological ROS     GI/Hepatic Neg liver ROS,GERD  Medicated,,  Endo/Other  diabetes, Type 2, Oral Hypoglycemic Agents, Insulin Dependent    Renal/GU Renal InsufficiencyRenal disease     Musculoskeletal  (+) Arthritis ,    Abdominal   Peds  Hematology  (+) Blood dyscrasia, anemia   Anesthesia Other Findings   Reproductive/Obstetrics                              Anesthesia Physical Anesthesia Plan  ASA: 3  Anesthesia Plan: General   Post-op Pain Management: Tylenol PO (pre-op)*   Induction: Intravenous  PONV Risk Score and Plan: 3 and Ondansetron and Treatment may vary due to age or medical condition  Airway Management Planned: LMA  Additional Equipment: None  Intra-op Plan:   Post-operative Plan: Extubation in OR  Informed Consent: I have reviewed the patients History and Physical, chart, labs and discussed the procedure including the risks, benefits and alternatives for the proposed anesthesia with the patient or authorized representative who has indicated his/her understanding and acceptance.     Dental advisory given and Interpreter used for interveiw  Plan Discussed with: CRNA  Anesthesia Plan Comments:        Anesthesia Quick Evaluation

## 2023-02-09 NOTE — Transfer of Care (Signed)
Immediate Anesthesia Transfer of Care Note  Patient: Eddie Graham  Procedure(s) Performed: CYSTOSCOPY WITH URETHRAL DILATATION TRANSURETHRAL RESECTION OF BLADDER TUMOR (TURBT)  Patient Location: PACU  Anesthesia Type:General  Level of Consciousness: awake and drowsy  Airway & Oxygen Therapy: Patient Spontanous Breathing and Patient connected to face mask oxygen  Post-op Assessment: Report given to RN and Post -op Vital signs reviewed and stable  Post vital signs: Reviewed and stable  Last Vitals:  Vitals Value Taken Time  BP 119/71 02/09/23 1732  Temp 36.7 C 02/09/23 1732  Pulse 97 02/09/23 1743  Resp 12 02/09/23 1744  SpO2 88 % 02/09/23 1743  Vitals shown include unfiled device data.  Last Pain:  Vitals:   02/09/23 1743  TempSrc:   PainSc: 6          Complications: No notable events documented.

## 2023-02-09 NOTE — Anesthesia Procedure Notes (Signed)
Procedure Name: Intubation Date/Time: 02/09/2023 3:53 PM  Performed by: Deri Fuelling, CRNAPre-anesthesia Checklist: Patient identified, Emergency Drugs available, Suction available and Patient being monitored Patient Re-evaluated:Patient Re-evaluated prior to induction Oxygen Delivery Method: Circle system utilized Preoxygenation: Pre-oxygenation with 100% oxygen Induction Type: IV induction Ventilation: Mask ventilation without difficulty Laryngoscope Size: Mac and 4 Grade View: Grade III Tube type: Oral Tube size: 7.5 mm Number of attempts: 1 Airway Equipment and Method: Stylet and Oral airway Placement Confirmation: ETT inserted through vocal cords under direct vision, positive ETCO2 and breath sounds checked- equal and bilateral Secured at: 21 cm Tube secured with: Tape Dental Injury: Teeth and Oropharynx as per pre-operative assessment

## 2023-02-09 NOTE — Interval H&P Note (Signed)
History and Physical Interval Note: In the interim patient has been cleared by cardiology to hold anticoagulation for procedure.  02/09/2023 2:37 PM  Eddie Graham  has presented today for surgery, with the diagnosis of URETHRAL STRICTURE, GROSS HEMATURIA.  The various methods of treatment have been discussed with the patient and family. After consideration of risks, benefits and other options for treatment, the patient has consented to  Procedure(s): CYSTOSCOPY WITH URETHRAL DILATATION (N/A) POSSIBLE TRANSURETHRAL RESECTION OF BLADDER TUMOR (TURBT) (N/A) as a surgical intervention.  The patient's history has been reviewed, patient examined, no change in status, stable for surgery.  I have reviewed the patient's chart and labs.  Questions were answered to the patient's satisfaction.     [place] Kestner D Filimon Miranda

## 2023-02-09 NOTE — Anesthesia Postprocedure Evaluation (Addendum)
Anesthesia Post Note  Patient: Eddie Graham  Procedure(s) Performed: CYSTOSCOPY WITH URETHRAL DILATATION TRANSURETHRAL RESECTION OF BLADDER TUMOR (TURBT)     Patient location during evaluation: PACU Anesthesia Type: General Level of consciousness: awake and alert, oriented and patient cooperative Pain management: pain level controlled Vital Signs Assessment: post-procedure vital signs reviewed and stable Respiratory status: spontaneous breathing, nonlabored ventilation and respiratory function stable Cardiovascular status: blood pressure returned to baseline and stable Postop Assessment: no apparent nausea or vomiting Anesthetic complications: no Comments: Duoneb administered before and after anesthesia as well as albuterol via ETT after intubation. Incentive spirometer in PACU. Baseline spo2 in the low 90s on room air. Saturating anywhere from 85-97% on room air in PACU, but recovers on his own when he desaturates. At this time I feel he is at his baseline. Uses albuterol inhaler at home, but does not have home oxygen. I suspect some component of OSA as well. Metoprolol IV given for HR in the low 100s in PACU, still in Afib. Taking toprol XR at home.    No notable events documented.  Last Vitals:  Vitals:   02/09/23 1830 02/09/23 1832  BP: 103/77   Pulse: 97 94  Resp: 12 15  Temp:    SpO2: 90% 92%    Last Pain:  Vitals:   02/09/23 1743  TempSrc:   PainSc: 6                  Lannie Fields

## 2023-02-09 NOTE — Op Note (Signed)
PATIENT:  Curran Sanchez-Feliciano  PRE-OPERATIVE DIAGNOSIS:  Urethral stricture Bladder cancer  POST-OPERATIVE DIAGNOSIS: Same  PROCEDURE:  Procedure(s): 1. TRANSURETHRAL RESECTION OF BLADDER TUMOR (TURBT) (>5cm.) 2. Urethral dilation  SURGEON:  Kasandra Knudsen, MD  ANESTHESIA:   General  EBL:  less than 50 mL  DRAINS: Urethral catheter (20 Fr. Foley)   SPECIMEN:  Bladder tumor  DISPOSITION OF SPECIMEN:  PATHOLOGY  Findings: Bulbar urethral stricture wide bore Bilateral orthotopic ureteral orifices Significant bladder tumor recurrence covering approximately 50% of the bladder mucosa  Indication: 88 year old man with a history of bladder cancer and urethral stricture who was unable to have surveillance cystoscopy into the office due to urethral stricture recurrence.  Description of operation: The patient was taken to the operating room and administered general anesthesia. They were then placed on the table and moved to the dorsal lithotomy position after which the genitalia was sterilely prepped and draped. An official timeout was then performed.  A 21 French rigid cystoscope was placed in the urethral meatus and advanced to the bulbar urethra at which time the stricture was seen.  A 0.38 sensor wire was used to cannulate the urethra and advanced into the bladder.  The cystoscope was removed.  Next the Ultrex urethral balloon dilator was placed over the wire and positioned so that the radiopaque marker spanned the stricture.  The balloon was inflated to 18 mmHg and kept in place for 3 minutes.  It was then deflated.  Cystoscopy then took place alongside the wire and numerous bladder tumors were seen throughout the bladder consistent with bladder cancer recurrence.  Next, the 11 French resectoscope with the 30 lens and visual obturator were then passed into the bladder under direct visualization. Urethra appeared normal. The visual obturator was then removed and the Gyrus  resectoscope element with 30  lens was then inserted and the bladder was fully and systematically inspected. Ureteral orifices were noted to be in the normal anatomic positions.   Resection of bladder tumors took place starting in the left lateral wall.  Resection continued in standard fashion until no remaining bladder tumors could be seen.  There was significant burden at the dome which was difficult to reach due to the resectoscope being hubbed.  The bladder tumor were evacuated from the bladder with a Toomey syringe.  Hemostasis was achieved with bipolar electrocautery.  It was noted to be adequate with the irrigant turned off.  Reinspection of the bladder revealed all obvious tumor had been fully resected and there was no evidence of perforation.  The resectoscope was removed.  A 20 French Foley catheter was then inserted in the bladder and irrigated. The irrigant returned slightly pink with no clots. The patient was awakened and taken to the recovery room.  Plan to discharge home with the Foley catheter in place.  PLAN OF CARE: Discharge to home after PACU  PATIENT DISPOSITION:  PACU - hemodynamically stable.

## 2023-02-10 ENCOUNTER — Encounter (HOSPITAL_COMMUNITY): Payer: Self-pay | Admitting: Urology

## 2023-02-11 LAB — SURGICAL PATHOLOGY

## 2023-02-14 IMAGING — CT CT CTA ABD/PEL W/CM AND/OR W/O CM
2 of 6 series · 15 of 46 positions shown, 17 images · IV contrast (APPLIED)
Comparison: 07/10/2021

CLINICAL DATA: Saccular abdominal aortic aneurysm

EXAM:
CTA ABDOMEN AND PELVIS WITHOUT AND WITH CONTRAST
TECHNIQUE: Multidetector CT imaging of the abdomen and pelvis was performed
using the standard protocol during bolus administration of
intravenous contrast. Multiplanar reconstructed images and MIPs were
obtained and reviewed to evaluate the vascular anatomy.

[Series 5: arterial · axial · arterial · 0.79mm/px · z∈[+624,+1026]mm · 12 of 230 slices shown, 14 images]
[im 19/230  soft-tissue]
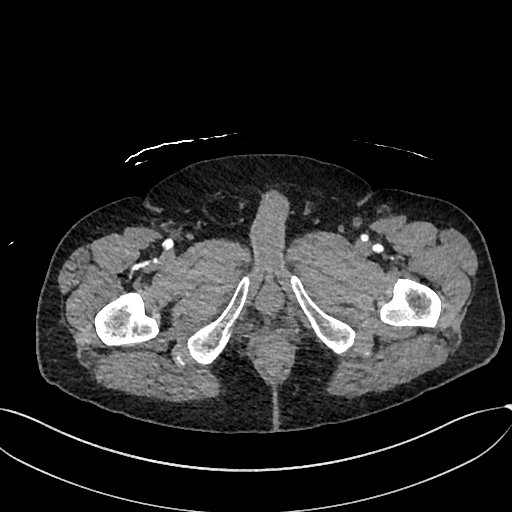
[im 19/230  bone]
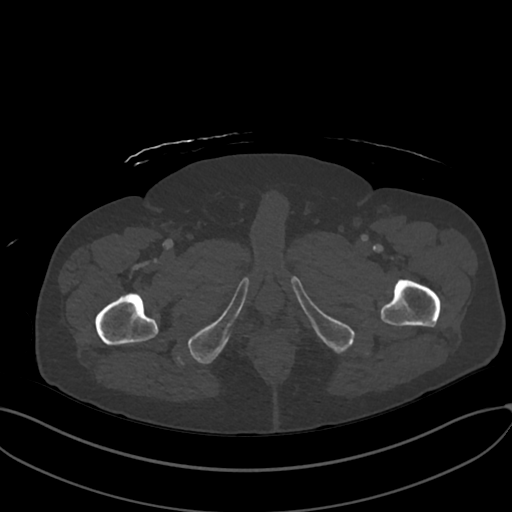
[im 37/230  soft-tissue]
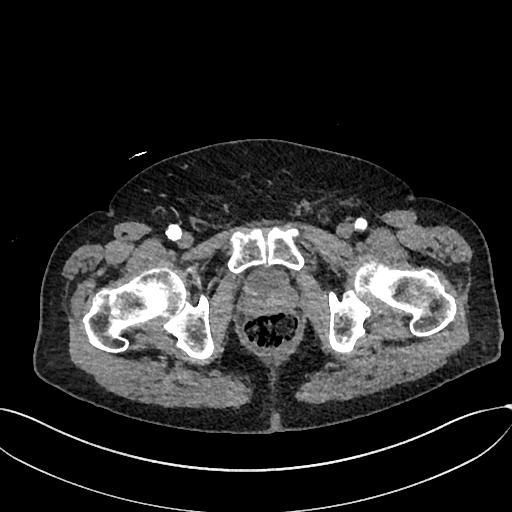
[im 55/230  soft-tissue]
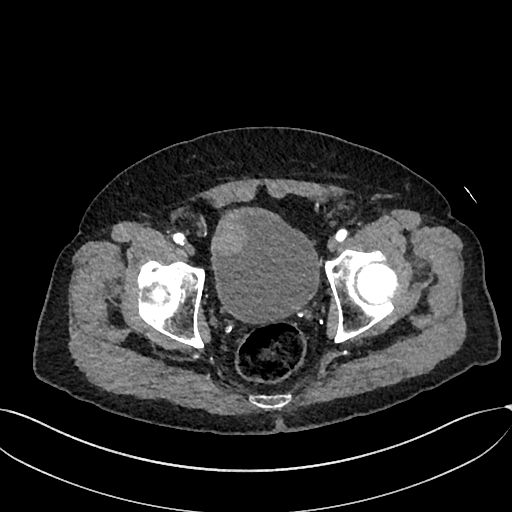
[im 74/230  soft-tissue]
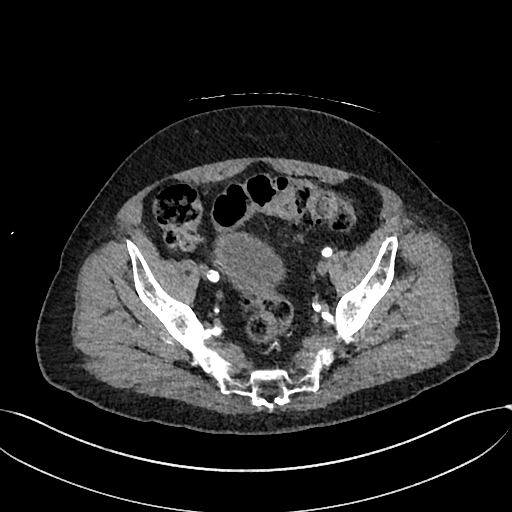
[im 92/230  soft-tissue]
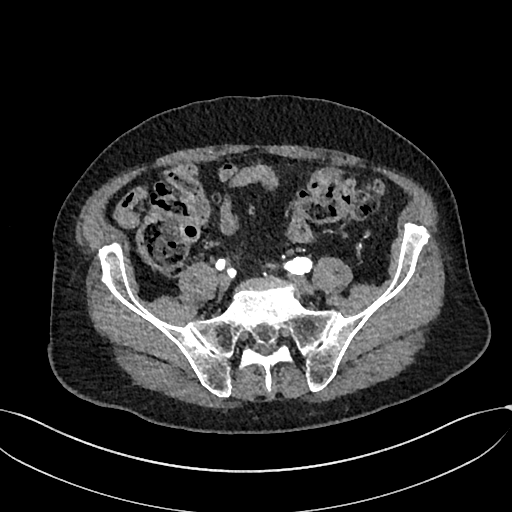
[im 110/230  soft-tissue]
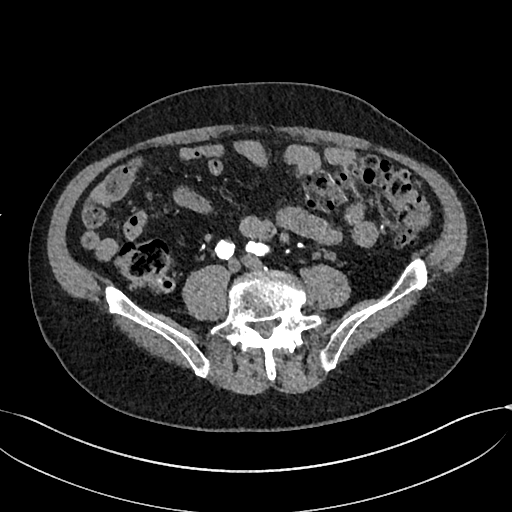
[im 129/230  soft-tissue]
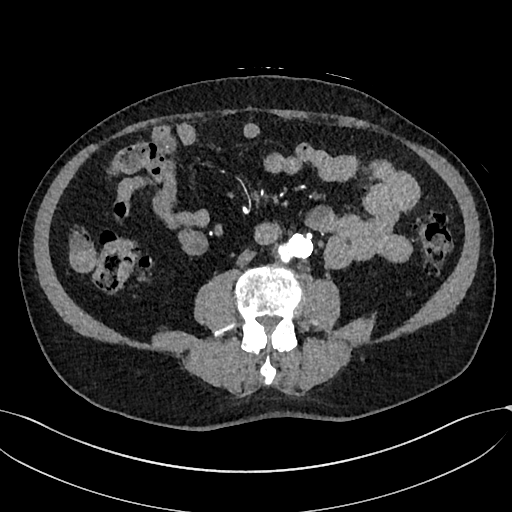
[im 147/230  soft-tissue]
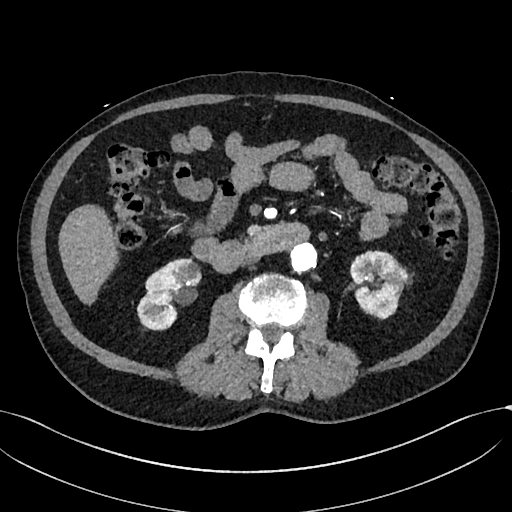
[im 165/230  soft-tissue]
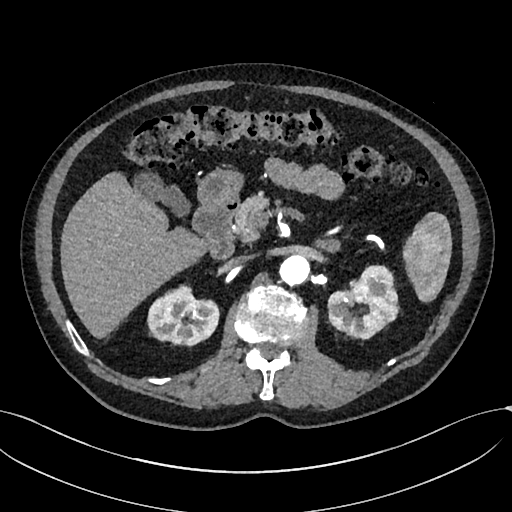
[im 165/230  bone]
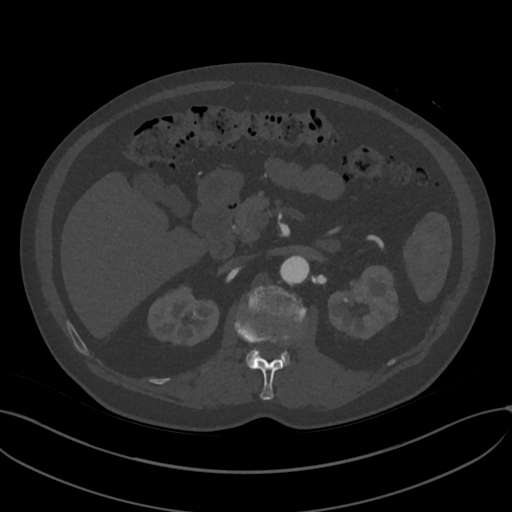
[im 184/230  soft-tissue]
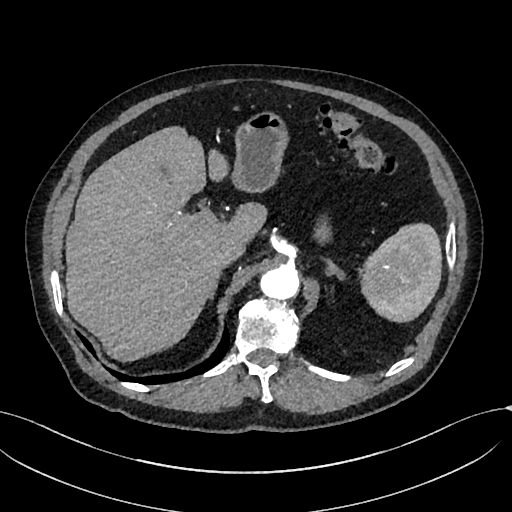
[im 202/230  soft-tissue]
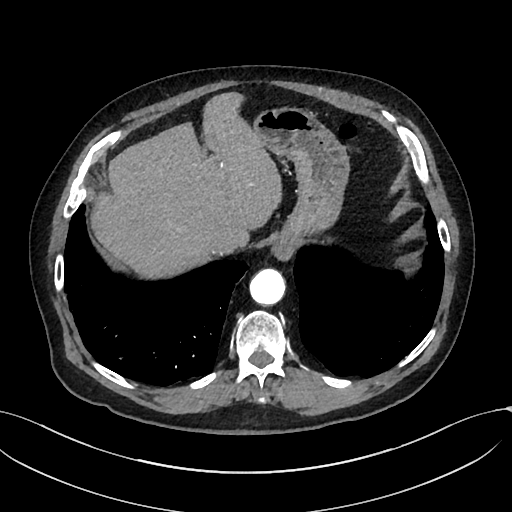
[im 220/230  soft-tissue]
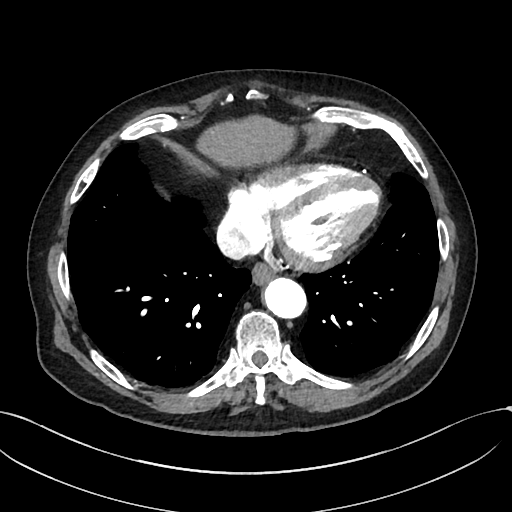

[Series 8: cor · coronal · 0.75mm/px · 3 of 149 slices shown]
[im 38/149  soft-tissue]
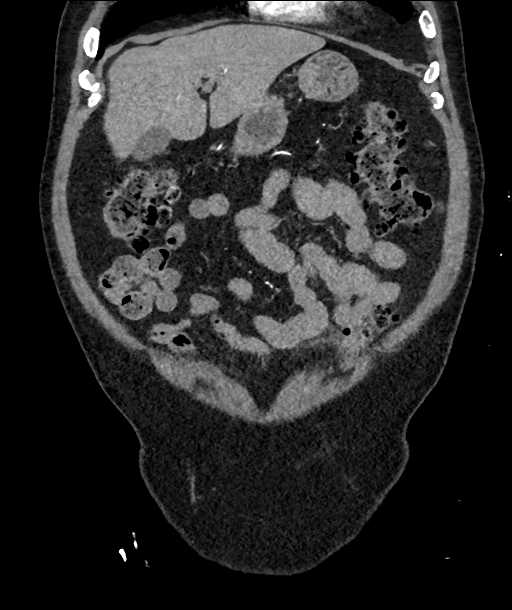
[im 75/149  soft-tissue]
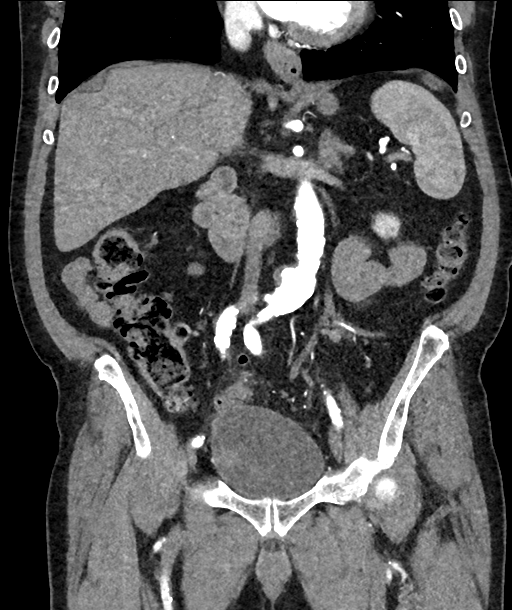
[im 112/149  soft-tissue]
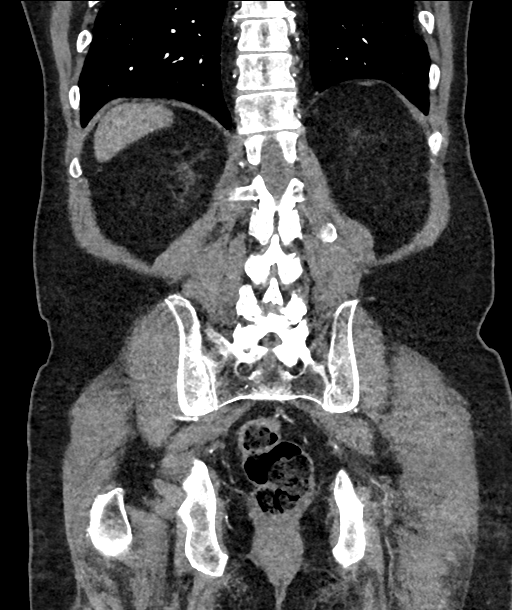

[15 of 46 positions shown; findings below may reference images not displayed]

RADIATION DOSE REDUCTION: This exam was performed according to the
departmental dose-optimization program which includes automated
exposure control, adjustment of the mA and/or kV according to
patient size and/or use of iterative reconstruction technique.

CONTRAST:  100mL OMNIPAQUE IOHEXOL 350 MG/ML SOLN
FINDINGS: VASCULAR

Aorta: Moderate partially calcified atheromatous plaque. Fusiform
dilatation of the infrarenal segment with a right lateral saccular
component just below the origin of the IMA, resulting in maximum
transverse diameter 3.3 cm (previously 3.2). There is some smooth
eccentric nonocclusive mural thrombus in the saccular component of
the aneurysm. No dissection or stenosis.

Celiac: 1.5 cm eccentric thrombosed saccular aneurysm as before. No
definite dissection or stenosis. Distal branches patent.

SMA: Partially calcified ostial plaque without significant stenosis,
ectatic in its midportion up to 1.1 cm diameter, with scattered
nonocclusive atheromatous plaque.

Renals: Single bilaterally, both with calcified ostial plaque but no
significant stenosis.

IMA: Patent without evidence of aneurysm, dissection, vasculitis or
significant stenosis.

Inflow: Moderate calcified atheromatous plaque throughout the iliac
arterial systems without dissection or stenosis. Right common iliac
up to 1.3 cm diameter, left 1.4 cm.

Proximal Outflow: Atheromatous, patent.

Veins: No obvious venous abnormality within the limitations of this
arterial phase study.

Review of the MIP images confirms the above findings.

NON-VASCULAR

Lower chest: No pleural or pericardial effusion.

Hepatobiliary: Stable segment 3 cyst or dilated duct. No new liver
lesion or calcified gallstone. Gallbladder nondistended.

Pancreas: Unremarkable. No pancreatic ductal dilatation or
surrounding inflammatory changes.

Spleen: Normal in size without focal abnormality.

Adrenals/Urinary Tract: No adrenal mass. 1.6 cm right lower pole
renal cyst present since 6152. No hydronephrosis. 3.3 cm enhancing
right bladder wall mass protruding into the lumen of the urinary
bladder.

Stomach/Bowel: Stomach nondistended. Small bowel decompressed.
Normal appendix. Scattered diverticula throughout the nondilated
colon, without significant adjacent inflammatory/edematous change.

Lymphatic: No abdominal or pelvic adenopathy.

Reproductive: Prostate normal in caliber, containing central coarse
calcifications.

Other: Bilateral pelvic phleboliths.  No ascites.  No free air.

Musculoskeletal: Multilevel lumbar spondylitic change. No fracture
or worrisome bone lesion.
IMPRESSION: 1. 3.3 cm saccular aneurysm of the infrarenal abdominal aorta.
Guidelines recommend referral to a vascular specialist (the patient
is currently under the care of a vascular specialist/surgeon).
2. Stable 1.5 cm saccular aneurysm of the celiac axis.
3. 3.3 cm right bladder wall mass suggesting transitional cell
carcinoma. If not previously obtained, recommend urologic
consultation.
4. Colonic diverticulosis

## 2023-02-25 ENCOUNTER — Other Ambulatory Visit: Payer: Self-pay

## 2023-02-25 ENCOUNTER — Inpatient Hospital Stay (HOSPITAL_COMMUNITY)
Admission: EM | Admit: 2023-02-25 | Discharge: 2023-03-01 | DRG: 193 | Disposition: A | Discharge status: Home / Self Care | Payer: 59 | Attending: Student | Admitting: Student

## 2023-02-25 ENCOUNTER — Emergency Department (HOSPITAL_COMMUNITY): Payer: 59

## 2023-02-25 ENCOUNTER — Encounter (HOSPITAL_COMMUNITY): Payer: Self-pay | Admitting: Emergency Medicine

## 2023-02-25 DIAGNOSIS — I7143 Infrarenal abdominal aortic aneurysm, without rupture: Secondary | ICD-10-CM | POA: Diagnosis present

## 2023-02-25 DIAGNOSIS — I4892 Unspecified atrial flutter: Secondary | ICD-10-CM | POA: Diagnosis present

## 2023-02-25 DIAGNOSIS — Z6834 Body mass index (BMI) 34.0-34.9, adult: Secondary | ICD-10-CM

## 2023-02-25 DIAGNOSIS — N1832 Chronic kidney disease, stage 3b: Secondary | ICD-10-CM | POA: Diagnosis present

## 2023-02-25 DIAGNOSIS — Z933 Colostomy status: Secondary | ICD-10-CM | POA: Diagnosis not present

## 2023-02-25 DIAGNOSIS — N179 Acute kidney failure, unspecified: Secondary | ICD-10-CM | POA: Diagnosis not present

## 2023-02-25 DIAGNOSIS — I2510 Atherosclerotic heart disease of native coronary artery without angina pectoris: Secondary | ICD-10-CM | POA: Diagnosis present

## 2023-02-25 DIAGNOSIS — Z794 Long term (current) use of insulin: Secondary | ICD-10-CM | POA: Diagnosis not present

## 2023-02-25 DIAGNOSIS — Y95 Nosocomial condition: Secondary | ICD-10-CM | POA: Diagnosis present

## 2023-02-25 DIAGNOSIS — J440 Chronic obstructive pulmonary disease with acute lower respiratory infection: Secondary | ICD-10-CM | POA: Diagnosis present

## 2023-02-25 DIAGNOSIS — J441 Chronic obstructive pulmonary disease with (acute) exacerbation: Principal | ICD-10-CM | POA: Diagnosis present

## 2023-02-25 DIAGNOSIS — Z1152 Encounter for screening for COVID-19: Secondary | ICD-10-CM | POA: Diagnosis not present

## 2023-02-25 DIAGNOSIS — J9601 Acute respiratory failure with hypoxia: Secondary | ICD-10-CM | POA: Diagnosis not present

## 2023-02-25 DIAGNOSIS — J189 Pneumonia, unspecified organism: Secondary | ICD-10-CM | POA: Diagnosis present

## 2023-02-25 DIAGNOSIS — E669 Obesity, unspecified: Secondary | ICD-10-CM | POA: Diagnosis present

## 2023-02-25 DIAGNOSIS — Z951 Presence of aortocoronary bypass graft: Secondary | ICD-10-CM

## 2023-02-25 DIAGNOSIS — E1122 Type 2 diabetes mellitus with diabetic chronic kidney disease: Secondary | ICD-10-CM | POA: Diagnosis present

## 2023-02-25 DIAGNOSIS — T502X5A Adverse effect of carbonic-anhydrase inhibitors, benzothiadiazides and other diuretics, initial encounter: Secondary | ICD-10-CM | POA: Diagnosis not present

## 2023-02-25 DIAGNOSIS — E785 Hyperlipidemia, unspecified: Secondary | ICD-10-CM | POA: Diagnosis present

## 2023-02-25 DIAGNOSIS — R0602 Shortness of breath: Secondary | ICD-10-CM | POA: Diagnosis present

## 2023-02-25 DIAGNOSIS — K769 Liver disease, unspecified: Secondary | ICD-10-CM | POA: Diagnosis present

## 2023-02-25 DIAGNOSIS — I5022 Chronic systolic (congestive) heart failure: Secondary | ICD-10-CM | POA: Diagnosis present

## 2023-02-25 DIAGNOSIS — I480 Paroxysmal atrial fibrillation: Secondary | ICD-10-CM | POA: Diagnosis present

## 2023-02-25 DIAGNOSIS — Z8551 Personal history of malignant neoplasm of bladder: Secondary | ICD-10-CM | POA: Diagnosis not present

## 2023-02-25 DIAGNOSIS — Z7984 Long term (current) use of oral hypoglycemic drugs: Secondary | ICD-10-CM

## 2023-02-25 DIAGNOSIS — Z7901 Long term (current) use of anticoagulants: Secondary | ICD-10-CM

## 2023-02-25 DIAGNOSIS — G8929 Other chronic pain: Secondary | ICD-10-CM | POA: Diagnosis present

## 2023-02-25 DIAGNOSIS — I130 Hypertensive heart and chronic kidney disease with heart failure and stage 1 through stage 4 chronic kidney disease, or unspecified chronic kidney disease: Secondary | ICD-10-CM | POA: Diagnosis present

## 2023-02-25 DIAGNOSIS — Z79899 Other long term (current) drug therapy: Secondary | ICD-10-CM

## 2023-02-25 DIAGNOSIS — Z8546 Personal history of malignant neoplasm of prostate: Secondary | ICD-10-CM

## 2023-02-25 DIAGNOSIS — I5023 Acute on chronic systolic (congestive) heart failure: Secondary | ICD-10-CM | POA: Diagnosis not present

## 2023-02-25 DIAGNOSIS — N400 Enlarged prostate without lower urinary tract symptoms: Secondary | ICD-10-CM | POA: Diagnosis present

## 2023-02-25 DIAGNOSIS — J159 Unspecified bacterial pneumonia: Secondary | ICD-10-CM | POA: Diagnosis present

## 2023-02-25 DIAGNOSIS — R0682 Tachypnea, not elsewhere classified: Secondary | ICD-10-CM | POA: Diagnosis present

## 2023-02-25 DIAGNOSIS — E118 Type 2 diabetes mellitus with unspecified complications: Secondary | ICD-10-CM

## 2023-02-25 DIAGNOSIS — Z9079 Acquired absence of other genital organ(s): Secondary | ICD-10-CM

## 2023-02-25 LAB — BASIC METABOLIC PANEL
Anion gap: 10 (ref 5–15)
BUN: 17 mg/dL (ref 8–23)
CO2: 23 mmol/L (ref 22–32)
Calcium: 8.8 mg/dL — ABNORMAL LOW (ref 8.9–10.3)
Chloride: 105 mmol/L (ref 98–111)
Creatinine, Ser: 1.61 mg/dL — ABNORMAL HIGH (ref 0.61–1.24)
GFR, Estimated: 41 mL/min — ABNORMAL LOW (ref >60)
Glucose, Bld: 62 mg/dL — ABNORMAL LOW (ref 70–99)
Potassium: 4 mmol/L (ref 3.5–5.1)
Sodium: 138 mmol/L (ref 135–145)

## 2023-02-25 LAB — CBC
HCT: 41 % (ref 39.0–52.0)
Hemoglobin: 13.1 g/dL (ref 13.0–17.0)
MCH: 29.4 pg (ref 26.0–34.0)
MCHC: 32 g/dL (ref 30.0–36.0)
MCV: 92.1 fL (ref 80.0–100.0)
Platelets: 230 10*3/uL (ref 150–400)
RBC: 4.45 MIL/uL (ref 4.22–5.81)
RDW: 15.3 % (ref 11.5–15.5)
WBC: 7.7 10*3/uL (ref 4.0–10.5)
nRBC: 0 % (ref 0.0–0.2)

## 2023-02-25 LAB — TROPONIN I (HIGH SENSITIVITY)
Troponin I (High Sensitivity): 22 ng/L — ABNORMAL HIGH (ref <18)
Troponin I (High Sensitivity): 23 ng/L — ABNORMAL HIGH (ref <18)

## 2023-02-25 LAB — GLUCOSE, CAPILLARY: Glucose-Capillary: 136 mg/dL — ABNORMAL HIGH (ref 70–99)

## 2023-02-25 LAB — BRAIN NATRIURETIC PEPTIDE: B Natriuretic Peptide: 252.8 pg/mL — ABNORMAL HIGH (ref 0.0–100.0)

## 2023-02-25 MED ORDER — ENOXAPARIN SODIUM 40 MG/0.4ML IJ SOSY: 40.0000 mg | PREFILLED_SYRINGE | INTRAMUSCULAR | Status: DC

## 2023-02-25 MED ORDER — ONDANSETRON HCL 4 MG PO TABS: 4.0000 mg | ORAL_TABLET | Freq: Four times a day (QID) | ORAL | Status: DC | PRN

## 2023-02-25 MED ORDER — SODIUM CHLORIDE 0.9 % IV SOLN: 2.0000 g | INTRAVENOUS | Status: DC

## 2023-02-25 MED ORDER — DULOXETINE HCL 60 MG PO CPEP
60.0000 mg | ORAL_CAPSULE | Freq: Every day | ORAL | Status: DC
  Administered 2023-02-26 – 2023-03-01 (×4): 60 mg via ORAL
  Filled 2023-02-25 (×4): qty 1

## 2023-02-25 MED ORDER — FUROSEMIDE 10 MG/ML IJ SOLN
20.0000 mg | Freq: Once | INTRAMUSCULAR | Status: AC
  Administered 2023-02-25: 20 mg via INTRAVENOUS
  Filled 2023-02-25: qty 2

## 2023-02-25 MED ORDER — INSULIN ASPART 100 UNIT/ML IJ SOLN: 0.0000 [IU] | Freq: Three times a day (TID) | INTRAMUSCULAR | Status: DC

## 2023-02-25 MED ORDER — SODIUM CHLORIDE 0.9 % IV SOLN
1.0000 g | Freq: Once | INTRAVENOUS | Status: AC
  Administered 2023-02-25: 1 g via INTRAVENOUS
  Filled 2023-02-25: qty 10

## 2023-02-25 MED ORDER — INSULIN ASPART 100 UNIT/ML IJ SOLN
0.0000 [IU] | Freq: Every day | INTRAMUSCULAR | Status: DC
  Administered 2023-02-27: 2 [IU] via SUBCUTANEOUS

## 2023-02-25 MED ORDER — ACETAMINOPHEN 325 MG PO TABS
650.0000 mg | ORAL_TABLET | Freq: Four times a day (QID) | ORAL | Status: DC | PRN
  Administered 2023-02-27 – 2023-02-28 (×3): 650 mg via ORAL
  Filled 2023-02-25 (×3): qty 2

## 2023-02-25 MED ORDER — OXYCODONE HCL 5 MG PO TABS
5.0000 mg | ORAL_TABLET | ORAL | Status: DC | PRN
  Administered 2023-02-25 – 2023-03-01 (×12): 5 mg via ORAL
  Filled 2023-02-25 (×12): qty 1

## 2023-02-25 MED ORDER — ROSUVASTATIN CALCIUM 20 MG PO TABS
40.0000 mg | ORAL_TABLET | Freq: Every day | ORAL | Status: DC
Start: 2023-02-26 — End: 2023-03-01
  Administered 2023-02-26 – 2023-03-01 (×4): 40 mg via ORAL
  Filled 2023-02-25 (×4): qty 2

## 2023-02-25 MED ORDER — IPRATROPIUM-ALBUTEROL 0.5-2.5 (3) MG/3ML IN SOLN
3.0000 mL | Freq: Once | RESPIRATORY_TRACT | Status: AC
  Administered 2023-02-25: 3 mL via RESPIRATORY_TRACT
  Filled 2023-02-25: qty 3

## 2023-02-25 MED ORDER — APIXABAN 2.5 MG PO TABS
2.5000 mg | ORAL_TABLET | Freq: Two times a day (BID) | ORAL | Status: DC
  Administered 2023-02-25 – 2023-03-01 (×8): 2.5 mg via ORAL
  Filled 2023-02-25 (×8): qty 1

## 2023-02-25 MED ORDER — DEXTROSE 5 % IV SOLN
500.0000 mg | Freq: Once | INTRAVENOUS | Status: AC
  Administered 2023-02-25: 500 mg via INTRAVENOUS
  Filled 2023-02-25: qty 5

## 2023-02-25 MED ORDER — METOPROLOL SUCCINATE ER 100 MG PO TB24
100.0000 mg | ORAL_TABLET | Freq: Every day | ORAL | Status: DC
Start: 2023-02-26 — End: 2023-03-01
  Administered 2023-02-26 – 2023-03-01 (×4): 100 mg via ORAL
  Filled 2023-02-25 (×4): qty 1

## 2023-02-25 MED ORDER — GABAPENTIN 100 MG PO CAPS
100.0000 mg | ORAL_CAPSULE | Freq: Three times a day (TID) | ORAL | Status: DC
  Administered 2023-02-25 – 2023-03-01 (×11): 100 mg via ORAL
  Filled 2023-02-25 (×10): qty 1

## 2023-02-25 MED ORDER — IPRATROPIUM-ALBUTEROL 0.5-2.5 (3) MG/3ML IN SOLN
3.0000 mL | Freq: Four times a day (QID) | RESPIRATORY_TRACT | Status: DC
  Administered 2023-02-25 – 2023-02-26 (×3): 3 mL via RESPIRATORY_TRACT
  Filled 2023-02-25 (×3): qty 3

## 2023-02-25 MED ORDER — TAMSULOSIN HCL 0.4 MG PO CAPS
0.4000 mg | ORAL_CAPSULE | Freq: Every day | ORAL | Status: DC
  Administered 2023-02-26 – 2023-03-01 (×4): 0.4 mg via ORAL
  Filled 2023-02-25 (×4): qty 1

## 2023-02-25 MED ORDER — ACETAMINOPHEN 650 MG RE SUPP: 650.0000 mg | Freq: Four times a day (QID) | RECTAL | Status: DC | PRN

## 2023-02-25 MED ORDER — IOHEXOL 350 MG/ML SOLN
60.0000 mL | Freq: Once | INTRAVENOUS | Status: AC | PRN
  Administered 2023-02-25: 60 mL via INTRAVENOUS

## 2023-02-25 MED ORDER — ONDANSETRON HCL 4 MG/2ML IJ SOLN: 4.0000 mg | Freq: Four times a day (QID) | INTRAMUSCULAR | Status: DC | PRN

## 2023-02-25 MED ORDER — AZITHROMYCIN 250 MG PO TABS: 500.0000 mg | ORAL_TABLET | Freq: Every day | ORAL | Status: DC

## 2023-02-25 MED ORDER — ORAL CARE MOUTH RINSE: 15.0000 mL | OROMUCOSAL | Status: DC | PRN

## 2023-02-25 NOTE — ED Notes (Signed)
PT was given phone to contact his daughter.

## 2023-02-25 NOTE — ED Notes (Signed)
PT ambulated to the bathroom to urinate

## 2023-02-25 NOTE — ED Triage Notes (Addendum)
Pt from home via [facility] with reports of SHOB, abdominal swelling, and bilateral leg swelling. Pt received 2 nitroglycerin and 324mg  ASA.

## 2023-02-25 NOTE — ED Notes (Signed)
Please update the family [phone]

## 2023-02-25 NOTE — ED Notes (Signed)
Patient transported to CT 

## 2023-02-25 NOTE — ED Notes (Addendum)
PT ambulated to the bathroom w/o oxygen , was little winded but when he got back into his room and hooked up, his O2 became 95%

## 2023-02-25 NOTE — Plan of Care (Signed)
  Problem: Education: Goal: Knowledge of General Education information will improve Description Including pain rating scale, medication(s)/side effects and non-pharmacologic comfort measures Outcome: Progressing   

## 2023-02-25 NOTE — ED Provider Notes (Signed)
[place] EMERGENCY DEPARTMENT AT [facility] Provider Note   CSN: [id number] Arrival date & time: 02/25/23  1424     History  Chief Complaint  Patient presents with   Chest Pain    Eddie Graham is a 88 y.o. male.  Presenting emergency department with worsening shortness of breath for the past week.  Does not use oxygen at home.  He also notes having left-sided chest pain and abdominal pain.  No nausea no vomiting.  Moving bowels this morning.  Does take Lasix, reportedly not missed any doses.   Chest Pain      Home Medications Prior to Admission medications   Medication Sig Start Date End Date Taking? Authorizing Provider  acetaminophen (TYLENOL) 650 MG CR tablet Take 650 mg by mouth every 8 (eight) hours as needed for pain. 03/26/22   [provider]  albuterol (VENTOLIN HFA) 108 (90 Base) MCG/ACT inhaler Inhale 2 puffs into the lungs every 4 (four) hours as needed for wheezing. 11/11/20   [provider]  Ernestina Patches 62.5-25 MCG/ACT AEPB Inhale 1 puff into the lungs daily.    [provider]  apixaban (ELIQUIS) 2.5 MG TABS tablet Take 1 tablet (2.5 mg total) by mouth 2 (two) times daily. 01/29/23   Marguerita Merles Latif, DO  Carboxymethylcellulose Sodium (EYE DROPS OP) Place 2 drops into both eyes as needed (dryness/itching).    [provider]  diclofenac Sodium (VOLTAREN) 1 % GEL Apply 2 g topically 4 (four) times daily. 12/20/21   Joseph Art, DO  DULoxetine (CYMBALTA) 60 MG capsule Take 30-60 mg by mouth daily. 02/07/21   [provider]  ferrous sulfate 325 (65 FE) MG tablet Take 325 mg by mouth daily with breakfast. 08/21/22   [provider]  furosemide (LASIX) 20 MG tablet Take 1 tablet (20 mg total) by mouth daily. 01/30/23   Marguerita Merles Latif, DO  gabapentin (NEURONTIN) 100 MG capsule Take 100 mg by mouth 3 (three) times daily. 02/25/21   [provider]  glipiZIDE (GLUCOTROL) 10  MG tablet Take 10 mg by mouth 2 (two) times daily before a meal. 03/25/21   [provider]  Insulin Pen Needle (TECHLITE PEN NEEDLES) 32G X 4 MM MISC Use to inject Lantus at bedtime as directed 06/15/22   Rhetta Mura, MD  LANTUS SOLOSTAR 100 UNIT/ML Solostar Pen Inject 15 Units into the skin at bedtime. Patient taking differently: Inject 24 Units into the skin at bedtime. 06/15/22   Rhetta Mura, MD  lidocaine (LIDODERM) 5 % Place 1 patch onto the skin daily. Remove & Discard patch within 12 hours or as directed by MD 11/01/21   Blue, Soijett A, PA-C  Melatonin 5 MG CAPS Take 5 mg by mouth at bedtime as needed (sleep). 08/16/19   [provider]  metoprolol succinate (TOPROL-XL) 100 MG 24 hr tablet Take 1 tablet (100 mg total) by mouth daily. Take with or immediately following a meal. 01/30/23   Sheikh, Omair Latif, DO  montelukast (SINGULAIR) 10 MG tablet Take 10 mg by mouth daily. 03/19/21   [provider]  ondansetron (ZOFRAN) 4 MG tablet Take 1 tablet (4 mg total) by mouth every 6 (six) hours as needed for nausea. Patient not taking: Reported on 02/03/2023 01/29/23   Marguerita Merles Latif, DO  pantoprazole (PROTONIX) 40 MG tablet Take 1 tablet by mouth 2 times daily. Patient not taking: Reported on 02/03/2023 06/15/22   Rhetta Mura, MD  pioglitazone (ACTOS) 30 MG  tablet Take 30 mg by mouth daily. 12/22/21   [provider]  polyethylene glycol (MIRALAX / GLYCOLAX) 17 g packet Take 17 g by mouth daily as needed for mild constipation. 03/24/22   [provider]  rosuvastatin (CRESTOR) 40 MG tablet Take 40 mg by mouth daily. 04/07/21   [provider]  Semaglutide 14 MG TABS Take 14 mg by mouth daily. Patient not taking: Reported on 02/03/2023 03/14/21   [provider]  senna (SENOKOT) 8.6 MG TABS tablet Take 1 tablet (8.6 mg total) by mouth daily. Patient not taking: Reported on 02/03/2023 03/13/22   Quincy Simmonds, MD   tamsulosin (FLOMAX) 0.4 MG CAPS capsule Take 0.4 mg by mouth daily. 11/23/21   [provider]  traMADol (ULTRAM) 50 MG tablet Take 50 mg by mouth every 8 (eight) hours as needed for moderate pain. 10/14/22   [provider]  traMADol (ULTRAM) 50 MG tablet Take 1 tablet (50 mg total) by mouth every 6 (six) hours as needed. 02/09/23 02/09/24  Noel Christmas, MD  traZODone (DESYREL) 50 MG tablet Take 50 mg by mouth daily. 03/14/21   [provider]      Allergies    Patient has no known allergies.    Review of Systems   Review of Systems  Cardiovascular:  Positive for chest pain.    Physical Exam Updated Vital Signs BP 130/82 (BP Location: Right Arm)   Pulse 93   Temp 98 F (36.7 C) (Oral)   Resp 20   Ht 5\' 6"  (1.676 m)   Wt 94.1 kg   SpO2 97%   BMI 33.49 kg/m  Physical Exam Vitals and nursing note reviewed.  HENT:     Head: Normocephalic.  Cardiovascular:     Rate and Rhythm: Normal rate and regular rhythm.     Heart sounds: Normal heart sounds.  Pulmonary:     Effort: Tachypnea present.     Comments: Coarse breath sounds throughout lungs Abdominal:     Palpations: Abdomen is soft.  Musculoskeletal:     Right lower leg: No edema.     Left lower leg: No edema.  Skin:    General: Skin is warm.  Neurological:     General: No focal deficit present.     Mental Status: He is alert.  Psychiatric:        Mood and Affect: Mood normal.        Behavior: Behavior normal.     ED Results / Procedures / Treatments   Labs (all labs ordered are listed, but only abnormal results are displayed) Labs Reviewed  BASIC METABOLIC PANEL - Abnormal; Notable for the following components:      Result Value   Glucose, Bld 62 (*)    Creatinine, Ser 1.61 (*)    Calcium 8.8 (*)    GFR, Estimated 41 (*)    All other components within normal limits  BRAIN NATRIURETIC PEPTIDE - Abnormal; Notable for the following components:   B Natriuretic Peptide 252.8 (*)     All other components within normal limits  GLUCOSE, CAPILLARY - Abnormal; Notable for the following components:   Glucose-Capillary 136 (*)    All other components within normal limits  TROPONIN I (HIGH SENSITIVITY) - Abnormal; Notable for the following components:   Troponin I (High Sensitivity) 22 (*)    All other components within normal limits  TROPONIN I (HIGH SENSITIVITY) - Abnormal; Notable for the following components:   Troponin I (High Sensitivity)  23 (*)    All other components within normal limits  CBC  BASIC METABOLIC PANEL  CBC    EKG EKG Interpretation Date/Time:  Thursday February 25 2023 14:44:39 EDT Ventricular Rate:  65 PR Interval:  234 QRS Duration:  157 QT Interval:  463 QTC Calculation: 482 R Axis:   -68  Text Interpretation: Atrial flutter Paired ventricular premature complexes Prolonged PR interval Right bundle branch block Left ventricular hypertrophy Inferior infarct, age indeterminate Baseline wander in lead(s) V5 Partial missing lead(s): V5 Confirmed by Estanislado Pandy [phone]) on 02/25/2023 4:44:42 PM  Radiology CT ABDOMEN PELVIS W CONTRAST  Result Date: 02/25/2023 CLINICAL DATA:  Abdominal swelling, suspected bowel obstruction EXAM: CT ABDOMEN AND PELVIS WITH CONTRAST TECHNIQUE: Multidetector CT imaging of the abdomen and pelvis was performed using the standard protocol following bolus administration of intravenous contrast. RADIATION DOSE REDUCTION: This exam was performed according to the departmental dose-optimization program which includes automated exposure control, adjustment of the mA and/or kV according to patient size and/or use of iterative reconstruction technique. CONTRAST:  60mL OMNIPAQUE IOHEXOL 350 MG/ML SOLN COMPARISON:  07/18/2022 FINDINGS: Lower chest: Small left pleural effusion with adjacent atelectasis/consolidation at the left lung base, new since previous. Coronary and aortic calcifications. Hepatobiliary: 1.3 cm enhancing lesion in  hepatic segment 5, inconspicuous on the delayed scan and not evident on prior study which had slightly different contrast timing as well. No biliary ductal dilatation. Gallbladder normal. Pancreas: Unremarkable. No pancreatic ductal dilatation or surrounding inflammatory changes. Spleen: Normal in size without focal abnormality. Adrenals/Urinary Tract: No adrenal mass. No hydronephrosis. Stable 1.6 cm 16 HU probable cyst right lower pole; no follow-up recommended. Urinary bladder nondistended. Stomach/Bowel: Stomach incompletely distended, without acute finding. Small bowel decompressed. A loop of small bowel is involved in a left lower quadrant parastomal her hernia, without obstruction or strangulation. Normal appendix. Multiple colonic diverticula, without adjacent inflammatory change. Left lower quadrant colostomy. Decompressed Hartmann's pouch. Vascular/Lymphatic: Extensive calcified aortoiliac atheromatous plaque. Saccular aneurysm from the right posterolateral aspect of the infrarenal aorta, up to 3.3 cm diameter (previously 3.1). Left portal vein patent. No abdominal or pelvic adenopathy. Reproductive: Prostate is unremarkable. Other: No ascites.  No free air. Musculoskeletal: Sternotomy wires. Mild lumbar levoscoliosis, with mild multilevel spondylitic change most marked L5-S1. IMPRESSION: 1. No acute findings. 2. Left lower quadrant parastomal hernia containing a loop of small bowel, without obstruction or strangulation. 3. 3.3 cm infrarenal saccular aortic aneurysm. Recommend referral to or continued care with vascular specialist. (Ref.: J Vasc Surg. 2018; 67:2-77 and J Am Coll Radiol 2013;10(10):789-794.) 4. 1.3 cm enhancing lesion in hepatic segment 5, possible FNH but nonspecific. MR liver with contrast may allow definitive characterization. 5. Small left pleural effusion with adjacent atelectasis/consolidation at the left lung base, new since previous. 6.  Aortic Atherosclerosis (ICD10-I70.0).  Electronically Signed   By: Corlis Leak M.D.   On: 02/25/2023 19:27   DG Chest Portable 1 View  Result Date: 02/25/2023 CLINICAL DATA:  Chest pain.  Shortness of breath. EXAM: PORTABLE CHEST 1 VIEW COMPARISON:  01/28/2023. FINDINGS: There are left retrocardiac airspace opacities obscuring the left hemidiaphragm, descending thoracic aorta and blunting the left lateral costophrenic angle suggesting combination of left lung atelectasis and/or consolidation with pleural effusion. There are also probable atelectatic changes at the right lung base. Bilateral lung fields are otherwise clear. Right lateral costophrenic angle is clear. Stable cardio-mediastinal silhouette. Sternotomy wires noted. No acute osseous abnormalities. The soft tissues are within normal limits. IMPRESSION: *Left retrocardiac opacity,  as discussed above. Follow-up to clearing is recommended. Electronically Signed   By: Jules Schick M.D.   On: 02/25/2023 16:40    Procedures Procedures    Medications Ordered in ED Medications  metoprolol succinate (TOPROL-XL) 24 hr tablet 100 mg (has no administration in time range)  rosuvastatin (CRESTOR) tablet 40 mg (has no administration in time range)  DULoxetine (CYMBALTA) DR capsule 60 mg (has no administration in time range)  tamsulosin (FLOMAX) capsule 0.4 mg (has no administration in time range)  apixaban (ELIQUIS) tablet 2.5 mg (2.5 mg Oral Given 02/25/23 2307)  gabapentin (NEURONTIN) capsule 100 mg (100 mg Oral Given 02/25/23 2307)  acetaminophen (TYLENOL) tablet 650 mg (has no administration in time range)    Or  acetaminophen (TYLENOL) suppository 650 mg (has no administration in time range)  oxyCODONE (Oxy IR/ROXICODONE) immediate release tablet 5 mg (5 mg Oral Given 02/25/23 2307)  ondansetron (ZOFRAN) tablet 4 mg (has no administration in time range)    Or  ondansetron (ZOFRAN) injection 4 mg (has no administration in time range)  ipratropium-albuterol (DUONEB) 0.5-2.5 (3)  MG/3ML nebulizer solution 3 mL (3 mLs Nebulization Given 02/25/23 2240)  cefTRIAXone (ROCEPHIN) 2 g in sodium chloride 0.9 % 100 mL IVPB (has no administration in time range)  azithromycin (ZITHROMAX) tablet 500 mg (has no administration in time range)  insulin aspart (novoLOG) injection 0-5 Units ( Subcutaneous Not Given 02/25/23 2330)  insulin aspart (novoLOG) injection 0-15 Units (has no administration in time range)  Oral care mouth rinse (has no administration in time range)  iohexol (OMNIPAQUE) 350 MG/ML injection 60 mL (60 mLs Intravenous Contrast Given 02/25/23 1759)  ipratropium-albuterol (DUONEB) 0.5-2.5 (3) MG/3ML nebulizer solution 3 mL (3 mLs Nebulization Given 02/25/23 1825)  cefTRIAXone (ROCEPHIN) 1 g in sodium chloride 0.9 % 100 mL IVPB (0 g Intravenous Stopped 02/25/23 2043)  azithromycin (ZITHROMAX) 500 mg in dextrose 5 % 250 mL IVPB (0 mg Intravenous Stopped 02/25/23 2203)  furosemide (LASIX) injection 20 mg (20 mg Intravenous Given 02/25/23 2113)    ED Course/ Medical Decision Making/ A&P Clinical Course as of 02/25/23 2348  Thu Feb 25, 2023  1600 Per chart review admitted on 10/1 for new onset afib. Further review shows PMH to include :"88 y.o. male with medical history significant for CAD, COPD on room air, hypertension, depression, type 2 diabetes insulin-dependent being admitted to the hospital with new onset atrial fibrillation RVR.  Patient has a history of prostate cancer and multiple cystoscopies with urethral dilation" [TY]  1601 Echo done 01/27/23:  "1. Left ventricular ejection fraction, by estimation, is 40 to 45%. The  left ventricle has mildly decreased function. The left ventricle  demonstrates global hypokinesis. Left ventricular diastolic parameters are  indeterminate. There is severe  hypokinesis of the left ventricular, basal-mid inferoseptal wall, inferior  wall and inferolateral wall.   2. Right ventricular systolic function is mildly reduced. The right   ventricular size is normal. There is normal pulmonary artery systolic  pressure.   3. Left atrial size was mildly dilated.   4. Right atrial size was mildly dilated.   5. The mitral valve is degenerative. Trivial mitral valve regurgitation.  No evidence of mitral stenosis.   6. The aortic valve is tricuspid. Aortic valve regurgitation is mild. No  aortic stenosis is present.   7. Aortic dilatation noted. There is dilatation of the aortic root,  measuring 38 mm. There is dilatation of the ascending aorta, measuring 40  mm.  "  [TY]  [id number] Per cardiology note 10/4 while inpatient: "Shortness of breath :   Mildly reduced EF.  I am not sure of his baseline.  I gave him some PO diuretic yesterday.  Creat did go up.  We need to ambulate on RA and see what his sats do.  It looks like he was taking 40 mg of Lasix daily at home.  I think he will need some baseline Lasix so I would suggest 20 mg daily at discharge.  Other meds as on MAR.  Follow up has been arranged for next week for [facility] care.    " [TY]  1643 Procedure on 10/15: PROCEDURE:  Procedure(s): 1. TRANSURETHRAL RESECTION OF BLADDER TUMOR (TURBT) (>5cm.) 2. Urethral dilation  [TY]  1644 DG Chest Portable 1 View FINDINGS: There are left retrocardiac airspace opacities obscuring the left hemidiaphragm, descending thoracic aorta and blunting the left lateral costophrenic angle suggesting combination of left lung atelectasis and/or consolidation with pleural effusion. There are also probable atelectatic changes at the right lung base. Bilateral lung fields are otherwise clear. Right lateral costophrenic angle is clear.  Stable cardio-mediastinal silhouette.  Sternotomy wires noted.  No acute osseous abnormalities.  The soft tissues are within normal limits.  IMPRESSION: *Left retrocardiac opacity, as discussed above. Follow-up to clearing is recommended.   [TY]  1939 CT ABDOMEN PELVIS W CONTRAST IMPRESSION: 1. No acute  findings. 2. Left lower quadrant parastomal hernia containing a loop of small bowel, without obstruction or strangulation. 3. 3.3 cm infrarenal saccular aortic aneurysm. Recommend referral to or continued care with vascular specialist. (Ref.: J Vasc Surg. 2018; 67:2-77 and J Am Coll Radiol 2013;10(10):789-794.) 4. 1.3 cm enhancing lesion in hepatic segment 5, possible FNH but nonspecific. MR liver with contrast may allow definitive characterization. 5. Small left pleural effusion with adjacent atelectasis/consolidation at the left lung base, new since previous. 6.  Aortic Atherosclerosis (ICD10-I70.0).   [TY]    Clinical Course User Index [TY] Coral Spikes, DO                                 Medical Decision Making 88 year old male with complex past medical history as noted in the ED course presenting emergency department with chest pain/shortness of breath.  He is afebrile nontachycardic.  Was on oxygen on my initial evaluation, transition to room air with no desaturation.  Did ambulate to bathroom and did have increased work of breathing however.  Mild edema to lower extremities on physical exam.  Has some coarse breath sounds, possible faint wheezes.  DuoNeb ordered as he does have history of COPD.  Per chart review recently admitted for new onset A-fib echo with mildly reduced ejection fraction.  Questionable on dose of Lasix patient should be on as per cardiology's last note he was on 40, but seemingly discharged on 20mg .  He appears to be in rate controlled atrial flutter.  Does have a mildly elevated troponin at 22.  No significant electrolyte abnormalities.  Creatinine appears to be at his baseline.  No leukocytosis fever or tachycardia to suggest systemic infection.  It also appears that he just wondered underwent urology procedure as noted in ED course and now complaining abdominal pain.  Will get CT scan to evaluate for acute intra-abdominal pathology.  Suspect symptoms  multifactorial as chest x-ray appears to have atelectasis versus pneumonia with underlying COPD.   Amount and/or Complexity of Data Reviewed Labs: ordered. Radiology: ordered. Decision-making  details documented in ED Course.  Risk Prescription drug management. Decision regarding hospitalization. Risk Details: Will admit given patient's advanced age and comorbid medical conditions with high risk of decompensation given his presenting symptoms.         Final Clinical Impression(s) / ED Diagnoses Final diagnoses:  Infrarenal abdominal aortic aneurysm (AAA) without rupture (HCC)  Shortness of breath  Pneumonia of left lower lobe due to infectious organism    Rx / DC Orders ED Discharge Orders     None         Coral Spikes, DO 02/25/23 2348

## 2023-02-25 NOTE — ED Notes (Signed)
ED TO INPATIENT HANDOFF REPORT  ED Nurse Name and Phone #: Rodney Booze [phone]  S Name/Age/Gender Eddie Graham 88 y.o. male Room/Bed: 002C/002C  Code Status   Code Status: Full Code  Home/SNF/Other Home Patient oriented to: self, place, time, and situation Is this baseline? Yes   Triage Complete: Triage complete  Chief Complaint Community acquired bacterial pneumonia [J15.9]  Triage Note Pt from home via [facility] with reports of SHOB, abdominal swelling, and bilateral leg swelling. Pt received 2 nitroglycerin and 324mg  ASA.   Allergies No Known Allergies  Level of Care/Admitting Diagnosis ED Disposition     ED Disposition  Admit   Condition  --   Comment  Hospital Area: [facility] [[id number]]  Level of Care: Telemetry Medical [104]  May admit patient to [facility] or [facility] if equivalent level of care is available:: No  Covid Evaluation: Asymptomatic - no recent exposure (last 10 days) testing not required  Diagnosis: Community acquired bacterial pneumonia [284132]  Admitting Physician: Alan Mulder [[id number]]  Attending Physician: Alan Mulder [[id number]]  Certification:: I certify this patient will need inpatient services for at least 2 midnights  Expected Medical Readiness: 02/27/2023          B Medical/Surgery History Past Medical History:  Diagnosis Date   AAA (abdominal aortic aneurysm) (HCC)    Anemia    Anxiety    Arthritis    Asthma    Back pain    Cancer (HCC)    Chronic kidney disease    COPD (chronic obstructive pulmonary disease) (HCC)    Depression    Diabetes mellitus without complication (HCC)    Dyspnea    Hypertension    Insomnia    Past Surgical History:  Procedure Laterality Date   BOWEL RESECTION     CARDIAC SURGERY     CYSTOSCOPY WITH URETHRAL DILATATION N/A 09/30/2021   Procedure: CYSTOSCOPY WITH  URETHRAL BALLOON  DILATATION;  Surgeon: Noel Christmas, MD;  Location: [facility];  Service:  Urology;  Laterality: N/A;   CYSTOSCOPY WITH URETHRAL DILATATION N/A 12/09/2021   Procedure: CYSTOSCOPY WITH URETHRAL DILATATION;  Surgeon: Noel Christmas, MD;  Location: [facility];  Service: Urology;  Laterality: N/A;  1 HR   CYSTOSCOPY WITH URETHRAL DILATATION N/A 02/09/2023   Procedure: CYSTOSCOPY WITH URETHRAL DILATATION;  Surgeon: Noel Christmas, MD;  Location: [facility];  Service: Urology;  Laterality: N/A;   PROSTATE SURGERY     TRANSURETHRAL RESECTION OF BLADDER TUMOR N/A 09/30/2021   Procedure: TRANSURETHRAL RESECTION OF BLADDER TUMOR (TURBT);  Surgeon: Noel Christmas, MD;  Location: [facility];  Service: Urology;  Laterality: N/A;  90 MINS   TRANSURETHRAL RESECTION OF BLADDER TUMOR N/A 02/09/2023   Procedure: TRANSURETHRAL RESECTION OF BLADDER TUMOR (TURBT);  Surgeon: Noel Christmas, MD;  Location: [facility];  Service: Urology;  Laterality: N/A;     A IV Location/Drains/Wounds Patient Lines/Drains/Airways Status     Active Line/Drains/Airways     Name Placement date Placement time Site Days   Peripheral IV 02/25/23 20 G Left Antecubital 02/25/23  1457  Antecubital  less than 1   Colostomy LLQ 06/13/22  1026  LLQ  257   Urethral Catheter Dr. Arita Miss 20 Fr. 02/09/23  1716  --  16            Intake/Output Last 24 hours  Intake/Output Summary (Last 24 hours) at 02/25/2023 2141 Last data filed at 02/25/2023 2043 Gross per 24 hour  Intake 100.12 ml  Output --  Net 100.12 ml    Labs/Imaging Results for orders placed or performed during the hospital encounter of 02/25/23 (from the past 48 hour(s))  Basic metabolic panel     Status: Abnormal   Collection Time: 02/25/23  2:44 PM  Result Value Ref Range   Sodium 138 135 - 145 mmol/L   Potassium 4.0 3.5 - 5.1 mmol/L   Chloride 105 98 - 111 mmol/L   CO2 23 22 - 32 mmol/L   Glucose, Bld 62 (L) 70 - 99 mg/dL    Comment: Glucose reference range applies only to samples taken after fasting for at least 8 hours.   BUN 17 8 - 23  mg/dL   Creatinine, Ser 5.36 (H) 0.61 - 1.24 mg/dL   Calcium 8.8 (L) 8.9 - 10.3 mg/dL   GFR, Estimated 41 (L) >60 mL/min    Comment: (NOTE) Calculated using the CKD-EPI Creatinine Equation (2021)    Anion gap 10 5 - 15    Comment: Performed at [facility], [address] [place], [place] [address]  CBC     Status: None   Collection Time: 02/25/23  2:44 PM  Result Value Ref Range   WBC 7.7 4.0 - 10.5 K/uL   RBC 4.45 4.22 - 5.81 MIL/uL   Hemoglobin 13.1 13.0 - 17.0 g/dL   HCT 47.4 25.9 - 56.3 %   MCV 92.1 80.0 - 100.0 fL   MCH 29.4 26.0 - 34.0 pg   MCHC 32.0 30.0 - 36.0 g/dL   RDW 87.5 64.3 - 32.9 %   Platelets 230 150 - 400 K/uL   nRBC 0.0 0.0 - 0.2 %    Comment: Performed at [facility], [address], [place], [place] [address]  Troponin I (High Sensitivity)     Status: Abnormal   Collection Time: 02/25/23  2:44 PM  Result Value Ref Range   Troponin I (High Sensitivity) 22 (H) <18 ng/L    Comment: (NOTE) Elevated high sensitivity troponin I (hsTnI) values and significant  changes across serial measurements may suggest ACS but many other  chronic and acute conditions are known to elevate hsTnI results.  Refer to the "Links" section for chest pain algorithms and additional  guidance. Performed at [facility], [address] [place], [place] [address]   Brain natriuretic peptide     Status: Abnormal   Collection Time: 02/25/23  5:20 PM  Result Value Ref Range   B Natriuretic Peptide 252.8 (H) 0.0 - 100.0 pg/mL    Comment: Performed at [facility], [address] [place], [place] [address]  Troponin I (High Sensitivity)     Status: Abnormal   Collection Time: 02/25/23  5:20 PM  Result Value Ref Range   Troponin I (High Sensitivity) 23 (H) <18 ng/L    Comment: (NOTE) Elevated high sensitivity troponin I (hsTnI) values and significant  changes across serial measurements may suggest ACS but many other  chronic and acute conditions are known to  elevate hsTnI results.  Refer to the "Links" section for chest pain algorithms and additional  guidance. Performed at [facility], [address] [place], [place] [address]    CT ABDOMEN PELVIS W CONTRAST  Result Date: 02/25/2023 CLINICAL DATA:  Abdominal swelling, suspected bowel obstruction EXAM: CT ABDOMEN AND PELVIS WITH CONTRAST TECHNIQUE: Multidetector CT imaging of the abdomen and pelvis was performed using the standard protocol following bolus administration of intravenous contrast. RADIATION DOSE  REDUCTION: This exam was performed according to the departmental dose-optimization program which includes automated exposure control, adjustment of the mA and/or kV according to patient size and/or use of iterative reconstruction technique. CONTRAST:  60mL OMNIPAQUE IOHEXOL 350 MG/ML SOLN COMPARISON:  07/18/2022 FINDINGS: Lower chest: Small left pleural effusion with adjacent atelectasis/consolidation at the left lung base, new since previous. Coronary and aortic calcifications. Hepatobiliary: 1.3 cm enhancing lesion in hepatic segment 5, inconspicuous on the delayed scan and not evident on prior study which had slightly different contrast timing as well. No biliary ductal dilatation. Gallbladder normal. Pancreas: Unremarkable. No pancreatic ductal dilatation or surrounding inflammatory changes. Spleen: Normal in size without focal abnormality. Adrenals/Urinary Tract: No adrenal mass. No hydronephrosis. Stable 1.6 cm 16 HU probable cyst right lower pole; no follow-up recommended. Urinary bladder nondistended. Stomach/Bowel: Stomach incompletely distended, without acute finding. Small bowel decompressed. A loop of small bowel is involved in a left lower quadrant parastomal her hernia, without obstruction or strangulation. Normal appendix. Multiple colonic diverticula, without adjacent inflammatory change. Left lower quadrant colostomy. Decompressed Hartmann's pouch. Vascular/Lymphatic: Extensive  calcified aortoiliac atheromatous plaque. Saccular aneurysm from the right posterolateral aspect of the infrarenal aorta, up to 3.3 cm diameter (previously 3.1). Left portal vein patent. No abdominal or pelvic adenopathy. Reproductive: Prostate is unremarkable. Other: No ascites.  No free air. Musculoskeletal: Sternotomy wires. Mild lumbar levoscoliosis, with mild multilevel spondylitic change most marked L5-S1. IMPRESSION: 1. No acute findings. 2. Left lower quadrant parastomal hernia containing a loop of small bowel, without obstruction or strangulation. 3. 3.3 cm infrarenal saccular aortic aneurysm. Recommend referral to or continued care with vascular specialist. (Ref.: J Vasc Surg. 2018; 67:2-77 and J Am Coll Radiol 2013;10(10):789-794.) 4. 1.3 cm enhancing lesion in hepatic segment 5, possible FNH but nonspecific. MR liver with contrast may allow definitive characterization. 5. Small left pleural effusion with adjacent atelectasis/consolidation at the left lung base, new since previous. 6.  Aortic Atherosclerosis (ICD10-I70.0). Electronically Signed   By: Corlis Leak M.D.   On: 02/25/2023 19:27   DG Chest Portable 1 View  Result Date: 02/25/2023 CLINICAL DATA:  Chest pain.  Shortness of breath. EXAM: PORTABLE CHEST 1 VIEW COMPARISON:  01/28/2023. FINDINGS: There are left retrocardiac airspace opacities obscuring the left hemidiaphragm, descending thoracic aorta and blunting the left lateral costophrenic angle suggesting combination of left lung atelectasis and/or consolidation with pleural effusion. There are also probable atelectatic changes at the right lung base. Bilateral lung fields are otherwise clear. Right lateral costophrenic angle is clear. Stable cardio-mediastinal silhouette. Sternotomy wires noted. No acute osseous abnormalities. The soft tissues are within normal limits. IMPRESSION: *Left retrocardiac opacity, as discussed above. Follow-up to clearing is recommended. Electronically Signed    By: Jules Schick M.D.   On: 02/25/2023 16:40    Pending Labs Unresulted Labs (From admission, onward)     Start     Ordered   02/26/23 0500  Basic metabolic panel  Tomorrow morning,   R        02/25/23 2058   02/26/23 0500  CBC  Tomorrow morning,   R        02/25/23 2058            Vitals/Pain Today's Vitals   02/25/23 1645 02/25/23 1720 02/25/23 1819 02/25/23 1948  BP: 126/77 125/73  (!) 149/87  Pulse:    (!) 59  Resp: 17   19  Temp:    98.1 F (36.7 C)  TempSrc:    Oral  SpO2:  95%  Weight:   98.1 kg   Height:   5\' 6"  (1.676 m)   PainSc:        Isolation Precautions No active isolations  Medications Medications  azithromycin (ZITHROMAX) 500 mg in dextrose 5 % 250 mL IVPB (500 mg Intravenous New Bag/Given 02/25/23 2042)  metoprolol succinate (TOPROL-XL) 24 hr tablet 100 mg (has no administration in time range)  rosuvastatin (CRESTOR) tablet 40 mg (has no administration in time range)  DULoxetine (CYMBALTA) DR capsule 30 mg (has no administration in time range)  tamsulosin (FLOMAX) capsule 0.4 mg (has no administration in time range)  apixaban (ELIQUIS) tablet 2.5 mg (has no administration in time range)  gabapentin (NEURONTIN) capsule 100 mg (has no administration in time range)  acetaminophen (TYLENOL) tablet 650 mg (has no administration in time range)    Or  acetaminophen (TYLENOL) suppository 650 mg (has no administration in time range)  oxyCODONE (Oxy IR/ROXICODONE) immediate release tablet 5 mg (has no administration in time range)  ondansetron (ZOFRAN) tablet 4 mg (has no administration in time range)    Or  ondansetron (ZOFRAN) injection 4 mg (has no administration in time range)  ipratropium-albuterol (DUONEB) 0.5-2.5 (3) MG/3ML nebulizer solution 3 mL (has no administration in time range)  cefTRIAXone (ROCEPHIN) 2 g in sodium chloride 0.9 % 100 mL IVPB (has no administration in time range)  azithromycin (ZITHROMAX) tablet 500 mg (has no  administration in time range)  iohexol (OMNIPAQUE) 350 MG/ML injection 60 mL (60 mLs Intravenous Contrast Given 02/25/23 1759)  ipratropium-albuterol (DUONEB) 0.5-2.5 (3) MG/3ML nebulizer solution 3 mL (3 mLs Nebulization Given 02/25/23 1825)  cefTRIAXone (ROCEPHIN) 1 g in sodium chloride 0.9 % 100 mL IVPB (0 g Intravenous Stopped 02/25/23 2043)  furosemide (LASIX) injection 20 mg (20 mg Intravenous Given 02/25/23 2113)    Mobility walks     Focused Assessments Cardiac Assessment Handoff:    No results found for: "CKTOTAL", "CKMB", "CKMBINDEX", "TROPONINI" No results found for: "DDIMER" Does the Patient currently have chest pain? No    R Recommendations: See Admitting Provider Note  Report given to:   Additional Notes:

## 2023-02-26 DIAGNOSIS — I5023 Acute on chronic systolic (congestive) heart failure: Secondary | ICD-10-CM

## 2023-02-26 DIAGNOSIS — J159 Unspecified bacterial pneumonia: Secondary | ICD-10-CM | POA: Diagnosis not present

## 2023-02-26 LAB — CBC
HCT: 42.9 % (ref 39.0–52.0)
Hemoglobin: 13.6 g/dL (ref 13.0–17.0)
MCH: 28.9 pg (ref 26.0–34.0)
MCHC: 31.7 g/dL (ref 30.0–36.0)
MCV: 91.3 fL (ref 80.0–100.0)
Platelets: 234 10*3/uL (ref 150–400)
RBC: 4.7 MIL/uL (ref 4.22–5.81)
RDW: 15.2 % (ref 11.5–15.5)
WBC: 8.3 10*3/uL (ref 4.0–10.5)
nRBC: 0 % (ref 0.0–0.2)

## 2023-02-26 LAB — BASIC METABOLIC PANEL
Anion gap: 13 (ref 5–15)
BUN: 17 mg/dL (ref 8–23)
CO2: 24 mmol/L (ref 22–32)
Calcium: 8.6 mg/dL — ABNORMAL LOW (ref 8.9–10.3)
Chloride: 100 mmol/L (ref 98–111)
Creatinine, Ser: 1.69 mg/dL — ABNORMAL HIGH (ref 0.61–1.24)
GFR, Estimated: 39 mL/min — ABNORMAL LOW (ref >60)
Glucose, Bld: 90 mg/dL (ref 70–99)
Potassium: 4 mmol/L (ref 3.5–5.1)
Sodium: 137 mmol/L (ref 135–145)

## 2023-02-26 LAB — PROCALCITONIN: Procalcitonin: <0.1 ng/mL

## 2023-02-26 LAB — GLUCOSE, CAPILLARY
Glucose-Capillary: 90 mg/dL (ref 70–99)
Glucose-Capillary: 148 mg/dL — ABNORMAL HIGH (ref 70–99)
Glucose-Capillary: 131 mg/dL — ABNORMAL HIGH (ref 70–99)

## 2023-02-26 MED ORDER — UMECLIDINIUM-VILANTEROL 62.5-25 MCG/ACT IN AEPB
1.0000 | INHALATION_SPRAY | Freq: Every day | RESPIRATORY_TRACT | Status: DC
Start: 2023-02-26 — End: 2023-03-01
  Administered 2023-02-28 – 2023-03-01 (×2): 1 via RESPIRATORY_TRACT
  Filled 2023-02-26 (×3): qty 14

## 2023-02-26 MED ORDER — IPRATROPIUM-ALBUTEROL 0.5-2.5 (3) MG/3ML IN SOLN: 3.0000 mL | RESPIRATORY_TRACT | Status: DC | PRN

## 2023-02-26 MED ORDER — MONTELUKAST SODIUM 10 MG PO TABS
10.0000 mg | ORAL_TABLET | Freq: Every day | ORAL | Status: DC
  Administered 2023-02-26 – 2023-03-01 (×4): 10 mg via ORAL
  Filled 2023-02-26 (×4): qty 1

## 2023-02-26 MED ORDER — FUROSEMIDE 10 MG/ML IJ SOLN
20.0000 mg | Freq: Two times a day (BID) | INTRAMUSCULAR | Status: DC
  Administered 2023-02-26 (×2): 20 mg via INTRAVENOUS
  Filled 2023-02-26 (×2): qty 2

## 2023-02-26 MED ORDER — FUROSEMIDE 10 MG/ML IJ SOLN
20.0000 mg | Freq: Once | INTRAMUSCULAR | Status: AC
  Administered 2023-02-26: 20 mg via INTRAVENOUS
  Filled 2023-02-26: qty 2

## 2023-02-26 MED ORDER — INSULIN ASPART 100 UNIT/ML IJ SOLN
0.0000 [IU] | Freq: Three times a day (TID) | INTRAMUSCULAR | Status: DC
  Administered 2023-02-26: 1 [IU] via SUBCUTANEOUS
  Administered 2023-02-27: 2 [IU] via SUBCUTANEOUS
  Administered 2023-02-27: 3 [IU] via SUBCUTANEOUS
  Administered 2023-02-27: 1 [IU] via SUBCUTANEOUS
  Administered 2023-02-28 – 2023-03-01 (×5): 2 [IU] via SUBCUTANEOUS

## 2023-02-26 MED ORDER — AZITHROMYCIN 250 MG PO TABS
500.0000 mg | ORAL_TABLET | Freq: Every day | ORAL | Status: AC
  Administered 2023-02-26 – 2023-02-27 (×2): 500 mg via ORAL
  Filled 2023-02-26 (×2): qty 2

## 2023-02-26 MED ORDER — SODIUM CHLORIDE 0.9 % IV SOLN
2.0000 g | Freq: Two times a day (BID) | INTRAVENOUS | Status: DC
  Administered 2023-02-26 (×2): 2 g via INTRAVENOUS
  Filled 2023-02-26 (×2): qty 12.5

## 2023-02-26 MED ORDER — VANCOMYCIN HCL 750 MG/150ML IV SOLN
750.0000 mg | INTRAVENOUS | Status: DC
  Filled 2023-02-26: qty 150

## 2023-02-26 MED ORDER — SODIUM CHLORIDE 0.9 % IV SOLN
1.0000 g | INTRAVENOUS | Status: DC
  Administered 2023-02-26 – 2023-02-27 (×2): 1 g via INTRAVENOUS
  Filled 2023-02-26 (×2): qty 10

## 2023-02-26 MED ORDER — VANCOMYCIN HCL 1500 MG/300ML IV SOLN
1500.0000 mg | Freq: Once | INTRAVENOUS | Status: AC
  Administered 2023-02-26: 1500 mg via INTRAVENOUS
  Filled 2023-02-26: qty 300

## 2023-02-26 NOTE — Progress Notes (Signed)
PROGRESS NOTE  Eddie Graham WUJ:[id number] DOB: 09-Aug-1934   PCP: Andreas Blower., MD  Patient is from: Home.  Independently ambulates at baseline.  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  He has chronic abdominal pain since his colectomy and colostomy.  Recently hospitalized from 10/1-10/4 for A-fib/flutter with RVR, chest and abdominal pain.  He underwent TURP bed on 10/15 outpatient.   In ED, febrile.  No leukocytosis.  BNP elevated to 252.  Troponin 22>> 23.  CXR raise concern for left retrocardiac opacity and blunting of left lateral costophrenic angle.  CT abdomen and pelvis without acute finding but 3 cm infrarenal AAA and 1.3 cm enhancing lesion in his liver.  Patient was started on IV Lasix as well as broad-spectrum antibiotics for pneumonia due to his recent hospitalization   Subjective: Seen and examined earlier this morning with the help of video interpreter.  No major events overnight of this morning.  Not a great historian.  He reports diffuse abdominal pain since his abdominal surgery for colostomy.  Pain is unchanged from baseline.  However, he feels some radiation into his left chest which makes it hard for him to take a deep breath.  Reports some cough.   Objective: Vitals:   02/26/23 0603 02/26/23 0730 02/26/23 0741 02/26/23 1058  BP: 112/67  (!) 158/80 136/83  Pulse: 97  96 (!) 106  Resp: 19  19 20   Temp: 98.9 F (37.2 C)  98.8 F (37.1 C) 98.8 F (37.1 C)  TempSrc: Oral  Oral Oral  SpO2: 96% 96% 94% 94%   Weight: 94.1 kg     Height:        Examination:  GENERAL: No apparent distress.  Nontoxic. HEENT: MMM.  Vision and hearing grossly intact.  NECK: Supple.  No apparent JVD.  RESP:  No IWOB.  Fair aeration bilaterally. CVS:  RRR. Heart sounds normal.  ABD/GI/GU: BS+. Abd soft.  Mild diffuse tenderness.  No rebound.  Colostomy bag MSK/EXT:  Moves extremities. No apparent deformity. No edema.  SKIN: no apparent skin lesion or wound NEURO: Awake, alert and oriented appropriately.  No apparent focal neuro deficit. PSYCH: Calm. Normal affect.   Procedures:  None  Microbiology summarized: MRSA PCR screen pending  Assessment and plan: Acute hypoxic respiratory failure ruled out -Wean off oxygen.  Ambulatory saturation, incentive spirometry and OOB  Possible left lung pneumonia: Patient presents with shortness of breath, edema and chronic abdominal pain.  He has no fever or leukocytosis.  Pro-Cal negative.  Chest x-ray raises CXR raise concern for left retrocardiac opacity and blunting of left lateral costophrenic angle.  He has history of CHF but appears euvolemic on exam.  BNP is only 252.  Started on broad-spectrum antibiotics due to recent hospitalization. -Check MRSA PCR.  Will discontinue vancomycin if negative -Change cefepime to ceftriaxone.  Low suspicion for pseudomonal infection -Resume Zithromax for atypical coverage -Incentive spirometry, ambulatory saturation, nebulizers -Continue IV Lasix  Acute on chronic HFmrEF: TTE on 10/2 with LVEF of 40 to 45%, global hypokinesis and indeterminate DD.  On p.o.  Lasix 20 mg daily at home.  Presented with shortness of breath and edema.  BNP slightly elevated to 252.  Received IV Lasix 20 mg x 1 in ED. Appears euvolemic on exam except for trace bilateral edema.  -Continue IV Lasix 20 mg twice daily -Strict intake and output, daily weight, renal functions and electrolytes  Atrial flutter: Rate controlled.  On metoprolol and Eliquis at  home. -Continue home meds -Optimize electrolytes  Uncontrolled IDDM-2 with hyperglycemia, neuropathy and CK-3B: A1c 7.4% on 9/25. Recent Labs  Lab 02/25/23 2302 02/26/23 0605  GLUCAP 136* 90  -Decrease SSI to sensitive -Continue gabapentin  CKD-3B: Stable Recent Labs    07/18/22 0007 07/18/22 0031 01/20/23 1522 01/26/23 1253 01/27/23 0437 01/28/23 0522 01/29/23 0453 02/03/23 1643 02/25/23 1444 02/26/23 0427  BUN 37* 31* 29* 33* 37* 39* 40* 38* 17 17  CREATININE 1.46* 1.60* 1.56* 1.94* 1.91* 1.71* 2.02* 1.90* 1.61* 1.69*  -Continue monitoring  Chronic COPD: Stable -Continue inhalers/as needed nebs  History of siogmoid abscess s/p end colostomy -Colostomy care  History of bladder cancer s/p TURB on 10/15 -Outpatient follow-up   Elevated troponins: Mild without significant delta. -No further workup needed   Hepatic lesion: CT abdomen and pelvis showed 1.3 cm hepatic lesion.  Discussed with patient. -Needs MRI outpatient   Infrarenal AAA-3 cm on CT -outpatient follow-up   Chronic abdominal pain:  going on since colostomy.  Denies change.  CT abdomen and pelvis without acute finding. -Continue home meds   Hyperlipidemia -Continue Crestor   BPH -continue Flomax   Obesity Body mass index is 33.49 kg/m.           DVT prophylaxis:  apixaban (ELIQUIS) tablet 2.5 mg Start: 02/25/23 2200 SCDs Start: 02/25/23 2053 apixaban (ELIQUIS) tablet 2.5 mg  Code Status: Full code Family Communication: None at bedside Level of care: Telemetry Medical Status is: Inpatient Remains inpatient appropriate because: Pneumonia and CHF   Final disposition: Home once medically ready Consultants:  None  55 minutes with more than 50% spent in reviewing records, counseling patient/family and coordinating care.   Sch Meds:  Scheduled Meds:  apixaban  2.5 mg Oral BID   DULoxetine  60 mg Oral Daily   gabapentin  100 mg Oral TID   insulin aspart  0-5 Units Subcutaneous  QHS   insulin aspart  0-9 Units Subcutaneous TID WC   ipratropium-albuterol  3 mL Nebulization Q6H   metoprolol succinate  100 mg Oral Daily   rosuvastatin  40 mg Oral Daily   tamsulosin  0.4 mg Oral Daily   Continuous Infusions:  ceFEPime (MAXIPIME) IV 2 g (02/26/23 0950)   [START ON 02/27/2023] vancomycin     PRN Meds:.acetaminophen **OR** acetaminophen, ondansetron **OR** ondansetron (ZOFRAN) IV, mouth rinse, oxyCODONE  Antimicrobials: Anti-infectives (From admission, onward)    Start     Dose/Rate Route Frequency Ordered Stop   02/27/23 1000  vancomycin (VANCOREADY) IVPB 750 mg/150 mL        750 mg 150 mL/hr over 60 Minutes Intravenous Every 24 hours 02/26/23 0144     02/26/23 0900  cefTRIAXone (ROCEPHIN) 2 g in sodium chloride 0.9 % 100 mL IVPB  Status:  Discontinued        2 g 200 mL/hr over 30 Minutes Intravenous Every 24 hours 02/25/23 2102 02/26/23 0132   02/26/23 0900  azithromycin (ZITHROMAX) tablet 500 mg  Status:  Discontinued        500 mg Oral Daily 02/25/23 2103 02/26/23 0132   02/26/23  0230  vancomycin (VANCOREADY) IVPB 1500 mg/300 mL        1,500 mg 150 mL/hr over 120 Minutes Intravenous  Once 02/26/23 0144 02/26/23 0548   02/26/23 0230  ceFEPIme (MAXIPIME) 2 g in sodium chloride 0.9 % 100 mL IVPB        2 g 200 mL/hr over 30 Minutes Intravenous 2 times daily 02/26/23 0144     02/25/23 2000  cefTRIAXone (ROCEPHIN) 1 g in sodium chloride 0.9 % 100 mL IVPB        1 g 200 mL/hr over 30 Minutes Intravenous  Once 02/25/23 1952 02/25/23 2043   02/25/23 2000  azithromycin (ZITHROMAX) 500 mg in dextrose 5 % 250 mL IVPB        500 mg 250 mL/hr over 60 Minutes Intravenous  Once 02/25/23 1952 02/25/23 2203        I have personally reviewed the following labs and images: CBC: Recent Labs  Lab 02/25/23 1444 02/26/23 0427  WBC 7.7 8.3  HGB 13.1 13.6  HCT 41.0 42.9  MCV 92.1 91.3  PLT 230 234   BMP &GFR Recent Labs  Lab 02/25/23 1444 02/26/23 0427  NA 138  137  K 4.0 4.0  CL 105 100  CO2 23 24  GLUCOSE 62* 90  BUN 17 17  CREATININE 1.61* 1.69*  CALCIUM 8.8* 8.6*   Estimated Creatinine Clearance: 33.7 mL/min (A) (by C-G formula based on SCr of 1.69 mg/dL (H)). Liver & Pancreas: No results for input(s): "AST", "ALT", "ALKPHOS", "BILITOT", "PROT", "ALBUMIN" in the last 168 hours. No results for input(s): "LIPASE", "AMYLASE" in the last 168 hours. No results for input(s): "AMMONIA" in the last 168 hours. Diabetic: No results for input(s): "HGBA1C" in the last 72 hours. Recent Labs  Lab 02/25/23 2302 02/26/23 0605  GLUCAP 136* 90   Cardiac Enzymes: No results for input(s): "CKTOTAL", "CKMB", "CKMBINDEX", "TROPONINI" in the last 168 hours. No results for input(s): "PROBNP" in the last 8760 hours. Coagulation Profile: No results for input(s): "INR", "PROTIME" in the last 168 hours. Thyroid Function Tests: No results for input(s): "TSH", "T4TOTAL", "FREET4", "T3FREE", "THYROIDAB" in the last 72 hours. Lipid Profile: No results for input(s): "CHOL", "HDL", "LDLCALC", "TRIG", "CHOLHDL", "LDLDIRECT" in the last 72 hours. Anemia Panel: No results for input(s): "VITAMINB12", "FOLATE", "FERRITIN", "TIBC", "IRON", "RETICCTPCT" in the last 72 hours. Urine analysis:    Component Value Date/Time   COLORURINE STRAW (A) 07/18/2022 0226   APPEARANCEUR CLEAR 07/18/2022 0226   LABSPEC 1.036 (H) 07/18/2022 0226   PHURINE 6.0 07/18/2022 0226   GLUCOSEU NEGATIVE 07/18/2022 0226   HGBUR NEGATIVE 07/18/2022 0226   BILIRUBINUR NEGATIVE 07/18/2022 0226   KETONESUR NEGATIVE 07/18/2022 0226   PROTEINUR NEGATIVE 07/18/2022 0226   NITRITE NEGATIVE 07/18/2022 0226   LEUKOCYTESUR NEGATIVE 07/18/2022 0226   Sepsis Labs: Invalid input(s): "PROCALCITONIN", "LACTICIDVEN"  Microbiology: No results found for this or any previous visit (from the past 240 hour(s)).  Radiology Studies: CT ABDOMEN PELVIS W CONTRAST  Result Date: 02/25/2023 CLINICAL DATA:   Abdominal swelling, suspected bowel obstruction EXAM: CT ABDOMEN AND PELVIS WITH CONTRAST TECHNIQUE: Multidetector CT imaging of the abdomen and pelvis was performed using the standard protocol following bolus administration of intravenous contrast. RADIATION DOSE REDUCTION: This exam was performed according to the departmental dose-optimization program which includes automated exposure control, adjustment of the mA and/or kV according to patient size and/or use of iterative reconstruction technique. CONTRAST:  60mL OMNIPAQUE IOHEXOL 350 MG/ML SOLN COMPARISON:  07/18/2022 FINDINGS: Lower  chest: Small left pleural effusion with adjacent atelectasis/consolidation at the left lung base, new since previous. Coronary and aortic calcifications. Hepatobiliary: 1.3 cm enhancing lesion in hepatic segment 5, inconspicuous on the delayed scan and not evident on prior study which had slightly different contrast timing as well. No biliary ductal dilatation. Gallbladder normal. Pancreas: Unremarkable. No pancreatic ductal dilatation or surrounding inflammatory changes. Spleen: Normal in size without focal abnormality. Adrenals/Urinary Tract: No adrenal mass. No hydronephrosis. Stable 1.6 cm 16 HU probable cyst right lower pole; no follow-up recommended. Urinary bladder nondistended. Stomach/Bowel: Stomach incompletely distended, without acute finding. Small bowel decompressed. A loop of small bowel is involved in a left lower quadrant parastomal her hernia, without obstruction or strangulation. Normal appendix. Multiple colonic diverticula, without adjacent inflammatory change. Left lower quadrant colostomy. Decompressed Hartmann's pouch. Vascular/Lymphatic: Extensive calcified aortoiliac atheromatous plaque. Saccular aneurysm from the right posterolateral aspect of the infrarenal aorta, up to 3.3 cm diameter (previously 3.1). Left portal vein patent. No abdominal or pelvic adenopathy. Reproductive: Prostate is unremarkable.  Other: No ascites.  No free air. Musculoskeletal: Sternotomy wires. Mild lumbar levoscoliosis, with mild multilevel spondylitic change most marked L5-S1. IMPRESSION: 1. No acute findings. 2. Left lower quadrant parastomal hernia containing a loop of small bowel, without obstruction or strangulation. 3. 3.3 cm infrarenal saccular aortic aneurysm. Recommend referral to or continued care with vascular specialist. (Ref.: J Vasc Surg. 2018; 67:2-77 and J Am Coll Radiol 2013;10(10):789-794.) 4. 1.3 cm enhancing lesion in hepatic segment 5, possible FNH but nonspecific. MR liver with contrast may allow definitive characterization. 5. Small left pleural effusion with adjacent atelectasis/consolidation at the left lung base, new since previous. 6.  Aortic Atherosclerosis (ICD10-I70.0). Electronically Signed   By: Corlis Leak M.D.   On: 02/25/2023 19:27   DG Chest Portable 1 View  Result Date: 02/25/2023 CLINICAL DATA:  Chest pain.  Shortness of breath. EXAM: PORTABLE CHEST 1 VIEW COMPARISON:  01/28/2023. FINDINGS: There are left retrocardiac airspace opacities obscuring the left hemidiaphragm, descending thoracic aorta and blunting the left lateral costophrenic angle suggesting combination of left lung atelectasis and/or consolidation with pleural effusion. There are also probable atelectatic changes at the right lung base. Bilateral lung fields are otherwise clear. Right lateral costophrenic angle is clear. Stable cardio-mediastinal silhouette. Sternotomy wires noted. No acute osseous abnormalities. The soft tissues are within normal limits. IMPRESSION: *Left retrocardiac opacity, as discussed above. Follow-up to clearing is recommended. Electronically Signed   By: Jules Schick M.D.   On: 02/25/2023 16:40      Chrisann Melaragno T. Amany Rando [facility]  If 7PM-7AM, please contact night-coverage [email or web address] 02/26/2023, 11:43 AM

## 2023-02-26 NOTE — Inpatient Diabetes Management (Signed)
Inpatient Diabetes Program Recommendations  AACE/ADA: New Consensus Statement on Inpatient Glycemic Control (2015)  Target Ranges:  Prepandial:   less than 140 mg/dL      Peak postprandial:   less than 180 mg/dL (1-2 hours)      Critically ill patients:  140 - 180 mg/dL   Lab Results  Component Value Date   GLUCAP 90 02/26/2023   HGBA1C 7.4 (H) 01/20/2023    Review of Glycemic Control  Latest Reference Range & Units 02/25/23 23:02 02/26/23 06:05  Glucose-Capillary 70 - 99 mg/dL 782 (H) 90   Diabetes history: DM  Outpatient Diabetes medications:  Glipizide 10 mg bid Lantus 24 units q HS Actos 30 mg daily Current orders for Inpatient glycemic control:  Novolog 0-15 units tid with meals and HS  Inpatient Diabetes Program Recommendations:    Consider reducing Novolog correction to sensitive (0-9 units) tid with meals.   Thanks,  Beryl Meager, RN, BC-ADM Inpatient Diabetes Coordinator Pager [phone]  (8a-5p)

## 2023-02-26 NOTE — TOC Initial Note (Addendum)
Transition of Care [facility]) - Initial/Assessment Note    Patient Details  Name: Eddie Graham MRN: [id number] Date of Birth: 1934/12/10  Transition of Care [facility]) CM/SW Contact:    Leone Haven, RN Phone Number: 02/26/2023, 5:22 PM  Clinical Narrative:                  Used video intrepreter [phone] home with daughter, has PCP and insurance on file, states has no HH services in place at this time or DME at home.  States a friend will transport him  home at [employer] and  daughter is support system, states he will need oxygen at dc, NCM informed him will get someone to check his oxygen saturations.  Pta self ambulatory.   Expected Discharge Plan: Home/Self Care Barriers to Discharge: Continued Medical Work up   Patient Goals and CMS Choice Patient states their goals for this hospitalization and ongoing recovery are:: return home   Choice offered to / list presented to : NA      Expected Discharge Plan and Services   Discharge Planning Services: CM Consult Post Acute Care Choice: NA Living arrangements for the past 2 months: Single Family Home                 DME Arranged: N/A DME Agency: NA       HH Arranged: NA          Prior Living Arrangements/Services Living arrangements for the past 2 months: Single Family Home Lives with:: Adult Children Patient language and need for interpreter reviewed:: Yes Do you feel safe going back to the place where you live?: Yes      Need for Family Participation in Patient Care: Yes (Comment) Care giver support system in place?: Yes (comment)   Criminal Activity/Legal Involvement Pertinent to Current Situation/Hospitalization: No - Comment as needed  Activities of Daily Living   ADL Screening (condition at time of admission) Independently performs ADLs?: Yes (appropriate for developmental age) Is the patient deaf or have difficulty hearing?: No Does the patient have difficulty seeing, even when wearing  glasses/contacts?: No Does the patient have difficulty concentrating, remembering, or making decisions?: No  Permission Sought/Granted Permission sought to share information with : Case Manager Permission granted to share information with : Yes, Verbal Permission Granted              Emotional Assessment Appearance:: Appears stated age Attitude/Demeanor/Rapport: Engaged Affect (typically observed): Appropriate Orientation: : Oriented to Self, Oriented to Place, Oriented to  Time, Oriented to Situation   Psych Involvement: No (comment)  Admission diagnosis:  Shortness of breath [R06.02] Community acquired bacterial pneumonia [J15.9] Pneumonia of left lower lobe due to infectious organism [J18.9] Infrarenal abdominal aortic aneurysm (AAA) without rupture (HCC) [I71.43] Patient Active Problem List   Diagnosis Date Noted   Community acquired bacterial pneumonia 02/25/2023   New onset a-fib (HCC) 01/26/2023   Lower GI bleeding 06/12/2022   CAD (coronary artery disease) 06/12/2022   CKD (chronic kidney disease) stage 3, GFR 30-59 ml/min (HCC) 06/12/2022   Acute GI bleeding 06/12/2022   Lesion of urinary bladder 06/12/2022   AAA (abdominal aortic aneurysm) (HCC) 06/12/2022   Diverticulitis 06/12/2022   Diverticulitis of large intestine with abscess without bleeding 03/10/2022   Type 2 diabetes mellitus with complication, without long-term current use of insulin (HCC) 12/20/2021   Sigmoid diverticulitis 12/17/2021   Urethral stricture 12/09/2021   Bladder mass 09/30/2021   AAA (abdominal aortic aneurysm) without rupture (HCC) 08/05/2021  PCP:  Andreas Blower., MD Pharmacy:   [facility] - [place], [place] - [address] [place] [address] Phone: [phone] Fax: [phone]  [id number] [place], [place] - [address] AT [facility] OF [facility] & [address] [place] [address] Phone:  [phone] Fax: [phone]  [facility] [address] [place] [address] Phone: [phone] Fax: [phone]  [facility] [address] [place] [address] Phone: [phone] Fax: [phone]  [facility] #[id number] [place], [place] - [address] AT [facility] OF [address] & [address] [place] [address] Phone: [phone] Fax: [phone]     Social Determinants of Health (SDOH) Social History: SDOH Screenings   Food Insecurity: No Food Insecurity (02/25/2023)  Housing: Low Risk  (01/26/2023)  Transportation Needs: No Transportation Needs (02/25/2023)  Utilities: Not At Risk (02/25/2023)  Financial Resource Strain: Low Risk  (08/29/2021)  Tobacco Use: Low Risk  (02/25/2023)   SDOH Interventions:     Readmission Risk Interventions     No data to display

## 2023-02-26 NOTE — Progress Notes (Signed)
Pharmacy Antibiotic Note  Ransom Nickson is a 88 y.o. male admitted on 02/25/2023 with pneumonia.  Pharmacy has been consulted for Vancomycin and Cefepime dosing.  Plan: Vancomycin 1500 mg IV now, then 750 mg IV q24h Cefepime 2 g IV q12h  Height: 5\' 6"  (167.6 cm) Weight: 94.1 kg (207 lb 8 oz) IBW/kg (Calculated) : 63.8  Temp (24hrs), Avg:98.2 F (36.8 C), Min:98 F (36.7 C), Max:99 F (37.2 C)  Recent Labs  Lab 02/25/23 1444  WBC 7.7  CREATININE 1.61*    Estimated Creatinine Clearance: 35.4 mL/min (A) (by C-G formula based on SCr of 1.61 mg/dL (H)).    No Known Allergies   Eddie Candle 02/26/2023 1:42 AM

## 2023-02-26 NOTE — Consult Note (Signed)
WOC Nurse ostomy consult note; patient had colostomy placed 05/19/2022 by [facility] Stoma type/location: LLQ colostomy  Stomal assessment/size:  pink moist, well budded, 1 3/4" round  Peristomal assessment: unable to assess as patients current pouch is intact and he deferred it to be changed at this time  Treatment options for stomal/peristomal skin: 2" barrier ring  Output  scant soft stool in pouch  Ostomy pouching: 2 piece 2 3/4"  Education provided: none, patient has established ostomy and has received education through [facility].   Current pouch is intact, I did offer to change pouch for patient so he would have a fresh pouch on but he deferred. I left a 2 3/4" skin barrier, pouch and barrier ring in room if needed by bedside to change.  Will place orders for 2 3/4" skin barrier Hart [place] #2), 2 3/4" pouch Hart [place] [phone]) and 2" barrier ring Hart [place] [phone]).   Enrolled patient in [employer] DC program: no established ostomy, states I have plenty of supplies at home   Discussed above with bedside nurse via chat. WOC team will not follow this established ostomy. Re-consult if further needs arise.   Thank you,    Priscella Mann MSN, RN-BC, [employer] [phone]

## 2023-02-27 DIAGNOSIS — I5023 Acute on chronic systolic (congestive) heart failure: Secondary | ICD-10-CM | POA: Diagnosis not present

## 2023-02-27 DIAGNOSIS — J159 Unspecified bacterial pneumonia: Secondary | ICD-10-CM | POA: Diagnosis not present

## 2023-02-27 LAB — RESPIRATORY PANEL BY PCR

## 2023-02-27 LAB — GLUCOSE, CAPILLARY
Glucose-Capillary: 122 mg/dL — ABNORMAL HIGH (ref 70–99)
Glucose-Capillary: 153 mg/dL — ABNORMAL HIGH (ref 70–99)
Glucose-Capillary: 240 mg/dL — ABNORMAL HIGH (ref 70–99)
Glucose-Capillary: 209 mg/dL — ABNORMAL HIGH (ref 70–99)

## 2023-02-27 LAB — COMPREHENSIVE METABOLIC PANEL
ALT: 14 U/L (ref 0–44)
AST: 15 U/L (ref 15–41)
Albumin: 3 g/dL — ABNORMAL LOW (ref 3.5–5.0)
Alkaline Phosphatase: 70 U/L (ref 38–126)
Anion gap: 10 (ref 5–15)
BUN: 28 mg/dL — ABNORMAL HIGH (ref 8–23)
CO2: 24 mmol/L (ref 22–32)
Calcium: 8.5 mg/dL — ABNORMAL LOW (ref 8.9–10.3)
Chloride: 101 mmol/L (ref 98–111)
Creatinine, Ser: 2.05 mg/dL — ABNORMAL HIGH (ref 0.61–1.24)
GFR, Estimated: 31 mL/min — ABNORMAL LOW (ref >60)
Glucose, Bld: 180 mg/dL — ABNORMAL HIGH (ref 70–99)
Potassium: 3.9 mmol/L (ref 3.5–5.1)
Sodium: 135 mmol/L (ref 135–145)
Total Bilirubin: 1.2 mg/dL (ref 0.3–1.2)
Total Protein: 6.5 g/dL (ref 6.5–8.1)

## 2023-02-27 LAB — CBC
HCT: 44.4 % (ref 39.0–52.0)
Hemoglobin: 14.3 g/dL (ref 13.0–17.0)
MCH: 29 pg (ref 26.0–34.0)
MCHC: 32.2 g/dL (ref 30.0–36.0)
MCV: 90.1 fL (ref 80.0–100.0)
Platelets: 258 10*3/uL (ref 150–400)
RBC: 4.93 MIL/uL (ref 4.22–5.81)
RDW: 15.1 % (ref 11.5–15.5)
WBC: 8.3 10*3/uL (ref 4.0–10.5)
nRBC: 0 % (ref 0.0–0.2)

## 2023-02-27 LAB — PROCALCITONIN: Procalcitonin: <0.1 ng/mL

## 2023-02-27 LAB — SARS CORONAVIRUS 2 BY RT PCR: SARS Coronavirus 2 by RT PCR: NEGATIVE

## 2023-02-27 LAB — MAGNESIUM: Magnesium: 2.3 mg/dL (ref 1.7–2.4)

## 2023-02-27 LAB — MRSA NEXT GEN BY PCR, NASAL: MRSA by PCR Next Gen: NOT DETECTED

## 2023-02-27 MED ORDER — METHYLPREDNISOLONE SODIUM SUCC 125 MG IJ SOLR
80.0000 mg | Freq: Every day | INTRAMUSCULAR | Status: DC
  Administered 2023-02-27 – 2023-03-01 (×3): 80 mg via INTRAVENOUS
  Filled 2023-02-27 (×3): qty 2

## 2023-02-27 NOTE — Progress Notes (Signed)
PROGRESS NOTE  Eddie Graham:[id number] DOB: 1934/04/28   PCP: Andreas Blower., MD  Patient is from: Home.  Independently ambulates at baseline.  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  He has chronic abdominal pain since his colectomy and colostomy.  Recently hospitalized from 10/1-10/4 for A-fib/flutter with RVR, chest and abdominal pain.  He underwent TURP bed on 10/15 outpatient.   In ED, febrile.  No leukocytosis.  BNP elevated to 252.  Troponin 22>> 23.  CXR raise concern for left retrocardiac opacity and blunting of left lateral costophrenic angle.  CT abdomen and pelvis without acute finding but 3 cm infrarenal AAA and 1.3 cm enhancing lesion in his liver.  Patient was started on IV Lasix as well as broad-spectrum antibiotics for pneumonia due to his recent hospitalization.  Unfortunately, he developed AKI with diuretics.  Continues to report abdominal pain radiating to his left chest, shortness of breath and cough.  MRSA PCR, COVID-19 and full RVP nonreactive.  Antibiotic de-escalated to ceftriaxone and Zithromax.  Also started on systemic steroid in addition to breathing treatments.     Subjective: Seen and examined earlier this morning with the help of video interpreter.  Reports sleeping last night due to IV start beeping.  Reports abdominal pain radiating into his left chest.  Also reports discomfort in his throat, shortness of breath and cough.  He is not a great historian.  Objective: Vitals:   02/27/23 0058 02/27/23  0500 02/27/23 0700 02/27/23 1128  BP: 128/74 (!) 134/96 (!) 139/95 114/89  Pulse: (!) 101 (!) 101    Resp: 20 19  18   Temp: (!) 97.5 F (36.4 C) (!) 97.4 F (36.3 C) 99.6 F (37.6 C)   TempSrc: Oral Oral Oral Oral  SpO2: 92% 97% 97%   Weight:  95.9 kg    Height:        Examination:  GENERAL: No apparent distress.  Nontoxic. HEENT: MMM.  Vision and hearing grossly intact.  NECK: Supple.  No apparent JVD.  RESP:  No IWOB.  Fair aeration bilaterally.  Some rhonchi. CVS:  RRR. Heart sounds normal.  ABD/GI/GU: BS+. Abd soft.  Mild diffuse tenderness.  No rebound.  Colostomy bag MSK/EXT:  Moves extremities. No apparent deformity. No edema.  Tenderness over left chest. SKIN: no apparent skin lesion or wound NEURO: Awake, alert and oriented appropriately.  No apparent focal neuro deficit. PSYCH: Calm. Normal affect.   Procedures:  None  Microbiology summarized: MRSA PCR screen nonreactive COVID-19 PCR nonreactive Full RVP nonreactive   Assessment and plan: Acute hypoxic respiratory failure ruled out.  No documented desaturation yet. -Wean him off oxygen completely to see how far his oxygen drops. -Ambulatory saturation -Incentive spirometry  Possible left lung pneumonia: Patient presents with SOB, edema and chronic abdominal pain radiating to left chest..  He has no fever or leukocytosis.  Pro-Cal negative.  Chest x-ray raises CXR raise concern for left retrocardiac opacity and blunting of left lateral costophrenic angle.  He has history of CHF but appears euvolemic on exam.  BNP is only 252.  COVID-19, MRSA PCR and full RVP nonreactive.  I wonder if his symptoms are related to COPD.  -De-escalated antibiotics to ceftriaxone and Zithromax.  Will discontinue if repeat procalcitonin negative -Incentive spirometry, ambulatory saturation, nebulizers -Discontinue IV Lasix in the setting of AKI  Acute on chronic HFmrEF: TTE on 10/2 with LVEF of 40 to 45%, global hypokinesis and  indeterminate DD.  On p.o. Lasix 20 mg daily at home.  Presented with shortness of breath and edema.  BNP slightly elevated to 252.  Started on IV Lasix but developed AKI. -Discontinue diuretics -Strict intake and output, daily weight, renal functions and electrolytes  COPD exacerbation: Likely contributing to his symptoms. -Start systemic steroid -Continue inhalers and nebulizers.  Atrial flutter: Rate controlled.  On metoprolol and Eliquis at home. -Continue home meds -Optimize electrolytes  Uncontrolled IDDM-2 with hyperglycemia, neuropathy and CK-3B: A1c 7.4% on 9/25. Recent Labs  Lab 02/25/23 2302 02/26/23 0605 02/26/23 1742 02/26/23 2055 02/27/23 0619  GLUCAP 136* 90 148* 131* 122*  -Continue SSI-sensitive.  Anticipate escalation with steroid -Continue gabapentin  AKI on CKD-3B: Baseline Cr ~1.6-1.7.  AKI likely due to diuretics. Recent Labs    07/18/22 0031 01/20/23 1522 01/26/23 1253 01/27/23 0437 01/28/23 0522 01/29/23 0453 02/03/23 1643 02/25/23 1444 02/26/23 0427 02/27/23 0241  BUN 31* 29* 33* 37* 39* 40* 38* 17 17 28*  CREATININE 1.60* 1.56* 1.94* 1.91* 1.71* 2.02* 1.90* 1.61* 1.69* 2.05*  -Diuretics discontinued -Continue monitoring  History of siogmoid abscess s/p end colostomy -Colostomy care  History of bladder cancer s/p TURB on 10/15 -Outpatient follow-up   Elevated troponins: Mild without significant delta. -No further workup needed   Hepatic lesion: CT abdomen and pelvis showed 1.3 cm hepatic lesion.  Discussed with patient. -Needs MRI outpatient   Infrarenal AAA-3 cm on CT -outpatient follow-up   Chronic abdominal and left chest pain: Chronic since colostomy.  Denies change.  CT abdomen and pelvis without acute finding.  Left chest pain reproducible with palpation. -Continue home meds   Hyperlipidemia -Continue Crestor   BPH -continue Flomax   Obesity Body mass index is 34.12 kg/m.           DVT prophylaxis:  apixaban  (ELIQUIS) tablet 2.5 mg Start: 02/25/23 2200 SCDs Start: 02/25/23 2053 apixaban (ELIQUIS) tablet 2.5 mg  Code Status: Full code Family Communication: None at bedside Level of care: Telemetry Medical Status is: Inpatient Remains inpatient appropriate because: Pneumonia, COPD exacerbation   Final disposition: Home once medically ready Consultants:  None  55 minutes with more than 50% spent in reviewing records, counseling patient/family and coordinating care.   Sch Meds:  Scheduled Meds:  apixaban  2.5 mg Oral BID   DULoxetine  60 mg Oral Daily   gabapentin  100 mg Oral TID   insulin aspart  0-5 Units Subcutaneous QHS   insulin aspart  0-9 Units Subcutaneous TID WC   methylPREDNISolone (SOLU-MEDROL) injection  80 mg Intravenous Daily   metoprolol succinate  100 mg Oral Daily   montelukast  10 mg Oral Daily   rosuvastatin  40 mg Oral Daily   tamsulosin  0.4 mg Oral Daily   umeclidinium-vilanterol  1 puff Inhalation Daily   Continuous Infusions:  cefTRIAXone (ROCEPHIN)  IV 1 g (02/26/23 1319)   PRN Meds:.acetaminophen **OR** acetaminophen, ipratropium-albuterol, ondansetron **OR** ondansetron (ZOFRAN) IV, mouth rinse, oxyCODONE  Antimicrobials: Anti-infectives (From admission, onward)    Start     Dose/Rate Route Frequency Ordered Stop   02/27/23 1000  vancomycin (VANCOREADY) IVPB  750 mg/150 mL  Status:  Discontinued        750 mg 150 mL/hr over 60 Minutes Intravenous Every 24 hours 02/26/23 0144 02/27/23 1151   02/26/23 1300  azithromycin (ZITHROMAX) tablet 500 mg        500 mg Oral Daily 02/26/23 1207 02/27/23 1127   02/26/23 1300  cefTRIAXone (ROCEPHIN) 1 g in sodium chloride 0.9 % 100 mL IVPB        1 g 200 mL/hr over 30 Minutes Intravenous Every 24 hours 02/26/23 1207 03/02/23 1259   02/26/23 0900  cefTRIAXone (ROCEPHIN) 2 g in sodium chloride 0.9 % 100 mL IVPB  Status:  Discontinued        2 g 200 mL/hr over 30 Minutes Intravenous Every 24 hours 02/25/23 2102  02/26/23 0132   02/26/23 0900  azithromycin (ZITHROMAX) tablet 500 mg  Status:  Discontinued        500 mg Oral Daily 02/25/23 2103 02/26/23 0132   02/26/23 0230  vancomycin (VANCOREADY) IVPB 1500 mg/300 mL        1,500 mg 150 mL/hr over 120 Minutes Intravenous  Once 02/26/23 0144 02/26/23 0548   02/26/23 0230  ceFEPIme (MAXIPIME) 2 g in sodium chloride 0.9 % 100 mL IVPB  Status:  Discontinued        2 g 200 mL/hr over 30 Minutes Intravenous 2 times daily 02/26/23 0144 02/26/23 1207   02/25/23 2000  cefTRIAXone (ROCEPHIN) 1 g in sodium chloride 0.9 % 100 mL IVPB        1 g 200 mL/hr over 30 Minutes Intravenous  Once 02/25/23 1952 02/25/23 2043   02/25/23 2000  azithromycin (ZITHROMAX) 500 mg in dextrose 5 % 250 mL IVPB        500 mg 250 mL/hr over 60 Minutes Intravenous  Once 02/25/23 1952 02/25/23 2203        I have personally reviewed the following labs and images: CBC: Recent Labs  Lab 02/25/23 1444 02/26/23 0427 02/27/23 0241  WBC 7.7 8.3 8.3  HGB 13.1 13.6 14.3  HCT 41.0 42.9 44.4  MCV 92.1 91.3 90.1  PLT 230 234 258   BMP &GFR Recent Labs  Lab 02/25/23 1444 02/26/23 0427 02/27/23 0241  NA 138 137 135  K 4.0 4.0 3.9  CL 105 100 101  CO2 23 24 24   GLUCOSE 62* 90 180*  BUN 17 17 28*  CREATININE 1.61* 1.69* 2.05*  CALCIUM 8.8* 8.6* 8.5*  MG  --   --  2.3   Estimated Creatinine Clearance: 28 mL/min (A) (by C-G formula based on SCr of 2.05 mg/dL (H)). Liver & Pancreas: Recent Labs  Lab 02/27/23 0241  AST 15  ALT 14  ALKPHOS 70  BILITOT 1.2  PROT 6.5  ALBUMIN 3.0*   No results for input(s): "LIPASE", "AMYLASE" in the last 168 hours. No results for input(s): "AMMONIA" in the last 168 hours. Diabetic: No results for input(s): "HGBA1C" in the last 72 hours. Recent Labs  Lab 02/25/23 2302 02/26/23 0605 02/26/23 1742 02/26/23 2055 02/27/23 0619  GLUCAP 136* 90 148* 131* 122*   Cardiac Enzymes: No results for input(s): "CKTOTAL", "CKMB",  "CKMBINDEX", "TROPONINI" in the last 168 hours. No results for input(s): "PROBNP" in the last 8760 hours. Coagulation Profile: No results for input(s): "INR", "PROTIME" in the last 168 hours. Thyroid Function Tests: No results for input(s): "TSH", "T4TOTAL", "FREET4", "T3FREE", "THYROIDAB" in the last 72 hours. Lipid Profile: No results for input(s): "CHOL", "HDL", "LDLCALC", "TRIG", "CHOLHDL", "  LDLDIRECT" in the last 72 hours. Anemia Panel: No results for input(s): "VITAMINB12", "FOLATE", "FERRITIN", "TIBC", "IRON", "RETICCTPCT" in the last 72 hours. Urine analysis:    Component Value Date/Time   COLORURINE STRAW (A) 07/18/2022 0226   APPEARANCEUR CLEAR 07/18/2022 0226   LABSPEC 1.036 (H) 07/18/2022 0226   PHURINE 6.0 07/18/2022 0226   GLUCOSEU NEGATIVE 07/18/2022 0226   HGBUR NEGATIVE 07/18/2022 0226   BILIRUBINUR NEGATIVE 07/18/2022 0226   KETONESUR NEGATIVE 07/18/2022 0226   PROTEINUR NEGATIVE 07/18/2022 0226   NITRITE NEGATIVE 07/18/2022 0226   LEUKOCYTESUR NEGATIVE 07/18/2022 0226   Sepsis Labs: Invalid input(s): "PROCALCITONIN", "LACTICIDVEN"  Microbiology: Recent Results (from the past 240 hour(s))  SARS Coronavirus 2 by RT PCR (hospital order, performed in [facility] hospital lab) *cepheid single result test* Anterior Nasal Swab     Status: None   Collection Time: 02/27/23  7:16 AM   Specimen: Anterior Nasal Swab  Result Value Ref Range Status   SARS Coronavirus 2 by RT PCR NEGATIVE NEGATIVE Final    Comment: Performed at [facility], [address] [place], [place] [address]  MRSA Next Gen by PCR, Nasal     Status: None   Collection Time: 02/27/23  9:00 AM  Result Value Ref Range Status   MRSA by PCR Next Gen NOT DETECTED NOT DETECTED Final    Comment: (NOTE) The GeneXpert MRSA Assay (FDA approved for NASAL specimens only), is one component of a comprehensive MRSA colonization surveillance program. It is not intended to diagnose MRSA infection nor to  guide or monitor treatment for MRSA infections. Test performance is not FDA approved in patients less than 53 years old. Performed at [facility], [address], [place], [place] [address]   Respiratory (~20 pathogens) panel by PCR     Status: None   Collection Time: 02/27/23  9:04 AM   Specimen: Nasopharyngeal Swab; Respiratory  Result Value Ref Range Status   Adenovirus NOT DETECTED NOT DETECTED Final   Coronavirus 229E NOT DETECTED NOT DETECTED Final    Comment: (NOTE) The Coronavirus on the Respiratory Panel, DOES NOT test for the novel  Coronavirus (2019 nCoV)    Coronavirus HKU1 NOT DETECTED NOT DETECTED Final   Coronavirus NL63 NOT DETECTED NOT DETECTED Final   Coronavirus OC43 NOT DETECTED NOT DETECTED Final   Metapneumovirus NOT DETECTED NOT DETECTED Final   Rhinovirus / Enterovirus NOT DETECTED NOT DETECTED Final   Influenza A NOT DETECTED NOT DETECTED Final   Influenza B NOT DETECTED NOT DETECTED Final   Parainfluenza Virus 1 NOT DETECTED NOT DETECTED Final   Parainfluenza Virus 2 NOT DETECTED NOT DETECTED Final   Parainfluenza Virus 3 NOT DETECTED NOT DETECTED Final   Parainfluenza Virus 4 NOT DETECTED NOT DETECTED Final   Respiratory Syncytial Virus NOT DETECTED NOT DETECTED Final   Bordetella pertussis NOT DETECTED NOT DETECTED Final   Bordetella Parapertussis NOT DETECTED NOT DETECTED Final   Chlamydophila pneumoniae NOT DETECTED NOT DETECTED Final   Mycoplasma pneumoniae NOT DETECTED NOT DETECTED Final    Comment: Performed at [facility], [address] [place], [place] [address]    Radiology Studies: No results found.    Mahari Vankirk T. Bobbiejo Ishikawa [facility]  If 7PM-7AM, please contact night-coverage [email or web address] 02/27/2023, 1:16 PM

## 2023-02-27 NOTE — Plan of Care (Signed)
  Problem: Nutrition: Goal: Adequate nutrition will be maintained Outcome: Completed/Met   Problem: Elimination: Goal: Will not experience complications related to bowel motility Outcome: Completed/Met Goal: Will not experience complications related to urinary retention Outcome: Completed/Met   Problem: Skin Integrity: Goal: Risk for impaired skin integrity will decrease Outcome: Completed/Met   Problem: Skin Integrity: Goal: Risk for impaired skin integrity will decrease Outcome: Completed/Met

## 2023-02-27 NOTE — Evaluation (Signed)
Physical Therapy Evaluation Patient Details Name: Eddie Graham MRN: [id number] DOB: Feb 24, 1935 Today's Date: 02/27/2023  History of Present Illness  88 y/o male presents to [facility] 10/31 with difficulty catching his breath, abdominal pain, and weight gain. Admitted w/ L pleural effusion and PNA. PMH: CAD, COPD, HTN, AAA, DM, neuropathy, CKD, depression, back pain, anemia, a-fib/flutter, colostomy   Clinical Impression  Pt in bed upon arrival and agreeable to PT eval. Prior to admission, pt was walking independently with no AD. Pt was able to ambulate to the bathroom with IV pole and CGA. Pt became SOB on 3L [place], however, SpO2 >90%. Pt presents to therapy session below baseline with decreased balance, mobility, and activity tolerance. Pt would benefit from acute skilled PT to address functional impairments. Recommending post-acute HHPT to work towards independence. Acute PT to follow.          If plan is discharge home, recommend the following: A little help with walking and/or transfers   Can travel by private vehicle    Yes    Equipment Recommendations None recommended by PT     Functional Status Assessment Patient has had a recent decline in their functional status and demonstrates the ability to make significant improvements in function in a reasonable and predictable amount of time.     Precautions / Restrictions Precautions Precautions: Fall Restrictions Weight Bearing Restrictions: No      Mobility  Bed Mobility Overal bed mobility: Modified Independent   Transfers Overall transfer level: Needs assistance Equipment used: None Transfers: Sit to/from Stand Sit to Stand: Supervision    General transfer comment: supervision for safety    Ambulation/Gait Ambulation/Gait assistance: Contact guard assist Gait Distance (Feet): 40 Feet (x20, x20) Assistive device: IV Pole Gait Pattern/deviations: Step-through pattern       General Gait Details: steady gait  while holding IV pole. Pt became SOB on 3L with SpO2 >90. Cues for deep breathing      Balance Overall balance assessment: Needs assistance, Mild deficits observed, not formally tested Sitting-balance support: No upper extremity supported, Feet supported Sitting balance-Leahy Scale: Good     Standing balance support: Single extremity supported, During functional activity, Reliant on assistive device for balance Standing balance-Leahy Scale: Fair Standing balance comment: able to stand w/ no AD, prefers UE support when ambulating         Pertinent Vitals/Pain Pain Assessment Pain Assessment: Faces Faces Pain Scale: Hurts even more Pain Location: abdomen Pain Descriptors / Indicators: Aching, Discomfort Pain Intervention(s): Limited activity within patient's tolerance, Monitored during session, Repositioned    Home Living Family/patient expects to be discharged to:: Private residence Living Arrangements: Alone Available Help at Discharge: Friend(s);Available PRN/intermittently Type of Home: Apartment Home Access: Level entry       Home Layout: One level Home Equipment: Agricultural consultant (2 wheels);Cane - single point;Electric scooter      Prior Function Prior Level of Function : Independent/Modified Independent             Mobility Comments: Independent w/ no AD, refuses to use AD ADLs Comments: Independent w/ ADLs     Extremity/Trunk Assessment   Upper Extremity Assessment Upper Extremity Assessment: Defer to OT evaluation    Lower Extremity Assessment Lower Extremity Assessment: Overall WFL for tasks assessed    Cervical / Trunk Assessment Cervical / Trunk Assessment: Normal  Communication   Communication Communication: No apparent difficulties;Other (comment) (spanish is primary language, able to understand questions w/ friend assisting as needed) Cueing Techniques: Verbal cues  Cognition Arousal: Alert Behavior During Therapy: WFL for tasks  assessed/performed Overall Cognitive Status: Within Functional Limits for tasks assessed         General Comments General comments (skin integrity, edema, etc.): VSS on 3L     PT Assessment Patient needs continued PT services  PT Problem List Decreased activity tolerance;Decreased balance;Decreased mobility       PT Treatment Interventions DME instruction;Gait training;Functional mobility training;Therapeutic activities;Therapeutic exercise;Balance training;Neuromuscular re-education;Patient/family education    PT Goals (Current goals can be found in the Care Plan section)  Acute Rehab PT Goals Patient Stated Goal: to go home PT Goal Formulation: With patient Time For Goal Achievement: 03/13/23 Potential to Achieve Goals: Good    Frequency Min 1X/week        AM-PAC PT "6 Clicks" Mobility  Outcome Measure Help needed turning from your back to your side while in a flat bed without using bedrails?: None Help needed moving from lying on your back to sitting on the side of a flat bed without using bedrails?: A Little Help needed moving to and from a bed to a chair (including a wheelchair)?: A Little Help needed standing up from a chair using your arms (e.g., wheelchair or bedside chair)?: A Little Help needed to walk in hospital room?: A Little Help needed climbing 3-5 steps with a railing? : A Lot 6 Click Score: 18    End of Session Equipment Utilized During Treatment: Oxygen Activity Tolerance: Patient limited by fatigue Patient left: in chair;with call bell/phone within reach;with family/visitor present Nurse Communication: Mobility status PT Visit Diagnosis: Unsteadiness on feet (R26.81);Other abnormalities of gait and mobility (R26.89)    Time: 2130-8657 PT Time Calculation (min) (ACUTE ONLY): 19 min   Charges:   PT Evaluation $PT Eval Low Complexity: 1 Low   PT General Charges $$ ACUTE PT VISIT: 1 Visit         Hilton Cork, PT, DPT Secure Chat Preferred   Rehab Office [phone]   Arturo Morton Brion Aliment 02/27/2023, 2:28 PM

## 2023-02-27 NOTE — Progress Notes (Signed)
SATURATION QUALIFICATIONS: (This note is used to comply with regulatory documentation for home oxygen)  Patient Saturations on Room Air at Rest = 90% Pt was very dizzy without the O2, was not able to go  w/o O2 yet. Pt was not able to ambulate without O2 yet as well.  Pt is improving on his SOB, but he still has the SOB on exertion.   Patient Saturations on 1 Liters of oxygen while Ambulating = 92% Pt ambulated from his room to the Nurses Station and back, he was very SOB by the time he reached his room, and his sats went down to 89. Pt may not be ready to go home yet, due to the SOB on exertion. Thank you, Lavonda Jumbo RN

## 2023-02-27 NOTE — Plan of Care (Signed)

## 2023-02-28 DIAGNOSIS — J159 Unspecified bacterial pneumonia: Secondary | ICD-10-CM | POA: Diagnosis not present

## 2023-02-28 DIAGNOSIS — I5023 Acute on chronic systolic (congestive) heart failure: Secondary | ICD-10-CM | POA: Diagnosis not present

## 2023-02-28 LAB — RENAL FUNCTION PANEL
Albumin: 3.3 g/dL — ABNORMAL LOW (ref 3.5–5.0)
Anion gap: 10 (ref 5–15)
BUN: 32 mg/dL — ABNORMAL HIGH (ref 8–23)
CO2: 22 mmol/L (ref 22–32)
Calcium: 8.7 mg/dL — ABNORMAL LOW (ref 8.9–10.3)
Chloride: 100 mmol/L (ref 98–111)
Creatinine, Ser: 1.83 mg/dL — ABNORMAL HIGH (ref 0.61–1.24)
GFR, Estimated: 35 mL/min — ABNORMAL LOW (ref >60)
Glucose, Bld: 197 mg/dL — ABNORMAL HIGH (ref 70–99)
Phosphorus: 4.5 mg/dL (ref 2.5–4.6)
Potassium: 4.9 mmol/L (ref 3.5–5.1)
Sodium: 132 mmol/L — ABNORMAL LOW (ref 135–145)

## 2023-02-28 LAB — CBC
HCT: 44.4 % (ref 39.0–52.0)
Hemoglobin: 14.6 g/dL (ref 13.0–17.0)
MCH: 29.7 pg (ref 26.0–34.0)
MCHC: 32.9 g/dL (ref 30.0–36.0)
MCV: 90.2 fL (ref 80.0–100.0)
Platelets: 260 10*3/uL (ref 150–400)
RBC: 4.92 MIL/uL (ref 4.22–5.81)
RDW: 14.6 % (ref 11.5–15.5)
WBC: 9.7 10*3/uL (ref 4.0–10.5)
nRBC: 0 % (ref 0.0–0.2)

## 2023-02-28 LAB — GLUCOSE, CAPILLARY
Glucose-Capillary: 180 mg/dL — ABNORMAL HIGH (ref 70–99)
Glucose-Capillary: 167 mg/dL — ABNORMAL HIGH (ref 70–99)
Glucose-Capillary: 154 mg/dL — ABNORMAL HIGH (ref 70–99)
Glucose-Capillary: 159 mg/dL — ABNORMAL HIGH (ref 70–99)

## 2023-02-28 LAB — MAGNESIUM: Magnesium: 2.5 mg/dL — ABNORMAL HIGH (ref 1.7–2.4)

## 2023-02-28 LAB — BRAIN NATRIURETIC PEPTIDE: B Natriuretic Peptide: 506.3 pg/mL — ABNORMAL HIGH (ref 0.0–100.0)

## 2023-02-28 MED ORDER — GUAIFENESIN 100 MG/5ML PO LIQD
5.0000 mL | ORAL | Status: DC | PRN
  Administered 2023-02-28 (×3): 5 mL via ORAL
  Filled 2023-02-28 (×3): qty 10

## 2023-02-28 MED ORDER — INSULIN GLARGINE-YFGN 100 UNIT/ML ~~LOC~~ SOLN
15.0000 [IU] | Freq: Every day | SUBCUTANEOUS | Status: DC
  Administered 2023-02-28 – 2023-03-01 (×2): 15 [IU] via SUBCUTANEOUS
  Filled 2023-02-28 (×2): qty 0.15

## 2023-02-28 MED ORDER — CLONAZEPAM 0.125 MG PO TBDP
0.5000 mg | ORAL_TABLET | Freq: Two times a day (BID) | ORAL | Status: DC | PRN
  Administered 2023-02-28 – 2023-03-01 (×2): 0.5 mg via ORAL
  Filled 2023-02-28 (×2): qty 4

## 2023-02-28 MED ORDER — SODIUM CHLORIDE 0.9 % IV SOLN
1.0000 g | INTRAVENOUS | Status: AC
  Administered 2023-02-28: 1 g via INTRAVENOUS
  Filled 2023-02-28: qty 10

## 2023-02-28 NOTE — Progress Notes (Signed)
Mobility Specialist Progress Note:   02/28/23 1427  Mobility  Activity Ambulated with assistance in hallway  Level of Assistance Contact guard assist, steadying assist  Assistive Device Front wheel walker  Distance Ambulated (ft) 100 ft  Activity Response Tolerated well  Mobility Referral Yes  $Mobility charge 1 Mobility  Mobility Specialist Start Time (ACUTE ONLY) 1410  Mobility Specialist Stop Time (ACUTE ONLY) 1424  Mobility Specialist Time Calculation (min) (ACUTE ONLY) 14 min    Received pt at sink having no complaints and agreeable to mobility. Pt was asymptomatic throughout ambulation. Ambulated on RA, VSS. Had x2 momentary LOB corrected by mobility specialist w/ MinA. Returned to room w/o fault. Left sitting EOB w/ call bell in reach and all needs met.  Pre Mobility RA SPO2 95% During Mobility RA SPO2 89%-91% Post Mobility RA Spo2 95%  Thompson Grayer Mobility Specialist  Please contact vis Secure Chat or  Rehab Office [phone]

## 2023-02-28 NOTE — Progress Notes (Signed)
PROGRESS NOTE  Eddie Graham WNU:[id number] DOB: February 01, 1935   PCP: Andreas Blower., MD  Patient is from: Home.  Independently ambulates at baseline.  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  He has chronic abdominal pain since his colectomy and colostomy.  Recently hospitalized from 10/1-10/4 for A-fib/flutter with RVR, chest and abdominal pain.  He underwent TURP bed on 10/15 outpatient.   In ED, febrile.  No leukocytosis.  BNP elevated to 252.  Troponin 22>> 23.  CXR raise concern for left retrocardiac opacity and blunting of left lateral costophrenic angle.  CT abdomen and pelvis without acute finding but 3 cm infrarenal AAA and 1.3 cm enhancing lesion in his liver.  Patient was started on IV Lasix as well as broad-spectrum antibiotics for pneumonia due to his recent hospitalization.  Unfortunately, he developed AKI with diuretics.  Continues to report abdominal pain radiating to his left chest, shortness of breath and cough.  MRSA PCR, COVID-19 and full RVP nonreactive.  Antibiotic de-escalated to ceftriaxone and Zithromax.  Also started on systemic steroid in addition to breathing treatments.   Subjective: Seen and examined earlier this morning with the help of video interpreter.  No major events overnight of this morning.  Reports some improvement in his breathing but continues to endorse abdominal pain radiating to his left chest.  He rates his pain as severe although he does not appear to be in that much distress.  Somewhat anxious.   Not a great historian either.  Objective: Vitals:   02/28/23 0940 02/28/23 1141 02/28/23 1315 02/28/23 1316  BP:  113/74    Pulse:  81    Resp:  (!) 21    Temp:  97.7 F (36.5 C)    TempSrc:  Oral    SpO2: 97% 100% (!) 86% 90%  Weight:      Height:        Examination:  GENERAL: No apparent distress.  Nontoxic. HEENT: MMM.  Vision and hearing grossly intact.  NECK: Supple.  No apparent JVD.  RESP:  No IWOB.  Fair aeration bilaterally.  Some rhonchi. CVS:  RRR. Heart sounds normal.  ABD/GI/GU: BS+. Abd soft.  Mild diffuse tenderness.  No rebound.  Colostomy bag MSK/EXT:  Moves extremities. No apparent deformity. No edema.  Tenderness over left chest. SKIN: no apparent skin lesion or wound NEURO: Awake, alert and oriented appropriately.  No apparent focal neuro deficit. PSYCH: Somewhat anxious.  Procedures:  None  Microbiology summarized: MRSA PCR screen nonreactive COVID-19 PCR nonreactive Full RVP nonreactive   Assessment and plan: Acute hypoxic respiratory failure: Desaturated to 86% with ambulation on 1 L.  Most likely due to COPD exacerbation.  Possible pneumonia.  -Manage COPD as below -Completed antibiotic course for possible pneumonia -Inhalers and nebulizers -Ambulatory saturation, incentive spirometry -Wean oxygen as able  Possible left lung pneumonia: Patient presents with SOB, edema and chronic abdominal pain radiating to left chest..  He has no fever or leukocytosis.  Pro-Cal persistently negative.  Chest x-ray raises CXR raise concern for left retrocardiac opacity and blunting of left lateral  costophrenic angle.  He has history of CHF but appears euvolemic on exam.  COVID-19, MRSA PCR and full RVP nonreactive.  Some improvement in his breathing after steroid. -Received 4 days of antibiotics -Incentive spirometry, ambulatory saturation, nebulizers  COPD exacerbation: Likely contributing to his symptoms. -Start systemic steroid -Continue inhalers and  nebulizers.  Acute on chronic HFmrEF: TTE on 10/2 with LVEF of 40 to 45%, global hypokinesis and indeterminate DD.  On p.o. Lasix 20 mg daily at home.  Presented with shortness of breath and edema.  BNP 252>> 500.  Started on IV Lasix but developed AKI.  Appears euvolemic. -Continue holding diuretics. -Strict intake and output, daily weight, renal functions and electrolytes  Atrial flutter: Rate controlled.  On metoprolol and Eliquis at home. -Continue home meds -Optimize electrolytes  Uncontrolled IDDM-2 with hyperglycemia, neuropathy and CK-3B: A1c 7.4% on 9/25. Recent Labs  Lab 02/27/23 1206 02/27/23 1654 02/27/23 2109 02/28/23 0530 02/28/23 1117  GLUCAP 153* 240* 209* 180* 167*  -Continue SSI-sensitive.  -Add Semglee 15 units daily -Continue gabapentin  AKI on CKD-3B: Baseline Cr ~1.6-1.7.  AKI likely due to diuretics.  Improving. Recent Labs    01/20/23 1522 01/26/23 1253 01/27/23 0437 01/28/23 0522 01/29/23 0453 02/03/23 1643 02/25/23 1444 02/26/23 0427 02/27/23 0241 02/28/23 0255  BUN 29* 33* 37* 39* 40* 38* 17 17 28* 32*  CREATININE 1.56* 1.94* 1.91* 1.71* 2.02* 1.90* 1.61* 1.69* 2.05* 1.83*  -Continue holding diuretics -Continue monitoring  History of siogmoid abscess s/p end colostomy -Colostomy care  History of bladder cancer s/p TURB on 10/15 -Outpatient follow-up   Elevated troponins: Mild without significant delta. -No further workup needed   Hepatic lesion: CT abdomen and pelvis showed 1.3 cm hepatic lesion.  Discussed with patient. -Needs MRI outpatient   Infrarenal AAA-3 cm on CT -outpatient follow-up   Chronic abdominal and left chest pain: Chronic since colostomy.  Denies change.  CT abdomen and pelvis without acute finding.  Left chest pain reproducible with palpation. -Continue home meds   Hyperlipidemia -Continue Crestor   BPH -continue Flomax   Obesity Body mass index is 34.2 kg/m.           DVT prophylaxis:  apixaban  (ELIQUIS) tablet 2.5 mg Start: 02/25/23 2200 SCDs Start: 02/25/23 2053 apixaban (ELIQUIS) tablet 2.5 mg  Code Status: Full code Family Communication: None at bedside Level of care: Telemetry Medical Status is: Inpatient Remains inpatient appropriate because: Pneumonia, COPD exacerbation   Final disposition: Home once medically ready Consultants:  None  55 minutes with more than 50% spent in reviewing records, counseling patient/family and coordinating care.   Sch Meds:  Scheduled Meds:  apixaban  2.5 mg Oral BID   DULoxetine  60 mg Oral Daily   gabapentin  100 mg Oral TID   insulin aspart  0-5 Units Subcutaneous QHS   insulin aspart  0-9 Units Subcutaneous TID WC   insulin glargine-yfgn  15 Units Subcutaneous Daily   methylPREDNISolone (SOLU-MEDROL) injection  80 mg Intravenous Daily   metoprolol succinate  100 mg Oral Daily   montelukast  10 mg Oral Daily   rosuvastatin  40 mg Oral Daily   tamsulosin  0.4 mg Oral Daily   umeclidinium-vilanterol  1 puff Inhalation Daily   Continuous Infusions:   PRN Meds:.acetaminophen **OR** acetaminophen, clonazepam, guaiFENesin, ipratropium-albuterol, ondansetron **OR** ondansetron (ZOFRAN) IV, mouth rinse, oxyCODONE  Antimicrobials: Anti-infectives (From admission, onward)    Start     Dose/Rate Route Frequency Ordered Stop   02/28/23 0845  cefTRIAXone (ROCEPHIN) 1 g in sodium chloride 0.9 % 100 mL IVPB        1 g 200 mL/hr over 30 Minutes Intravenous Every 24 hours 02/28/23 0745 02/28/23 1006   02/27/23 1000  vancomycin (VANCOREADY) IVPB 750 mg/150 mL  Status:  Discontinued        750 mg 150 mL/hr over 60 Minutes Intravenous Every 24 hours 02/26/23 0144 02/27/23 1151   02/26/23 1300  azithromycin (ZITHROMAX) tablet 500 mg        500 mg Oral Daily 02/26/23 1207 02/27/23 1127   02/26/23 1300  cefTRIAXone (ROCEPHIN) 1 g in sodium chloride 0.9 % 100 mL IVPB  Status:  Discontinued        1 g 200 mL/hr over 30 Minutes Intravenous  Every 24 hours 02/26/23 1207 02/28/23 0745   02/26/23 0900  cefTRIAXone (ROCEPHIN) 2 g in sodium chloride 0.9 % 100 mL IVPB  Status:  Discontinued        2 g 200 mL/hr over 30 Minutes Intravenous Every 24 hours 02/25/23 2102 02/26/23 0132   02/26/23 0900  azithromycin (ZITHROMAX) tablet 500 mg  Status:  Discontinued        500 mg Oral Daily 02/25/23 2103 02/26/23 0132   02/26/23 0230  vancomycin (VANCOREADY) IVPB 1500 mg/300 mL        1,500 mg 150 mL/hr over 120 Minutes Intravenous  Once 02/26/23 0144 02/26/23 0548   02/26/23 0230  ceFEPIme (MAXIPIME) 2 g in sodium chloride 0.9 % 100 mL IVPB  Status:  Discontinued        2 g 200 mL/hr over 30 Minutes Intravenous 2 times daily 02/26/23 0144 02/26/23 1207   02/25/23 2000  cefTRIAXone (ROCEPHIN) 1 g in sodium chloride 0.9 % 100 mL IVPB        1 g 200 mL/hr over 30 Minutes Intravenous  Once 02/25/23 1952 02/25/23 2043   02/25/23 2000  azithromycin (ZITHROMAX) 500 mg in dextrose 5 % 250 mL IVPB        500 mg 250 mL/hr over 60 Minutes Intravenous  Once 02/25/23 1952 02/25/23 2203        I have personally reviewed the following labs and images: CBC: Recent Labs  Lab 02/25/23 1444 02/26/23 0427 02/27/23 0241 02/28/23 0255  WBC 7.7 8.3 8.3 9.7  HGB 13.1 13.6 14.3 14.6  HCT 41.0 42.9 44.4 44.4  MCV 92.1 91.3 90.1 90.2  PLT 230 234 258 260   BMP &GFR Recent Labs  Lab 02/25/23 1444 02/26/23 0427 02/27/23 0241 02/28/23 0255  NA 138 137 135 132*  K 4.0 4.0 3.9 4.9  CL 105 100 101 100  CO2 23 24 24 22   GLUCOSE 62* 90 180* 197*  BUN 17 17 28* 32*  CREATININE 1.61* 1.69* 2.05* 1.83*  CALCIUM 8.8* 8.6* 8.5* 8.7*  MG  --   --  2.3 2.5*  PHOS  --   --   --  4.5   Estimated Creatinine Clearance: 31.4 mL/min (A) (by C-G formula based on SCr of 1.83 mg/dL (H)). Liver & Pancreas: Recent Labs  Lab 02/27/23 0241 02/28/23 0255  AST 15  --   ALT 14  --   ALKPHOS 70  --   BILITOT 1.2  --   PROT 6.5  --   ALBUMIN 3.0* 3.3*   No  results for input(s): "LIPASE", "AMYLASE" in the last 168 hours. No results for input(s): "AMMONIA" in the last 168 hours. Diabetic: No results for input(s): "HGBA1C"  in the last 72 hours. Recent Labs  Lab 02/27/23 1206 02/27/23 1654 02/27/23 2109 02/28/23 0530 02/28/23 1117  GLUCAP 153* 240* 209* 180* 167*   Cardiac Enzymes: No results for input(s): "CKTOTAL", "CKMB", "CKMBINDEX", "TROPONINI" in the last 168 hours. No results for input(s): "PROBNP" in the last 8760 hours. Coagulation Profile: No results for input(s): "INR", "PROTIME" in the last 168 hours. Thyroid Function Tests: No results for input(s): "TSH", "T4TOTAL", "FREET4", "T3FREE", "THYROIDAB" in the last 72 hours. Lipid Profile: No results for input(s): "CHOL", "HDL", "LDLCALC", "TRIG", "CHOLHDL", "LDLDIRECT" in the last 72 hours. Anemia Panel: No results for input(s): "VITAMINB12", "FOLATE", "FERRITIN", "TIBC", "IRON", "RETICCTPCT" in the last 72 hours. Urine analysis:    Component Value Date/Time   COLORURINE STRAW (A) 07/18/2022 0226   APPEARANCEUR CLEAR 07/18/2022 0226   LABSPEC 1.036 (H) 07/18/2022 0226   PHURINE 6.0 07/18/2022 0226   GLUCOSEU NEGATIVE 07/18/2022 0226   HGBUR NEGATIVE 07/18/2022 0226   BILIRUBINUR NEGATIVE 07/18/2022 0226   KETONESUR NEGATIVE 07/18/2022 0226   PROTEINUR NEGATIVE 07/18/2022 0226   NITRITE NEGATIVE 07/18/2022 0226   LEUKOCYTESUR NEGATIVE 07/18/2022 0226   Sepsis Labs: Invalid input(s): "PROCALCITONIN", "LACTICIDVEN"  Microbiology: Recent Results (from the past 240 hour(s))  SARS Coronavirus 2 by RT PCR (hospital order, performed in [facility] hospital lab) *cepheid single result test* Anterior Nasal Swab     Status: None   Collection Time: 02/27/23  7:16 AM   Specimen: Anterior Nasal Swab  Result Value Ref Range Status   SARS Coronavirus 2 by RT PCR NEGATIVE NEGATIVE Final    Comment: Performed at [facility], [address] [place], [place] [address]  MRSA  Next Gen by PCR, Nasal     Status: None   Collection Time: 02/27/23  9:00 AM  Result Value Ref Range Status   MRSA by PCR Next Gen NOT DETECTED NOT DETECTED Final    Comment: (NOTE) The GeneXpert MRSA Assay (FDA approved for NASAL specimens only), is one component of a comprehensive MRSA colonization surveillance program. It is not intended to diagnose MRSA infection nor to guide or monitor treatment for MRSA infections. Test performance is not FDA approved in patients less than 52 years old. Performed at [facility], [address], [place], [place] [address]   Respiratory (~20 pathogens) panel by PCR     Status: None   Collection Time: 02/27/23  9:04 AM   Specimen: Nasopharyngeal Swab; Respiratory  Result Value Ref Range Status   Adenovirus NOT DETECTED NOT DETECTED Final   Coronavirus 229E NOT DETECTED NOT DETECTED Final    Comment: (NOTE) The Coronavirus on the Respiratory Panel, DOES NOT test for the novel  Coronavirus (2019 nCoV)    Coronavirus HKU1 NOT DETECTED NOT DETECTED Final   Coronavirus NL63 NOT DETECTED NOT DETECTED Final   Coronavirus OC43 NOT DETECTED NOT DETECTED Final   Metapneumovirus NOT DETECTED NOT DETECTED Final   Rhinovirus / Enterovirus NOT DETECTED NOT DETECTED Final   Influenza A NOT DETECTED NOT DETECTED Final   Influenza B NOT DETECTED NOT DETECTED Final   Parainfluenza Virus 1 NOT DETECTED NOT DETECTED Final   Parainfluenza Virus 2 NOT DETECTED NOT DETECTED Final   Parainfluenza Virus 3 NOT DETECTED NOT DETECTED Final   Parainfluenza Virus 4 NOT DETECTED NOT DETECTED Final   Respiratory Syncytial Virus NOT DETECTED NOT DETECTED Final   Bordetella pertussis NOT DETECTED NOT DETECTED Final   Bordetella Parapertussis NOT DETECTED NOT DETECTED Final  Chlamydophila pneumoniae NOT DETECTED NOT DETECTED Final   Mycoplasma pneumoniae NOT DETECTED NOT DETECTED Final    Comment: Performed at [facility], [address] [place], [place]  [address]    Radiology Studies: No results found.    Belissa Kooy T. Laural Eiland [facility]  If 7PM-7AM, please contact night-coverage [email or web address] 02/28/2023, 2:27 PM

## 2023-02-28 NOTE — Evaluation (Addendum)
Occupational Therapy Evaluation Patient Details Name: Eddie Graham MRN: [id number] DOB: March 30, 1935 Today's Date: 02/28/2023   History of Present Illness 88 y/o male presents to [facility] 10/31 with difficulty catching his breath, abdominal pain, and weight gain. Admitted w/ L pleural effusion and PNA. PMH: CAD, COPD, HTN, AAA, DM, neuropathy, CKD, depression, back pain, anemia, a-fib/flutter, colostomy   Clinical Impression   PTA, pt reports living at home alone. Pt reports he was independent with ADL/IADL and functional mobility with use of spc and scooter. Pt currently requires contact guard assistance at RW level during functional mobility and grooming while standing at sink level. He demonstrates decreased activity tolerance, cardiopulmonary limitations, instability and cognitive limitations impacting safety and independence with ADL/IADL and mobility. Pt on 1lnc throughout session, SpO2 86% with exertion on 1lnc, he required 2lnc and seated rest break to rebound >90%. Will continue to follow acutely and progress appropriately to maximize safe d/c home. Pt would benefit from pulmonary rehab following d/c.       If plan is discharge home, recommend the following: A little help with walking and/or transfers    Functional Status Assessment  Patient has had a recent decline in their functional status and demonstrates the ability to make significant improvements in function in a reasonable and predictable amount of time.  Equipment Recommendations  None recommended by OT    Recommendations for Other Services       Precautions / Restrictions Precautions Precautions: Fall Restrictions Weight Bearing Restrictions: No      Mobility Bed Mobility               General bed mobility comments: pt sitting EOB upon arrival    Transfers Overall transfer level: Needs assistance Equipment used: None Transfers: Sit to/from Stand Sit to Stand: Supervision            General transfer comment: supervision for safety      Balance Overall balance assessment: Needs assistance, Mild deficits observed, not formally tested Sitting-balance support: No upper extremity supported, Feet supported Sitting balance-Leahy Scale: Good     Standing balance support: Single extremity supported, During functional activity, Reliant on assistive device for balance Standing balance-Leahy Scale: Fair Standing balance comment: able to stand w/ no AD, prefers UE support when ambulating                           ADL either performed or assessed with clinical judgement   ADL Overall ADL's : Needs assistance/impaired Eating/Feeding: Independent   Grooming: Supervision/safety;Standing   Upper Body Bathing: Modified independent   Lower Body Bathing: Supervison/ safety   Upper Body Dressing : Modified independent   Lower Body Dressing: Supervision/safety;Sit to/from stand   Toilet Transfer: Contact guard assist   Toileting- Clothing Manipulation and Hygiene: Supervision/safety       Functional mobility during ADLs: Contact guard assist;Rolling walker (2 wheels) General ADL Comments: impulsive, cga for safety with line management, cues for therapist prompted tasks     Vision         Perception         Praxis         Pertinent Vitals/Pain Pain Assessment Pain Assessment: No/denies pain Pain Intervention(s): Monitored during session     Extremity/Trunk Assessment Upper Extremity Assessment Upper Extremity Assessment: Overall WFL for tasks assessed   Lower Extremity Assessment Lower Extremity Assessment: Overall WFL for tasks assessed   Cervical / Trunk Assessment Cervical / Trunk Assessment: Normal  Communication Communication Communication: No apparent difficulties;Other (comment) (spanish is primary language, able to understand basic english)   Cognition Arousal: Alert Behavior During Therapy: WFL for tasks  assessed/performed Overall Cognitive Status: No family/caregiver present to determine baseline cognitive functioning                                 General Comments: pt impulsive and with tangential thoughts throughout session. Using broken english and interpreter resulted in challenging clarity with attempts to assess cognition. pt requiring frequent cues for sequencing of therapist prompted tasks. appears highly distractable     General Comments       Exercises     Shoulder Instructions      Home Living Family/patient expects to be discharged to:: Private residence Living Arrangements: Alone Available Help at Discharge: Friend(s);Available PRN/intermittently Type of Home: Apartment Home Access: Level entry     Home Layout: One level     Bathroom Shower/Tub: Chief Strategy Officer: Standard Bathroom Accessibility: Yes   Home Equipment: Agricultural consultant (2 wheels);Cane - single point;Electric scooter          Prior Functioning/Environment Prior Level of Function : Independent/Modified Independent             Mobility Comments: indepenent with cane and electric scooter at times ADLs Comments: Independent w/ ADLs        OT Problem List: Decreased activity tolerance;Impaired balance (sitting and/or standing);Cardiopulmonary status limiting activity      OT Treatment/Interventions: Self-care/ADL training;Therapeutic exercise;Energy conservation;DME and/or AE instruction;Balance training;Patient/family education    OT Goals(Current goals can be found in the care plan section) Acute Rehab OT Goals Patient Stated Goal: to breathe better OT Goal Formulation: With patient Time For Goal Achievement: 03/14/23 Potential to Achieve Goals: Good ADL Goals Pt Will Perform Lower Body Dressing: with modified independence;sit to/from stand Pt Will Transfer to Toilet: with modified independence;ambulating Additional ADL Goal #1: Pt will demonstrate  anticipatory awareness during ADL/IADL and functional mobility.  OT Frequency: Min 2X/week    Co-evaluation              AM-PAC OT "6 Clicks" Daily Activity     Outcome Measure Help from another person eating meals?: None Help from another person taking care of personal grooming?: A Little Help from another person toileting, which includes using toliet, bedpan, or urinal?: A Little Help from another person bathing (including washing, rinsing, drying)?: A Little Help from another person to put on and taking off regular upper body clothing?: None Help from another person to put on and taking off regular lower body clothing?: A Little 6 Click Score: 20   End of Session Equipment Utilized During Treatment: Rolling walker (2 wheels);Oxygen Nurse Communication: Mobility status;Other (comment) (O2)  Activity Tolerance: Patient tolerated treatment well Patient left: in bed;with call bell/phone within reach  OT Visit Diagnosis: Unsteadiness on feet (R26.81);Other abnormalities of gait and mobility (R26.89)                Time: 6387-5643 OT Time Calculation (min): 20 min Charges:  OT General Charges $OT Visit: 1 Visit OT Evaluation $OT Eval Low Complexity: 1 Low  Shahara Hartsfield OTR/L Acute Rehabilitation Services Office: [phone]   Providence Crosby 02/28/2023, 1:52 PM

## 2023-02-28 NOTE — Progress Notes (Signed)
Mobility Specialist Progress Note:  Nurse requested Mobility Specialist to perform oxygen saturation test with pt which includes removing pt from oxygen both at rest and while ambulating.  Below are the results from that testing.     Patient Saturations on Room Air at Rest = spO2 95%  Patient Saturations on Room Air while Ambulating = sp02 89% .  Rested and performed pursed lip breathing for 1 minute with sp02 at 91%.  At end of testing pt left in room on 0  Liters of oxygen.  Reported results to nurse.    Thompson Grayer Mobility Specialist  Please contact vis Secure Chat or  Rehab Office [phone]

## 2023-02-28 NOTE — Progress Notes (Signed)
MD, pt went to the bathroom w/o O2, and was in the bathroom for about 15 minutes, when pt walked out of the bathroom he was extremely SOB, I was not able to read his O2 sat on RA, due to putting him back on O2 due to the SOB, but shortly after getting his breath back his O2 sat went to 100%.  I will continue to wean him off O2 and lower it back to 1 L here soon, Thanks Glenna Fellows.

## 2023-03-01 ENCOUNTER — Other Ambulatory Visit (HOSPITAL_COMMUNITY): Payer: Self-pay

## 2023-03-01 DIAGNOSIS — J9601 Acute respiratory failure with hypoxia: Secondary | ICD-10-CM

## 2023-03-01 DIAGNOSIS — J441 Chronic obstructive pulmonary disease with (acute) exacerbation: Secondary | ICD-10-CM | POA: Diagnosis not present

## 2023-03-01 DIAGNOSIS — J159 Unspecified bacterial pneumonia: Secondary | ICD-10-CM | POA: Diagnosis not present

## 2023-03-01 LAB — RENAL FUNCTION PANEL
Albumin: 3.3 g/dL — ABNORMAL LOW (ref 3.5–5.0)
Anion gap: 10 (ref 5–15)
BUN: 37 mg/dL — ABNORMAL HIGH (ref 8–23)
CO2: 23 mmol/L (ref 22–32)
Calcium: 8.7 mg/dL — ABNORMAL LOW (ref 8.9–10.3)
Chloride: 101 mmol/L (ref 98–111)
Creatinine, Ser: 1.85 mg/dL — ABNORMAL HIGH (ref 0.61–1.24)
GFR, Estimated: 35 mL/min — ABNORMAL LOW (ref >60)
Glucose, Bld: 128 mg/dL — ABNORMAL HIGH (ref 70–99)
Phosphorus: 3 mg/dL (ref 2.5–4.6)
Potassium: 4.8 mmol/L (ref 3.5–5.1)
Sodium: 134 mmol/L — ABNORMAL LOW (ref 135–145)

## 2023-03-01 LAB — CBC
HCT: 44.5 % (ref 39.0–52.0)
Hemoglobin: 14.2 g/dL (ref 13.0–17.0)
MCH: 28.9 pg (ref 26.0–34.0)
MCHC: 31.9 g/dL (ref 30.0–36.0)
MCV: 90.4 fL (ref 80.0–100.0)
Platelets: 234 10*3/uL (ref 150–400)
RBC: 4.92 MIL/uL (ref 4.22–5.81)
RDW: 14.6 % (ref 11.5–15.5)
WBC: 10.5 10*3/uL (ref 4.0–10.5)
nRBC: 0 % (ref 0.0–0.2)

## 2023-03-01 LAB — GLUCOSE, CAPILLARY
Glucose-Capillary: 169 mg/dL — ABNORMAL HIGH (ref 70–99)
Glucose-Capillary: 163 mg/dL — ABNORMAL HIGH (ref 70–99)

## 2023-03-01 LAB — MAGNESIUM: Magnesium: 2.5 mg/dL — ABNORMAL HIGH (ref 1.7–2.4)

## 2023-03-01 MED ORDER — PREDNISONE 50 MG PO TABS
50.0000 mg | ORAL_TABLET | Freq: Every day | ORAL | 0 refills | Status: AC
Start: 2023-03-02 — End: 2023-03-06
  Filled 2023-03-01: qty 4, 4d supply, fill #0

## 2023-03-01 MED ORDER — FUROSEMIDE 20 MG PO TABS
20.0000 mg | ORAL_TABLET | Freq: Every day | ORAL | 0 refills | Status: DC
  Filled 2023-03-01: qty 30, 30d supply, fill #0

## 2023-03-01 MED ORDER — TRAMADOL HCL 50 MG PO TABS
50.0000 mg | ORAL_TABLET | Freq: Four times a day (QID) | ORAL | 0 refills | Status: AC | PRN
  Filled 2023-03-01: qty 20, 5d supply, fill #0

## 2023-03-01 MED ORDER — EMPAGLIFLOZIN 10 MG PO TABS
10.0000 mg | ORAL_TABLET | Freq: Every day | ORAL | 1 refills | Status: AC
  Filled 2023-03-01: qty 90, 90d supply, fill #0

## 2023-03-01 MED ORDER — EMPAGLIFLOZIN 10 MG PO TABS
10.0000 mg | ORAL_TABLET | Freq: Every day | ORAL | Status: DC
  Administered 2023-03-01: 10 mg via ORAL
  Filled 2023-03-01: qty 1

## 2023-03-01 NOTE — Progress Notes (Signed)
Occupational Therapy Treatment Patient Details Name: Eddie Graham MRN: [id number] DOB: 1935-03-11 Today's Date: 03/01/2023   History of present illness 88 y/o male presents to [facility] 10/31 with difficulty catching his breath, abdominal pain, and weight gain. Admitted w/ L pleural effusion and PNA. PMH: CAD, COPD, HTN, AAA, DM, neuropathy, CKD, depression, back pain, anemia, a-fib/flutter, colostomy   OT comments  Focused session on dressing tasks and O2 line mgmt in prep for DC home today. Pt requires cues for safety and cues/physical assistance to manage O2 lines during dressing tasks w/ continued reinforcement needed with HH services at DC. Would also recommend initial assist with med mgmt and cooking tasks to maximize safety.      If plan is discharge home, recommend the following:  Direct supervision/assist for medications management;Direct supervision/assist for financial management;Assistance with cooking/housework   Equipment Recommendations  None recommended by OT    Recommendations for Other Services      Precautions / Restrictions Precautions Precautions: Fall Precaution Comments: monitor O2, colostomy Restrictions Weight Bearing Restrictions: No       Mobility Bed Mobility               General bed mobility comments: EOB on entry    Transfers Overall transfer level: Modified independent Equipment used: None Transfers: Sit to/from Stand Sit to Stand: Modified independent (Device/Increase time)                 Balance Overall balance assessment: No apparent balance deficits (not formally assessed)                                         ADL either performed or assessed with clinical judgement   ADL Overall ADL's : Needs assistance/impaired                 Upper Body Dressing : Supervision/safety;Sitting Upper Body Dressing Details (indicate cue type and reason): cued to be mindful of O2 line when donning shirt  though pt donned shirt over O2 line w/ poor awareness of how to correct requiring OT assist Lower Body Dressing: Supervision/safety;Sit to/from stand Lower Body Dressing Details (indicate cue type and reason): cues to attempt donning pants seated next time to decrease fall risk as pt standing to step into pants legs with hand on rolling tray table.                    Extremity/Trunk Assessment Upper Extremity Assessment Upper Extremity Assessment: Overall WFL for tasks assessed;Right hand dominant   Lower Extremity Assessment Lower Extremity Assessment: Defer to PT evaluation        Vision   Vision Assessment?: No apparent visual deficits   Perception     Praxis      Cognition Arousal: Alert Behavior During Therapy: WFL for tasks assessed/performed Overall Cognitive Status: No family/caregiver present to determine baseline cognitive functioning                                 General Comments: attention deficits, difficulty problem solving O2 line mgmt with ADLs requiring frequent cues        Exercises      Shoulder Instructions       General Comments In person interpreter present to assist with translating. Nursing staff entering to provide DC instructions w/ assist from interpreter  Pertinent Vitals/ Pain       Pain Assessment Pain Assessment: No/denies pain  Home Living   Living Arrangements: Alone                                      Prior Functioning/Environment              Frequency  Min 1X/week        Progress Toward Goals  OT Goals(current goals can now be found in the care plan section)  Progress towards OT goals: Progressing toward goals  Acute Rehab OT Goals Patient Stated Goal: home today OT Goal Formulation: With patient Time For Goal Achievement: 03/14/23 Potential to Achieve Goals: Good ADL Goals Pt Will Perform Lower Body Dressing: with modified independence;sit to/from stand Pt Will  Transfer to Toilet: with modified independence;ambulating Additional ADL Goal #1: Pt will demonstrate anticipatory awareness during ADL/IADL and functional mobility.  Plan      Co-evaluation                 AM-PAC OT "6 Clicks" Daily Activity     Outcome Measure   Help from another person eating meals?: None Help from another person taking care of personal grooming?: A Little Help from another person toileting, which includes using toliet, bedpan, or urinal?: A Little Help from another person bathing (including washing, rinsing, drying)?: A Little Help from another person to put on and taking off regular upper body clothing?: None Help from another person to put on and taking off regular lower body clothing?: A Little 6 Click Score: 20    End of Session Equipment Utilized During Treatment: Oxygen  OT Visit Diagnosis: Unsteadiness on feet (R26.81);Other abnormalities of gait and mobility (R26.89)   Activity Tolerance Patient tolerated treatment well   Patient Left in bed;with nursing/sitter in room   Nurse Communication          Time: 0981-1914 OT Time Calculation (min): 12 min  Charges: OT General Charges $OT Visit: 1 Visit OT Treatments $Self Care/Home Management : 8-22 mins  Bradd Canary, OTR/L Acute Rehab Services Office: [phone]   Lorre Munroe 03/01/2023, 11:51 AM

## 2023-03-01 NOTE — Care Management Important Message (Signed)
Important Message  Patient Details  Name: Eddie Graham MRN: [id number] Date of Birth: October 18, 1934   Important Message Given:    PT'S HAS DISCHARGE. IM MAILED    Mardene Sayer 03/01/2023, 3:11 PM

## 2023-03-01 NOTE — Progress Notes (Signed)
SATURATION QUALIFICATIONS: (This note is used to comply with regulatory documentation for home oxygen)  Patient Saturations on Room Air at Rest = 96%  Patient Saturations on Room Air while Ambulating = 87%  Patient Saturations on 2 Liters of oxygen while Ambulating = 93%  Please briefly explain why patient needs home oxygen: Patient saturations decrease while ambulating, DOE, Patient verbalizes complaints that he cannot breath

## 2023-03-01 NOTE — Progress Notes (Signed)
Patient was taken off O2 briefly SATs at rest was 85%.. Pt was lying quietly in bed. Same reported to Dr Alanda Slim and another walk test will be done.

## 2023-03-01 NOTE — TOC Transition Note (Addendum)
Transition of Care [facility] - CM/SW Discharge Note   Patient Details  Name: Eddie Graham MRN: [id number] Date of Birth: June 04, 1934  [facility] CM/SW Contact:  Leone Haven, RN Phone Number: 03/01/2023, 10:58 AM   Clinical Narrative:    For dc today, [employer] to supply home oxygen for patient he would like to have OP therapy on [facility]. NCM will make referral thru epic. He has transportation.   Final next level of care: Home/Self Care Barriers to Discharge: Continued Medical Work up   Patient Goals and CMS Choice   Choice offered to / list presented to : NA  Discharge Placement                         Discharge Plan and Services Additional resources added to the After Visit Summary for     Discharge Planning Services: CM Consult Post Acute Care Choice: NA          DME Arranged: N/A DME Agency: NA       HH Arranged: NA          Social Determinants of Health (SDOH) Interventions SDOH Screenings   Food Insecurity: No Food Insecurity (02/25/2023)  Housing: High Risk (03/01/2023)  Transportation Needs: No Transportation Needs (02/25/2023)  Utilities: Not At Risk (02/25/2023)  Financial Resource Strain: Low Risk  (08/29/2021)  Tobacco Use: Low Risk  (02/25/2023)     Readmission Risk Interventions     No data to display

## 2023-03-01 NOTE — Plan of Care (Signed)
  Problem: Clinical Measurements: Goal: Respiratory complications will improve Outcome: Adequate for Discharge   Problem: Clinical Measurements: Goal: Cardiovascular complication will be avoided Outcome: Adequate for Discharge   Problem: Activity: Goal: Risk for activity intolerance will decrease Outcome: Adequate for Discharge   Problem: Coping: Goal: Level of anxiety will decrease Outcome: Adequate for Discharge   Problem: Pain Management: Goal: General experience of comfort will improve Outcome: Adequate for Discharge

## 2023-03-01 NOTE — Care Management Important Message (Signed)
Important Message  Patient Details  Name: Eddie Graham MRN: [id number] Date of Birth: Mar 23, 1935   Important Message Given:     Patient left prior to IM delivery will mail a copy to the patient home address.   Keighley Deckman 03/01/2023, 12:59 PM

## 2023-03-01 NOTE — Discharge Summary (Signed)
Physician Discharge Summary  Eddie Graham WUJ:[id number] DOB: 11/22/1934 DOA: 02/25/2023  PCP: Andreas Blower., MD  Admit date: 02/25/2023 Discharge date: 03/01/2023 Admitted From: Home Disposition: Home Recommendations for Outpatient Follow-up:  Outpatient follow-up with PCP as below  Check CMP and CBC at follow-up Reassess need for ongoing oxygen use Needs a liver MRI for hepatic lesion noted on CT Please follow up on the following pending results: None  Home Health: HH PT/OT/RN Equipment/Devices: Home oxygen, 2 L by nasal cannula  Discharge Condition: Stable CODE STATUS: Full code  Follow-up Information     Andreas Blower., MD. Go on 03/15/2023.   Specialty: Internal Medicine Why: @2 :15pm please arrive 15 minutes early and bring insurance card Contact information: [address] [place] [address] [phone]         [employer] Follow up.   Why: home oxygen Contact information: [phone]        [facility] Follow up.   Specialty: Rehabilitation Why: They will call you to set up apt times, please call them if you have not heard from them in 3 business days. thanks Contact information: [address] 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  He has chronic abdominal pain since his colectomy and colostomy.  Recently hospitalized from 10/1-10/4 for A-fib/flutter with RVR, chest and abdominal pain.  He underwent TURP bed on 10/15 outpatient.    In ED, febrile.  No leukocytosis.  BNP  elevated to 252.  Troponin 22>> 23.  CXR raise concern for left retrocardiac opacity and blunting of left lateral costophrenic angle.  CT abdomen and pelvis without acute finding but 3 cm infrarenal AAA and 1.3 cm enhancing lesion in his liver.  Patient was started on IV Lasix as well as broad-spectrum antibiotics for pneumonia due to his recent hospitalization.   Unfortunately, he developed AKI with diuretics.  Continues to report abdominal pain radiating to his left chest, shortness of breath and cough.  MRSA PCR, COVID-19 and full RVP nonreactive.  Antibiotic de-escalated to ceftriaxone and Zithromax.  Also started on systemic steroid in addition to breathing treatments.  On the day of discharge, AKI improved.  Ambulated on room air and desaturated to 87% requiring 2 L to recover to 90s.  He is discharged on p.o. prednisone for 3 more days.  He is already optimized on breathing treatments.   Patient's glipizide and Actos discontinued.  He has been started on Jardiance in addition to home insulin.  See individual problem list below for more.   Problems addressed during this hospitalization Acute hypoxic respiratory failure:  Most likely due to COPD exacerbation.  Possible pneumonia.  Desaturated to 87% with ambulation on room air requiring 2 L to recover to mid 90s.  Discharged on 2 L by nasal cannula. -Received Solu-Medrol for 3 days and discharged on p.o. prednisone for 3 more days. -Completed antibiotic course for possible pneumonia -Continue Anoro Ellipta with rescue inhalers.   Possible left lung pneumonia: Patient presents with SOB, edema and chronic abdominal pain radiating to left  chest..  He has no fever or leukocytosis.  Pro-Cal persistently negative.  Chest x-ray raises CXR raise concern for left retrocardiac opacity and blunting of left lateral costophrenic angle.  He has history of CHF but appears euvolemic on exam.  COVID-19, MRSA PCR and full RVP nonreactive.  Some improvement in his  breathing after steroid. -Received 4 days of antibiotics.   COPD exacerbation: Likely contributing to his symptoms. -Management as above.   Acute on chronic HFmrEF: TTE on 10/2 with LVEF of 40 to 45%, global hypokinesis and indeterminate DD.  On p.o. Lasix 20 mg daily at home.  Presented with shortness of breath and edema.  Started on IV Lasix but developed AKI.  Appears euvolemic. -Started Jardiance on discharge.   Atrial flutter: Rate controlled.  On metoprolol and Eliquis at home. -Continue home meds.   Uncontrolled IDDM-2 with hyperglycemia, neuropathy and CK-3B: A1c 7.4% on 9/25. -Continue home insulin and Rybelsus -Discontinue Actos and start Jardiance given history of CHF -Discontinue glipizide given risk for hypoglycemia with concurrent use of insulin -Reassess glycemic control and adjust meds as appropriate   AKI on CKD-3B: Baseline Cr ~1.6-1.7.  AKI likely due to diuretics.  Improved. -Recheck renal function in 1 week  History of siogmoid abscess s/p end colostomy -Colostomy care   History of bladder cancer s/p TURB on 10/15 -Outpatient follow-up   Elevated troponins: Mild without significant delta. -No further workup needed   Hepatic lesion: CT abdomen and pelvis showed 1.3 cm hepatic lesion.  Discussed with patient. -Needs MRI outpatient   Infrarenal AAA-3 cm on CT -outpatient follow-up   Chronic abdominal and left chest pain: Chronic since colostomy.  Denies change.  CT abdomen and pelvis without acute finding.  Left chest pain reproducible with palpation. -Continue home meds   Hyperlipidemia -Continue Crestor   BPH -continue Flomax   Obesity Body mass index is 34.2 kg/m.           Time spent 45 minutes  Vital signs Vitals:   03/01/23 0434 03/01/23 0739 03/01/23 0804 03/01/23 0816  BP: 114/88 115/75 115/75   Pulse: 96 (!) 102 (!) 102   Temp: 98.7 F (37.1 C) 98 F (36.7 C)    Resp: 18 16    Height:      Weight: 96.2 kg     SpO2: 93% 97%   93%  TempSrc: Oral Oral    BMI (Calculated): 34.25        Discharge exam  GENERAL: No apparent distress.  Nontoxic. HEENT: MMM.  Vision and hearing grossly intact.  NECK: Supple.  No apparent JVD.  RESP:  No IWOB.  Fair aeration bilaterally. CVS:  RRR. Heart sounds normal.  ABD/GI/GU: BS+. Abd soft, NTND.  Colostomy bag in place. MSK/EXT:  Moves extremities. No apparent deformity. No edema.  SKIN: no apparent skin lesion or wound NEURO: Awake and alert. Oriented appropriately.  No apparent focal neuro deficit. PSYCH: Calm. Normal affect.   Discharge Instructions Discharge Instructions     Diet - low sodium heart healthy   Complete by: As directed    Diet Carb Modified   Complete by: As directed    Discharge instructions   Complete by: As directed    It has been a pleasure taking care of you!  You were hospitalized due to shortness of breath and abdominal pain.  Your breathing improved with steroid and breathing treatments.  We are discharging you on more steroid and breathing treatments to complete treatment course.  We  have also made some changes to your medication during this hospitalization.  Please review your new medication list and the directions on your medications before you take them.  Follow-up with your primary care doctor in 1 to 2 weeks or sooner if needed.   Take care,   Increase activity slowly   Complete by: As directed       Allergies as of 03/01/2023   No Known Allergies      Medication List     STOP taking these medications    glipiZIDE 10 MG tablet Commonly known as: GLUCOTROL   pioglitazone 30 MG tablet Commonly known as: ACTOS       TAKE these medications    acetaminophen 650 MG CR tablet Commonly known as: TYLENOL Take 650 mg by mouth every 8 (eight) hours as needed for pain.   albuterol 108 (90 Base) MCG/ACT inhaler Commonly known as: VENTOLIN HFA Inhale 2 puffs into the lungs every 4 (four) hours as needed for wheezing.    Anoro Ellipta 62.5-25 MCG/ACT Aepb Generic drug: umeclidinium-vilanterol Inhale 1 puff into the lungs daily.   apixaban 2.5 MG Tabs tablet Commonly known as: ELIQUIS Take 1 tablet (2.5 mg total) by mouth 2 (two) times daily.   DULoxetine 60 MG capsule Commonly known as: CYMBALTA Take 30-60 mg by mouth daily.   EYE DROPS OP Place 2 drops into both eyes as needed (dryness/itching).   ferrous sulfate 325 (65 FE) MG tablet Take 325 mg by mouth daily with breakfast.   furosemide 20 MG tablet Commonly known as: LASIX Tome 1 tableta (20 mg en total) por va oral diariamente. (Take 1 tablet (20 mg total) by mouth daily.) Start taking on: March 08, 2023 What changed: These instructions start on March 08, 2023. If you are unsure what to do until then, ask your doctor or other care provider.   gabapentin 100 MG capsule Commonly known as: NEURONTIN Take 100-300 mg by mouth at bedtime as needed (leg pain).   Jardiance 10 MG Tabs tablet Generic drug: empagliflozin Tome 1 tableta (10 mg en total) por va oral diariamente antes de desayunar. (Take 1 tablet (10 mg total) by mouth daily before breakfast.)   Lantus SoloStar 100 UNIT/ML Solostar Pen Generic drug: insulin glargine Inject 15 Units into the skin at bedtime. What changed: how much to take   Melatonin 5 MG Caps Take 5 mg by mouth at bedtime as needed (sleep).   metoprolol succinate 100 MG 24 hr tablet Commonly known as: TOPROL-XL Take 1 tablet (100 mg total) by mouth daily. Take with or immediately following a meal.   montelukast 10 MG tablet Commonly known as: SINGULAIR Take 10 mg by mouth at bedtime.   pantoprazole 40 MG tablet Commonly known as: PROTONIX Tome 1 tableta por va oral 2 veces al da. (Take 1 tablet by mouth 2 times daily.)   polyethylene glycol 17 g packet Commonly known as: MIRALAX / GLYCOLAX Take 17 g by mouth daily as needed for mild constipation.   predniSONE 50 MG tablet Commonly known  as: DELTASONE Take 1 tablet (50 mg total) by mouth daily for 4 days. Start taking on: March 02, 2023   rosuvastatin 40 MG tablet Commonly known as: CRESTOR Take 40 mg by mouth at bedtime.   Semaglutide 14 MG Tabs Take 14 mg by mouth daily.   tamsulosin 0.4 MG Caps capsule Commonly known as: FLOMAX Take 0.4 mg by mouth daily.   TechLite Pen Needles 32G X 4 MM  Misc Generic drug: Insulin Pen Needle Use to inject Lantus at bedtime as directed   traMADol 50 MG tablet Commonly known as: Ultram Take 1 tablet (50 mg total) by mouth every 6 (six) hours as needed.   traZODone 50 MG tablet Commonly known as: DESYREL Take 50 mg by mouth at bedtime.               Durable Medical Equipment  (From admission, onward)           Start     Ordered   03/01/23 1032  For home use only DME oxygen  Once       Question Answer Comment  Length of Need 6 Months   Mode or (Route) Nasal cannula   Liters per Minute 2   Frequency Continuous (stationary and portable oxygen unit needed)   Oxygen delivery system Gas      03/01/23 1031            Consultations: None  Procedures/Studies:   CT ABDOMEN PELVIS W CONTRAST  Result Date: 02/25/2023 CLINICAL DATA:  Abdominal swelling, suspected bowel obstruction EXAM: CT ABDOMEN AND PELVIS WITH CONTRAST TECHNIQUE: Multidetector CT imaging of the abdomen and pelvis was performed using the standard protocol following bolus administration of intravenous contrast. RADIATION DOSE REDUCTION: This exam was performed according to the departmental dose-optimization program which includes automated exposure control, adjustment of the mA and/or kV according to patient size and/or use of iterative reconstruction technique. CONTRAST:  60mL OMNIPAQUE IOHEXOL 350 MG/ML SOLN COMPARISON:  07/18/2022 FINDINGS: Lower chest: Small left pleural effusion with adjacent atelectasis/consolidation at the left lung base, new since previous. Coronary and aortic  calcifications. Hepatobiliary: 1.3 cm enhancing lesion in hepatic segment 5, inconspicuous on the delayed scan and not evident on prior study which had slightly different contrast timing as well. No biliary ductal dilatation. Gallbladder normal. Pancreas: Unremarkable. No pancreatic ductal dilatation or surrounding inflammatory changes. Spleen: Normal in size without focal abnormality. Adrenals/Urinary Tract: No adrenal mass. No hydronephrosis. Stable 1.6 cm 16 HU probable cyst right lower pole; no follow-up recommended. Urinary bladder nondistended. Stomach/Bowel: Stomach incompletely distended, without acute finding. Small bowel decompressed. A loop of small bowel is involved in a left lower quadrant parastomal her hernia, without obstruction or strangulation. Normal appendix. Multiple colonic diverticula, without adjacent inflammatory change. Left lower quadrant colostomy. Decompressed Hartmann's pouch. Vascular/Lymphatic: Extensive calcified aortoiliac atheromatous plaque. Saccular aneurysm from the right posterolateral aspect of the infrarenal aorta, up to 3.3 cm diameter (previously 3.1). Left portal vein patent. No abdominal or pelvic adenopathy. Reproductive: Prostate is unremarkable. Other: No ascites.  No free air. Musculoskeletal: Sternotomy wires. Mild lumbar levoscoliosis, with mild multilevel spondylitic change most marked L5-S1. IMPRESSION: 1. No acute findings. 2. Left lower quadrant parastomal hernia containing a loop of small bowel, without obstruction or strangulation. 3. 3.3 cm infrarenal saccular aortic aneurysm. Recommend referral to or continued care with vascular specialist. (Ref.: J Vasc Surg. 2018; 67:2-77 and J Am Coll Radiol 2013;10(10):789-794.) 4. 1.3 cm enhancing lesion in hepatic segment 5, possible FNH but nonspecific. MR liver with contrast may allow definitive characterization. 5. Small left pleural effusion with adjacent atelectasis/consolidation at the left lung base, new since  previous. 6.  Aortic Atherosclerosis (ICD10-I70.0). Electronically Signed   By: Corlis Leak M.D.   On: 02/25/2023 19:27   DG Chest Portable 1 View  Result Date: 02/25/2023 CLINICAL DATA:  Chest pain.  Shortness of breath. EXAM: PORTABLE CHEST 1 VIEW COMPARISON:  01/28/2023. FINDINGS: There are  left retrocardiac airspace opacities obscuring the left hemidiaphragm, descending thoracic aorta and blunting the left lateral costophrenic angle suggesting combination of left lung atelectasis and/or consolidation with pleural effusion. There are also probable atelectatic changes at the right lung base. Bilateral lung fields are otherwise clear. Right lateral costophrenic angle is clear. Stable cardio-mediastinal silhouette. Sternotomy wires noted. No acute osseous abnormalities. The soft tissues are within normal limits. IMPRESSION: *Left retrocardiac opacity, as discussed above. Follow-up to clearing is recommended. Electronically Signed   By: Jules Schick M.D.   On: 02/25/2023 16:40   DG C-Arm 1-60 Min-No Report  Result Date: 02/09/2023 Fluoroscopy was utilized by the requesting physician.  No radiographic interpretation.       The results of significant diagnostics from this hospitalization (including imaging, microbiology, ancillary and laboratory) are listed below for reference.     Microbiology: Recent Results (from the past 240 hour(s))  SARS Coronavirus 2 by RT PCR (hospital order, performed in [facility] hospital lab) *cepheid single result test* Anterior Nasal Swab     Status: None   Collection Time: 02/27/23  7:16 AM   Specimen: Anterior Nasal Swab  Result Value Ref Range Status   SARS Coronavirus 2 by RT PCR NEGATIVE NEGATIVE Final    Comment: Performed at [facility], [address] [place], [place] [address]  MRSA Next Gen by PCR, Nasal     Status: None   Collection Time: 02/27/23  9:00 AM  Result Value Ref Range Status   MRSA by PCR Next Gen NOT DETECTED NOT DETECTED  Final    Comment: (NOTE) The GeneXpert MRSA Assay (FDA approved for NASAL specimens only), is one component of a comprehensive MRSA colonization surveillance program. It is not intended to diagnose MRSA infection nor to guide or monitor treatment for MRSA infections. Test performance is not FDA approved in patients less than 75 years old. Performed at [facility], [address] [place], [place] [address]   Respiratory (~20 pathogens) panel by PCR     Status: None   Collection Time: 02/27/23  9:04 AM   Specimen: Nasopharyngeal Swab; Respiratory  Result Value Ref Range Status   Adenovirus NOT DETECTED NOT DETECTED Final   Coronavirus 229E NOT DETECTED NOT DETECTED Final    Comment: (NOTE) The Coronavirus on the Respiratory Panel, DOES NOT test for the novel  Coronavirus (2019 nCoV)    Coronavirus HKU1 NOT DETECTED NOT DETECTED Final   Coronavirus NL63 NOT DETECTED NOT DETECTED Final   Coronavirus OC43 NOT DETECTED NOT DETECTED Final   Metapneumovirus NOT DETECTED NOT DETECTED Final   Rhinovirus / Enterovirus NOT DETECTED NOT DETECTED Final   Influenza A NOT DETECTED NOT DETECTED Final   Influenza B NOT DETECTED NOT DETECTED Final   Parainfluenza Virus 1 NOT DETECTED NOT DETECTED Final   Parainfluenza Virus 2 NOT DETECTED NOT DETECTED Final   Parainfluenza Virus 3 NOT DETECTED NOT DETECTED Final   Parainfluenza Virus 4 NOT DETECTED NOT DETECTED Final   Respiratory Syncytial Virus NOT DETECTED NOT DETECTED Final   Bordetella pertussis NOT DETECTED NOT DETECTED Final   Bordetella Parapertussis NOT DETECTED NOT DETECTED Final   Chlamydophila pneumoniae NOT DETECTED NOT DETECTED Final   Mycoplasma pneumoniae NOT DETECTED NOT DETECTED Final    Comment: Performed at [facility], [address] [place], [place] [address]     Labs:  CBC: Recent Labs  Lab 02/25/23 1444 02/26/23 0427 02/27/23 0241 02/28/23 0255 03/01/23 0252  WBC  7.7 8.3 8.3 9.7 10.5  HGB 13.1  13.6 14.3 14.6 14.2  HCT 41.0 42.9 44.4 44.4 44.5  MCV 92.1 91.3 90.1 90.2 90.4  PLT 230 234 258 260 234   BMP &GFR Recent Labs  Lab 02/25/23 1444 02/26/23 0427 02/27/23 0241 02/28/23 0255 03/01/23 0252  NA 138 137 135 132* 134*  K 4.0 4.0 3.9 4.9 4.8  CL 105 100 101 100 101  CO2 23 24 24 22 23   GLUCOSE 62* 90 180* 197* 128*  BUN 17 17 28* 32* 37*  CREATININE 1.61* 1.69* 2.05* 1.83* 1.85*  CALCIUM 8.8* 8.6* 8.5* 8.7* 8.7*  MG  --   --  2.3 2.5* 2.5*  PHOS  --   --   --  4.5 3.0   Estimated Creatinine Clearance: 31.1 mL/min (A) (by C-G formula based on SCr of 1.85 mg/dL (H)). Liver & Pancreas: Recent Labs  Lab 02/27/23 0241 02/28/23 0255 03/01/23 0252  AST 15  --   --   ALT 14  --   --   ALKPHOS 70  --   --   BILITOT 1.2  --   --   PROT 6.5  --   --   ALBUMIN 3.0* 3.3* 3.3*   No results for input(s): "LIPASE", "AMYLASE" in the last 168 hours. No results for input(s): "AMMONIA" in the last 168 hours. Diabetic: No results for input(s): "HGBA1C" in the last 72 hours. Recent Labs  Lab 02/28/23 1117 02/28/23 1538 02/28/23 2135 03/01/23 0615 03/01/23 1053  GLUCAP 167* 154* 159* 169* 163*   Cardiac Enzymes: No results for input(s): "CKTOTAL", "CKMB", "CKMBINDEX", "TROPONINI" in the last 168 hours. No results for input(s): "PROBNP" in the last 8760 hours. Coagulation Profile: No results for input(s): "INR", "PROTIME" in the last 168 hours. Thyroid Function Tests: No results for input(s): "TSH", "T4TOTAL", "FREET4", "T3FREE", "THYROIDAB" in the last 72 hours. Lipid Profile: No results for input(s): "CHOL", "HDL", "LDLCALC", "TRIG", "CHOLHDL", "LDLDIRECT" in the last 72 hours. Anemia Panel: No results for input(s): "VITAMINB12", "FOLATE", "FERRITIN", "TIBC", "IRON", "RETICCTPCT" in the last 72 hours. Urine analysis:    Component Value Date/Time   COLORURINE STRAW (A) 07/18/2022 0226   APPEARANCEUR CLEAR 07/18/2022 0226   LABSPEC 1.036 (H) 07/18/2022 0226    PHURINE 6.0 07/18/2022 0226   GLUCOSEU NEGATIVE 07/18/2022 0226   HGBUR NEGATIVE 07/18/2022 0226   BILIRUBINUR NEGATIVE 07/18/2022 0226   KETONESUR NEGATIVE 07/18/2022 0226   PROTEINUR NEGATIVE 07/18/2022 0226   NITRITE NEGATIVE 07/18/2022 0226   LEUKOCYTESUR NEGATIVE 07/18/2022 0226   Sepsis Labs: Invalid input(s): "PROCALCITONIN", "LACTICIDVEN"   SIGNED:  Almon Hercules, MD  [facility] 03/01/2023, 3:45 PM

## 2023-03-02 ENCOUNTER — Ambulatory Visit: Payer: 59 | Attending: Physician Assistant | Admitting: Physician Assistant

## 2023-03-02 ENCOUNTER — Encounter: Payer: Self-pay | Admitting: Physician Assistant

## 2023-03-02 VITALS — BP 110/74 | HR 79 | Ht 66.0 in | Wt 213.0 lb

## 2023-03-02 DIAGNOSIS — I25709 Atherosclerosis of coronary artery bypass graft(s), unspecified, with unspecified angina pectoris: Secondary | ICD-10-CM | POA: Diagnosis not present

## 2023-03-02 DIAGNOSIS — C679 Malignant neoplasm of bladder, unspecified: Secondary | ICD-10-CM | POA: Diagnosis not present

## 2023-03-02 DIAGNOSIS — K769 Liver disease, unspecified: Secondary | ICD-10-CM

## 2023-03-02 DIAGNOSIS — N183 Chronic kidney disease, stage 3 unspecified: Secondary | ICD-10-CM

## 2023-03-02 DIAGNOSIS — I4892 Unspecified atrial flutter: Secondary | ICD-10-CM | POA: Diagnosis not present

## 2023-03-02 DIAGNOSIS — I10 Essential (primary) hypertension: Secondary | ICD-10-CM

## 2023-03-02 DIAGNOSIS — E785 Hyperlipidemia, unspecified: Secondary | ICD-10-CM

## 2023-03-02 DIAGNOSIS — R079 Chest pain, unspecified: Secondary | ICD-10-CM | POA: Diagnosis not present

## 2023-03-02 NOTE — Patient Instructions (Addendum)
Medication Instructions:  NO CHANGES *If you need a refill on your cardiac medications before your next appointment, please call your pharmacy*   Lab Work: NO LABS If you have labs (blood work) drawn today and your tests are completely normal, you will receive your results only by: MyChart Message (if you have MyChart) OR A paper copy in the mail If you have any lab test that is abnormal or we need to change your treatment, we will call you to review the results.   Testing/Procedures: [address]  How to Prepare for Your Cardiac PET/CT Stress Test:  1. Please do not take these medications before your test:   Medications that may interfere with the cardiac pharmacological stress agent (ex. nitrates - including erectile dysfunction medications, isosorbide mononitrate- [please start to hold this medication the day before the test], tamulosin or beta-blockers) the day of the exam. (Erectile dysfunction medication should be held for at least 72 hrs prior to test) Theophylline containing medications for 12 hours. Dipyridamole 48 hours prior to the test. Your remaining medications may be taken with water.  2. Nothing to eat or drink, except water, 3 hours prior to arrival time.   NO caffeine/decaffeinated products, or chocolate 12 hours prior to arrival.  3. NO perfume, cologne or lotion on chest or abdomen area.          - FEMALES - Please avoid wearing dresses to this appointment.  4. Total time is 1 to 2 hours; you may want to bring reading material for the waiting time.  5. Please report to Radiology at the [facility] Main Entrance 30 minutes early for your test.  [address] [place], [place] [address]  Diabetic Preparation:  Hold oral medications. You may take NPH and Lantus insulin. Do not take Humalog or Humulin R (Regular Insulin) the day of your test. Check blood sugars prior to leaving the house. If able to eat breakfast prior to 3 hour fasting,  you may take all medications, including your insulin, Do not worry if you miss your breakfast dose of insulin - start at your next meal. Patients who wear a continuous glucose monitor MUST remove the device prior to scanning.  In preparation for your appointment, medication and supplies will be purchased.  Appointment availability is limited, so if you need to cancel or reschedule, please call the Radiology Department at [phone] [facility]) 24 hours in advance to avoid a cancellation fee of $100.00  What to Expect After you Arrive:  Once you arrive and check in for your appointment, you will be taken to a preparation room within the Radiology Department.  A technologist or Nurse will obtain your medical history, verify that you are correctly prepped for the exam, and explain the procedure.  Afterwards,  an IV will be started in your arm and electrodes will be placed on your skin for EKG monitoring during the stress portion of the exam. Then you will be escorted to the PET/CT scanner.  There, staff will get you positioned on the scanner and obtain a blood pressure and EKG.  During the exam, you will continue to be connected to the EKG and blood pressure machines.  A small, safe amount of a radioactive tracer will be injected in your IV to obtain a series of pictures of your heart along with an injection of a stress agent.    After your Exam:  It is recommended that you eat a meal and drink a  caffeinated beverage to counter act any effects of the stress agent.  Drink plenty of fluids for the remainder of the day and urinate frequently for the first couple of hours after the exam.  Your doctor will inform you of your test results within 7-10 business days.  For more information and frequently asked questions, please visit our website : [email or web address]  For questions about your test or how to prepare for your test, please call: Cardiac Imaging Nurse Navigators Office:  [phone]    Follow-Up: At [facility], you and your health needs are our priority.  As part of our continuing mission to provide you with exceptional heart care, we have created designated Provider Care Teams.  These Care Teams include your primary Cardiologist (physician) and Advanced Practice Providers (APPs -  Physician Assistants and Nurse Practitioners) who all work together to provide you with the care you need, when you need it.  We recommend signing up for the patient portal called "MyChart".  Sign up information is provided on this After Visit Summary.  MyChart is used to connect with patients for Virtual Visits (Telemedicine).  Patients are able to view lab/test results, encounter notes, upcoming appointments, etc.  Non-urgent messages can be sent to your provider as well.   To learn more about what you can do with MyChart, go to [email or web address].    Your next appointment:   3-4 week(s) after stress test  Provider:   Azalee Course, PA

## 2023-03-02 NOTE — Progress Notes (Signed)
Cardiology Office Note:  .   Date:  03/02/2023  ID:  Eddie Graham, DOB [birth year], MRN [id number] PCP: Andreas Blower., MD  [facility] Providers Cardiologist:  Rollene Rotunda, MD     History of Present Illness: .   Eddie Graham is a 88 y.o. male with PMH of CAD s/p CABG 2011, chronic diverticulitis, urethral stricture, high-grade bladder cancer, anemia, AAA, COPD, CKD, hypertension and hyperlipidemia.  He has not been followed by cardiology service since he moved to [place] from [place] in 2017.  He has been followed by [facility] urology service for history of bladder cancer.     He was diagnosed with bladder cancer in 2019 and underwent TURBT at that time.  He had gross hematuria and underwent a urethral balloon dilatation and TRUBT in June 2023.  He also underwent a cystoscopy with urethral dilatation in August 2023.  He had resection of the perforated intramural sigmoid colonic abscess in January 2024 with end colostomy.  He was admitted at [facility] in February 2024 with acute GI bleed.  GI was consulted and did not feel patient required colonoscopy and suspected bleeding was either postop bleed versus diverticulitis.  His bleeding stopped during the hospitalization.  He was seen by urology service in September to schedule surveillance cystoscopy.  He had microscopic hematuria at the time.  Urology service was unable to transversed the scope through a urethral stricture.  He was scheduled to undergo diagnostic cystoscopy with possible TURP and urethral dilatation on 10/1.     When he presented for his procedure, he was found to be tachycardic with heart rate in the 130s.  EKG showed he was in atrial flutter with variable A-V rate, underlying right bundle branch block.  He was placed on IV metoprolol which later transition to metoprolol succinate.  Echocardiogram obtained on 01/27/2023 showed EF 40 to 45%, global hypokinesis, severe hypokinesis of the  LV, basal mid inferoseptal wall, inferior wall, and inferior lateral wall, mildly reduced RV systolic function, mild biatrial enlargement, mild dilated aortic root measuring at 38 mm.  Patient was had some chest tightness during the hospitalization, however it was worse with deep inspiration and reproducible with palpation on exam.  He was started on Eliquis.  He was not placed on ARB or ARNI due to CKD.  I saw him on 02/03/2023 for preop clearance.  EKG at the time showed atrial flutter with heart rate of 84 bpm.  He continued to have intermittent chest discomfort like the one he had during the recent hospitalization.  He was a poor historian.  He was cleared to proceed from the cardiac perspective after holding Eliquis for 2 days.  I plan to see the patient back after his procedure to discuss possibility of cardioversion with plan to proceed with PET stress test if he is still having chest pain after the cardioversion.  He underwent transurethral resection of the bladder tumor with urethral dilatation by Dr. Arita Miss on 02/09/2023.  Unfortunately, he was readmitted to the hospital on 02/24/2023 with a week of shortness of breath.  Chest x-ray showed left lower lobe opacity.  Viral panel negative.  A CT of abdomen and pelvis showed no acute finding other than 3 cm infrarenal aortic aneurysm and a 1.3 cm enhancing lesion in his liver recommending MRI for follow-up.  He also had a left-sided pleural effusion.  He was treated for hospital-acquired pneumonia with antibiotic and also mild of volume overload was low-dose IV Lasix.  He  later developed AKI with diuretic.  On the day of discharge, AKI improved.  He ambulating on room air, and desaturated down to 87% requiring 2 L to recover to 90s.  He was discharged on 3 days of prednisone.  His glipizide and Actos were discontinued.  He has been started on Jardiance on top of home insulin.  It was recommended to obtain abdominal MRI as outpatient to rule out liver lesion.    Patient presents today for posthospital follow-up.  The interview was conducted with the help of Spanish translator Eddie Graham.  Patient continued to complain intermittent chest discomfort since recent discharge.  Chest pain is worse with deep inspiration but also happens with ambulation.  I will recommend a PET stress test to rule out potential cardiac cause.  Otherwise, he appears to be euvolemic without significant lower extremity edema, orthopnea or PND.  He does require 2 L oxygen.  We discussed the the recent hospitalization and the confirmed he is aware that he will need a abdominal MRI to rule out liver lesion.  This will need to be obtained by PCP.  I have printed out his CT report and gave it to him.  I plan to see the patient back in 3 to 4 weeks for reassessment.  He if he is still in A-fib/atrial flutter at that time, we will proceed with outpatient cardioversion.  He is aware that he needs to be absolutely compliant with Eliquis prior to the cardioversion.  ROS:   Patient complains of chest pain.  He has shortness of breath improved on oxygen.  He has no lower extremity edema, orthopnea or PND.  Studies Reviewed: .        Cardiac Studies & Procedures       ECHOCARDIOGRAM  ECHOCARDIOGRAM COMPLETE 01/27/2023  Narrative ECHOCARDIOGRAM REPORT    Patient Name:   Eddie Graham Date of Exam: 01/27/2023 Medical Rec #:  [id number]                 Height:       66.0 in Accession #:    [id number]                Weight:       200.0 lb Date of Birth:  Apr 14, 1935                 BSA:          2.000 m Patient Age:    88 years                  BP:           120/68 mmHg Patient Gender: M                         HR:           117 bpm. Exam Location:  Inpatient  Procedure: 2D Echo, Cardiac Doppler and Color Doppler  Indications:    I48.91* Unspeicified atrial fibrillation; R07.9* Chest pain, unspecified  History:        Patient has no prior history of Echocardiogram  examinations. CAD, Abnormal ECG; Risk Factors:Dyslipidemia.  Sonographer:    Sheralyn Boatman RDCS Referring Phys: [id number] MIR M [facility]   Sonographer Comments: Technically difficult study due to poor echo windows. IMPRESSIONS   1. Left ventricular ejection fraction, by estimation, is 40 to 45%. The left ventricle has mildly decreased function. The left ventricle demonstrates global hypokinesis. Left ventricular diastolic  parameters are indeterminate. There is severe hypokinesis of the left ventricular, basal-mid inferoseptal wall, inferior wall and inferolateral wall. 2. Right ventricular systolic function is mildly reduced. The right ventricular size is normal. There is normal pulmonary artery systolic pressure. 3. Left atrial size was mildly dilated. 4. Right atrial size was mildly dilated. 5. The mitral valve is degenerative. Trivial mitral valve regurgitation. No evidence of mitral stenosis. 6. The aortic valve is tricuspid. Aortic valve regurgitation is mild. No aortic stenosis is present. 7. Aortic dilatation noted. 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  FINDINGS Left Ventricle: Left ventricular ejection fraction, by estimation, is 40 to 45%. The left ventricle has mildly decreased function. The left ventricle demonstrates global hypokinesis. Severe hypokinesis of the left ventricular, basal-mid inferoseptal wall, inferior wall and inferolateral wall. The left ventricular internal cavity size was normal in size. There is no left ventricular hypertrophy. Left ventricular diastolic function could not be evaluated due to atrial fibrillation. Left ventricular diastolic parameters are indeterminate.   LV Wall Scoring: The inferior wall, posterior wall, mid inferoseptal segment, and basal inferoseptal segment are hypokinetic.  Right Ventricle: The right ventricular size is normal. No increase in right  ventricular wall thickness. Right ventricular systolic function is mildly reduced. There is normal pulmonary artery systolic pressure. The tricuspid regurgitant velocity is 1.87 m/s, and with an assumed right atrial pressure of 3 mmHg, the estimated right ventricular systolic pressure is 17.0 mmHg.  Left Atrium: Left atrial size was mildly dilated.  Right Atrium: Right atrial size was mildly dilated.  Pericardium: There is no evidence of pericardial effusion. Presence of epicardial fat layer.  Mitral Valve: The mitral valve is degenerative in appearance. Mild mitral annular calcification. Trivial mitral valve regurgitation. No evidence of mitral valve stenosis.  Tricuspid Valve: The tricuspid valve is normal in structure. Tricuspid valve regurgitation is trivial. No evidence of tricuspid stenosis.  Aortic Valve: The aortic valve is tricuspid. Aortic valve regurgitation is mild. No aortic stenosis is present.  Pulmonic Valve: The pulmonic valve was normal in structure. Pulmonic valve regurgitation is not visualized. No evidence of pulmonic stenosis.  Aorta: Aortic dilatation noted. 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  IAS/Shunts: No atrial level shunt detected by color flow Doppler.   LEFT VENTRICLE PLAX 2D LVIDd:         4.60 cm LVIDs:         4.00 cm LV PW:         1.10 cm LV IVS:        1.00 cm LVOT diam:     2.40 cm LV SV:         51 LV SV Index:   25 LVOT Area:     4.52 cm  LV Volumes (MOD) LV vol d, MOD A2C: 38.8 ml LV vol d, MOD A4C: 60.1 ml LV vol s, MOD A2C: 19.4 ml LV vol s, MOD A4C: 31.3 ml LV SV MOD A2C:     19.4 ml LV SV MOD A4C:     60.1 ml LV SV MOD BP:      24.6 ml  RIGHT VENTRICLE            IVC RV S prime:     8.16 cm/s  IVC diam: 2.00 cm TAPSE (M-mode): 1.7 cm  LEFT ATRIUM  Index        RIGHT ATRIUM           Index LA diam:        4.00 cm 2.00 cm/m   RA Area:      15.40 cm LA Vol (A2C):   33.8 ml 16.90 ml/m  RA Volume:   35.00 ml  17.50 ml/m LA Vol (A4C):   33.7 ml 16.85 ml/m LA Biplane Vol: 35.3 ml 17.65 ml/m AORTIC VALVE LVOT Vmax:   91.70 cm/s LVOT Vmean:  57.000 cm/s LVOT VTI:    0.112 m  AORTA Ao Root diam: 3.80 cm Ao Asc diam:  4.00 cm  MITRAL VALVE               TRICUSPID VALVE MV Area (PHT): 4.68 cm    TR Peak grad:   14.0 mmHg MV Decel Time: 162 msec    TR Vmax:        187.00 cm/s MV E velocity: 59.40 cm/s MV A velocity: 84.50 cm/s  SHUNTS MV E/A ratio:  0.70        Systemic VTI:  0.11 m Systemic Diam: 2.40 cm  Mihai Croitoru MD Electronically signed by Thurmon Fair MD Signature Date/Time: 01/27/2023/11:43:43 AM    Final             Risk Assessment/Calculations:    CHA2DS2-VASc Score = 6   This indicates a 9.7% annual risk of stroke. The patient's score is based upon: CHF History: 1 HTN History: 1 Diabetes History: 1 Stroke History: 0 Vascular Disease History: 1 Age Score: 2 Gender Score: 0            Physical Exam:   VS:  BP 110/74 (BP Location: Left Arm, Patient Position: Sitting, Cuff Size: Large)   Pulse 79   Ht 5\' 6"  (1.676 m)   Wt 213 lb (96.6 kg)   SpO2 92%   BMI 34.38 kg/m    Wt Readings from Last 3 Encounters:  03/02/23 213 lb (96.6 kg)  03/01/23 212 lb 1.6 oz (96.2 kg)  02/09/23 216 lb 3.2 oz (98.1 kg)    GEN: Well nourished, well developed in no acute distress NECK: No JVD; No carotid bruits CARDIAC: RRR, no murmurs, rubs, gallops RESPIRATORY:  Clear to auscultation without rales, wheezing or rhonchi  ABDOMEN: Soft, non-tender, non-distended EXTREMITIES:  No edema; No deformity   ASSESSMENT AND PLAN: .    Chest pain: Continue to have intermittent chest pain on a daily basis.  Previously, cardiology service was consulted during hospitalization in early October 2024 and felt his chest pain to be atypical.  He says his chest pain is worse with palpation and physical exertion.  I  will obtain a PET stress test.  Persistent atrial flutter: Initially diagnosed in early October 2024 during hospitalization.  He has been compliant with Eliquis, however have to hold Eliquis for his urology procedure in mid October.  I plan to bring the patient back in 3 to 4 weeks for reassessment, can potentially arrange outpatient DCCV at the time.  CAD s/p CABG 2011: See #1, previous workup in [place].  Plan to obtain PET stress test  Bladder cancer: Followed by the urology service.  Recently underwent a cystoscopy with urethral dilatation by Dr. Arita Miss in October 2024  Hypertension: Blood pressure well-controlled  Hyperlipidemia: On rosuvastatin  CKD stage III: Had AKI during the recent hospitalization after IV diuresis, renal function improved to baseline prior to discharge.  Liver lesion: CT  report obtained recently given to the patient, he will need to discuss with his PCP to order abdominal MRI with contrast    Informed Consent   Shared Decision Making/Informed Consent The risks [chest pain, shortness of breath, cardiac arrhythmias, dizziness, blood pressure fluctuations, myocardial infarction, stroke/transient ischemic attack, nausea, vomiting, allergic reaction, radiation exposure, metallic taste sensation and life-threatening complications (estimated to be 1 in 10,000)], benefits (risk stratification, diagnosing coronary artery disease, treatment guidance) and alternatives of a cardiac PET stress test were discussed in detail with Eddie Graham and he agrees to proceed.     Dispo: Follow-up in 3 to 4 weeks  Signed, Azalee Course, [place]

## 2023-03-24 ENCOUNTER — Ambulatory Visit: Payer: 59 | Attending: Student | Admitting: Physical Therapy

## 2023-04-10 ENCOUNTER — Inpatient Hospital Stay (HOSPITAL_COMMUNITY)
Admission: EM | Admit: 2023-04-10 | Discharge: 2023-04-17 | DRG: 291 | Disposition: A | Discharge status: Home Health | Payer: 59 | Attending: Internal Medicine | Admitting: Internal Medicine

## 2023-04-10 ENCOUNTER — Emergency Department (HOSPITAL_COMMUNITY): Payer: 59

## 2023-04-10 ENCOUNTER — Other Ambulatory Visit: Payer: Self-pay

## 2023-04-10 ENCOUNTER — Encounter (HOSPITAL_COMMUNITY): Payer: Self-pay

## 2023-04-10 DIAGNOSIS — F32A Depression, unspecified: Secondary | ICD-10-CM | POA: Diagnosis present

## 2023-04-10 DIAGNOSIS — I5023 Acute on chronic systolic (congestive) heart failure: Secondary | ICD-10-CM | POA: Diagnosis present

## 2023-04-10 DIAGNOSIS — N183 Chronic kidney disease, stage 3 unspecified: Secondary | ICD-10-CM | POA: Diagnosis not present

## 2023-04-10 DIAGNOSIS — K0889 Other specified disorders of teeth and supporting structures: Secondary | ICD-10-CM | POA: Diagnosis present

## 2023-04-10 DIAGNOSIS — Z6834 Body mass index (BMI) 34.0-34.9, adult: Secondary | ICD-10-CM

## 2023-04-10 DIAGNOSIS — I7143 Infrarenal abdominal aortic aneurysm, without rupture: Secondary | ICD-10-CM | POA: Diagnosis present

## 2023-04-10 DIAGNOSIS — J9811 Atelectasis: Secondary | ICD-10-CM | POA: Diagnosis present

## 2023-04-10 DIAGNOSIS — E785 Hyperlipidemia, unspecified: Secondary | ICD-10-CM | POA: Diagnosis present

## 2023-04-10 DIAGNOSIS — I509 Heart failure, unspecified: Secondary | ICD-10-CM | POA: Diagnosis not present

## 2023-04-10 DIAGNOSIS — I25119 Atherosclerotic heart disease of native coronary artery with unspecified angina pectoris: Secondary | ICD-10-CM | POA: Diagnosis present

## 2023-04-10 DIAGNOSIS — I2510 Atherosclerotic heart disease of native coronary artery without angina pectoris: Secondary | ICD-10-CM | POA: Diagnosis present

## 2023-04-10 DIAGNOSIS — I483 Typical atrial flutter: Secondary | ICD-10-CM | POA: Diagnosis not present

## 2023-04-10 DIAGNOSIS — I129 Hypertensive chronic kidney disease with stage 1 through stage 4 chronic kidney disease, or unspecified chronic kidney disease: Secondary | ICD-10-CM | POA: Diagnosis not present

## 2023-04-10 DIAGNOSIS — N1832 Chronic kidney disease, stage 3b: Secondary | ICD-10-CM | POA: Diagnosis present

## 2023-04-10 DIAGNOSIS — J4489 Other specified chronic obstructive pulmonary disease: Secondary | ICD-10-CM | POA: Diagnosis present

## 2023-04-10 DIAGNOSIS — Z794 Long term (current) use of insulin: Secondary | ICD-10-CM

## 2023-04-10 DIAGNOSIS — G8929 Other chronic pain: Secondary | ICD-10-CM | POA: Diagnosis present

## 2023-04-10 DIAGNOSIS — K219 Gastro-esophageal reflux disease without esophagitis: Secondary | ICD-10-CM | POA: Diagnosis present

## 2023-04-10 DIAGNOSIS — J9621 Acute and chronic respiratory failure with hypoxia: Secondary | ICD-10-CM | POA: Diagnosis present

## 2023-04-10 DIAGNOSIS — I4892 Unspecified atrial flutter: Secondary | ICD-10-CM | POA: Diagnosis present

## 2023-04-10 DIAGNOSIS — Z933 Colostomy status: Secondary | ICD-10-CM

## 2023-04-10 DIAGNOSIS — Z8719 Personal history of other diseases of the digestive system: Secondary | ICD-10-CM

## 2023-04-10 DIAGNOSIS — I5021 Acute systolic (congestive) heart failure: Secondary | ICD-10-CM | POA: Diagnosis not present

## 2023-04-10 DIAGNOSIS — I083 Combined rheumatic disorders of mitral, aortic and tricuspid valves: Secondary | ICD-10-CM | POA: Diagnosis present

## 2023-04-10 DIAGNOSIS — E1122 Type 2 diabetes mellitus with diabetic chronic kidney disease: Secondary | ICD-10-CM | POA: Diagnosis present

## 2023-04-10 DIAGNOSIS — I340 Nonrheumatic mitral (valve) insufficiency: Secondary | ICD-10-CM | POA: Diagnosis not present

## 2023-04-10 DIAGNOSIS — I480 Paroxysmal atrial fibrillation: Secondary | ICD-10-CM | POA: Diagnosis present

## 2023-04-10 DIAGNOSIS — I452 Bifascicular block: Secondary | ICD-10-CM | POA: Diagnosis present

## 2023-04-10 DIAGNOSIS — I130 Hypertensive heart and chronic kidney disease with heart failure and stage 1 through stage 4 chronic kidney disease, or unspecified chronic kidney disease: Secondary | ICD-10-CM | POA: Diagnosis present

## 2023-04-10 DIAGNOSIS — Z7901 Long term (current) use of anticoagulants: Secondary | ICD-10-CM

## 2023-04-10 DIAGNOSIS — I4891 Unspecified atrial fibrillation: Secondary | ICD-10-CM | POA: Diagnosis not present

## 2023-04-10 DIAGNOSIS — R5381 Other malaise: Secondary | ICD-10-CM | POA: Diagnosis present

## 2023-04-10 DIAGNOSIS — D631 Anemia in chronic kidney disease: Secondary | ICD-10-CM | POA: Diagnosis present

## 2023-04-10 DIAGNOSIS — R300 Dysuria: Secondary | ICD-10-CM | POA: Diagnosis not present

## 2023-04-10 DIAGNOSIS — Z8551 Personal history of malignant neoplasm of bladder: Secondary | ICD-10-CM

## 2023-04-10 DIAGNOSIS — Z9889 Other specified postprocedural states: Secondary | ICD-10-CM

## 2023-04-10 DIAGNOSIS — N400 Enlarged prostate without lower urinary tract symptoms: Secondary | ICD-10-CM | POA: Diagnosis present

## 2023-04-10 DIAGNOSIS — R0602 Shortness of breath: Secondary | ICD-10-CM

## 2023-04-10 DIAGNOSIS — Z5941 Food insecurity: Secondary | ICD-10-CM

## 2023-04-10 DIAGNOSIS — I428 Other cardiomyopathies: Secondary | ICD-10-CM | POA: Diagnosis present

## 2023-04-10 DIAGNOSIS — R0789 Other chest pain: Secondary | ICD-10-CM | POA: Diagnosis present

## 2023-04-10 DIAGNOSIS — Z951 Presence of aortocoronary bypass graft: Secondary | ICD-10-CM | POA: Diagnosis not present

## 2023-04-10 DIAGNOSIS — F419 Anxiety disorder, unspecified: Secondary | ICD-10-CM | POA: Diagnosis present

## 2023-04-10 DIAGNOSIS — R079 Chest pain, unspecified: Principal | ICD-10-CM | POA: Diagnosis present

## 2023-04-10 DIAGNOSIS — K5900 Constipation, unspecified: Secondary | ICD-10-CM | POA: Diagnosis present

## 2023-04-10 DIAGNOSIS — E1165 Type 2 diabetes mellitus with hyperglycemia: Secondary | ICD-10-CM | POA: Diagnosis present

## 2023-04-10 DIAGNOSIS — G4700 Insomnia, unspecified: Secondary | ICD-10-CM | POA: Diagnosis present

## 2023-04-10 DIAGNOSIS — E669 Obesity, unspecified: Secondary | ICD-10-CM | POA: Diagnosis present

## 2023-04-10 DIAGNOSIS — R109 Unspecified abdominal pain: Secondary | ICD-10-CM | POA: Diagnosis present

## 2023-04-10 DIAGNOSIS — Z79899 Other long term (current) drug therapy: Secondary | ICD-10-CM

## 2023-04-10 DIAGNOSIS — Z8679 Personal history of other diseases of the circulatory system: Secondary | ICD-10-CM

## 2023-04-10 DIAGNOSIS — Z602 Problems related to living alone: Secondary | ICD-10-CM | POA: Diagnosis present

## 2023-04-10 DIAGNOSIS — Z7951 Long term (current) use of inhaled steroids: Secondary | ICD-10-CM

## 2023-04-10 DIAGNOSIS — Z7984 Long term (current) use of oral hypoglycemic drugs: Secondary | ICD-10-CM

## 2023-04-10 DIAGNOSIS — I714 Abdominal aortic aneurysm, without rupture, unspecified: Secondary | ICD-10-CM

## 2023-04-10 DIAGNOSIS — Z9079 Acquired absence of other genital organ(s): Secondary | ICD-10-CM

## 2023-04-10 DIAGNOSIS — Z9981 Dependence on supplemental oxygen: Secondary | ICD-10-CM

## 2023-04-10 LAB — CBC
HCT: 47.2 % (ref 39.0–52.0)
Hemoglobin: 14.7 g/dL (ref 13.0–17.0)
MCH: 28.1 pg (ref 26.0–34.0)
MCHC: 31.1 g/dL (ref 30.0–36.0)
MCV: 90.1 fL (ref 80.0–100.0)
Platelets: 241 10*3/uL (ref 150–400)
RBC: 5.24 MIL/uL (ref 4.22–5.81)
RDW: 15.3 % (ref 11.5–15.5)
WBC: 8.8 10*3/uL (ref 4.0–10.5)
nRBC: 0 % (ref 0.0–0.2)

## 2023-04-10 LAB — COMPREHENSIVE METABOLIC PANEL
ALT: 18 U/L (ref 0–44)
AST: 19 U/L (ref 15–41)
Albumin: 3.1 g/dL — ABNORMAL LOW (ref 3.5–5.0)
Alkaline Phosphatase: 80 U/L (ref 38–126)
Anion gap: 10 (ref 5–15)
BUN: 19 mg/dL (ref 8–23)
CO2: 23 mmol/L (ref 22–32)
Calcium: 8.7 mg/dL — ABNORMAL LOW (ref 8.9–10.3)
Chloride: 106 mmol/L (ref 98–111)
Creatinine, Ser: 1.74 mg/dL — ABNORMAL HIGH (ref 0.61–1.24)
GFR, Estimated: 38 mL/min — ABNORMAL LOW (ref >60)
Glucose, Bld: 143 mg/dL — ABNORMAL HIGH (ref 70–99)
Potassium: 3.9 mmol/L (ref 3.5–5.1)
Sodium: 139 mmol/L (ref 135–145)
Total Bilirubin: 1.8 mg/dL — ABNORMAL HIGH (ref <1.2)
Total Protein: 6.5 g/dL (ref 6.5–8.1)

## 2023-04-10 LAB — GLUCOSE, CAPILLARY: Glucose-Capillary: 92 mg/dL (ref 70–99)

## 2023-04-10 LAB — RESP PANEL BY RT-PCR (RSV, FLU A&B, COVID)  RVPGX2
Influenza A by PCR: NEGATIVE
Influenza B by PCR: NEGATIVE
Resp Syncytial Virus by PCR: NEGATIVE
SARS Coronavirus 2 by RT PCR: NEGATIVE

## 2023-04-10 LAB — TROPONIN I (HIGH SENSITIVITY)
Troponin I (High Sensitivity): 25 ng/L — ABNORMAL HIGH (ref <18)
Troponin I (High Sensitivity): 25 ng/L — ABNORMAL HIGH (ref <18)

## 2023-04-10 LAB — I-STAT CG4 LACTIC ACID, ED: Lactic Acid, Venous: 1.7 mmol/L (ref 0.5–1.9)

## 2023-04-10 LAB — BRAIN NATRIURETIC PEPTIDE: B Natriuretic Peptide: 602.3 pg/mL — ABNORMAL HIGH (ref 0.0–100.0)

## 2023-04-10 MED ORDER — METOPROLOL SUCCINATE ER 25 MG PO TB24
25.0000 mg | ORAL_TABLET | Freq: Every day | ORAL | Status: DC
  Administered 2023-04-10 – 2023-04-11 (×2): 25 mg via ORAL
  Filled 2023-04-10 (×2): qty 1

## 2023-04-10 MED ORDER — ROSUVASTATIN CALCIUM 20 MG PO TABS
40.0000 mg | ORAL_TABLET | Freq: Every day | ORAL | Status: DC
  Administered 2023-04-10 – 2023-04-16 (×7): 40 mg via ORAL
  Filled 2023-04-10 (×8): qty 2

## 2023-04-10 MED ORDER — FUROSEMIDE 10 MG/ML IJ SOLN
40.0000 mg | Freq: Once | INTRAMUSCULAR | Status: AC
  Administered 2023-04-10: 40 mg via INTRAVENOUS
  Filled 2023-04-10: qty 4

## 2023-04-10 MED ORDER — ACETAMINOPHEN 325 MG PO TABS
650.0000 mg | ORAL_TABLET | Freq: Four times a day (QID) | ORAL | Status: DC | PRN
  Administered 2023-04-11 – 2023-04-14 (×3): 650 mg via ORAL
  Filled 2023-04-10 (×3): qty 2

## 2023-04-10 MED ORDER — PANTOPRAZOLE SODIUM 40 MG PO TBEC
40.0000 mg | DELAYED_RELEASE_TABLET | Freq: Two times a day (BID) | ORAL | Status: DC
  Administered 2023-04-10 – 2023-04-17 (×14): 40 mg via ORAL
  Filled 2023-04-10 (×14): qty 1

## 2023-04-10 MED ORDER — MELATONIN 5 MG PO TABS
5.0000 mg | ORAL_TABLET | Freq: Every evening | ORAL | Status: DC | PRN
  Administered 2023-04-10 – 2023-04-11 (×2): 5 mg via ORAL
  Filled 2023-04-10 (×2): qty 1

## 2023-04-10 MED ORDER — OXYCODONE HCL 5 MG PO TABS
2.5000 mg | ORAL_TABLET | ORAL | Status: DC | PRN
  Administered 2023-04-10: 2.5 mg via ORAL
  Filled 2023-04-10: qty 1

## 2023-04-10 MED ORDER — TAMSULOSIN HCL 0.4 MG PO CAPS
0.4000 mg | ORAL_CAPSULE | Freq: Every day | ORAL | Status: DC
  Administered 2023-04-10 – 2023-04-17 (×8): 0.4 mg via ORAL
  Filled 2023-04-10 (×9): qty 1

## 2023-04-10 MED ORDER — APIXABAN 2.5 MG PO TABS
2.5000 mg | ORAL_TABLET | Freq: Two times a day (BID) | ORAL | Status: DC
  Administered 2023-04-10 – 2023-04-17 (×13): 2.5 mg via ORAL
  Filled 2023-04-10 (×14): qty 1

## 2023-04-10 MED ORDER — MORPHINE SULFATE (PF) 4 MG/ML IV SOLN
4.0000 mg | Freq: Once | INTRAVENOUS | Status: AC
  Administered 2023-04-10: 4 mg via INTRAVENOUS
  Filled 2023-04-10: qty 1

## 2023-04-10 MED ORDER — PROCHLORPERAZINE EDISYLATE 10 MG/2ML IJ SOLN: 5.0000 mg | Freq: Four times a day (QID) | INTRAMUSCULAR | Status: DC | PRN

## 2023-04-10 MED ORDER — DULOXETINE HCL 60 MG PO CPEP
60.0000 mg | ORAL_CAPSULE | Freq: Every day | ORAL | Status: DC
Start: 2023-04-11 — End: 2023-04-17
  Administered 2023-04-11 – 2023-04-17 (×7): 60 mg via ORAL
  Filled 2023-04-10: qty 1
  Filled 2023-04-10 (×3): qty 2
  Filled 2023-04-10: qty 1
  Filled 2023-04-10: qty 2
  Filled 2023-04-10: qty 1

## 2023-04-10 MED ORDER — ALUM & MAG HYDROXIDE-SIMETH 200-200-20 MG/5ML PO SUSP
30.0000 mL | Freq: Four times a day (QID) | ORAL | Status: DC | PRN
  Administered 2023-04-10 – 2023-04-12 (×4): 30 mL via ORAL
  Filled 2023-04-10 (×5): qty 30

## 2023-04-10 MED ORDER — UMECLIDINIUM-VILANTEROL 62.5-25 MCG/ACT IN AEPB
1.0000 | INHALATION_SPRAY | Freq: Every day | RESPIRATORY_TRACT | Status: DC
  Administered 2023-04-11 – 2023-04-17 (×6): 1 via RESPIRATORY_TRACT
  Filled 2023-04-10: qty 14

## 2023-04-10 MED ORDER — INSULIN ASPART 100 UNIT/ML IJ SOLN
0.0000 [IU] | Freq: Three times a day (TID) | INTRAMUSCULAR | Status: DC
  Administered 2023-04-11: 1 [IU] via SUBCUTANEOUS
  Administered 2023-04-11 – 2023-04-12 (×3): 2 [IU] via SUBCUTANEOUS
  Administered 2023-04-12: 3 [IU] via SUBCUTANEOUS
  Administered 2023-04-13: 2 [IU] via SUBCUTANEOUS
  Administered 2023-04-13: 3 [IU] via SUBCUTANEOUS
  Administered 2023-04-14: 2 [IU] via SUBCUTANEOUS
  Administered 2023-04-14 (×2): 1 [IU] via SUBCUTANEOUS
  Administered 2023-04-15: 2 [IU] via SUBCUTANEOUS
  Administered 2023-04-16: 3 [IU] via SUBCUTANEOUS
  Administered 2023-04-16: 2 [IU] via SUBCUTANEOUS
  Administered 2023-04-17: 1 [IU] via SUBCUTANEOUS

## 2023-04-10 MED ORDER — INSULIN ASPART 100 UNIT/ML IJ SOLN
0.0000 [IU] | Freq: Every day | INTRAMUSCULAR | Status: DC
  Administered 2023-04-16: 2 [IU] via SUBCUTANEOUS

## 2023-04-10 MED ORDER — MONTELUKAST SODIUM 10 MG PO TABS
10.0000 mg | ORAL_TABLET | Freq: Every day | ORAL | Status: DC
  Administered 2023-04-10 – 2023-04-16 (×7): 10 mg via ORAL
  Filled 2023-04-10 (×7): qty 1

## 2023-04-10 MED ORDER — POLYETHYLENE GLYCOL 3350 17 G PO PACK: 17.0000 g | PACK | Freq: Every day | ORAL | Status: DC | PRN

## 2023-04-10 MED ORDER — FENTANYL CITRATE PF 50 MCG/ML IJ SOSY
12.5000 ug | PREFILLED_SYRINGE | INTRAMUSCULAR | Status: AC | PRN
  Administered 2023-04-10 – 2023-04-11 (×4): 50 ug via INTRAVENOUS
  Filled 2023-04-10 (×4): qty 1

## 2023-04-10 MED ORDER — IOHEXOL 350 MG/ML SOLN
75.0000 mL | Freq: Once | INTRAVENOUS | Status: AC | PRN
  Administered 2023-04-10: 75 mL via INTRAVENOUS

## 2023-04-10 MED ORDER — FENTANYL CITRATE PF 50 MCG/ML IJ SOSY
50.0000 ug | PREFILLED_SYRINGE | Freq: Once | INTRAMUSCULAR | Status: AC
Start: 2023-04-10 — End: 2023-04-10
  Administered 2023-04-10: 50 ug via INTRAVENOUS
  Filled 2023-04-10: qty 1

## 2023-04-10 NOTE — Consult Note (Addendum)
Cardiology Consultation   Patient ID: Eddie Graham MRN: [id number]; DOB: 11/13/1934  Admit date: 04/10/2023 Date of Consult: 04/10/2023  PCP:  Andreas Blower., MD   [facility] Providers Cardiologist:  Rollene Rotunda, MD        Patient Profile:   Eddie Graham is a 88 y.o. male with a hx of chronic heart failure with reduced ejection fraction (EF 40-45% on 01/27/2023), coronary artery disease s/p CABG in 2011, paroxysmal atrial fibrillation/atrial flutter, AAA, sigmoid colon abscess s/p end colostomy, and chronic kidney disease IIIb who is being seen 04/10/2023 for the evaluation of chest pain at the request of hospital medicine.  History of Present Illness:    Spanish translation via video used for HPI Eddie Graham [phone])  Eddie Graham states that he has been having ongoing abdominal and chest pain for several months. He states that symptoms worsened over the last day, prompting ED evaluation.   He states over the last two days he has been having loose stools. No nausea and no vomiting. No fevers or chills. He says that his abdominal pain is diffuse and involves his entire chest area. The pain is constant and occurs with rest. On exam, his chest pain is reproducible.   Patient is having trouble recalling where he lives and states the year is 2025. Otherwise, he is following commands.   In ED patient had CT angio that showed post op changes of ascending aortic aneurysm repair, unchanged saccular aneurysm of infrarenal abdominal aorta, small bilateral pleural effusions, and questionable mild colitis. Troponin 25-->25. BNP 602.3. Lactate negative.   EKG with ST, RBBB, LAFB, no ischemic changes.    Past Medical History:  Diagnosis Date   AAA (abdominal aortic aneurysm) (HCC)    Anemia    Anxiety    Arthritis    Asthma    Back pain    Cancer (HCC)    Chronic kidney disease    COPD (chronic obstructive pulmonary disease) (HCC)     Depression    Diabetes mellitus without complication (HCC)    Dyspnea    Hypertension    Insomnia     Past Surgical History:  Procedure Laterality Date   BOWEL RESECTION     CARDIAC SURGERY     CYSTOSCOPY WITH URETHRAL DILATATION N/A 09/30/2021   Procedure: CYSTOSCOPY WITH  URETHRAL BALLOON  DILATATION;  Surgeon: Noel Christmas, MD;  Location: [facility];  Service: Urology;  Laterality: N/A;   CYSTOSCOPY WITH URETHRAL DILATATION N/A 12/09/2021   Procedure: CYSTOSCOPY WITH URETHRAL DILATATION;  Surgeon: Noel Christmas, MD;  Location: [facility];  Service: Urology;  Laterality: N/A;  1 HR   CYSTOSCOPY WITH URETHRAL DILATATION N/A 02/09/2023   Procedure: CYSTOSCOPY WITH URETHRAL DILATATION;  Surgeon: Noel Christmas, MD;  Location: [facility];  Service: Urology;  Laterality: N/A;   PROSTATE SURGERY     TRANSURETHRAL RESECTION OF BLADDER TUMOR N/A 09/30/2021   Procedure: TRANSURETHRAL RESECTION OF BLADDER TUMOR (TURBT);  Surgeon: Noel Christmas, MD;  Location: [facility];  Service: Urology;  Laterality: N/A;  90 MINS   TRANSURETHRAL RESECTION OF BLADDER TUMOR N/A 02/09/2023   Procedure: TRANSURETHRAL RESECTION OF BLADDER TUMOR (TURBT);  Surgeon: Noel Christmas, MD;  Location: [facility];  Service: Urology;  Laterality: N/A;     Home Medications:  Prior to Admission medications   Medication Sig Start Date End Date Taking? Authorizing Provider  acetaminophen (TYLENOL) 650 MG CR tablet Take 650 mg by mouth every 8 (eight) hours  as needed for pain. 03/26/22   [provider]  albuterol (VENTOLIN HFA) 108 (90 Base) MCG/ACT inhaler Inhale 2 puffs into the lungs every 4 (four) hours as needed for wheezing. 11/11/20   [provider]  Ernestina Patches 62.5-25 MCG/ACT AEPB Inhale 1 puff into the lungs daily.    [provider]  apixaban (ELIQUIS) 2.5 MG TABS tablet Take 1 tablet (2.5 mg total) by mouth 2 (two) times daily. 01/29/23   Marguerita Merles Latif, DO  Carboxymethylcellulose  Sodium (EYE DROPS OP) Place 2 drops into both eyes as needed (dryness/itching).    [provider]  DULoxetine (CYMBALTA) 60 MG capsule Take 30-60 mg by mouth daily. 02/07/21   [provider]  empagliflozin (JARDIANCE) 10 MG TABS tablet Take 1 tablet (10 mg total) by mouth daily before breakfast. 03/01/23   Almon Hercules, MD  ferrous sulfate 325 (65 FE) MG tablet Take 325 mg by mouth daily with breakfast. 08/21/22   [provider]  furosemide (LASIX) 20 MG tablet Take 1 tablet (20 mg total) by mouth daily. 03/08/23   Almon Hercules, MD  gabapentin (NEURONTIN) 100 MG capsule Take 100-300 mg by mouth at bedtime as needed (leg pain). 02/25/21   [provider]  Insulin Pen Needle (TECHLITE PEN NEEDLES) 32G X 4 MM MISC Use to inject Lantus at bedtime as directed 06/15/22   Rhetta Mura, MD  LANTUS SOLOSTAR 100 UNIT/ML Solostar Pen Inject 15 Units into the skin at bedtime. Patient taking differently: Inject 24 Units into the skin at bedtime. 06/15/22   Rhetta Mura, MD  Melatonin 5 MG CAPS Take 5 mg by mouth at bedtime as needed (sleep). 08/16/19   [provider]  metoprolol succinate (TOPROL-XL) 100 MG 24 hr tablet Take 1 tablet (100 mg total) by mouth daily. Take with or immediately following a meal. 01/30/23   Sheikh, Omair Latif, DO  montelukast (SINGULAIR) 10 MG tablet Take 10 mg by mouth at bedtime. 03/19/21   [provider]  pantoprazole (PROTONIX) 40 MG tablet Take 1 tablet by mouth 2 times daily. 06/15/22   Rhetta Mura, MD  polyethylene glycol (MIRALAX / GLYCOLAX) 17 g packet Take 17 g by mouth daily as needed for mild constipation. 03/24/22   [provider]  rosuvastatin (CRESTOR) 40 MG tablet Take 40 mg by mouth at bedtime. 04/07/21   [provider]  Semaglutide 14 MG TABS Take 14 mg by mouth daily. 03/14/21   [provider]  tamsulosin (FLOMAX) 0.4 MG CAPS capsule Take 0.4 mg by mouth daily.  11/23/21   [provider]  traMADol (ULTRAM) 50 MG tablet Take 1 tablet (50 mg total) by mouth every 6 (six) hours as needed. 03/01/23 02/29/24  Almon Hercules, MD  traZODone (DESYREL) 50 MG tablet Take 50 mg by mouth at bedtime. 03/14/21   [provider]    Inpatient Medications: Scheduled Meds:  apixaban  2.5 mg Oral BID   DULoxetine  30-60 mg Oral Daily   insulin aspart  0-5 Units Subcutaneous QHS   [START ON 04/11/2023] insulin aspart  0-9 Units Subcutaneous TID WC   metoprolol succinate  25 mg Oral Daily   montelukast  10 mg Oral QHS   pantoprazole  40 mg Oral BID   rosuvastatin  40 mg Oral Daily   tamsulosin  0.4 mg Oral Daily   umeclidinium-vilanterol  1 puff Inhalation Daily   Continuous Infusions:  PRN Meds: acetaminophen, fentaNYL (SUBLIMAZE) injection, melatonin, oxyCODONE,  polyethylene glycol, prochlorperazine  Allergies:   No Known Allergies  Social History:   Social History   Socioeconomic History   Marital status: Married    Spouse name: Not on file   Number of children: Not on file   Years of education: Not on file   Highest education level: Not on file  Occupational History   Not on file  Tobacco Use   Smoking status: Never   Smokeless tobacco: Never  Vaping Use   Vaping status: Never Used  Substance and Sexual Activity   Alcohol use: Not Currently   Drug use: Not Currently   Sexual activity: Not on file  Other Topics Concern   Not on file  Social History Narrative   Not on file   Social Drivers of Health   Financial Resource Strain: Low Risk  (08/29/2021)   Overall Financial Resource Strain (CARDIA)    Difficulty of Paying Living Expenses: Not very hard  Food Insecurity: Unknown (03/02/2023)   Received from [facility]   Hunger Vital Sign    Worried About Running Out of Food in the Last Year: Patient unable to answer    Ran Out of Food in the Last Year: Patient unable to answer  Transportation Needs: Unmet Transportation  Needs (03/02/2023)   Received from [employer]    In the past 12 months, has lack of reliable transportation kept you from medical appointments, meetings, work or from getting things needed for daily living? : Yes  Physical Activity: Not on file  Stress: Not on file  Social Connections: Not on file  Intimate Partner Violence: Not At Risk (02/25/2023)   Humiliation, Afraid, Rape, and Kick questionnaire    Fear of Current or Ex-Partner: No    Emotionally Abused: No    Physically Abused: No    Sexually Abused: No    Family History:   History reviewed. No pertinent family history.   ROS:  Please see the history of present illness.  All other ROS reviewed and negative.     Physical Exam/Data:   Vitals:   04/10/23 1800 04/10/23 1815 04/10/23 2011 04/10/23 2057  BP: (!) 153/98 (!) 158/102 (!) 174/101 (!) 156/97  Pulse: (!) 130 97 (!) 126 (!) 130  Resp: (!) 32 13 15 16   Temp:   97.6 F (36.4 C) 97.6 F (36.4 C)  TempSrc:   Oral Oral  SpO2: 95% 99% 96% 96%  Weight:       No intake or output data in the 24 hours ending 04/10/23 2151    04/10/2023    1:20 PM 03/02/2023    9:53 AM 03/01/2023    4:34 AM  Last 3 Weights  Weight (lbs) 216 lb 213 lb 212 lb 1.6 oz  Weight (kg) 97.977 kg 96.616 kg 96.208 kg     Body mass index is 34.86 kg/m.  General:  Well nourished, well developed, in no acute distress. Oriented X 1.  HEENT: normal Neck: +JVD Vascular: No carotid bruits; Distal pulses 2+ bilaterally Cardiac:  normal S1, S2; tachycardic, regular rhythm . CP is reproducible on exam  Lungs:  Decreased breath sounds at bases, symmetric chest rise Abd: Non distended, ostomy in place, diffusely tender, no guarding, no rebound  Ext: no edema Musculoskeletal:  No deformities, BUE and BLE strength normal and equal Skin: warm and dry  Neuro:  CNs 2-12 intact, no focal abnormalities noted Psych:  Normal affect   EKG:  The EKG was personally reviewed and  demonstrates:   ST, RBBB, LAFB    Laboratory Data:  High Sensitivity Troponin:   Recent Labs  Lab 04/10/23 1326 04/10/23 1527  TROPONINIHS 25* 25*     Chemistry Recent Labs  Lab 04/10/23 1326  NA 139  K 3.9  CL 106  CO2 23  GLUCOSE 143*  BUN 19  CREATININE 1.74*  CALCIUM 8.7*  GFRNONAA 38*  ANIONGAP 10    Recent Labs  Lab 04/10/23 1326  PROT 6.5  ALBUMIN 3.1*  AST 19  ALT 18  ALKPHOS 80  BILITOT 1.8*   Lipids No results for input(s): "CHOL", "TRIG", "HDL", "LABVLDL", "LDLCALC", "CHOLHDL" in the last 168 hours.  Hematology Recent Labs  Lab 04/10/23 1326  WBC 8.8  RBC 5.24  HGB 14.7  HCT 47.2  MCV 90.1  MCH 28.1  MCHC 31.1  RDW 15.3  PLT 241   Thyroid No results for input(s): "TSH", "FREET4" in the last 168 hours.  BNP Recent Labs  Lab 04/10/23 1326  BNP 602.3*    DDimer No results for input(s): "DDIMER" in the last 168 hours.   Radiology/Studies:  CT Angio Chest/Abd/Pel for Dissection W and/or Wo Contrast Result Date: 04/10/2023 CLINICAL DATA:  Chest pain and shortness of breath. Acute aortic syndrome suspected EXAM: CT ANGIOGRAPHY CHEST, ABDOMEN AND PELVIS TECHNIQUE: Non-contrast CT of the chest was initially obtained. Multidetector CT imaging through the chest, abdomen and pelvis was performed using the standard protocol during bolus administration of intravenous contrast. Multiplanar reconstructed images and MIPs were obtained and reviewed to evaluate the vascular anatomy. RADIATION DOSE REDUCTION: This exam was performed according to the departmental dose-optimization program which includes automated exposure control, adjustment of the mA and/or kV according to patient size and/or use of iterative reconstruction technique. CONTRAST:  75mL OMNIPAQUE IOHEXOL 350 MG/ML SOLN COMPARISON:  Radiographs earlier today and CT abdomen and pelvis 02/25/2023 FINDINGS: CTA CHEST FINDINGS Cardiovascular: Coronary artery and aortic atherosclerotic calcification. No pericardial  effusion. Irregularity about the ascending aortic wall is favored chronic/postoperative. No aortic aneurysm or dissection. Mediastinum/Nodes: Trachea and esophagus are unremarkable. No thoracic adenopathy. Lungs/Pleura: Emphysema. Small bilateral pleural effusions and associated atelectasis. No pneumothorax. Musculoskeletal: No acute fracture. Review of the MIP images confirms the above findings. CTA ABDOMEN AND PELVIS FINDINGS VASCULAR Aorta: No aneurysm or dissection. Broad-based penetrating atherosclerotic ulcer in the distal infrarenal abdominal aorta just proximal to the bifurcation. The base measures 1.5 x 1.0 cm with 1.0 cm in depth. The maximum aortic width at this site measures 3.3 cm. This is not substantially changed from 02/25/2023. Celiac: No hemodynamically significant stenosis. 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 SMA: Moderate narrowing at the origin secondary to calcified atherosclerotic plaque no aneurysm or dissection. Renals: Moderate narrowing at the origins of both renal arteries due to calcified atherosclerotic plaque. No aneurysm or dissection. IMA: Patent. Inflow: Scattered calcified atherosclerotic plaque without aneurysm or dissection. Veins: No obvious venous abnormality within the limitations of this arterial phase study. Review of the MIP images confirms the above findings. NON-VASCULAR Hepatobiliary: No focal liver abnormality is seen. No gallstones, gallbladder wall thickening, or biliary dilatation. Pancreas: Unremarkable. Spleen: Unremarkable. Adrenals/Urinary Tract: Stable adrenal glands. No urinary calculi or hydronephrosis. Unremarkable bladder. Stomach/Bowel: Stomach is within normal limits. Normal caliber large and small bowel. Normal appendix. Postoperative change of partial colectomy with Hartmann's pouch and left lower quadrant colostomy. Question mild wall thickening  about  the colon entering the colostomy. Colonic diverticulosis without diverticulitis. Lymphatic: No lymphadenopathy. Reproductive: Prostate is unremarkable. Other: Small amount of fluid and mild stranding interposed between the proximal Hartmann's pouch and a loop of small bowel (series 7/image 161). No free intraperitoneal air. Musculoskeletal: No acute fracture. Review of the MIP images confirms the above findings. IMPRESSION: 1. Presumed postoperative change of ascending aortic aneurysm repair. Correlate with surgical history. 2. 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 Recommend referral to or continued care with a vascular specialist. 3. Small bilateral pleural effusions and associated atelectasis. 4. Postoperative change of partial colectomy with left lower quadrant colostomy. Question mild colitis of the colon entering the colostomy. 5. Small amount of fluid and mild stranding interposed between the proximal aspect of the patient's colectomy site and a loop of small bowel. This may be due to enteritis or pouchitis. No abscess or free air. Aortic Atherosclerosis (ICD10-I70.0) and Emphysema (ICD10-J43.9). Electronically Signed   By: Minerva Fester M.D.   On: 04/10/2023 17:43   DG Chest Port 1 View Result Date: 04/10/2023 CLINICAL DATA:  Shortness of breath. EXAM: PORTABLE CHEST 1 VIEW COMPARISON:  Chest radiographs 02/25/2023, 01/28/2020, 01/26/2020, 05/24/2022; CT abdomen and pelvis 07/18/2022 FINDINGS: Status post median sternotomy. Cardiac silhouette is again mildly enlarged. Mediastinal contours are grossly within normal limits for AP technique. Chronic bilateral interstitial thickening and coarsening is unchanged from multiple prior radiographs. Left retrocardiac and costophrenic angle opacity is similar to 02/25/2023 and may represent pleural fluid and/or airspace disease. Note  is made of a moderately large epicardial fat pad normally seen in this region on prior CT 07/18/2022. IMPRESSION: 1. Left retrocardiac and costophrenic angle opacity is similar to 02/25/2023. At least a portion of this appears to represent the patient's normal epicardial fat pad. As on 02/25/2023, it is difficult to exclude superimposed mild left basilar atelectasis and/or pneumonia with possible mild pleural effusion. 2. Chronic bilateral interstitial thickening and coarsening is unchanged from multiple prior radiographs. Electronically Signed   By: Neita Garnet M.D.   On: 04/10/2023 15:37     Assessment and Plan:   MSK chest pain- pain is reproducible on exam, troponin 25-->25. Not ACS.  Sinus tachycardia Acute heart failure with reduced ejection fraction-- BNP 602.3, JVD elevated, pulmonary edema present  Paroxysmal atrial fibrillation on apixaban  Saccular aneurysm of infrarenal abdominal aorta Ascending aortic aneurysm s/p repair  RBBB LAFB - Agree with limited 2D echo - Pain control for MSK chest pain - Infectious work up for enteritis/colitis - IV furosemide 40mg  as patient is volume up - Continue other home cardiac medications    For questions or updates, please contact [facility] Please consult [email or web address] for contact info under    Signed, Lavonia Dana MD [facility] 04/10/2023 9:51 PM

## 2023-04-10 NOTE — ED Notes (Signed)
Patient keeps removing monitoring equipment.

## 2023-04-10 NOTE — ED Notes (Signed)
Daughter [name] [phone] would like an update asap

## 2023-04-10 NOTE — ED Notes (Signed)
Patient is yelling for help, call bell is in place, went into the room to see what he needs, asked if he was having chest pain, picked up his cell phone that was laying in the floor, he is yelling he needs to go to the bathroom, offered the urinal that was within his reach, he advises no, the bathroom, advised he is on oxygen and it's a little bit of a walk, offered a wheelchair while disconnecting his monitoring equipment, patient is still yelling bathroom, advised yes I know, I am working on it, I'm uncovering the patient, tying his shoes, he is still yelling at me, went to get a roll cart for his O2 tank, patient is yelling at me, advised patient I am trying to help him to stop yelling at me, patient yells for me to get out, takes off his gown and throws, kicks his shoes off and walks around the room yelling telling me to leave.  I left the room, patient is still yelling and walking around his room, and at 1737 patient is currently in the restroom.

## 2023-04-10 NOTE — ED Triage Notes (Signed)
Pt to ED via [facility] c/o CP and SHOB x 2 days, progressively getting worse. Home o2 user @4L  baseline. HX CHF, Asthma, HTN, AFIB.  Aspirin 324 mg given by EMS.   Last vs 174/115. HR irregular 128. 98%4L.  #22 L. Hand,

## 2023-04-10 NOTE — H&P (Addendum)
History and Physical  Eddie Graham IRC:[id number] DOB: 1935-04-18 DOA: 04/10/2023  Referring physician: Dr. Karene Fry, EDP PCP: Andreas Blower., MD  Outpatient Specialists: Cardiology. Patient coming from: Home.  Chief Complaint: Chest pain.  HPI: Eddie Graham is a 88 y.o. male with medical history significant for chronic HFrEF with LVEF 40 to 45%(01/27/23), coronary artery disease status post CABG in 2011, high-grade bladder cancer status post TURP, history of sigmoid colon abscess status post end colostomy, anemia of chronic disease, AAA, COPD, chronic hypoxia on 4 L nasal cannula at baseline, CKD 3B, hypertension, hyperlipidemia, paroxysmal A-fib/A-flutter on Eliquis, who presents to the ED with complaints of chest pain today, centrally located, moderate to severe and nonradiating.  Associated with.progressive shortness of breath with minimal exertion that started a few days prior.   Endorses he has been compliant with his home cardiac medications.  The patient lives alone however he has a friend who checks on him frequently and who assists him with his home medications.  For the past couple of days his dyspnea has worsened to the point where he has had to increase his O2 supplementation from 4 L to 5 L.  EMS was activated.  He received a full dose aspirin via EMS.  He presented to the ED for further evaluation.  In the ED, afebrile, hypertensive and tachycardic with increased oxygen requirement.  Lab work notable for elevated BNP greater than 600 above baseline, high-sensitivity troponin flat, 25, 25.  No evidence of acute ischemia on twelve-lead EKG.  CBC is unremarkable.    A CT angio chest/abdomen/pelvis revealed the following: Small bilateral pleural effusions and associated atelectasis and other chronic findings.  The patient received opioid-based IV analgesics.  TRH, hospitalist service, was asked to admit.  ED Course: Temperature 97.6.  BP 174/101, pulse 126,  respiration rate 15, O2 saturation 96% on 4 L.  Review of Systems: Review of systems as noted in the HPI. All other systems reviewed and are negative.   Past Medical History:  Diagnosis Date   AAA (abdominal aortic aneurysm) (HCC)    Anemia    Anxiety    Arthritis    Asthma    Back pain    Cancer (HCC)    Chronic kidney disease    COPD (chronic obstructive pulmonary disease) (HCC)    Depression    Diabetes mellitus without complication (HCC)    Dyspnea    Hypertension    Insomnia    Past Surgical History:  Procedure Laterality Date   BOWEL RESECTION     CARDIAC SURGERY     CYSTOSCOPY WITH URETHRAL DILATATION N/A 09/30/2021   Procedure: CYSTOSCOPY WITH  URETHRAL BALLOON  DILATATION;  Surgeon: Noel Christmas, MD;  Location: [facility];  Service: Urology;  Laterality: N/A;   CYSTOSCOPY WITH URETHRAL DILATATION N/A 12/09/2021   Procedure: CYSTOSCOPY WITH URETHRAL DILATATION;  Surgeon: Noel Christmas, MD;  Location: [facility];  Service: Urology;  Laterality: N/A;  1 HR   CYSTOSCOPY WITH URETHRAL DILATATION N/A 02/09/2023   Procedure: CYSTOSCOPY WITH URETHRAL DILATATION;  Surgeon: Noel Christmas, MD;  Location: [facility];  Service: Urology;  Laterality: N/A;   PROSTATE SURGERY     TRANSURETHRAL RESECTION OF BLADDER TUMOR N/A 09/30/2021   Procedure: TRANSURETHRAL RESECTION OF BLADDER TUMOR (TURBT);  Surgeon: Noel Christmas, MD;  Location: [facility];  Service: Urology;  Laterality: N/A;  90 MINS   TRANSURETHRAL RESECTION OF BLADDER TUMOR N/A 02/09/2023   Procedure: TRANSURETHRAL RESECTION OF BLADDER TUMOR (TURBT);  Surgeon: Noel Christmas, MD;  Location: [facility];  Service: Urology;  Laterality: N/A;    Social History:  reports that he has never smoked. He has never used smokeless tobacco. He reports that he does not currently use alcohol. He reports that he does not currently use drugs.   No Known Allergies  Family history: None reported.  Prior to Admission medications    Medication Sig Start Date End Date Taking? Authorizing Provider  acetaminophen (TYLENOL) 650 MG CR tablet Take 650 mg by mouth every 8 (eight) hours as needed for pain. 03/26/22   [provider]  albuterol (VENTOLIN HFA) 108 (90 Base) MCG/ACT inhaler Inhale 2 puffs into the lungs every 4 (four) hours as needed for wheezing. 11/11/20   [provider]  Ernestina Patches 62.5-25 MCG/ACT AEPB Inhale 1 puff into the lungs daily.    [provider]  apixaban (ELIQUIS) 2.5 MG TABS tablet Take 1 tablet (2.5 mg total) by mouth 2 (two) times daily. 01/29/23   Marguerita Merles Latif, DO  Carboxymethylcellulose Sodium (EYE DROPS OP) Place 2 drops into both eyes as needed (dryness/itching).    [provider]  DULoxetine (CYMBALTA) 60 MG capsule Take 30-60 mg by mouth daily. 02/07/21   [provider]  empagliflozin (JARDIANCE) 10 MG TABS tablet Take 1 tablet (10 mg total) by mouth daily before breakfast. 03/01/23   Almon Hercules, MD  ferrous sulfate 325 (65 FE) MG tablet Take 325 mg by mouth daily with breakfast. 08/21/22   [provider]  furosemide (LASIX) 20 MG tablet Take 1 tablet (20 mg total) by mouth daily. 03/08/23   Almon Hercules, MD  gabapentin (NEURONTIN) 100 MG capsule Take 100-300 mg by mouth at bedtime as needed (leg pain). 02/25/21   [provider]  Insulin Pen Needle (TECHLITE PEN NEEDLES) 32G X 4 MM MISC Use to inject Lantus at bedtime as directed 06/15/22   Rhetta Mura, MD  LANTUS SOLOSTAR 100 UNIT/ML Solostar Pen Inject 15 Units into the skin at bedtime. Patient taking differently: Inject 24 Units into the skin at bedtime. 06/15/22   Rhetta Mura, MD  Melatonin 5 MG CAPS Take 5 mg by mouth at bedtime as needed (sleep). 08/16/19   [provider]  metoprolol succinate (TOPROL-XL) 100 MG 24 hr tablet Take 1 tablet (100 mg total) by mouth daily. Take with or immediately following a meal. 01/30/23   Sheikh, Omair Latif,  DO  montelukast (SINGULAIR) 10 MG tablet Take 10 mg by mouth at bedtime. 03/19/21   [provider]  pantoprazole (PROTONIX) 40 MG tablet Take 1 tablet by mouth 2 times daily. 06/15/22   Rhetta Mura, MD  polyethylene glycol (MIRALAX / GLYCOLAX) 17 g packet Take 17 g by mouth daily as needed for mild constipation. 03/24/22   [provider]  rosuvastatin (CRESTOR) 40 MG tablet Take 40 mg by mouth at bedtime. 04/07/21   [provider]  Semaglutide 14 MG TABS Take 14 mg by mouth daily. 03/14/21   [provider]  tamsulosin (FLOMAX) 0.4 MG CAPS capsule Take 0.4 mg by mouth daily. 11/23/21   [provider]  traMADol (ULTRAM) 50 MG tablet Take 1 tablet (50 mg total) by mouth every 6 (six) hours as needed. 03/01/23 02/29/24  Almon Hercules, MD  traZODone (DESYREL) 50 MG tablet Take 50 mg by mouth at bedtime. 03/14/21   [provider]    Physical Exam: BP (!) 174/101 (BP Location: Left Arm)  Pulse (!) 126   Temp 97.6 F (36.4 C) (Oral)   Resp 15   Wt 98 kg   SpO2 96%   BMI 34.86 kg/m   General: 88 y.o. year-old male well developed well nourished in no acute distress.  Alert and oriented x3. Cardiovascular: Tachycardia with no rubs or gallops.  No thyromegaly or JVD noted.  Trace lower extremity edema, bilaterally. Respiratory: Mild rales at bases.  Poor inspiratory effort. Abdomen: Soft nontender nondistended with normal bowel sounds x4 quadrants. Muskuloskeletal: No cyanosis or clubbing noted bilaterally Neuro: CN II-XII intact, strength, sensation, reflexes Skin: No ulcerative lesions noted or rashes Psychiatry: Judgement and insight appear normal. Mood is appropriate for condition and setting          Labs on Admission:  Basic Metabolic Panel: Recent Labs  Lab 04/10/23 1326  NA 139  K 3.9  CL 106  CO2 23  GLUCOSE 143*  BUN 19  CREATININE 1.74*  CALCIUM 8.7*   Liver Function Tests: Recent Labs  Lab  04/10/23 1326  AST 19  ALT 18  ALKPHOS 80  BILITOT 1.8*  PROT 6.5  ALBUMIN 3.1*   No results for input(s): "LIPASE", "AMYLASE" in the last 168 hours. No results for input(s): "AMMONIA" in the last 168 hours. CBC: Recent Labs  Lab 04/10/23 1326  WBC 8.8  HGB 14.7  HCT 47.2  MCV 90.1  PLT 241   Cardiac Enzymes: No results for input(s): "CKTOTAL", "CKMB", "CKMBINDEX", "TROPONINI" in the last 168 hours.  BNP (last 3 results) Recent Labs    02/25/23 1720 02/28/23 0255 04/10/23 1326  BNP 252.8* 506.3* 602.3*    ProBNP (last 3 results) No results for input(s): "PROBNP" in the last 8760 hours.  CBG: No results for input(s): "GLUCAP" in the last 168 hours.  Radiological Exams on Admission: CT Angio Chest/Abd/Pel for Dissection W and/or Wo Contrast Result Date: 04/10/2023 CLINICAL DATA:  Chest pain and shortness of breath. Acute aortic syndrome suspected EXAM: CT ANGIOGRAPHY CHEST, ABDOMEN AND PELVIS TECHNIQUE: Non-contrast CT of the chest was initially obtained. Multidetector CT imaging through the chest, abdomen and pelvis was performed using the standard protocol during bolus administration of intravenous contrast. Multiplanar reconstructed images and MIPs were obtained and reviewed to evaluate the vascular anatomy. RADIATION DOSE REDUCTION: This exam was performed according to the departmental dose-optimization program which includes automated exposure control, adjustment of the mA and/or kV according to patient size and/or use of iterative reconstruction technique. CONTRAST:  75mL OMNIPAQUE IOHEXOL 350 MG/ML SOLN COMPARISON:  Radiographs earlier today and CT abdomen and pelvis 02/25/2023 FINDINGS: CTA CHEST FINDINGS Cardiovascular: Coronary artery and aortic atherosclerotic calcification. No pericardial effusion. Irregularity about the ascending aortic wall is favored chronic/postoperative. No aortic aneurysm or dissection. Mediastinum/Nodes: Trachea and esophagus are  unremarkable. No thoracic adenopathy. Lungs/Pleura: Emphysema. Small bilateral pleural effusions and associated atelectasis. No pneumothorax. Musculoskeletal: No acute fracture. Review of the MIP images confirms the above findings. CTA ABDOMEN AND PELVIS FINDINGS VASCULAR Aorta: No aneurysm or dissection. Broad-based penetrating atherosclerotic ulcer in the distal infrarenal abdominal aorta just proximal to the bifurcation. The base measures 1.5 x 1.0 cm with 1.0 cm in depth. The maximum aortic width at this site measures 3.3 cm. This is not substantially changed from 02/25/2023. Celiac: No hemodynamically significant stenosis. 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 SMA: Moderate narrowing at the origin secondary to calcified atherosclerotic plaque  no aneurysm or dissection. Renals: Moderate narrowing at the origins of both renal arteries due to calcified atherosclerotic plaque. No aneurysm or dissection. IMA: Patent. Inflow: Scattered calcified atherosclerotic plaque without aneurysm or dissection. Veins: No obvious venous abnormality within the limitations of this arterial phase study. Review of the MIP images confirms the above findings. NON-VASCULAR Hepatobiliary: No focal liver abnormality is seen. No gallstones, gallbladder wall thickening, or biliary dilatation. Pancreas: Unremarkable. Spleen: Unremarkable. Adrenals/Urinary Tract: Stable adrenal glands. No urinary calculi or hydronephrosis. Unremarkable bladder. Stomach/Bowel: Stomach is within normal limits. Normal caliber large and small bowel. Normal appendix. Postoperative change of partial colectomy with Hartmann's pouch and left lower quadrant colostomy. Question mild wall thickening about the colon entering the colostomy. Colonic diverticulosis without diverticulitis. Lymphatic: No lymphadenopathy. Reproductive: Prostate is unremarkable. Other:  Small amount of fluid and mild stranding interposed between the proximal Hartmann's pouch and a loop of small bowel (series 7/image 161). No free intraperitoneal air. Musculoskeletal: No acute fracture. Review of the MIP images confirms the above findings. IMPRESSION: 1. Presumed postoperative change of ascending aortic aneurysm repair. Correlate with surgical history. 2. 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 Recommend referral to or continued care with a vascular specialist. 3. Small bilateral pleural effusions and associated atelectasis. 4. Postoperative change of partial colectomy with left lower quadrant colostomy. Question mild colitis of the colon entering the colostomy. 5. Small amount of fluid and mild stranding interposed between the proximal aspect of the patient's colectomy site and a loop of small bowel. This may be due to enteritis or pouchitis. No abscess or free air. Aortic Atherosclerosis (ICD10-I70.0) and Emphysema (ICD10-J43.9). Electronically Signed   By: Minerva Fester M.D.   On: 04/10/2023 17:43   DG Chest Port 1 View Result Date: 04/10/2023 CLINICAL DATA:  Shortness of breath. EXAM: PORTABLE CHEST 1 VIEW COMPARISON:  Chest radiographs 02/25/2023, 01/28/2020, 01/26/2020, 05/24/2022; CT abdomen and pelvis 07/18/2022 FINDINGS: Status post median sternotomy. Cardiac silhouette is again mildly enlarged. Mediastinal contours are grossly within normal limits for AP technique. Chronic bilateral interstitial thickening and coarsening is unchanged from multiple prior radiographs. Left retrocardiac and costophrenic angle opacity is similar to 02/25/2023 and may represent pleural fluid and/or airspace disease. Note is made of a moderately large epicardial fat pad normally seen in this region on prior CT 07/18/2022. IMPRESSION: 1. Left retrocardiac and costophrenic angle  opacity is similar to 02/25/2023. At least a portion of this appears to represent the patient's normal epicardial fat pad. As on 02/25/2023, it is difficult to exclude superimposed mild left basilar atelectasis and/or pneumonia with possible mild pleural effusion. 2. Chronic bilateral interstitial thickening and coarsening is unchanged from multiple prior radiographs. Electronically Signed   By: Neita Garnet M.D.   On: 04/10/2023 15:37    EKG: I independently viewed the EKG done and my findings are as followed: Sinus tachycardia rate of 129.  Nonspecific ST-T changes.  RBBB, QTc 507.  Assessment/Plan Present on Admission:  Chest pain  Principal Problem:   Chest pain  Atypical chest pain, rule out ACS High-sensitivity troponin flat, 25, 25. No evidence of acute ischemia on twelve-lead EKG Monitor on telemetry. Follow Limited 2D echo Cardiology consulted by EDP. Resume home Eliquis and Crestor.  Acute on chronic HFrEF with LVEF 40 to 45% and wall motion abnormalities. Presented with BNP greater than 600, bilateral pleural effusions and mild pulmonary edema seen on chest x-ray,  lower extremity edema, dyspnea with minimal exertion. Last 2D echo done on 01/27/2023 revealed LVEF 40 to 45% with left ventricle global hypokinesis, severe hypokinesis of the left ventricular, basal mid anteroseptal wall, inferior wall and inferolateral wall. Follow repeat 2D echo, limited. Start strict I's and O's and daily weight.  Acute on chronic hypoxic respiratory failure secondary to bilateral pleural effusions and mild pulmonary edema At baseline on 4 L nasal cannula Oxygen requirement increased to 5 L in the ED Wean off oxygen supplementation as tolerated Early mobilization Maintain O2 saturation above 90%.  Paroxysmal A-flutter on Eliquis Resume home Eliquis for CVA prevention Resume home Toprol-XL for rate control Monitor on telemetry  CKD 3B, at baseline. Appears to be at his baseline renal  function with creatinine 1.74 with GFR 38 Avoid nephrotoxic agents, dehydration, and hypotension Monitor urine output Repeat renal function panel in the morning.  Type 2 diabetes with hyperglycemia Last hemoglobin A1c 7.4 on 01/20/2023. Start insulin sliding scale. Heart healthy, carb modified diet.  Coronary artery disease status post CABG Hypertension Hyperlipidemia Resume home regimen.  Chronic anxiety/depression Resume home Cymbalta  GERD Resume home Protonix  BPH Resume home Flomax Monitor urine output with strict I's and O's.  COPD Resume home bronchodilators Maintain O2 saturation above 90%  Obesity BMI 34 Recommend weight loss outpatient with regular physical activity and healthy diet.  Physical debility PT OT assessment Fall precautions.  QTc prolongation Admission twelve-lead EKG with QTc of 507 Avoid QTc prolonging agents Optimize magnesium and potassium levels Monitor on telemetry  Infrarenal AAA Outpatient follow-up  History of sigmoid abscess status post end colostomy Colostomy care  Incidental findings on CT scan: 1. Presumed postoperative change of ascending aortic aneurysm repair. Correlate with surgical history. 2. 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 Recommend referral to or continued care with a vascular specialist. 3. Postoperative change of partial colectomy with left lower quadrant colostomy. Question mild colitis of the colon entering the colostomy. 4. Small amount of fluid and mild stranding interposed between the proximal aspect of the patient's colectomy site and a loop of small bowel. This may be due to enteritis or pouchitis. No abscess or free air.   Critical care time: 75 minutes.   DVT prophylaxis: Home Eliquis  Code Status: Full code  Family Communication: None at bedside.  Disposition  Plan: Admitted to telemetry cardiac unit.  Consults called: Cardiology consulted by EDP.  Admission status: Inpatient status.   Status is: Inpatient The patient requires at least 2 midnights for further evaluation and treatment of present condition.   Darlin Drop MD [facility] Pager [phone]  If 7PM-7AM, please contact night-coverage [email or web address] Password [facility]  04/10/2023, 8:23 PM

## 2023-04-10 NOTE — ED Provider Notes (Signed)
EMERGENCY DEPARTMENT AT [facility] Provider Note   CSN: [id number] Arrival date & time: 04/10/23  1310     History  Chief Complaint  Patient presents with   Chest Pain   Shortness of Breath    Cristino Bedore is a 88 y.o. male.   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  The patient states that he has had several days of worsening shortness of breath.  He endorses chest discomfort on the left side of his chest.  Symptoms been ongoing for the past 2 days.  He denies back pain, no ripping tearing sensation.  He is on oxygen at home 4 L at baseline.  He was administered aspirin 324 mg with EMS due to his complaint of chest discomfort.  Initial blood pressure with EMS was 174/115.  No fevers, chills, cough.  The patient has difficulty characterizing the pain.  Per his daughter [phone]): He has had a home O2 requirement for the past few months. Recently admitted for pain while breathing, pain in his abdomen. On Thursday, he complained of difficulty breathing. Sx maybe started around Tuesday, increased labored breathing. Had run out of O2 at home per a home health nurse who came to visit him. Had been complaining of chest pain, hurts to breathe.  Hx CABG maybe 30-40 years ago.   Home Medications Prior to Admission medications   Medication Sig Start Date End Date Taking? Authorizing Provider  acetaminophen (TYLENOL) 650 MG CR tablet Take 650 mg by mouth every 8 (eight) hours as needed for pain. 03/26/22  Yes [provider]  albuterol (VENTOLIN HFA) 108 (90 Base) MCG/ACT inhaler Inhale 2 puffs into the lungs  every 4 (four) hours as needed for wheezing. 11/11/20  Yes [provider]  Ernestina Patches 62.5-25 MCG/ACT AEPB Inhale 1 puff into the lungs daily.   Yes [provider]  apixaban (ELIQUIS) 2.5 MG TABS tablet Take 1 tablet (2.5 mg total) by mouth 2 (two) times daily. 01/29/23  Yes Sheikh, Omair Latif, DO  Carboxymethylcellulose Sodium (EYE DROPS OP) Place 2 drops into both eyes as needed (dryness/itching).   Yes [provider]  DULoxetine (CYMBALTA) 60 MG capsule Take 60 mg by mouth daily. 02/07/21  Yes [provider]  empagliflozin (JARDIANCE) 10 MG TABS tablet Take 1 tablet (10 mg total) by mouth daily before breakfast. 03/01/23  Yes Almon Hercules, MD  ferrous sulfate 325 (65 FE) MG tablet Take 325 mg by mouth daily with breakfast. 08/21/22  Yes [provider]  furosemide (LASIX) 20 MG tablet Take 1 tablet (20 mg total) by mouth daily. 03/08/23  Yes Almon Hercules, MD  gabapentin (NEURONTIN) 100 MG capsule Take 100-300 mg by mouth at bedtime as needed (leg pain). 02/25/21  Yes [provider]  LANTUS SOLOSTAR 100 UNIT/ML Solostar Pen Inject 15 Units into the skin at bedtime. Patient taking differently: Inject 24 Units into the skin at bedtime. 06/15/22  Yes Rhetta Mura, MD  Melatonin 5 MG CAPS Take 5 mg by mouth at bedtime as needed (sleep). 08/16/19  Yes [provider]  metoprolol succinate (TOPROL-XL) 100 MG 24 hr tablet Take 1  tablet (100 mg total) by mouth daily. Take with or immediately following a meal. 01/30/23  Yes Sheikh, Omair Latif, DO  montelukast (SINGULAIR) 10 MG tablet Take 10 mg by mouth at bedtime. 03/19/21  Yes [provider]  pantoprazole (PROTONIX) 40 MG tablet Take 1 tablet by mouth 2 times daily. 06/15/22  Yes Rhetta Mura, MD  polyethylene glycol (MIRALAX / GLYCOLAX) 17 g packet Take 17 g by mouth daily as needed for mild constipation. 03/24/22  Yes [provider]  rosuvastatin  (CRESTOR) 40 MG tablet Take 40 mg by mouth at bedtime. 04/07/21  Yes [provider]  Semaglutide 14 MG TABS Take 14 mg by mouth daily. 03/14/21  Yes [provider]  traMADol (ULTRAM) 50 MG tablet Take 1 tablet (50 mg total) by mouth every 6 (six) hours as needed. 03/01/23 02/29/24 Yes Almon Hercules, MD  traZODone (DESYREL) 50 MG tablet Take 50 mg by mouth at bedtime. 03/14/21  Yes [provider]  Insulin Pen Needle (TECHLITE PEN NEEDLES) 32G X 4 MM MISC Use to inject Lantus at bedtime as directed 06/15/22   Rhetta Mura, MD      Allergies    Patient has no known allergies.    Review of Systems   Review of Systems  Respiratory:  Positive for shortness of breath.   Cardiovascular:  Positive for chest pain.    Physical Exam Updated Vital Signs BP 126/83   Pulse (!) 125   Temp 97.8 F (36.6 C) (Oral)   Resp 11   Ht 5\' 6"  (1.676 m)   Wt 97.4 kg   SpO2 97%   BMI 34.65 kg/m  Physical Exam Vitals and nursing note reviewed.  Constitutional:      General: He is not in acute distress.    Appearance: He is well-developed.  HENT:     Head: Normocephalic and atraumatic.  Eyes:     Conjunctiva/sclera: Conjunctivae normal.  Cardiovascular:     Rate and Rhythm: Regular rhythm. Tachycardia present.  Pulmonary:     Effort: Pulmonary effort is normal. Tachypnea present. No respiratory distress.     Breath sounds: Examination of the right-lower field reveals rales. Examination of the left-lower field reveals rales. Rales present.  Abdominal:     Palpations: Abdomen is soft.     Tenderness: There is no abdominal tenderness.  Musculoskeletal:        General: No swelling.     Cervical back: Neck supple.  Skin:    General: Skin is warm and dry.     Capillary Refill: Capillary refill takes less than 2 seconds.  Neurological:     Mental Status: He is alert.  Psychiatric:        Mood and Affect: Mood normal.     ED Results / Procedures / Treatments    Labs (all labs ordered are listed, but only abnormal results are displayed) Labs Reviewed  COMPREHENSIVE METABOLIC PANEL - Abnormal; Notable for the following components:      Result Value   Glucose, Bld 143 (*)    Creatinine, Ser 1.74 (*)    Calcium 8.7 (*)    Albumin 3.1 (*)    Total Bilirubin 1.8 (*)    GFR, Estimated 38 (*)    All other components within normal limits  BRAIN NATRIURETIC PEPTIDE - Abnormal; Notable for the following components:   B Natriuretic Peptide 602.3 (*)    All other components within normal limits  BASIC METABOLIC PANEL - Abnormal; Notable for  the following components:   Glucose, Bld 138 (*)    Creatinine, Ser 1.65 (*)    Calcium 8.6 (*)    GFR, Estimated 40 (*)    All other components within normal limits  URINALYSIS, ROUTINE W REFLEX MICROSCOPIC - Abnormal; Notable for the following components:   APPearance HAZY (*)    Glucose, UA >=500 (*)    Hgb urine dipstick LARGE (*)    Protein, ur 100 (*)    Leukocytes,Ua SMALL (*)    All other components within normal limits  GLUCOSE, CAPILLARY - Abnormal; Notable for the following components:   Glucose-Capillary 135 (*)    All other components within normal limits  TROPONIN I (HIGH SENSITIVITY) - Abnormal; Notable for the following components:   Troponin I (High Sensitivity) 25 (*)    All other components within normal limits  TROPONIN I (HIGH SENSITIVITY) - Abnormal; Notable for the following components:   Troponin I (High Sensitivity) 25 (*)    All other components within normal limits  RESP PANEL BY RT-PCR (RSV, FLU A&B, COVID)  RVPGX2  CULTURE, BLOOD (ROUTINE X 2)  CULTURE, BLOOD (ROUTINE X 2)  CBC  CBC  MAGNESIUM  PHOSPHORUS  GLUCOSE, CAPILLARY  CK  I-STAT VENOUS BLOOD GAS, ED  I-STAT CG4 LACTIC ACID, ED    EKG EKG Interpretation Date/Time:  Saturday April 10 2023 18:11:52 EST Ventricular Rate:  129 PR Interval:  100 QRS Duration:  160 QT Interval:  346 QTC Calculation: 507 R  Axis:   -55  Text Interpretation: Sinus tachycardia Multiform ventricular premature complexes RBBB and LAFB Baseline wander in lead(s) V2 Confirmed by Ernie Avena ([id number]) on 04/10/2023 6:16:11 PM  Radiology CT Angio Chest/Abd/Pel for Dissection W and/or Wo Contrast Result Date: 04/10/2023 CLINICAL DATA:  Chest pain and shortness of breath. Acute aortic syndrome suspected EXAM: CT ANGIOGRAPHY CHEST, ABDOMEN AND PELVIS TECHNIQUE: Non-contrast CT of the chest was initially obtained. Multidetector CT imaging through the chest, abdomen and pelvis was performed using the standard protocol during bolus administration of intravenous contrast. Multiplanar reconstructed images and MIPs were obtained and reviewed to evaluate the vascular anatomy. RADIATION DOSE REDUCTION: This exam was performed according to the departmental dose-optimization program which includes automated exposure control, adjustment of the mA and/or kV according to patient size and/or use of iterative reconstruction technique. CONTRAST:  75mL OMNIPAQUE IOHEXOL 350 MG/ML SOLN COMPARISON:  Radiographs earlier today and CT abdomen and pelvis 02/25/2023 FINDINGS: CTA CHEST FINDINGS Cardiovascular: Coronary artery and aortic atherosclerotic calcification. No pericardial effusion. Irregularity about the ascending aortic wall is favored chronic/postoperative. No aortic aneurysm or dissection. Mediastinum/Nodes: Trachea and esophagus are unremarkable. No thoracic adenopathy. Lungs/Pleura: Emphysema. Small bilateral pleural effusions and associated atelectasis. No pneumothorax. Musculoskeletal: No acute fracture. Review of the MIP images confirms the above findings. CTA ABDOMEN AND PELVIS FINDINGS VASCULAR Aorta: No aneurysm or dissection. Broad-based penetrating atherosclerotic ulcer in the distal infrarenal abdominal aorta just proximal to the bifurcation. The base measures 1.5 x 1.0 cm with 1.0 cm in depth. The maximum aortic width at this site measures  3.3 cm. This is not substantially changed from 02/25/2023. Celiac: No hemodynamically significant stenosis. 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 SMA: Moderate narrowing at the origin secondary to calcified atherosclerotic plaque no aneurysm or dissection. Renals: Moderate narrowing at the origins of both renal arteries due to calcified atherosclerotic plaque. No aneurysm or dissection. IMA:  Patent. Inflow: Scattered calcified atherosclerotic plaque without aneurysm or dissection. Veins: No obvious venous abnormality within the limitations of this arterial phase study. Review of the MIP images confirms the above findings. NON-VASCULAR Hepatobiliary: No focal liver abnormality is seen. No gallstones, gallbladder wall thickening, or biliary dilatation. Pancreas: Unremarkable. Spleen: Unremarkable. Adrenals/Urinary Tract: Stable adrenal glands. No urinary calculi or hydronephrosis. Unremarkable bladder. Stomach/Bowel: Stomach is within normal limits. Normal caliber large and small bowel. Normal appendix. Postoperative change of partial colectomy with Hartmann's pouch and left lower quadrant colostomy. Question mild wall thickening about the colon entering the colostomy. Colonic diverticulosis without diverticulitis. Lymphatic: No lymphadenopathy. Reproductive: Prostate is unremarkable. Other: Small amount of fluid and mild stranding interposed between the proximal Hartmann's pouch and a loop of small bowel (series 7/image 161). No free intraperitoneal air. Musculoskeletal: No acute fracture. Review of the MIP images confirms the above findings. IMPRESSION: 1. Presumed postoperative change of ascending aortic aneurysm repair. Correlate with surgical history. 2. 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 Recommend referral to or continued care with a vascular specialist. 3. Small bilateral pleural effusions and associated atelectasis. 4. Postoperative change of partial colectomy with left lower quadrant colostomy. Question mild colitis of the colon entering the colostomy. 5. Small amount of fluid and mild stranding interposed between the proximal aspect of the patient's colectomy site and a loop of small bowel. This may be due to enteritis or pouchitis. No abscess or free air. Aortic Atherosclerosis (ICD10-I70.0) and Emphysema (ICD10-J43.9). Electronically Signed   By: Minerva Fester M.D.   On: 04/10/2023 17:43   DG Chest Port 1 View Result Date: 04/10/2023 CLINICAL DATA:  Shortness of breath. EXAM: PORTABLE CHEST 1 VIEW COMPARISON:  Chest radiographs 02/25/2023, 01/28/2020, 01/26/2020, 05/24/2022; CT abdomen and pelvis 07/18/2022 FINDINGS: Status post median sternotomy. Cardiac silhouette is again mildly enlarged. Mediastinal contours are grossly within normal limits for AP technique. Chronic bilateral interstitial thickening and coarsening is unchanged from multiple prior radiographs. Left retrocardiac and costophrenic angle opacity is similar to 02/25/2023 and may represent pleural fluid and/or airspace disease. Note is made of a moderately large epicardial fat pad normally seen in this region on prior CT 07/18/2022. IMPRESSION: 1. Left retrocardiac and costophrenic angle opacity is similar to 02/25/2023. At least a portion of this appears to represent the patient's normal epicardial fat pad. As on 02/25/2023, it is difficult to exclude superimposed mild left basilar atelectasis and/or pneumonia with possible mild pleural effusion. 2. Chronic bilateral interstitial thickening and coarsening is unchanged from multiple prior radiographs. Electronically Signed   By: Neita Garnet M.D.   On: 04/10/2023 15:37    Procedures Procedures    Medications Ordered in ED Medications  insulin aspart  (novoLOG) injection 0-9 Units (1 Units Subcutaneous Given 04/11/23 0846)  insulin aspart (novoLOG) injection 0-5 Units ( Subcutaneous Not Given 04/10/23 2230)  acetaminophen (TYLENOL) tablet 650 mg (has no administration in time range)  prochlorperazine (COMPAZINE) injection 5 mg (has no administration in time range)  melatonin tablet 5 mg (5 mg Oral Given 04/10/23 2214)  polyethylene glycol (MIRALAX / GLYCOLAX) packet 17 g (has no administration in time range)  apixaban (ELIQUIS) tablet 2.5 mg (0 mg Oral Hold 04/11/23 0946)  umeclidinium-vilanterol (ANORO ELLIPTA) 62.5-25 MCG/ACT 1 puff (1 puff Inhalation Given 04/11/23 0802)  DULoxetine (CYMBALTA) DR capsule 60 mg (60 mg Oral Given by Other 04/11/23 0934)  montelukast (SINGULAIR) tablet 10  mg (10 mg Oral Given 04/10/23 2211)  pantoprazole (PROTONIX) EC tablet 40 mg (40 mg Oral Given 04/11/23 0937)  rosuvastatin (CRESTOR) tablet 40 mg (40 mg Oral Given 04/10/23 2212)  tamsulosin (FLOMAX) capsule 0.4 mg (0.4 mg Oral Given 04/11/23 0945)  alum & mag hydroxide-simeth (MAALOX/MYLANTA) 200-200-20 MG/5ML suspension 30 mL (30 mLs Oral Given 04/10/23 2203)  furosemide (LASIX) injection 60 mg (has no administration in time range)  metoprolol succinate (TOPROL-XL) 24 hr tablet 25 mg (has no administration in time range)  morphine (PF) 4 MG/ML injection 4 mg (4 mg Intravenous Given 04/10/23 1404)  iohexol (OMNIPAQUE) 350 MG/ML injection 75 mL (75 mLs Intravenous Contrast Given 04/10/23 1540)  fentaNYL (SUBLIMAZE) injection 50 mcg (50 mcg Intravenous Given 04/10/23 1758)  fentaNYL (SUBLIMAZE) injection 12.5-50 mcg (50 mcg Intravenous Given 04/11/23 0416)  furosemide (LASIX) injection 40 mg (40 mg Intravenous Given 04/10/23 2341)  pantoprazole (PROTONIX) injection 40 mg (40 mg Intravenous Given 04/11/23 0150)  HYDROmorphone (DILAUDID) injection 0.5-1 mg (1 mg Intravenous Given 04/11/23 0933)    ED Course/ Medical Decision Making/ A&P                                  Medical Decision Making Amount and/or Complexity of Data Reviewed Labs: ordered. Radiology: ordered.  Risk Prescription drug management. Decision regarding hospitalization.      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  The patient states that he has had several days of worsening shortness of breath.  He endorses chest discomfort on the left side of his chest.  Symptoms been ongoing for the past 2 days.  He denies back pain, no ripping tearing sensation.  He is on oxygen at home 4 L at baseline.  He was administered aspirin 324 mg with EMS due to his complaint of chest discomfort.  Initial blood pressure with EMS was 174/115.  No fevers, chills, cough.  The patient has difficulty characterizing the pain.  Per his daughter [phone]): He has had a home O2 requirement for the past few months. Recently admitted for pain while breathing, pain in his abdomen. On Thursday, he complained of difficulty breathing. Sx maybe started around Tuesday, increased labored breathing. Had run out of O2 at home per a home health nurse who came to visit him. Had been complaining of chest pain, hurts to breathe.  Hx CABG maybe 30-40 years ago.   On arrival, the patient was afebrile, tachycardic with sinus tachycardia noted on cardiac telemetry, heart rate in the 120s to 130s, hypertensive, saturating well on his 4 L home O2.  Physical exam revealed tachycardia and tachypnea, mild rales noted bibasilar.  EKG revealed sinus tachycardia, ventricular rate 129, no acute ischemic changes.  Chest x-ray showed the following: IMPRESSION:  1. Left retrocardiac and costophrenic angle opacity is similar to  02/25/2023. At least a portion of this appears to represent the  patient's  normal epicardial fat pad. As on 02/25/2023, it is  difficult to exclude superimposed mild left basilar atelectasis  and/or pneumonia with possible mild pleural effusion.  2. Chronic bilateral interstitial thickening and coarsening is  unchanged from multiple prior radiographs.    Laboratory evaluation revealed cardiac troponins flat at 25, BNP elevated to 602, COVID  and influenza PCR testing negative, CBC without a leukocytosis or anemia, CMP with a serum creatinine at baseline at 1.74, no significant electrolyte abnormality.  Dissection study obtained: IMPRESSION:  1. Presumed postoperative change of ascending aortic aneurysm  repair. Correlate with surgical history.  2. Saccular aneurysm or penetrating atherosclerotic ulcer of the  infrarenal abdominal aorta is unchanged from 02/25/2023 and again  measures 3.3 cm in maximum diameter. Recommend referral to or  continued care with a vascular specialist.  3. Small bilateral pleural effusions and associated atelectasis.  4. Postoperative change of partial colectomy with left lower  quadrant colostomy. Question mild colitis of the colon entering the  colostomy.  5. Small amount of fluid and mild stranding interposed between the  proximal aspect of the patient's colectomy site and a loop of small  bowel. This may be due to enteritis or pouchitis. No abscess or free  air.    Discussed with on-call cardiology regarding the patient given his ongoing chest discomfort, flat troponins, history of CHF.  Cardiology will see the patient in consultation, spoke with Dr. Cory Munch.  Dr. Margo Aye of hospitalist medicine consulted and accepted the patient in admission for further workup.  Patient admitted tachycardic but otherwise in stable condition on his home O2.   Final Clinical Impression(s) / ED Diagnoses Final diagnoses:  Chest pain, unspecified type  SOB (shortness of breath)  Abdominal aortic aneurysm (AAA) without rupture, unspecified part (HCC)   Hx of CABG  Coronary artery disease involving native heart with angina pectoris, unspecified vessel or lesion type (HCC)  Acute on chronic congestive heart failure, unspecified heart failure type [facility])    Rx / DC Orders ED Discharge Orders     None         Ernie Avena, MD 04/11/23 1215

## 2023-04-11 DIAGNOSIS — I5023 Acute on chronic systolic (congestive) heart failure: Secondary | ICD-10-CM

## 2023-04-11 DIAGNOSIS — R079 Chest pain, unspecified: Secondary | ICD-10-CM | POA: Diagnosis not present

## 2023-04-11 LAB — GLUCOSE, CAPILLARY
Glucose-Capillary: 135 mg/dL — ABNORMAL HIGH (ref 70–99)
Glucose-Capillary: 176 mg/dL — ABNORMAL HIGH (ref 70–99)
Glucose-Capillary: 106 mg/dL — ABNORMAL HIGH (ref 70–99)
Glucose-Capillary: 190 mg/dL — ABNORMAL HIGH (ref 70–99)

## 2023-04-11 LAB — URINALYSIS, ROUTINE W REFLEX MICROSCOPIC
Bacteria, UA: NONE SEEN
Bilirubin Urine: NEGATIVE
Glucose, UA: >=500 mg/dL — AB
Ketones, ur: NEGATIVE mg/dL
Nitrite: NEGATIVE
Protein, ur: 100 mg/dL — AB
RBC / HPF: >50 RBC/hpf (ref 0–5)
Specific Gravity, Urine: 1.017 (ref 1.005–1.030)
WBC, UA: >50 WBC/hpf (ref 0–5)
pH: 5 (ref 5.0–8.0)

## 2023-04-11 LAB — MAGNESIUM: Magnesium: 2 mg/dL (ref 1.7–2.4)

## 2023-04-11 LAB — CBC
HCT: 44.8 % (ref 39.0–52.0)
Hemoglobin: 14.3 g/dL (ref 13.0–17.0)
MCH: 28.6 pg (ref 26.0–34.0)
MCHC: 31.9 g/dL (ref 30.0–36.0)
MCV: 89.6 fL (ref 80.0–100.0)
Platelets: 208 10*3/uL (ref 150–400)
RBC: 5 MIL/uL (ref 4.22–5.81)
RDW: 15.2 % (ref 11.5–15.5)
WBC: 7.6 10*3/uL (ref 4.0–10.5)
nRBC: 0 % (ref 0.0–0.2)

## 2023-04-11 LAB — BASIC METABOLIC PANEL
Anion gap: 10 (ref 5–15)
BUN: 22 mg/dL (ref 8–23)
CO2: 25 mmol/L (ref 22–32)
Calcium: 8.6 mg/dL — ABNORMAL LOW (ref 8.9–10.3)
Chloride: 104 mmol/L (ref 98–111)
Creatinine, Ser: 1.65 mg/dL — ABNORMAL HIGH (ref 0.61–1.24)
GFR, Estimated: 40 mL/min — ABNORMAL LOW (ref >60)
Glucose, Bld: 138 mg/dL — ABNORMAL HIGH (ref 70–99)
Potassium: 3.9 mmol/L (ref 3.5–5.1)
Sodium: 139 mmol/L (ref 135–145)

## 2023-04-11 LAB — PHOSPHORUS: Phosphorus: 4 mg/dL (ref 2.5–4.6)

## 2023-04-11 LAB — CK: Total CK: 140 U/L (ref 49–397)

## 2023-04-11 MED ORDER — FUROSEMIDE 10 MG/ML IJ SOLN: 40.0000 mg | Freq: Once | INTRAMUSCULAR | Status: DC

## 2023-04-11 MED ORDER — METOPROLOL SUCCINATE ER 25 MG PO TB24
25.0000 mg | ORAL_TABLET | Freq: Two times a day (BID) | ORAL | Status: DC
Start: 2023-04-11 — End: 2023-04-12
  Administered 2023-04-11: 25 mg via ORAL
  Filled 2023-04-11 (×2): qty 1

## 2023-04-11 MED ORDER — PANTOPRAZOLE SODIUM 40 MG IV SOLR
40.0000 mg | Freq: Once | INTRAVENOUS | Status: AC
  Administered 2023-04-11: 40 mg via INTRAVENOUS
  Filled 2023-04-11: qty 10

## 2023-04-11 MED ORDER — FUROSEMIDE 10 MG/ML IJ SOLN
60.0000 mg | Freq: Two times a day (BID) | INTRAMUSCULAR | Status: DC
  Administered 2023-04-11 (×2): 60 mg via INTRAVENOUS
  Filled 2023-04-11 (×2): qty 6

## 2023-04-11 MED ORDER — HYDROMORPHONE HCL 1 MG/ML IJ SOLN
0.5000 mg | INTRAMUSCULAR | Status: AC | PRN
  Administered 2023-04-11 (×2): 1 mg via INTRAVENOUS
  Filled 2023-04-11 (×2): qty 1

## 2023-04-11 NOTE — Progress Notes (Signed)
Patient noted to have bloody urine. Eliquis held and Dr. Dartha Lodge notified

## 2023-04-11 NOTE — Progress Notes (Signed)
PROGRESS NOTE    Eddie Graham  UEA:[id number] DOB: November 06, 1934 DOA: 04/10/2023 PCP: Andreas Blower., MD  Outpatient Specialists:     Brief Narrative:  Patient is an 88 year old male with past medical history significant for heart failure with reduced EF (EF of 40 to 45%), s/p CABG, bladder cancer status post TURP, sigmoid colon abscess s/p end colostomy, hypertension, hyperlipidemia, paroxysmal atrial fibrillation on Eliquis, anemia, AAA and chronic kidney disease stage IIIb.  Patient was admitted with chest pain and shortness of breath.  Troponin on presentation was 25.  Cardiology team is directing care.  For TEE/DCCV once euvolemic.  Plan is to continue rate control and Eliquis for now.  Adequate diuresis.  Chest pain is considered atypical.  Patient is a poor historian.   Assessment & Plan:   Principal Problem:   Chest pain   Atypical chest pain, rule out ACS -High-sensitivity troponin flat, 25, 25. -No evidence of acute ischemia on twelve-lead EKG -Continue to monitor on telemetry. -Follow Limited 2D echo -Cardiology team is directing care.   -Continue home Eliquis and Crestor.   Acute on chronic HFrEF with LVEF 40 to 45% and wall motion abnormalities. -BNP greater than 600, bilateral pleural effusions and mild pulmonary edema -Lower extremity edema, dyspnea with minimal exertion. -Last 2D echo done on 01/27/2023 revealed LVEF 40 to 45% with left ventricle global hypokinesis, severe hypokinesis of the left ventricular, basal mid anteroseptal wall, inferior wall and inferolateral wall. -Follow repeat 2D echo, limited. -Adequate diuresis. -Cardiology is directing care. -Strict I's and O's and daily weight.   Acute on chronic hypoxic respiratory failure secondary to bilateral pleural effusions and mild pulmonary edema -At baseline, patient is on 4 L nasal cannula -Oxygen requirement increased to 5 L in the ED -Wean off oxygen supplementation as  tolerated -Maintain O2 saturation above 91%.   Paroxysmal A-flutter on Eliquis -Continue Eliquis. -Rate controlled.     CKD 3B, at baseline. --Stable.     Type 2 diabetes with hyperglycemia Last hemoglobin A1c 7.4 on 01/20/2023. Continue sliding scale insulin coverage.   Heart healthy, carb modified diet.   Coronary artery disease status post CABG Hypertension Hyperlipidemia Manage expectantly.   Chronic anxiety/depression Resume home Cymbalta   BPH Resume home Flomax Monitor urine output with strict I's and O's.   COPD Resume home bronchodilators Maintain O2 saturation above 90%   Obesity BMI 34 Recommend weight loss outpatient with regular physical activity and healthy diet.   Physical debility PT OT assessment Fall precautions.   QTc prolongation Admission twelve-lead EKG with QTc of 507 Avoid QTc prolonging agents Optimize magnesium and potassium levels Monitor on telemetry   Infrarenal AAA Outpatient follow-up  Vague abdominal pain: -Normal lactic acid. -Check fecal lactoferrin. -Ova and parasites. -Manage expectantly.   History of sigmoid abscess status post end colostomy Colostomy care   Incidental findings on CT scan: 1. Presumed postoperative change of ascending aortic aneurysm repair. Correlate with surgical history. 2. 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 Recommend referral to or continued care with a vascular specialist. 3. Postoperative change of partial colectomy with left lower quadrant colostomy. Question mild colitis of the colon entering the colostomy. 4. Small amount of fluid and mild stranding interposed between the proximal aspect of the patient's colectomy site and a loop of small bowel. This may be due to enteritis or pouchitis. No abscess or free air.   DVT  prophylaxis: Eliquis. Code Status:  Full code Family Communication:  Disposition Plan: This will depend on hospital course.   Consultants:  Cardiology.  Procedures:  None for now.  Antimicrobials:  None.   Subjective: -Poor historian. -No chest pain. -Shortness of breath seems to be improving. -Reports vague abdominal pain. -Loose stool noted in colostomy bag.  Objective: Vitals:   04/11/23 0306 04/11/23 0736 04/11/23 0803 04/11/23 0938  BP: (!) 142/68 115/62  126/83  Pulse: (!) 128 (!) 125  (!) 125  Resp: 18 11    Temp: 97.7 F (36.5 C) 97.8 F (36.6 C)    TempSrc: Oral Oral    SpO2: 98% 96% 97%   Weight: 97.4 kg     Height:        Intake/Output Summary (Last 24 hours) at 04/11/2023 1032 Last data filed at 04/11/2023 0604 Gross per 24 hour  Intake 720 ml  Output 1325 ml  Net -605 ml   Filed Weights   04/10/23 1320 04/10/23 2057 04/11/23 0306  Weight: 98 kg 97.4 kg 97.4 kg    Examination:  General exam: Appears calm and comfortable.  Patient is obese. Respiratory system: Clear to auscultation.  Cardiovascular system: S1 & S2 Gastrointestinal system: Abdomen is obese.  Patient has colostomy bag.  Vague abdominal tenderness.   Central nervous system: Awake and alert.    Data Reviewed: I have personally reviewed following labs and imaging studies  CBC: Recent Labs  Lab 04/10/23 1326 04/11/23 0506  WBC 8.8 7.6  HGB 14.7 14.3  HCT 47.2 44.8  MCV 90.1 89.6  PLT 241 208   Basic Metabolic Panel: Recent Labs  Lab 04/10/23 1326 04/11/23 0506  NA 139 139  K 3.9 3.9  CL 106 104  CO2 23 25  GLUCOSE 143* 138*  BUN 19 22  CREATININE 1.74* 1.65*  CALCIUM 8.7* 8.6*  MG  --  2.0  PHOS  --  4.0   GFR: Estimated Creatinine Clearance: 35.1 mL/min (A) (by C-G formula based on SCr of 1.65 mg/dL (H)). Liver Function Tests: Recent Labs  Lab 04/10/23 1326  AST 19  ALT 18  ALKPHOS 80  BILITOT 1.8*  PROT 6.5  ALBUMIN 3.1*   No results for input(s): "LIPASE", "AMYLASE" in the last  168 hours. No results for input(s): "AMMONIA" in the last 168 hours. Coagulation Profile: No results for input(s): "INR", "PROTIME" in the last 168 hours. Cardiac Enzymes: No results for input(s): "CKTOTAL", "CKMB", "CKMBINDEX", "TROPONINI" in the last 168 hours. BNP (last 3 results) No results for input(s): "PROBNP" in the last 8760 hours. HbA1C: No results for input(s): "HGBA1C" in the last 72 hours. CBG: Recent Labs  Lab 04/10/23 2204 04/11/23 0738  GLUCAP 92 135*   Lipid Profile: No results for input(s): "CHOL", "HDL", "LDLCALC", "TRIG", "CHOLHDL", "LDLDIRECT" in the last 72 hours. Thyroid Function Tests: No results for input(s): "TSH", "T4TOTAL", "FREET4", "T3FREE", "THYROIDAB" in the last 72 hours. Anemia Panel: No results for input(s): "VITAMINB12", "FOLATE", "FERRITIN", "TIBC", "IRON", "RETICCTPCT" in the last 72 hours. Urine analysis:    Component Value Date/Time   COLORURINE YELLOW 04/11/2023 0154   APPEARANCEUR HAZY (A) 04/11/2023 0154   LABSPEC 1.017 04/11/2023 0154   PHURINE 5.0 04/11/2023 0154   GLUCOSEU >=500 (A) 04/11/2023 0154   HGBUR LARGE (A) 04/11/2023 0154   BILIRUBINUR NEGATIVE 04/11/2023 0154   KETONESUR NEGATIVE 04/11/2023 0154   PROTEINUR 100 (A) 04/11/2023 0154   NITRITE NEGATIVE 04/11/2023 0154   LEUKOCYTESUR SMALL (A) 04/11/2023 0154  Sepsis Labs: @LABRCNTIP (procalcitonin:4,lacticidven:4)  ) Recent Results (from the past 240 hours)  Resp panel by RT-PCR (RSV, Flu A&B, Covid) Anterior Nasal Swab     Status: None   Collection Time: 04/10/23  1:26 PM   Specimen: Anterior Nasal Swab  Result Value Ref Range Status   SARS Coronavirus 2 by RT PCR NEGATIVE NEGATIVE Final   Influenza A by PCR NEGATIVE NEGATIVE Final   Influenza B by PCR NEGATIVE NEGATIVE Final    Comment: (NOTE) The Xpert Xpress SARS-CoV-2/FLU/RSV plus assay is intended as an aid in the diagnosis of influenza from Nasopharyngeal swab specimens and should not be used as a sole  basis for treatment. Nasal washings and aspirates are unacceptable for Xpert Xpress SARS-CoV-2/FLU/RSV testing.  Fact Sheet for Patients: BloggerCourse.com  Fact Sheet for Healthcare Providers: SeriousBroker.it  This test is not yet approved or cleared by the [place] FDA and has been authorized for detection and/or diagnosis of SARS-CoV-2 by FDA under an Emergency Use Authorization (EUA). This EUA will remain in effect (meaning this test can be used) for the duration of the COVID-19 declaration under Section 564(b)(1) of the Act, 21 U.S.C. section 360bbb-3(b)(1), unless the authorization is terminated or revoked.     Resp Syncytial Virus by PCR NEGATIVE NEGATIVE Final    Comment: (NOTE) Fact Sheet for Patients: BloggerCourse.com  Fact Sheet for Healthcare Providers: SeriousBroker.it  This test is not yet approved or cleared by the [place] FDA and has been authorized for detection and/or diagnosis of SARS-CoV-2 by FDA under an Emergency Use Authorization (EUA). This EUA will remain in effect (meaning this test can be used) for the duration of the COVID-19 declaration under Section 564(b)(1) of the Act, 21 U.S.C. section 360bbb-3(b)(1), unless the authorization is terminated or revoked.  Performed at [facility], [address], [place], [place] [address]   Blood culture (routine x 2)     Status: None (Preliminary result)   Collection Time: 04/10/23  1:29 PM   Specimen: BLOOD  Result Value Ref Range Status   Specimen Description BLOOD RIGHT ANTECUBITAL  Final   Special Requests   Final    BOTTLES DRAWN AEROBIC AND ANAEROBIC Blood Culture results may not be optimal due to an inadequate volume of blood received in culture bottles   Culture   Final    NO GROWTH < 24 HOURS Performed at [facility], [address] [place], [place] [address]    Report  Status PENDING  Incomplete  Blood culture (routine x 2)     Status: None (Preliminary result)   Collection Time: 04/10/23  1:34 PM   Specimen: BLOOD LEFT WRIST  Result Value Ref Range Status   Specimen Description BLOOD LEFT WRIST  Final   Special Requests   Final    BOTTLES DRAWN AEROBIC AND ANAEROBIC Blood Culture adequate volume   Culture   Final    NO GROWTH < 24 HOURS Performed at [facility], [address], [place], [place] [address]    Report Status PENDING  Incomplete         Radiology Studies: CT Angio Chest/Abd/Pel for Dissection W and/or Wo Contrast Result Date: 04/10/2023 CLINICAL DATA:  Chest pain and shortness of breath. Acute aortic syndrome suspected EXAM: CT ANGIOGRAPHY CHEST, ABDOMEN AND PELVIS TECHNIQUE: Non-contrast CT of the chest was initially obtained. Multidetector CT imaging through the chest, abdomen and pelvis was performed using the standard protocol during bolus administration of intravenous contrast. Multiplanar  reconstructed images and MIPs were obtained and reviewed to evaluate the vascular anatomy. RADIATION DOSE REDUCTION: This exam was performed according to the departmental dose-optimization program which includes automated exposure control, adjustment of the mA and/or kV according to patient size and/or use of iterative reconstruction technique. CONTRAST:  75mL OMNIPAQUE IOHEXOL 350 MG/ML SOLN COMPARISON:  Radiographs earlier today and CT abdomen and pelvis 02/25/2023 FINDINGS: CTA CHEST FINDINGS Cardiovascular: Coronary artery and aortic atherosclerotic calcification. No pericardial effusion. Irregularity about the ascending aortic wall is favored chronic/postoperative. No aortic aneurysm or dissection. Mediastinum/Nodes: Trachea and esophagus are unremarkable. No thoracic adenopathy. Lungs/Pleura: Emphysema. Small bilateral pleural effusions and associated atelectasis. No pneumothorax. Musculoskeletal: No acute fracture. Review of the MIP images  confirms the above findings. CTA ABDOMEN AND PELVIS FINDINGS VASCULAR Aorta: No aneurysm or dissection. Broad-based penetrating atherosclerotic ulcer in the distal infrarenal abdominal aorta just proximal to the bifurcation. The base measures 1.5 x 1.0 cm with 1.0 cm in depth. The maximum aortic width at this site measures 3.3 cm. This is not substantially changed from 02/25/2023. Celiac: No hemodynamically significant stenosis. 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 SMA: Moderate narrowing at the origin secondary to calcified atherosclerotic plaque no aneurysm or dissection. Renals: Moderate narrowing at the origins of both renal arteries due to calcified atherosclerotic plaque. No aneurysm or dissection. IMA: Patent. Inflow: Scattered calcified atherosclerotic plaque without aneurysm or dissection. Veins: No obvious venous abnormality within the limitations of this arterial phase study. Review of the MIP images confirms the above findings. NON-VASCULAR Hepatobiliary: No focal liver abnormality is seen. No gallstones, gallbladder wall thickening, or biliary dilatation. Pancreas: Unremarkable. Spleen: Unremarkable. Adrenals/Urinary Tract: Stable adrenal glands. No urinary calculi or hydronephrosis. Unremarkable bladder. Stomach/Bowel: Stomach is within normal limits. Normal caliber large and small bowel. Normal appendix. Postoperative change of partial colectomy with Hartmann's pouch and left lower quadrant colostomy. Question mild wall thickening about the colon entering the colostomy. Colonic diverticulosis without diverticulitis. Lymphatic: No lymphadenopathy. Reproductive: Prostate is unremarkable. Other: Small amount of fluid and mild stranding interposed between the proximal Hartmann's pouch and a loop of small bowel (series 7/image 161). No free intraperitoneal air. Musculoskeletal: No acute fracture.  Review of the MIP images confirms the above findings. IMPRESSION: 1. Presumed postoperative change of ascending aortic aneurysm repair. Correlate with surgical history. 2. 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 Recommend referral to or continued care with a vascular specialist. 3. Small bilateral pleural effusions and associated atelectasis. 4. Postoperative change of partial colectomy with left lower quadrant colostomy. Question mild colitis of the colon entering the colostomy. 5. Small amount of fluid and mild stranding interposed between the proximal aspect of the patient's colectomy site and a loop of small bowel. This may be due to enteritis or pouchitis. No abscess or free air. Aortic Atherosclerosis (ICD10-I70.0) and Emphysema (ICD10-J43.9). Electronically Signed   By: Minerva Fester M.D.   On: 04/10/2023 17:43   DG Chest Port 1 View Result Date: 04/10/2023 CLINICAL DATA:  Shortness of breath. EXAM: PORTABLE CHEST 1 VIEW COMPARISON:  Chest radiographs 02/25/2023, 01/28/2020, 01/26/2020, 05/24/2022; CT abdomen and pelvis 07/18/2022 FINDINGS: Status post median sternotomy. Cardiac silhouette is again mildly enlarged. Mediastinal contours are grossly within normal limits for AP technique. Chronic bilateral interstitial thickening and coarsening is unchanged from multiple prior radiographs.  Left retrocardiac and costophrenic angle opacity is similar to 02/25/2023 and may represent pleural fluid and/or airspace disease. Note is made of a moderately large epicardial fat pad normally seen in this region on prior CT 07/18/2022. IMPRESSION: 1. Left retrocardiac and costophrenic angle opacity is similar to 02/25/2023. At least a portion of this appears to represent the patient's normal epicardial fat pad. As on 02/25/2023, it is difficult to exclude superimposed mild left basilar  atelectasis and/or pneumonia with possible mild pleural effusion. 2. Chronic bilateral interstitial thickening and coarsening is unchanged from multiple prior radiographs. Electronically Signed   By: Neita Garnet M.D.   On: 04/10/2023 15:37        Scheduled Meds:  apixaban  2.5 mg Oral BID   DULoxetine  60 mg Oral Daily   furosemide  40 mg Intravenous Once   insulin aspart  0-5 Units Subcutaneous QHS   insulin aspart  0-9 Units Subcutaneous TID WC   metoprolol succinate  25 mg Oral Daily   montelukast  10 mg Oral QHS   pantoprazole  40 mg Oral BID   rosuvastatin  40 mg Oral QHS   tamsulosin  0.4 mg Oral Daily   umeclidinium-vilanterol  1 puff Inhalation Daily   Continuous Infusions:   LOS: 1 day    Time spent: 35 minutes.    Berton Mount, MD  [facility] Pager #: [phone] 7PM-7AM contact night coverage as above

## 2023-04-11 NOTE — Progress Notes (Signed)
   Rounding Note    Patient Name: Eddie Graham Date of Encounter: 04/11/2023  [facility] Cardiologist: Rollene Rotunda, MD   Subjective   Interpreter Neill Loft) assisted with this morning's visit.  Many complaints this AM. Shortness of breath is primary complaint.  Vital Signs    Vitals:   04/11/23 0306 04/11/23 0736 04/11/23 0803 04/11/23 0938  BP: (!) 142/68 115/62  126/83  Pulse: (!) 128 (!) 125  (!) 125  Resp: 18 11    Temp: 97.7 F (36.5 C) 97.8 F (36.6 C)    TempSrc: Oral Oral    SpO2: 98% 96% 97%   Weight: 97.4 kg     Height:        Intake/Output Summary (Last 24 hours) at 04/11/2023 1042 Last data filed at 04/11/2023 0604 Gross per 24 hour  Intake 720 ml  Output 1325 ml  Net -605 ml      04/11/2023    3:06 AM 04/10/2023    8:57 PM 04/10/2023    1:20 PM  Last 3 Weights  Weight (lbs) 214 lb 11.2 oz 214 lb 11.2 oz 216 lb  Weight (kg) 97.387 kg 97.387 kg 97.977 kg      Telemetry    AFL with ventricular rate in 120s. - Personally Reviewed  ECG    AFL - Personally Reviewed  Physical Exam   GEN: uncomfortable appearing Cardiac: tachycardic, regular rhythm, no murmurs, rubs, or gallops.  Respiratory: Crackles bilaterally Psych: Normal affect   Assessment & Plan    #Acute on chronic systolic heart failure NYHA III. Volume up on exam. EF 40% - lasix IV BID until volume improves - cont metoprolol succinate 25mg  PO BID - repeat echo pending  #AF/AFL On eliquis Recommend TEE/DCCV once more euvolemic. This has been ordered. Metoprolol as above     Sheria Lang T. Lalla Brothers, MD, [facility], [facility] Cardiac Electrophysiology

## 2023-04-11 NOTE — Evaluation (Signed)
Physical Therapy Evaluation Patient Details Name: Eddie Graham MRN: [id number] DOB: February 04, 1935 Today's Date: 04/11/2023  History of Present Illness  88 y.o. male admitted 04/10/23 with chest pain, SOB; no evidence of acute ischemia. Workup for acute on chronic hypoxic respiratory failure, CHF, bilateral pleural effusions. Plan for TEE/DCCV. PMH includes CAD, COPD, HTN, AAA, CKD, DM, neuropathy, afib/flutter, colostomy, depression, back pain.   Clinical Impression  Pt presents with an overall decrease in functional mobility secondary to above. PTA, pt mod indep with SPC, lives alone with friend who checks on him occasionally. Today, pt moving fairly well with SPC, though demonstrates decreased attention and poor safety awareness. Pt would benefit from continued acute PT services to maximize functional mobility and independence prior to d/c with HHPT.       If plan is discharge home, recommend the following: A little help with bathing/dressing/bathroom;Assistance with cooking/housework;Direct supervision/assist for medications management;Direct supervision/assist for financial management;Assist for transportation   Can travel by private vehicle    Yes    Equipment Recommendations None recommended by PT  Recommendations for Other Services       Functional Status Assessment Patient has had a recent decline in their functional status and demonstrates the ability to make significant improvements in function in a reasonable and predictable amount of time.     Precautions / Restrictions Precautions Precautions: Fall;Other (comment) Precaution Comments: colostomy; watch SpO2 (wears supplemental O2 PRN at home) Restrictions Weight Bearing Restrictions Per Provider Order: No      Mobility  Bed Mobility Overal bed mobility: Modified Independent             General bed mobility comments: HOB elevated    Transfers Overall transfer level: Needs assistance Equipment used:  None, Straight cane Transfers: Sit to/from Stand Sit to Stand: Supervision, Contact guard assist           General transfer comment: supervision standing from bed and recliner with SPC; CGA with sit<>stand from recliner without DME, pt reaching to table for UE support    Ambulation/Gait Ambulation/Gait assistance: Supervision Gait Distance (Feet): 40 Feet Assistive device: Straight cane Gait Pattern/deviations: Step-through pattern, Decreased stride length Gait velocity: Decreased     General Gait Details: mostly steady gait to bathroom and throughout room with [facility] and supervision for safety/lines; pt easily distracted requiring intermittent cues for safety and to attend to current task  Stairs            Wheelchair Mobility     Tilt Bed    Modified Rankin (Stroke Patients Only)       Balance Overall balance assessment: Needs assistance Sitting-balance support: No upper extremity supported, Feet supported Sitting balance-Leahy Scale: Good       Standing balance-Leahy Scale: Fair Standing balance comment: can stand at toilet to void and sink to wash hands without UE support                             Pertinent Vitals/Pain Pain Assessment Pain Assessment: Faces Faces Pain Scale: Hurts a little bit Pain Location: generalized Pain Descriptors / Indicators: Discomfort Pain Intervention(s): Monitored during session    Home Living Family/patient expects to be discharged to:: Private residence Living Arrangements: Alone Available Help at Discharge: Friend(s);Available PRN/intermittently Type of Home: Apartment Home Access: Level entry       Home Layout: One level Home Equipment: Agricultural consultant (2 wheels);Cane - single point;Electric scooter      Prior Function Prior  Level of Function : Independent/Modified Independent             Mobility Comments: mod indep with SPC and intermittent use of SPC. lives alone with friend who checks on  him as needed. does not drive ADLs Comments: mod indep with ADLs, including ostomy management. pt unsure of how much O2 he wears at home     Extremity/Trunk Assessment   Upper Extremity Assessment Upper Extremity Assessment: Overall WFL for tasks assessed    Lower Extremity Assessment Lower Extremity Assessment: Overall WFL for tasks assessed       Communication   Communication Communication: No apparent difficulties;Other (comment) (Spanish is primary language but able to communicate fairly well via [place])  Cognition Arousal: Alert Behavior During Therapy: WFL for tasks assessed/performed Overall Cognitive Status: No family/caregiver present to determine baseline cognitive functioning                                 General Comments: following majority of simple commands and answering questions appropriately. poor safety awareness and insight into deficits; ex. pt emptying ostomy contents into plastic hat in toilet then walking through room without washing hands though clearly soiled, requiring max cues to wash hands and set up to change soiled underwear and gown with poor awareness of need to do that. pt joking and laughing frequently throughout session wtih decreased attention requiring redirection to current task/conversation topic        General Comments General comments (skin integrity, edema, etc.): HR 125-126 during session; SpO2 94% on 2L O2 [place] (titrated down from 5L). educ re: POC, activity recommendations, fall risk reduction, discharge needs - pt with poor attention/focus during conversations, unsure true intake of information    Exercises     Assessment/Plan    PT Assessment Patient needs continued PT services  PT Problem List Decreased activity tolerance;Decreased balance;Decreased mobility;Decreased cognition;Decreased safety awareness;Cardiopulmonary status limiting activity       PT Treatment Interventions DME instruction;Gait training;Stair  training;Functional mobility training;Therapeutic activities;Therapeutic exercise;Balance training;Patient/family education    PT Goals (Current goals can be found in the Care Plan section)  Acute Rehab PT Goals Patient Stated Goal: feel better PT Goal Formulation: With patient Time For Goal Achievement: 04/25/23 Potential to Achieve Goals: Good    Frequency Min 1X/week     Co-evaluation               AM-PAC PT "6 Clicks" Mobility  Outcome Measure Help needed turning from your back to your side while in a flat bed without using bedrails?: None Help needed moving from lying on your back to sitting on the side of a flat bed without using bedrails?: None Help needed moving to and from a bed to a chair (including a wheelchair)?: A Little Help needed standing up from a chair using your arms (e.g., wheelchair or bedside chair)?: A Little Help needed to walk in hospital room?: A Little Help needed climbing 3-5 steps with a railing? : A Little 6 Click Score: 20    End of Session   Activity Tolerance: Patient tolerated treatment well Patient left: in chair;with call bell/phone within reach;with chair alarm set Nurse Communication: Mobility status PT Visit Diagnosis: Other abnormalities of gait and mobility (R26.89)    Time: 1610-9604 PT Time Calculation (min) (ACUTE ONLY): 17 min   Charges:   PT Evaluation $PT Eval Low Complexity: 1 Low   PT General Charges $$ ACUTE PT  VISIT: 1 Visit       Ina Homes, PT, DPT Acute Rehabilitation Services  Personal: Secure Chat Rehab Office: [phone]  Malachy Chamber 04/11/2023, 11:22 AM

## 2023-04-11 NOTE — Progress Notes (Signed)
Patient complaining of dysuria. Will check UA.

## 2023-04-12 ENCOUNTER — Inpatient Hospital Stay (HOSPITAL_COMMUNITY): Payer: 59

## 2023-04-12 ENCOUNTER — Telehealth (HOSPITAL_COMMUNITY): Payer: Self-pay | Admitting: Emergency Medicine

## 2023-04-12 DIAGNOSIS — I5021 Acute systolic (congestive) heart failure: Secondary | ICD-10-CM

## 2023-04-12 DIAGNOSIS — R079 Chest pain, unspecified: Secondary | ICD-10-CM | POA: Diagnosis not present

## 2023-04-12 DIAGNOSIS — I4892 Unspecified atrial flutter: Secondary | ICD-10-CM

## 2023-04-12 LAB — CBC
HCT: 45.8 % (ref 39.0–52.0)
Hemoglobin: 15 g/dL (ref 13.0–17.0)
MCH: 29 pg (ref 26.0–34.0)
MCHC: 32.8 g/dL (ref 30.0–36.0)
MCV: 88.4 fL (ref 80.0–100.0)
Platelets: 216 10*3/uL (ref 150–400)
RBC: 5.18 MIL/uL (ref 4.22–5.81)
RDW: 15.2 % (ref 11.5–15.5)
WBC: 8.5 10*3/uL (ref 4.0–10.5)
nRBC: 0 % (ref 0.0–0.2)

## 2023-04-12 LAB — BASIC METABOLIC PANEL
Anion gap: 12 (ref 5–15)
BUN: 28 mg/dL — ABNORMAL HIGH (ref 8–23)
CO2: 26 mmol/L (ref 22–32)
Calcium: 8.6 mg/dL — ABNORMAL LOW (ref 8.9–10.3)
Chloride: 99 mmol/L (ref 98–111)
Creatinine, Ser: 1.73 mg/dL — ABNORMAL HIGH (ref 0.61–1.24)
GFR, Estimated: 38 mL/min — ABNORMAL LOW (ref >60)
Glucose, Bld: 147 mg/dL — ABNORMAL HIGH (ref 70–99)
Potassium: 3.6 mmol/L (ref 3.5–5.1)
Sodium: 137 mmol/L (ref 135–145)

## 2023-04-12 LAB — GLUCOSE, CAPILLARY
Glucose-Capillary: 158 mg/dL — ABNORMAL HIGH (ref 70–99)
Glucose-Capillary: 203 mg/dL — ABNORMAL HIGH (ref 70–99)
Glucose-Capillary: 184 mg/dL — ABNORMAL HIGH (ref 70–99)
Glucose-Capillary: 190 mg/dL — ABNORMAL HIGH (ref 70–99)
Glucose-Capillary: 154 mg/dL — ABNORMAL HIGH (ref 70–99)

## 2023-04-12 LAB — ECHOCARDIOGRAM LIMITED
Height: 66 in
S' Lateral: 4 cm
Weight: 3326.3 [oz_av]

## 2023-04-12 LAB — MAGNESIUM: Magnesium: 2.1 mg/dL (ref 1.7–2.4)

## 2023-04-12 MED ORDER — ALBUTEROL (5 MG/ML) CONTINUOUS INHALATION SOLN: 2.5000 mg | INHALATION_SOLUTION | RESPIRATORY_TRACT | Status: DC | PRN

## 2023-04-12 MED ORDER — METOPROLOL SUCCINATE ER 50 MG PO TB24
50.0000 mg | ORAL_TABLET | Freq: Two times a day (BID) | ORAL | Status: DC
  Administered 2023-04-12 – 2023-04-15 (×8): 50 mg via ORAL
  Filled 2023-04-12 (×7): qty 1

## 2023-04-12 MED ORDER — PNEUMOCOCCAL 20-VAL CONJ VACC 0.5 ML IM SUSY
0.5000 mL | PREFILLED_SYRINGE | INTRAMUSCULAR | Status: DC
  Filled 2023-04-12: qty 0.5

## 2023-04-12 MED ORDER — PERFLUTREN LIPID MICROSPHERE
1.0000 mL | INTRAVENOUS | Status: AC | PRN
  Administered 2023-04-12: 2 mL via INTRAVENOUS

## 2023-04-12 MED ORDER — SALINE SPRAY 0.65 % NA SOLN
1.0000 | NASAL | Status: DC | PRN
  Administered 2023-04-12: 1 via NASAL
  Filled 2023-04-12: qty 44

## 2023-04-12 MED ORDER — HYDROMORPHONE HCL 1 MG/ML IJ SOLN
0.5000 mg | INTRAMUSCULAR | Status: AC | PRN
  Administered 2023-04-12 (×2): 1 mg via INTRAVENOUS
  Filled 2023-04-12 (×2): qty 1

## 2023-04-12 MED ORDER — GABAPENTIN 300 MG PO CAPS
300.0000 mg | ORAL_CAPSULE | Freq: Every evening | ORAL | Status: DC | PRN
  Administered 2023-04-12 – 2023-04-16 (×2): 300 mg via ORAL
  Filled 2023-04-12 (×2): qty 1

## 2023-04-12 MED ORDER — MELATONIN 5 MG PO TABS
5.0000 mg | ORAL_TABLET | Freq: Every evening | ORAL | Status: DC | PRN
Start: 2023-04-12 — End: 2023-04-17
  Administered 2023-04-14 – 2023-04-16 (×3): 5 mg via ORAL
  Filled 2023-04-12 (×4): qty 1

## 2023-04-12 MED ORDER — TRAZODONE HCL 50 MG PO TABS
50.0000 mg | ORAL_TABLET | Freq: Every day | ORAL | Status: DC
  Administered 2023-04-12 – 2023-04-16 (×5): 50 mg via ORAL
  Filled 2023-04-12 (×5): qty 1

## 2023-04-12 NOTE — Telephone Encounter (Signed)
PET attestation

## 2023-04-12 NOTE — TOC Initial Note (Signed)
Transition of Care [facility]) - Initial/Assessment Note    Patient Details  Name: Eddie Graham MRN: [id number] Date of Birth: 1934/06/29  [facility] of [facility] CM/SW Contact:    Gala Lewandowsky, RN Phone Number: 04/12/2023, 4:20 PM  Clinical Narrative:  Patient presented for chest pain and hypoxia.  Interpreter called to the bedside to help interpret. PTA patient was from home alone in an apartment. Daughter [name] lives in [place] and she arranges transportation to appointments via [employer] [facility]. Patient has PCP and he gets assistance from a local friend that takes him to get groceries. Case Manager discussed home health services. Daughter states the patient uses [facility]- active with RN and PT). Patient is agreeable to resume home health. Referral made to [facility] and they are aware that patient has been educated to use his concentrator instead of the portable tanks in the home. HH RN to reinforce the education. Patient has oxygen via [employer] and the office is aware to check the concentrator once the patient returns home and to deliver portable tanks to the hospital. Patient is aware that the portables are for appointments/outings outside of the home. Patient has DME rolling walker, cane, and motorized scooter. Case Manager will continue to follow for additional needs as the patient progresses.                     Expected Discharge Plan: Home w Home Health Services Barriers to Discharge: Continued Medical Work up   Patient Goals and CMS Choice Patient states their goals for this hospitalization and ongoing recovery are:: Patient wants to return home   Choice offered to / list presented to : Patient, Adult Children      Expected Discharge Plan and Services   Discharge Planning Services: CM Consult Post Acute Care Choice: Home Health Living arrangements for the past 2 months: Apartment                 DME Arranged: N/A DME Agency: NA       HH Arranged:  RN, PT HH Agency: [facility] Date HH Agency Contacted: 04/12/23 Time HH Agency Contacted: [phone] Representative spoke with at [facility] Agency: Marylene Land  Prior Living Arrangements/Services Living arrangements for the past 2 months: Apartment Lives with:: Self Patient language and need for interpreter reviewed:: Yes (Rochal interprerter.) Do you feel safe going back to the place where you live?: Yes      Need for Family Participation in Patient Care: Yes (Comment) Care giver support system in place?: Yes (comment) Current home services: DME (cane, rolling walker, motorized scotter,) Criminal Activity/Legal Involvement Pertinent to Current Situation/Hospitalization: No - Comment as needed  Activities of Daily Living   ADL Screening (condition at time of admission) Independently performs ADLs?: Yes (appropriate for developmental age) Is the patient deaf or have difficulty hearing?: Yes Does the patient have difficulty seeing, even when wearing glasses/contacts?: Yes Does the patient have difficulty concentrating, remembering, or making decisions?: Yes  Permission Sought/Granted Permission sought to share information with : Case Manager       Permission granted to share info w AGENCY: [facility]        Emotional Assessment Appearance:: Appears stated age Attitude/Demeanor/Rapport: Engaged Affect (typically observed): Appropriate Orientation: : Oriented to Self, Oriented to Place, Oriented to  Time, Oriented to Situation Alcohol / Substance Use: Not Applicable Psych Involvement: No (comment)  Admission diagnosis:  SOB (shortness of breath) [R06.02] 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] Chest pain [R07.9]  Chest pain, unspecified type [R07.9] Coronary artery disease involving native heart with angina pectoris, unspecified vessel or lesion type (HCC) [I25.119] Abdominal aortic aneurysm (AAA) without rupture, unspecified part (HCC) [I71.40] Patient Active Problem List    Diagnosis Date Noted   Atrial flutter (HCC) 04/12/2023   Chest pain 04/10/2023   Community acquired bacterial pneumonia 02/25/2023   New onset a-fib (HCC) 01/26/2023   Lower GI bleeding 06/12/2022   CAD (coronary artery disease) 06/12/2022   CKD (chronic kidney disease) stage 3, GFR 30-59 ml/min (HCC) 06/12/2022   Acute GI bleeding 06/12/2022   Lesion of urinary bladder 06/12/2022   AAA (abdominal aortic aneurysm) (HCC) 06/12/2022   Diverticulitis 06/12/2022   Diverticulitis of large intestine with abscess without bleeding 03/10/2022   Type 2 diabetes mellitus with complication, without long-term current use of insulin (HCC) 12/20/2021   Sigmoid diverticulitis 12/17/2021   Urethral stricture 12/09/2021   Bladder mass 09/30/2021   AAA (abdominal aortic aneurysm) without rupture (HCC) 08/05/2021   PCP:  Andreas Blower., MD Pharmacy:   [facility] - [place], [place] - [address] [place] [address] Phone: [phone] Fax: [phone]  [id number] [place], [place] - [address] AT [facility] OF [facility] [address] [place] [address] Phone: [phone] Fax: [phone]  [facility] [address] [place] [address] Phone: [phone] Fax: [phone]  [facility] [address] [place] [address] Phone: [phone] Fax: [phone]  [facility] #[id number] - [place], [place] - [address] AT [facility] OF [address] [place] [address] Phone: [phone] Fax: [phone]     Social Drivers of Health (SDOH) Social History: SDOH Screenings   Food Insecurity: Food Insecurity Present (04/12/2023)  Housing: Low Risk  (04/12/2023)  Recent Concern: Housing - High Risk (03/01/2023)  Transportation Needs: No Transportation Needs (04/12/2023)  Recent Concern:  Transportation Needs - Unmet Transportation Needs (03/02/2023)   Received from [facility]  Utilities: Not At Risk (04/12/2023)  Financial Resource Strain: Low Risk  (08/29/2021)  Tobacco Use: Low Risk  (04/10/2023)   SDOH Interventions:     Readmission Risk Interventions     No data to display

## 2023-04-12 NOTE — Progress Notes (Signed)
   Patient nurse reported that patient is complaining about abdominal pain 10 out of 10 intensity and tender to touch.  Also patient has dark-colored urine. - Checking x-ray abdomen. - Obtaining UA. - Per chart review patient has history of atrial flutter on plan to continue Eliquis for now.   Tereasa Coop, MD [facility] 04/12/2023, 8:46 PM

## 2023-04-12 NOTE — Evaluation (Addendum)
Occupational Therapy Evaluation Patient Details Name: Eddie Graham MRN: [id number] DOB: 1935-02-17 Today's Date: 04/12/2023   History of Present Illness 88 y.o. male admitted 04/10/23 with chest pain, SOB; no evidence of acute ischemia. Workup for acute on chronic hypoxic respiratory failure, CHF, bilateral pleural effusions. Plan for TEE/DCCV. PMH includes CAD, COPD, HTN, AAA, CKD, DM, neuropathy, afib/flutter, colostomy, depression, back pain.   Clinical Impression   PTA, pt lives alone, reports typically ambulatory with cane vs no AD and able to manage ADLs/IADLs though increasing difficulty. Pt presents now with deficits in cardiopulmonary endurance, cognition and dynamic standing balance. Overall, pt requires CGA-Min A for mobility using RW as trial for energy conservation. Pt requires Supervision-Min A for ADLs though would benefit from further energy conservation education. Pt reports showering tasks most difficult at home and would benefit from shower chair. Recommend HHOT follow up at [place].   Pt reporting SOB though SpO2 WFL on 6 L O2 with activity. SpO2 92% at rest on 5 L O2. HR 120s w/ activity      If plan is discharge home, recommend the following: A little help with bathing/dressing/bathroom;Assistance with cooking/housework;Direct supervision/assist for medications management    Functional Status Assessment  Patient has had a recent decline in their functional status and demonstrates the ability to make significant improvements in function in a reasonable and predictable amount of time.  Equipment Recommendations  Tub/shower seat    Recommendations for Other Services       Precautions / Restrictions Precautions Precautions: Fall;Other (comment) Precaution Comments: colostomy; watch SpO2 (wears supplemental O2 PRN at home) Restrictions Weight Bearing Restrictions Per Provider Order: No      Mobility Bed Mobility Overal bed mobility: Modified Independent                   Transfers Overall transfer level: Needs assistance Equipment used: Rolling walker (2 wheels) Transfers: Sit to/from Stand Sit to Stand: Supervision, Contact guard assist                  Balance Overall balance assessment: Needs assistance Sitting-balance support: No upper extremity supported, Feet supported Sitting balance-Leahy Scale: Good     Standing balance support: No upper extremity supported, During functional activity, Bilateral upper extremity supported Standing balance-Leahy Scale: Fair                             ADL either performed or assessed with clinical judgement   ADL Overall ADL's : Needs assistance/impaired Eating/Feeding: Independent   Grooming: Supervision/safety;Standing;Wash/dry face;Wash/dry hands   Upper Body Bathing: Supervision/ safety Upper Body Bathing Details (indicate cue type and reason): bathing underarms standing at sink Lower Body Bathing: Supervison/ safety;Sitting/lateral leans;Sit to/from stand   Upper Body Dressing : Supervision/safety   Lower Body Dressing: Minimal assistance   Toilet Transfer: Minimal assistance;Contact guard assist;Ambulation;Rolling walker (2 wheels)   Toileting- Clothing Manipulation and Hygiene: Supervision/safety;Sit to/from stand;Sitting/lateral lean       Functional mobility during ADLs: Contact guard assist;Minimal assistance;Rolling walker (2 wheels) General ADL Comments: reports of breathing difficulties with activity though vitals WFL. Pt reports unsure how much O2 he uses at home - just put it on once he got home. Cues for breathing, pacing and safety. discussed shower chair use at home     Vision Baseline Vision/History: 1 Wears glasses Ability to See in Adequate Light: 0 Adequate Patient Visual Report: No change from baseline Vision Assessment?: Wears glasses for reading  Perception         Praxis         Pertinent Vitals/Pain Pain  Assessment Pain Assessment: Faces Faces Pain Scale: Hurts little more Pain Location: abdomen/torso Pain Descriptors / Indicators: Sore Pain Intervention(s): Monitored during session, Limited activity within patient's tolerance     Extremity/Trunk Assessment Upper Extremity Assessment Upper Extremity Assessment: Overall WFL for tasks assessed;Right hand dominant   Lower Extremity Assessment Lower Extremity Assessment: Defer to PT evaluation   Cervical / Trunk Assessment Cervical / Trunk Assessment: Normal   Communication Communication Communication: Difficulty following commands/understanding;Other (comment) (in person interpreter, Vira present) Following commands: Follows one step commands with increased time;Follows one step commands consistently Cueing Techniques: Verbal cues;Gestural cues   Cognition Arousal: Alert Behavior During Therapy: WFL for tasks assessed/performed, Restless, Impulsive Overall Cognitive Status: No family/caregiver present to determine baseline cognitive functioning                                 General Comments: follows majority of commands. some impulsivity and require education x 2 to not pull off pulse ox d/t need to monitor O2. cues for safey, DME use and problem solving. difficulty understanding which floor/room he was in despite interpreter being present     General Comments  on 5 L O2 on wall on entry. due to tank limitations, used 6 L with activity. 3/4 DOE with SpO2 98% on 6 L O2 with activity, 92% at rest    Exercises     Shoulder Instructions      Home Living Family/patient expects to be discharged to:: Private residence Living Arrangements: Alone Available Help at Discharge: Friend(s);Available PRN/intermittently Type of Home: Apartment Home Access: Level entry     Home Layout: One level     Bathroom Shower/Tub: Chief Strategy Officer: Standard Bathroom Accessibility: Yes   Home Equipment: Clinical biochemist (2 wheels);Cane - single point;Electric scooter;BSC/3in1          Prior Functioning/Environment Prior Level of Function : Independent/Modified Independent             Mobility Comments: intermittent use of SPC. lives alone with friend who checks on him as needed. does not drive ADLs Comments: mod indep with ADLs but reports fatigue with some tasks, including ostomy management. pt unsure of how much O2 he wears at home        OT Problem List: Decreased strength;Decreased activity tolerance;Impaired balance (sitting and/or standing);Decreased cognition;Decreased safety awareness;Decreased knowledge of use of DME or AE;Decreased knowledge of precautions;Cardiopulmonary status limiting activity      OT Treatment/Interventions: Self-care/ADL training;Therapeutic exercise;Energy conservation;DME and/or AE instruction;Therapeutic activities;Patient/family education;Balance training    OT Goals(Current goals can be found in the care plan section) Acute Rehab OT Goals Patient Stated Goal: improve breathing OT Goal Formulation: With patient Time For Goal Achievement: 04/26/23 Potential to Achieve Goals: Good  OT Frequency: Min 1X/week    Co-evaluation              AM-PAC OT "6 Clicks" Daily Activity     Outcome Measure Help from another person eating meals?: None Help from another person taking care of personal grooming?: A Little Help from another person toileting, which includes using toliet, bedpan, or urinal?: A Little Help from another person bathing (including washing, rinsing, drying)?: A Little Help from another person to put on and taking off regular upper body clothing?: A Little Help from another  person to put on and taking off regular lower body clothing?: A Little 6 Click Score: 19   End of Session Equipment Utilized During Treatment: Gait belt;Rolling walker (2 wheels);Oxygen Nurse Communication: Mobility status  Activity Tolerance: Patient tolerated  treatment well Patient left: in bed;with call bell/phone within reach;with bed alarm set  OT Visit Diagnosis: Unsteadiness on feet (R26.81);Other abnormalities of gait and mobility (R26.89);Muscle weakness (generalized) (M62.81)                Time: 7846-9629 OT Time Calculation (min): 39 min Charges:  OT General Charges $OT Visit: 1 Visit OT Evaluation $OT Eval Low Complexity: 1 Low OT Treatments $Self Care/Home Management : 8-22 mins $Therapeutic Activity: 8-22 mins  Bradd Canary, OTR/L Acute Rehab Services Office: [phone]   Lorre Munroe 04/12/2023, 1:02 PM

## 2023-04-12 NOTE — Progress Notes (Signed)
Mobility Specialist Progress Note:    04/12/23 1400  Mobility  Activity Ambulated with assistance to bathroom  Level of Assistance Contact guard assist, steadying assist  Assistive Device Cane  Distance Ambulated (ft) 40 ft (20+20)  Activity Response Tolerated well  Mobility Referral Yes  Mobility visit 1 Mobility  Mobility Specialist Start Time (ACUTE ONLY) 1354  Mobility Specialist Stop Time (ACUTE ONLY) 1410  Mobility Specialist Time Calculation (min) (ACUTE ONLY) 16 min   Pt received in bed, agreeable to mobility. Able to ambulate to BR w/ cane using CG. Void successful. C/o some low back pain at EOS, RN aware. Pt left in bed with call bell and all needs met. Bed alarm on.  Eddie Graham Mobility Specialist Please contact via Special educational needs teacher or Rehab office at [phone]

## 2023-04-12 NOTE — Progress Notes (Signed)
PROGRESS NOTE  Eddie Graham MWN:[id number] DOB: [birth year] DOA: 04/10/2023 PCP: Andreas Blower., MD   LOS: 2 days   Brief Narrative / Interim history: Patient is an 88 year old male with past medical history significant for heart failure with reduced EF (EF of 40 to 45%), s/p CABG, bladder cancer status post TURP, sigmoid colon abscess s/p end colostomy, hypertension, hyperlipidemia, paroxysmal atrial fibrillation on Eliquis, anemia, AAA and chronic kidney disease stage IIIb. Patient was admitted with chest pain and shortness of breath. Troponin on presentation was 25. Cardiology team is directing care. For TEE/DCCV once euvolemic. Plan is to continue rate control and Eliquis for now. Adequate diuresis. Chest pain is considered atypical. Patient is a poor historian   Subjective / 24h Interval events: Has been complaining of pain in the upper abdomen area, reports palpitation and shortness of breath  Assesement and Plan: Principal problem Acute on chronic systolic CHF-repeat 2D echo, limited, done this morning shows an EF of 25-30%.  Prior echo-EF of 40-45%.  BNP was elevated on admission, had bilateral pleural effusions and mild pulmonary edema.  Received IV Lasix, currently net -2.9 L.  Weight is improved from 216 on admission to 207 this morning -Further diuresis per cardiology  Active problems PAF flutter, on Eliquis-with RVR, tachycardic at times.  Looks like plans for TEE and cardioversion on 12/18.  Meanwhile continue anticoagulation with Eliquis, metoprolol  Chest pain-atypical, flat troponins  Acute on chronic hypoxic respiratory failure secondary to bilateral pleural effusions and mild pulmonary edema -At baseline, patient is on 4 L nasal cannula.  Continue supplemental oxygen as indicated, but currently appears at baseline   CKD 3B, at baseline -Stable.  Baseline creatinine 1.6-1.9   Type 2 diabetes with hyperglycemia - Last hemoglobin A1c 7.4 on 01/20/2023. Continue  sliding scale insulin coverage. Heart healthy, carb modified diet.   Coronary artery disease status post CABG -noted, continue home regimen  Hypertension -continue metoprolol  Hyperlipidemia -continue statin   Chronic anxiety/depression- Resume home Cymbalta   BPH - Resume home Flomax   COPD - Resume home bronchodilators. Maintain O2 saturation above 90%   Obesity - BMI 33   Physical debility - PT OT assessment   Infrarenal AAA - Outpatient follow-up   Vague abdominal pain -Normal lactic acid.   History of sigmoid abscess status post end colostomy - Colostomy care    Scheduled Meds:  apixaban  2.5 mg Oral BID   DULoxetine  60 mg Oral Daily   insulin aspart  0-5 Units Subcutaneous QHS   insulin aspart  0-9 Units Subcutaneous TID WC   metoprolol succinate  50 mg Oral BID   montelukast  10 mg Oral QHS   pantoprazole  40 mg Oral BID   rosuvastatin  40 mg Oral QHS   tamsulosin  0.4 mg Oral Daily   umeclidinium-vilanterol  1 puff Inhalation Daily   Continuous Infusions: PRN Meds:.acetaminophen, alum & mag hydroxide-simeth, melatonin, perflutren lipid microspheres (DEFINITY) IV suspension, polyethylene glycol, prochlorperazine, sodium chloride  Current Outpatient Medications  Medication Instructions   acetaminophen (TYLENOL) 650 mg, Every 8 hours PRN   albuterol (VENTOLIN HFA) 108 (90 Base) MCG/ACT inhaler 2 puffs, Every 4 hours PRN   ANORO ELLIPTA 62.5-25 MCG/ACT AEPB 1 puff, Daily   apixaban (ELIQUIS) 2.5 mg, Oral, 2 times daily   Carboxymethylcellulose Sodium (EYE DROPS OP) 2 drops, As needed   DULoxetine (CYMBALTA) 60 mg, Daily   ferrous sulfate 325 mg, Daily with breakfast   furosemide (LASIX) 20 mg, Oral,  Daily   gabapentin (NEURONTIN) 100-300 mg, At bedtime PRN   Insulin Pen Needle (TECHLITE PEN NEEDLES) 32G X 4 MM MISC Use to inject Lantus at bedtime as directed   Jardiance 10 mg, Oral, Daily before breakfast   Lantus SoloStar 15 Units, Subcutaneous, Daily at  bedtime   Melatonin 5 mg, At bedtime PRN   metoprolol succinate (TOPROL-XL) 100 mg, Oral, Daily, Take with or immediately following a meal.   montelukast (SINGULAIR) 10 mg, Daily at bedtime   pantoprazole (PROTONIX) 40 MG tablet Take 1 tablet by mouth 2 times daily.   polyethylene glycol (MIRALAX / GLYCOLAX) 17 g, Daily PRN   rosuvastatin (CRESTOR) 40 mg, Nightly   Semaglutide 14 mg, Daily   traMADol (ULTRAM) 50 mg, Oral, Every 6 hours PRN   traZODone (DESYREL) 50 mg, Daily at bedtime    Diet Orders (From admission, onward)     Start     Ordered   04/10/23 2021  Diet heart healthy/carb modified Room service appropriate? Yes; Fluid consistency: Thin  Diet effective now       Question Answer Comment  Diet-HS Snack? Nothing   Room service appropriate? Yes   Fluid consistency: Thin      04/10/23 2020            DVT prophylaxis: apixaban (ELIQUIS) tablet 2.5 mg Start: 04/10/23 2200 apixaban (ELIQUIS) tablet 2.5 mg   Lab Results  Component Value Date   PLT 216 04/12/2023      Code Status: Full Code  Family Communication: no family at bedside   Status is: Inpatient Remains inpatient appropriate because: severity of ilness  Level of care: Telemetry Cardiac  Consultants:  Cardiology   Objective: Vitals:   04/11/23 1930 04/12/23 0008 04/12/23 0355 04/12/23 0721  BP: (!) 108/54 124/89 126/78 (!) 148/90  Pulse: (!) 124 (!) 128 (!) 124 (!) 120  Resp: 20 (!) 24 14 14   Temp: 97.9 F (36.6 C) 98.2 F (36.8 C) 97.9 F (36.6 C) 97.9 F (36.6 C)  TempSrc: Oral Oral Oral Oral  SpO2: 91% 98% 93% 96%  Weight:   94.3 kg   Height:        Intake/Output Summary (Last 24 hours) at 04/12/2023 0953 Last data filed at 04/12/2023 0704 Gross per 24 hour  Intake 240 ml  Output 2800 ml  Net -2560 ml   Wt Readings from Last 3 Encounters:  04/12/23 94.3 kg  03/02/23 96.6 kg  03/01/23 96.2 kg    Examination:  Constitutional: NAD Eyes: no scleral icterus ENMT: Mucous  membranes are moist.  Neck: normal, supple Respiratory: Distant breath sounds, no wheezing.  Cardiovascular: Regular rate and rhythm, no murmurs / rubs / gallops. No LE edema.  Abdomen: non distended, no tenderness. Bowel sounds positive.  Musculoskeletal: no clubbing / cyanosis.   Data Reviewed: I have independently reviewed following labs and imaging studies   CBC Recent Labs  Lab 04/10/23 1326 04/11/23 0506 04/12/23 0853  WBC 8.8 7.6 8.5  HGB 14.7 14.3 15.0  HCT 47.2 44.8 45.8  PLT 241 208 216  MCV 90.1 89.6 88.4  MCH 28.1 28.6 29.0  MCHC 31.1 31.9 32.8  RDW 15.3 15.2 15.2    Recent Labs  Lab 04/10/23 1326 04/10/23 1338 04/11/23 0506 04/12/23 0853  NA 139  --  139 137  K 3.9  --  3.9 3.6  CL 106  --  104 99  CO2 23  --  25 26  GLUCOSE 143*  --  138* 147*  BUN 19  --  22 28*  CREATININE 1.74*  --  1.65* 1.73*  CALCIUM 8.7*  --  8.6* 8.6*  AST 19  --   --   --   ALT 18  --   --   --   ALKPHOS 80  --   --   --   BILITOT 1.8*  --   --   --   ALBUMIN 3.1*  --   --   --   MG  --   --  2.0 2.1  LATICACIDVEN  --  1.7  --   --   BNP 602.3*  --   --   --     ------------------------------------------------------------------------------------------------------------------ No results for input(s): "CHOL", "HDL", "LDLCALC", "TRIG", "CHOLHDL", "LDLDIRECT" in the last 72 hours.  Lab Results  Component Value Date   HGBA1C 7.4 (H) 01/20/2023   ------------------------------------------------------------------------------------------------------------------ No results for input(s): "TSH", "T4TOTAL", "T3FREE", "THYROIDAB" in the last 72 hours.  Invalid input(s): "FREET3"  Cardiac Enzymes No results for input(s): "CKMB", "TROPONINI", "MYOGLOBIN" in the last 168 hours.  Invalid input(s): "CK" ------------------------------------------------------------------------------------------------------------------    Component Value Date/Time   BNP 602.3 (H) 04/10/2023 1326     CBG: Recent Labs  Lab 04/11/23 0738 04/11/23 1214 04/11/23 1702 04/11/23 2106 04/12/23 0719  GLUCAP 135* 176* 106* 190* 158*    Recent Results (from the past 240 hours)  Resp panel by RT-PCR (RSV, Flu A&B, Covid) Anterior Nasal Swab     Status: None   Collection Time: 04/10/23  1:26 PM   Specimen: Anterior Nasal Swab  Result Value Ref Range Status   SARS Coronavirus 2 by RT PCR NEGATIVE NEGATIVE Final   Influenza A by PCR NEGATIVE NEGATIVE Final   Influenza B by PCR NEGATIVE NEGATIVE Final    Comment: (NOTE) The Xpert Xpress SARS-CoV-2/FLU/RSV plus assay is intended as an aid in the diagnosis of influenza from Nasopharyngeal swab specimens and should not be used as a sole basis for treatment. Nasal washings and aspirates are unacceptable for Xpert Xpress SARS-CoV-2/FLU/RSV testing.  Fact Sheet for Patients: BloggerCourse.com  Fact Sheet for Healthcare Providers: SeriousBroker.it  This test is not yet approved or cleared by the [place] FDA and has been authorized for detection and/or diagnosis of SARS-CoV-2 by FDA under an Emergency Use Authorization (EUA). This EUA will remain in effect (meaning this test can be used) for the duration of the COVID-19 declaration under Section 564(b)(1) of the Act, 21 U.S.C. section 360bbb-3(b)(1), unless the authorization is terminated or revoked.     Resp Syncytial Virus by PCR NEGATIVE NEGATIVE Final    Comment: (NOTE) Fact Sheet for Patients: BloggerCourse.com  Fact Sheet for Healthcare Providers: SeriousBroker.it  This test is not yet approved or cleared by the [place] FDA and has been authorized for detection and/or diagnosis of SARS-CoV-2 by FDA under an Emergency Use Authorization (EUA). This EUA will remain in effect (meaning this test can be used) for the duration of the COVID-19 declaration under Section  564(b)(1) of the Act, 21 U.S.C. section 360bbb-3(b)(1), unless the authorization is terminated or revoked.  Performed at [facility], [address], [place], [place] [address]   Blood culture (routine x 2)     Status: None (Preliminary result)   Collection Time: 04/10/23  1:29 PM   Specimen: BLOOD  Result Value Ref Range Status   Specimen Description BLOOD RIGHT ANTECUBITAL  Final   Special Requests   Final  BOTTLES DRAWN AEROBIC AND ANAEROBIC Blood Culture results may not be optimal due to an inadequate volume of blood received in culture bottles   Culture   Final    NO GROWTH 2 DAYS Performed at [facility], [address] [place], [place] [address]    Report Status PENDING  Incomplete  Blood culture (routine x 2)     Status: None (Preliminary result)   Collection Time: 04/10/23  1:34 PM   Specimen: BLOOD LEFT WRIST  Result Value Ref Range Status   Specimen Description BLOOD LEFT WRIST  Final   Special Requests   Final    BOTTLES DRAWN AEROBIC AND ANAEROBIC Blood Culture adequate volume   Culture   Final    NO GROWTH 2 DAYS Performed at [facility], [address] [place], [place] [address]    Report Status PENDING  Incomplete     Radiology Studies: ECHOCARDIOGRAM LIMITED Result Date: 04/12/2023    ECHOCARDIOGRAM LIMITED REPORT   Patient Name:   Eddie Graham Date of Exam: 04/12/2023 Medical Rec #:  [id number]                 Height:       66.0 in Accession #:    [id number]                Weight:       207.9 lb Date of Birth:  14-Jun-1934                 BSA:          2.033 m Patient Age:    88 years                  BP:           148/90 mmHg Patient Gender: M                         HR:           145 bpm. Exam Location:  Inpatient Procedure: Limited Echo and Intracardiac Opacification Agent Indications:    CHF-Acute Systolic I50.21  History:        Patient has prior history of Echocardiogram examinations, most                 recent  01/27/2023. Prior CABG, COPD; Risk Factors:Hypertension                 and Diabetes.  Sonographer:    Darlys Gales Referring Phys: [id number] CAROLE N HALL IMPRESSIONS  1. Left ventricular ejection fraction, by estimation, is 25 to 30%. The left ventricle has severely decreased function. The left ventricle demonstrates global hypokinesis.  2. Right ventricular systolic function is moderately reduced. The right ventricular size is normal.  3. Left atrial size was mildly dilated.  4. Right atrial size was mildly dilated.  5. The mitral valve is normal in structure. Color Doppler not performed  6. The aortic valve is tricuspid. There is mild calcification of the aortic valve. Color Doppler not performed  7. The inferior vena cava is dilated in size with <50% respiratory variability, suggesting right atrial pressure of 15 mmHg. FINDINGS  Left Ventricle: Left ventricular ejection fraction, by estimation, is 25 to 30%. The left ventricle has severely decreased function. The left ventricle demonstrates global hypokinesis. The left ventricular internal cavity size was normal in size. There is no left ventricular hypertrophy. Right Ventricle: The right ventricular  size is normal. No increase in right ventricular wall thickness. Right ventricular systolic function is moderately reduced. Left Atrium: Left atrial size was mildly dilated. Right Atrium: Right atrial size was mildly dilated. Pericardium: There is no evidence of pericardial effusion. Mitral Valve: The mitral valve was not well visualized. Tricuspid Valve: The tricuspid valve is normal in structure. Tricuspid valve regurgitation is not demonstrated. No evidence of tricuspid stenosis. Aortic Valve: The aortic valve is tricuspid. There is mild calcification of the aortic valve. Pulmonic Valve: The pulmonic valve was not well visualized. Pulmonic valve regurgitation is not visualized. No evidence of pulmonic stenosis. Aorta: Aortic root could not be assessed. Venous: The  inferior vena cava is dilated in size with less than 50% respiratory variability, suggesting right atrial pressure of 15 mmHg. IAS/Shunts: No atrial level shunt detected by color flow Doppler. LEFT VENTRICLE PLAX 2D LVIDd:         4.50 cm LVIDs:         4.00 cm LV PW:         0.90 cm LV IVS:        1.00 cm LVOT diam:     2.00 cm LVOT Area:     3.14 cm  IVC IVC diam: 2.30 cm LEFT ATRIUM             Index        RIGHT ATRIUM           Index LA Vol (A2C):   68.9 ml 33.89 ml/m  RA Area:     20.50 cm LA Vol (A4C):   36.0 ml 17.71 ml/m  RA Volume:   58.50 ml  28.78 ml/m LA Biplane Vol: 51.0 ml 25.09 ml/m   SHUNTS Systemic Diam: 2.00 cm Arvilla Meres MD Electronically signed by Arvilla Meres MD Signature Date/Time: 04/12/2023/9:22:03 AM    Final      Pamella Pert, MD, PhD [facility]  Between 7 am - 7 pm I am available, please contact me via Amion (for emergencies) or Securechat (non urgent messages)  Between 7 pm - 7 am I am not available, please contact night coverage MD/APP via Amion

## 2023-04-12 NOTE — Telephone Encounter (Signed)
Attempted to call patient regarding upcoming cardiac PET appointment. Left message on voicemail with name and callback number Rockwell Alexandria RN Navigator Cardiac Imaging [facility] [phone] Office [phone] Cell  Christian interpreter [phone]

## 2023-04-12 NOTE — Progress Notes (Addendum)
Patient Name: Eddie Graham Date of Encounter: 04/12/2023 [facility] Cardiologist: Rollene Rotunda, MD   Interval Summary  .    Patient seen this morning with translation assistance from language line, translator [phone]. Patient seems confused and provided limited answers to questions. He seems to be reporting some sensation of throat tightness as well as abdominal pain this morning.   Vital Signs .    Vitals:   04/11/23 1930 04/12/23 0008 04/12/23 0355 04/12/23 0721  BP: (!) 108/54 124/89 126/78 (!) 148/90  Pulse: (!) 124 (!) 128 (!) 124 (!) 120  Resp: 20 (!) 24 14 14   Temp: 97.9 F (36.6 C) 98.2 F (36.8 C) 97.9 F (36.6 C) 97.9 F (36.6 C)  TempSrc: Oral Oral Oral Oral  SpO2: 91% 98% 93% 96%  Weight:   94.3 kg   Height:        Intake/Output Summary (Last 24 hours) at 04/12/2023 0817 Last data filed at 04/12/2023 0704 Gross per 24 hour  Intake 480 ml  Output 2800 ml  Net -2320 ml      04/12/2023    3:55 AM 04/11/2023    3:06 AM 04/10/2023    8:57 PM  Last 3 Weights  Weight (lbs) 207 lb 14.3 oz 214 lb 11.2 oz 214 lb 11.2 oz  Weight (kg) 94.3 kg 97.387 kg 97.387 kg      Telemetry/ECG    Persistent atrial flutter. Ventricular rate ~124bpm. - Personally Reviewed  Physical Exam .   GEN: No acute distress.   Neck: Mild elevation of JVP Cardiac: RRR, no murmurs, rubs, or gallops.  Respiratory: Clear to auscultation bilaterally. GI: Soft, nontender, non-distended  MS: No edema  Assessment & Plan .     Eddie Graham is a 88 y.o. male with a hx of chronic heart failure with reduced ejection fraction (EF 40-45% on 01/27/2023), coronary artery disease s/p CABG in 2011, paroxysmal atrial fibrillation/atrial flutter, AAA, sigmoid colon abscess s/p end colostomy, and chronic kidney disease IIIb who is being seen for the evaluation of chest pain.  CAD s/p CABG Chest pain  Patient presented to the ED with acute on chronic chest  pain. EKG with chronic RBBB, LAFB, and no acute ischemic changes. Troponin 25->25.  Chest pain atypical and not consistent with ACS.  No ASA with Eliquis for AF/AFL Continue Rosuvastatin 40mg   Paroxysmal atrial fibrillation/flutter  Telemetry this admission reflects atrial flutter. Patient remains in flutter this morning with very stable ventricular rates 123-124bpm.   Increase Toprol XL to home dose, 50mg  BID Continue Eliquis Plan for TEE/DCCV following additional volume reduction  Acute on chronic HFrEF Acute on chronic hypoxic respiratory failure  Patient with NYHA class III symptoms with bilateral pleural effusions and mild pulmonary edema on imaging. BNP 602.3 this admission. Last echo from October 2024 showed LVEF 40-45% with global hypokinesis.   Patient net negative 2.9L. Creatinine has not been checked this morning. Lasix per BMP this morning as patient without significant JVP or LE edema.  Repeat limited echocardiogram pending Increase Toprol XL to home dose, 50mg  BID Plan to resume SGLT2 after TEE/DCCV Consider MRA initation with pending BMP today BP fluctuant this admission, but could also consider low dose ARB  CKD IIIb  Creatinine stable so far this admission. BMP pending this morning.   DM type II  Per primary team  For questions or updates, please contact [facility] Please consult [email or web address] for contact info under        Signed,  Perlie Gold, PA-C    ATTENDING ATTESTATION:  After conducting a review of all available clinical information with the care team, interviewing the patient, and performing a physical exam, I agree with the findings and plan described in this note.   GEN: No acute distress.   HEENT:  MMM, no JVD, no scleral icterus Cardiac: Tachycardic, no murmurs, rubs, or gallops.  Respiratory: Clear to auscultation bilaterally. GI: Soft, nontender, non-distended  MS: No edema; No deformity. Neuro:  Nonfocal  Vasc:  +2  radial pulses  Interview conducted with interpreter over the phone.  Patient remains mildly dyspneic.  No other acute issues.  NPO for TEE CV today due to atrial flutter.  Will optimize regimen following restoration of NSR>  Alverda Skeans, MD Pager [phone]

## 2023-04-12 NOTE — Progress Notes (Signed)
   04/10/23 2322  Assess: MEWS Score  Temp 97.7 F (36.5 C)  BP 128/87  MAP (mmHg) 100  Pulse Rate (!) 124  ECG Heart Rate (!) 126  Resp 16  SpO2 95 %  O2 Device Nasal Cannula  Assess: MEWS Score  MEWS Temp 0  MEWS Systolic 0  MEWS Pulse 2  MEWS RR 0  MEWS LOC 0  MEWS Score 2  MEWS Score Color Yellow  Assess: if the MEWS score is Yellow or Red  Were vital signs accurate and taken at a resting state? Yes  Does the patient meet 2 or more of the SIRS criteria? No  MEWS guidelines implemented  Yes, yellow  Treat  MEWS Interventions Considered administering scheduled or prn medications/treatments as ordered  Take Vital Signs  Increase Vital Sign Frequency  Yellow: Q2hr x1, continue Q4hrs until patient remains green for 12hrs  Escalate  MEWS: Escalate Yellow: Discuss with charge nurse and consider notifying provider and/or RRT  Notify: Charge Nurse/RN  Name of Charge Nurse/RN Notified Earleen Newport RN  Assess: SIRS CRITERIA  SIRS Temperature  0  SIRS Respirations  0  SIRS Pulse 1  SIRS WBC 0  SIRS Score Sum  1

## 2023-04-13 ENCOUNTER — Encounter (HOSPITAL_COMMUNITY): Payer: 59

## 2023-04-13 DIAGNOSIS — I483 Typical atrial flutter: Secondary | ICD-10-CM

## 2023-04-13 DIAGNOSIS — R079 Chest pain, unspecified: Secondary | ICD-10-CM | POA: Diagnosis not present

## 2023-04-13 LAB — CBC
HCT: 47.6 % (ref 39.0–52.0)
Hemoglobin: 15.1 g/dL (ref 13.0–17.0)
MCH: 28.3 pg (ref 26.0–34.0)
MCHC: 31.7 g/dL (ref 30.0–36.0)
MCV: 89.1 fL (ref 80.0–100.0)
Platelets: 196 10*3/uL (ref 150–400)
RBC: 5.34 MIL/uL (ref 4.22–5.81)
RDW: 15.1 % (ref 11.5–15.5)
WBC: 8.1 10*3/uL (ref 4.0–10.5)
nRBC: 0 % (ref 0.0–0.2)

## 2023-04-13 LAB — LACTOFERRIN, FECAL, QUALITATIVE: Lactoferrin, Fecal, Qual: NEGATIVE

## 2023-04-13 LAB — URINALYSIS, ROUTINE W REFLEX MICROSCOPIC
Bilirubin Urine: NEGATIVE
Glucose, UA: 50 mg/dL — AB
Ketones, ur: NEGATIVE mg/dL
Nitrite: NEGATIVE
Protein, ur: 30 mg/dL — AB
RBC / HPF: >50 RBC/hpf (ref 0–5)
Specific Gravity, Urine: 1.01 (ref 1.005–1.030)
WBC, UA: >50 WBC/hpf (ref 0–5)
pH: 6 (ref 5.0–8.0)

## 2023-04-13 LAB — COMPREHENSIVE METABOLIC PANEL
ALT: 17 U/L (ref 0–44)
AST: 18 U/L (ref 15–41)
Albumin: 2.9 g/dL — ABNORMAL LOW (ref 3.5–5.0)
Alkaline Phosphatase: 67 U/L (ref 38–126)
Anion gap: 11 (ref 5–15)
BUN: 31 mg/dL — ABNORMAL HIGH (ref 8–23)
CO2: 28 mmol/L (ref 22–32)
Calcium: 8.4 mg/dL — ABNORMAL LOW (ref 8.9–10.3)
Chloride: 98 mmol/L (ref 98–111)
Creatinine, Ser: 1.73 mg/dL — ABNORMAL HIGH (ref 0.61–1.24)
GFR, Estimated: 38 mL/min — ABNORMAL LOW (ref >60)
Glucose, Bld: 139 mg/dL — ABNORMAL HIGH (ref 70–99)
Potassium: 3.6 mmol/L (ref 3.5–5.1)
Sodium: 137 mmol/L (ref 135–145)
Total Bilirubin: 1.5 mg/dL — ABNORMAL HIGH (ref <1.2)
Total Protein: 5.6 g/dL — ABNORMAL LOW (ref 6.5–8.1)

## 2023-04-13 LAB — GLUCOSE, CAPILLARY
Glucose-Capillary: 112 mg/dL — ABNORMAL HIGH (ref 70–99)
Glucose-Capillary: 185 mg/dL — ABNORMAL HIGH (ref 70–99)
Glucose-Capillary: 233 mg/dL — ABNORMAL HIGH (ref 70–99)
Glucose-Capillary: 165 mg/dL — ABNORMAL HIGH (ref 70–99)

## 2023-04-13 LAB — MAGNESIUM: Magnesium: 2.5 mg/dL — ABNORMAL HIGH (ref 1.7–2.4)

## 2023-04-13 MED ORDER — POTASSIUM CHLORIDE CRYS ER 20 MEQ PO TBCR
20.0000 meq | EXTENDED_RELEASE_TABLET | Freq: Once | ORAL | Status: AC
  Administered 2023-04-13: 20 meq via ORAL
  Filled 2023-04-13: qty 1

## 2023-04-13 MED ORDER — GLYCERIN (LAXATIVE) 2 G RE SUPP
1.0000 | Freq: Once | RECTAL | Status: DC
  Filled 2023-04-13: qty 1

## 2023-04-13 MED ORDER — FUROSEMIDE 20 MG PO TABS
20.0000 mg | ORAL_TABLET | Freq: Every day | ORAL | Status: DC
  Administered 2023-04-13 – 2023-04-17 (×5): 20 mg via ORAL
  Filled 2023-04-13 (×5): qty 1

## 2023-04-13 MED ORDER — MENTHOL 3 MG MT LOZG
1.0000 | LOZENGE | OROMUCOSAL | Status: DC | PRN
  Administered 2023-04-13 – 2023-04-16 (×3): 3 mg via ORAL
  Filled 2023-04-13: qty 9

## 2023-04-13 MED ORDER — POLYETHYLENE GLYCOL 3350 17 G PO PACK
17.0000 g | PACK | Freq: Two times a day (BID) | ORAL | Status: DC
Start: 2023-04-13 — End: 2023-04-17
  Administered 2023-04-13 – 2023-04-17 (×7): 17 g via ORAL
  Filled 2023-04-13 (×8): qty 1

## 2023-04-13 MED ORDER — SENNOSIDES-DOCUSATE SODIUM 8.6-50 MG PO TABS
2.0000 | ORAL_TABLET | Freq: Two times a day (BID) | ORAL | Status: DC
  Administered 2023-04-13 – 2023-04-17 (×9): 2 via ORAL
  Filled 2023-04-13 (×9): qty 2

## 2023-04-13 NOTE — Progress Notes (Signed)
PROGRESS NOTE  Eddie Graham YNW:[id number] DOB: 10/20/1934 DOA: 04/10/2023 PCP: Andreas Blower., MD   LOS: 3 days   Brief Narrative / Interim history: Patient is an 88 year old male with past medical history significant for heart failure with reduced EF (EF of 40 to 45%), s/p CABG, bladder cancer status post TURP, sigmoid colon abscess s/p end colostomy, hypertension, hyperlipidemia, paroxysmal atrial fibrillation on Eliquis, anemia, AAA and chronic kidney disease stage IIIb. Patient was admitted with chest pain and shortness of breath. Troponin on presentation was 25. Cardiology team is directing care. For TEE/DCCV once euvolemic. Plan is to continue rate control and Eliquis for now. Adequate diuresis. Chest pain is considered atypical. Patient is a poor historian   Subjective / 24h Interval events: Complains of abdominal pain as well as cough and sensation of globus  Assesement and Plan: Principal problem Acute on chronic systolic CHF-repeat 2D echo shows an EF of 25-30%.  Prior echo-EF of 40-45%.  BNP was elevated on admission, had bilateral pleural effusions and mild pulmonary edema.  Received IV Lasix, currently net -2.9 L.  Weight is improved from 216 on admission to 207 this morning -For the diuresis per cardiology  Active problems PAF flutter, on Eliquis-with RVR, tachycardic at times.  Looks like plans for TEE and cardioversion tomorrow.  Meanwhile continue anticoagulation with Eliquis, metoprolol.  N.p.o. past midnight  Chest pain-atypical, flat troponins  Acute on chronic hypoxic respiratory failure secondary to bilateral pleural effusions and mild pulmonary edema -At baseline, patient is on 4 L nasal cannula.  Continue supplemental oxygen as indicated, but currently appears at baseline   CKD 3B, at baseline -Stable.  Baseline creatinine 1.6-1.9   Type 2 diabetes with hyperglycemia - Last hemoglobin A1c 7.4 on 01/20/2023. Continue sliding scale insulin coverage.  Heart healthy, carb modified diet.   Coronary artery disease status post CABG -noted, continue home regimen  Hypertension -continue metoprolol  Hyperlipidemia -continue statin   Chronic anxiety/depression- Resume home Cymbalta   BPH - Resume home Flomax   COPD - Resume home bronchodilators. Maintain O2 saturation above 90%   Obesity - BMI 33   Physical debility - PT OT assessment   Infrarenal AAA - Outpatient follow-up   Vague abdominal pain -worsening overnight, x-ray with large amount of stool throughout the colon.  Increase bowel regimen.  He has no vomiting, no nausea and able to eat well.   History of sigmoid abscess status post end colostomy - Colostomy care    Scheduled Meds:  apixaban  2.5 mg Oral BID   DULoxetine  60 mg Oral Daily   furosemide  20 mg Oral Daily   Glycerin (Adult)  1 suppository Rectal Once   insulin aspart  0-5 Units Subcutaneous QHS   insulin aspart  0-9 Units Subcutaneous TID WC   metoprolol succinate  50 mg Oral BID   montelukast  10 mg Oral QHS   pantoprazole  40 mg Oral BID   pneumococcal 20-valent conjugate vaccine  0.5 mL Intramuscular Tomorrow-1000   polyethylene glycol  17 g Oral BID   rosuvastatin  40 mg Oral QHS   senna-docusate  2 tablet Oral BID   tamsulosin  0.4 mg Oral Daily   traZODone  50 mg Oral QHS   umeclidinium-vilanterol  1 puff Inhalation Daily   Continuous Infusions: PRN Meds:.acetaminophen, albuterol, alum & mag hydroxide-simeth, gabapentin, melatonin, menthol-cetylpyridinium, prochlorperazine, sodium chloride  Current Outpatient Medications  Medication Instructions   acetaminophen (TYLENOL) 650 mg, Every 8 hours PRN  albuterol (VENTOLIN HFA) 108 (90 Base) MCG/ACT inhaler 2 puffs, Every 4 hours PRN   ANORO ELLIPTA 62.5-25 MCG/ACT AEPB 1 puff, Daily   apixaban (ELIQUIS) 2.5 mg, Oral, 2 times daily   Carboxymethylcellulose Sodium (EYE DROPS OP) 2 drops, As needed   DULoxetine (CYMBALTA) 60 mg, Daily   ferrous  sulfate 325 mg, Daily with breakfast   furosemide (LASIX) 20 mg, Oral, Daily   gabapentin (NEURONTIN) 100-300 mg, At bedtime PRN   Insulin Pen Needle (TECHLITE PEN NEEDLES) 32G X 4 MM MISC Use to inject Lantus at bedtime as directed   Jardiance 10 mg, Oral, Daily before breakfast   Lantus SoloStar 15 Units, Subcutaneous, Daily at bedtime   Melatonin 5 mg, At bedtime PRN   metoprolol succinate (TOPROL-XL) 100 mg, Oral, Daily, Take with or immediately following a meal.   montelukast (SINGULAIR) 10 mg, Daily at bedtime   pantoprazole (PROTONIX) 40 MG tablet Take 1 tablet by mouth 2 times daily.   polyethylene glycol (MIRALAX / GLYCOLAX) 17 g, Daily PRN   rosuvastatin (CRESTOR) 40 mg, Nightly   Semaglutide 14 mg, Daily   traMADol (ULTRAM) 50 mg, Oral, Every 6 hours PRN   traZODone (DESYREL) 50 mg, Daily at bedtime    Diet Orders (From admission, onward)     Start     Ordered   04/10/23 2021  Diet heart healthy/carb modified Room service appropriate? Yes; Fluid consistency: Thin  Diet effective now       Question Answer Comment  Diet-HS Snack? Nothing   Room service appropriate? Yes   Fluid consistency: Thin      04/10/23 2020            DVT prophylaxis: apixaban (ELIQUIS) tablet 2.5 mg Start: 04/10/23 2200 apixaban (ELIQUIS) tablet 2.5 mg   Lab Results  Component Value Date   PLT 196 04/13/2023      Code Status: Full Code  Family Communication: no family at bedside   Status is: Inpatient Remains inpatient appropriate because: severity of ilness  Level of care: Telemetry Cardiac  Consultants:  Cardiology   Objective: Vitals:   04/13/23 0004 04/13/23 0318 04/13/23 0803 04/13/23 0804  BP: 117/85 106/66 105/77 105/77  Pulse: (!) 110 (!) 109 (!) 115   Resp: 13 16 16 20   Temp: 98.1 F (36.7 C) 97.6 F (36.4 C) 97.6 F (36.4 C)   TempSrc: Oral Axillary Axillary   SpO2: 95% 97% 94% 95%  Weight:  94.2 kg    Height:        Intake/Output Summary (Last 24 hours)  at 04/13/2023 1045 Last data filed at 04/13/2023 0600 Gross per 24 hour  Intake 844 ml  Output 900 ml  Net -56 ml   Wt Readings from Last 3 Encounters:  04/13/23 94.2 kg  03/02/23 96.6 kg  03/01/23 96.2 kg    Examination:  Constitutional: NAD Eyes: lids and conjunctivae normal, no scleral icterus ENMT: mmm Neck: normal, supple Respiratory: clear to auscultation bilaterally, no wheezing, no crackles. Normal respiratory effort.  Cardiovascular: Regular rate and rhythm, no murmurs / rubs / gallops. No LE edema.  Tachycardic Abdomen: soft, no distention, no tenderness. Bowel sounds positive.    Data Reviewed: I have independently reviewed following labs and imaging studies   CBC Recent Labs  Lab 04/10/23 1326 04/11/23 0506 04/12/23 0853 04/13/23 0344  WBC 8.8 7.6 8.5 8.1  HGB 14.7 14.3 15.0 15.1  HCT 47.2 44.8 45.8 47.6  PLT 241 208 216 196  MCV 90.1  89.6 88.4 89.1  MCH 28.1 28.6 29.0 28.3  MCHC 31.1 31.9 32.8 31.7  RDW 15.3 15.2 15.2 15.1    Recent Labs  Lab 04/10/23 1326 04/10/23 1338 04/11/23 0506 04/12/23 0853 04/13/23 0344  NA 139  --  139 137 137  K 3.9  --  3.9 3.6 3.6  CL 106  --  104 99 98  CO2 23  --  25 26 28   GLUCOSE 143*  --  138* 147* 139*  BUN 19  --  22 28* 31*  CREATININE 1.74*  --  1.65* 1.73* 1.73*  CALCIUM 8.7*  --  8.6* 8.6* 8.4*  AST 19  --   --   --  18  ALT 18  --   --   --  17  ALKPHOS 80  --   --   --  67  BILITOT 1.8*  --   --   --  1.5*  ALBUMIN 3.1*  --   --   --  2.9*  MG  --   --  2.0 2.1 2.5*  LATICACIDVEN  --  1.7  --   --   --   BNP 602.3*  --   --   --   --     ------------------------------------------------------------------------------------------------------------------ No results for input(s): "CHOL", "HDL", "LDLCALC", "TRIG", "CHOLHDL", "LDLDIRECT" in the last 72 hours.  Lab Results  Component Value Date   HGBA1C 7.4 (H) 01/20/2023    ------------------------------------------------------------------------------------------------------------------ No results for input(s): "TSH", "T4TOTAL", "T3FREE", "THYROIDAB" in the last 72 hours.  Invalid input(s): "FREET3"  Cardiac Enzymes No results for input(s): "CKMB", "TROPONINI", "MYOGLOBIN" in the last 168 hours.  Invalid input(s): "CK" ------------------------------------------------------------------------------------------------------------------    Component Value Date/Time   BNP 602.3 (H) 04/10/2023 1326    CBG: Recent Labs  Lab 04/12/23 1103 04/12/23 1602 04/12/23 1620 04/12/23 2134 04/13/23 0720  GLUCAP 203* 190* 184* 154* 112*    Recent Results (from the past 240 hours)  Resp panel by RT-PCR (RSV, Flu A&B, Covid) Anterior Nasal Swab     Status: None   Collection Time: 04/10/23  1:26 PM   Specimen: Anterior Nasal Swab  Result Value Ref Range Status   SARS Coronavirus 2 by RT PCR NEGATIVE NEGATIVE Final   Influenza A by PCR NEGATIVE NEGATIVE Final   Influenza B by PCR NEGATIVE NEGATIVE Final    Comment: (NOTE) The Xpert Xpress SARS-CoV-2/FLU/RSV plus assay is intended as an aid in the diagnosis of influenza from Nasopharyngeal swab specimens and should not be used as a sole basis for treatment. Nasal washings and aspirates are unacceptable for Xpert Xpress SARS-CoV-2/FLU/RSV testing.  Fact Sheet for Patients: BloggerCourse.com  Fact Sheet for Healthcare Providers: SeriousBroker.it  This test is not yet approved or cleared by the [place] FDA and has been authorized for detection and/or diagnosis of SARS-CoV-2 by FDA under an Emergency Use Authorization (EUA). This EUA will remain in effect (meaning this test can be used) for the duration of the COVID-19 declaration under Section 564(b)(1) of the Act, 21 U.S.C. section 360bbb-3(b)(1), unless the authorization is terminated or revoked.      Resp Syncytial Virus by PCR NEGATIVE NEGATIVE Final    Comment: (NOTE) Fact Sheet for Patients: BloggerCourse.com  Fact Sheet for Healthcare Providers: SeriousBroker.it  This test is not yet approved or cleared by the [place] FDA and has been authorized for detection and/or diagnosis of SARS-CoV-2 by FDA under an Emergency Use Authorization (EUA). This EUA will remain in effect (  meaning this test can be used) for the duration of the COVID-19 declaration under Section 564(b)(1) of the Act, 21 U.S.C. section 360bbb-3(b)(1), unless the authorization is terminated or revoked.  Performed at [facility], [address], [place], [place] [address]   Blood culture (routine x 2)     Status: None (Preliminary result)   Collection Time: 04/10/23  1:29 PM   Specimen: BLOOD  Result Value Ref Range Status   Specimen Description BLOOD RIGHT ANTECUBITAL  Final   Special Requests   Final    BOTTLES DRAWN AEROBIC AND ANAEROBIC Blood Culture results may not be optimal due to an inadequate volume of blood received in culture bottles   Culture   Final    NO GROWTH 3 DAYS Performed at [facility], [address] [place], [place] [address]    Report Status PENDING  Incomplete  Blood culture (routine x 2)     Status: None (Preliminary result)   Collection Time: 04/10/23  1:34 PM   Specimen: BLOOD LEFT WRIST  Result Value Ref Range Status   Specimen Description BLOOD LEFT WRIST  Final   Special Requests   Final    BOTTLES DRAWN AEROBIC AND ANAEROBIC Blood Culture adequate volume   Culture   Final    NO GROWTH 3 DAYS Performed at [facility], [address] [place], [place] [address]    Report Status PENDING  Incomplete     Radiology Studies: DG Abd Portable 1V Result Date: 04/12/2023 CLINICAL DATA:  Abdominal pain EXAM: PORTABLE ABDOMEN - 1 VIEW COMPARISON:  CT chest abdomen and pelvis 04/10/2023. FINDINGS: The  bowel gas pattern is normal. There is a large amount of stool throughout the colon. There are atherosclerotic calcifications of the aorta. Punctate densities in the left abdomen are favored as bowel content. IMPRESSION: 1. Large amount of stool throughout the colon. 2. No bowel obstruction. Electronically Signed   By: Darliss Cheney M.D.   On: 04/12/2023 23:03     Pamella Pert, MD, PhD [facility]  Between 7 am - 7 pm I am available, please contact me via Amion (for emergencies) or Securechat (non urgent messages)  Between 7 pm - 7 am I am not available, please contact night coverage MD/APP via Amion

## 2023-04-13 NOTE — Progress Notes (Addendum)
Patient Name: Eddie Graham Date of Encounter: 04/13/2023 [facility] Cardiologist: Rollene Rotunda, MD   Interval Summary  .    Breathing is about the same. Remains on 4L [place] at baseline. In person spanish translator at the bedside.   Vital Signs .    Vitals:   04/12/23 2033 04/13/23 0004 04/13/23 0318 04/13/23 0803  BP: 119/85 117/85 106/66 105/77  Pulse: (!) 127 (!) 110 (!) 109 (!) 115  Resp:  13 16 16   Temp: 98.5 F (36.9 C) 98.1 F (36.7 C) 97.6 F (36.4 C) 97.6 F (36.4 C)  TempSrc:  Oral Axillary Axillary  SpO2: 97% 95% 97% 94%  Weight:   94.2 kg   Height:        Intake/Output Summary (Last 24 hours) at 04/13/2023 0823 Last data filed at 04/13/2023 0600 Gross per 24 hour  Intake 1204 ml  Output 900 ml  Net 304 ml      04/13/2023    3:18 AM 04/12/2023    3:55 AM 04/11/2023    3:06 AM  Last 3 Weights  Weight (lbs) 207 lb 9.6 oz 207 lb 14.3 oz 214 lb 11.2 oz  Weight (kg) 94.167 kg 94.3 kg 97.387 kg      Telemetry/ECG    Atrial flutter 120s - Personally Reviewed  Physical Exam .   GEN: No acute distress on 4L [place] at baseline.   Neck: No JVD Cardiac: tachy, no murmurs, rubs, or gallops.  Respiratory: Slightly diminished in bases GI: Soft, nontender, non-distended  MS: No edema  Assessment & Plan .     Eddie Graham is a 88 y.o. male with a hx of chronic heart failure with reduced ejection fraction (EF 40-45% on 01/27/2023), coronary artery disease s/p CABG in 2011, paroxysmal atrial fibrillation/atrial flutter, AAA, sigmoid colon abscess s/p end colostomy, and chronic kidney disease IIIb who is being seen for the evaluation of chest pain.   CAD s/p CABG Chest pain -- Patient presented to the ED with acute on chronic chest pain. EKG with chronic RBBB, LAFB, and no acute ischemic changes. Troponin 25->25. Chest pain seems atypical in nature, not consistent with ACS -- no ASA with need for OAC, continue statin, Toprol  XL   Paroxysmal atrial fibrillation/flutter -- remains in atrial flutter, rates 120s -- continue Toprol XL 50mg  BID  -- Continue Eliquis 2.5mg  BID  -- planned for TEE/DCCV tomorrow, hopefully will maintain sinus rhythm. May need to consider addition of amiodarone   Acute on chronic HFrEF Acute on chronic hypoxic respiratory failure -- presented with volume overload symptoms with bilateral pleural effusions and mild pulmonary edema on imaging.  -- BNP 602.3. Last echo from October 2024 showed LVEF 40-45% with global hypokinesis. Echo this admission with drop in LVEF to 25-30%, global hypokinesis, moderately reduced RV. 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 Plan for SGTL2i after TEE/DCCV   CKD IIIb -- remains stable around 1.6-1.7  Per primary team DM Abd pain BPH COPD    For questions or updates, please contact [facility] Please consult [email or web address] for contact info under        Signed, Laverda Page, NP   I have personally seen and examined this patient. I agree with the assessment and plan as outlined above.  No complaints today. He remains in atrial flutter with rates in the 120s.  He remains on Toprol and Eliqus. Plans for TEE guided cardioversion tomorrow. NPO at midnight.   Verne Carrow, MD, [facility] 04/13/2023 9:58 AM

## 2023-04-13 NOTE — Progress Notes (Signed)
Physical Therapy Treatment Patient Details Name: Eddie Graham MRN: [id number] DOB: Oct 27, 1934 Today's Date: 04/13/2023   History of Present Illness 88 y.o. male admitted 04/10/23 with chest pain, SOB; no evidence of acute ischemia. Workup for acute on chronic hypoxic respiratory failure, CHF, bilateral pleural effusions. Plan for TEE/DCCV. PMH includes CAD, COPD, HTN, AAA, CKD, DM, neuropathy, afib/flutter, colostomy, depression, back pain.    PT Comments  Patient resting in bed at start of session and asleep but easily alerted by auditory stimuli and agreeable to mobility. Pt able to complete bed mobility with extra time and use of bed features. HR stable in 110's at EOB and SpO2 97% on 4L/min. Pt provided SPC and sup for safety with sit<>stand from EOB and short bout of ambulation in room, pt excited to be OOB and eager to sit in recliner. Pt alternating between mumbled english and spanish, (tele-interpreter Marquita Palms [phone] utilized throughout session). EOS pt animated and asking for coffee and food despite reporting some discomfort in abdomen. Pt agreeable to remain in chair, Alarm on and call bell within reach. Pt verbalized understanding to call for assist to mobilize. Will continue to progress pt as able.    If plan is discharge home, recommend the following: A little help with bathing/dressing/bathroom;Assistance with cooking/housework;Direct supervision/assist for medications management;Direct supervision/assist for financial management;Assist for transportation   Can travel by private vehicle        Equipment Recommendations  None recommended by PT    Recommendations for Other Services       Precautions / Restrictions Precautions Precautions: Fall;Other (comment) Precaution Comments: colostomy; watch SpO2 (wears supplemental O2 PRN at home) Restrictions Weight Bearing Restrictions Per Provider Order: No     Mobility  Bed Mobility Overal bed mobility: Modified  Independent Bed Mobility: Supine to Sit     Supine to sit: HOB elevated, Used rails     General bed mobility comments: use of bed features    Transfers Overall transfer level: Needs assistance Equipment used: Straight cane Transfers: Sit to/from Stand Sit to Stand: Supervision           General transfer comment: sup for safety, pt steady with SPC in Rt UE.    Ambulation/Gait Ambulation/Gait assistance: Supervision Gait Distance (Feet): 20 Feet Assistive device: Straight cane Gait Pattern/deviations: Step-through pattern, Decreased stride length Gait velocity: Decreased     General Gait Details: overall pt steady with slightly wide BOS and SPC in Rt UE for gait. assist for lines. pt easily distracted by external stimuli and required cues to maintain focus on task.   Stairs             Wheelchair Mobility     Tilt Bed    Modified Rankin (Stroke Patients Only)       Balance Overall balance assessment: Needs assistance Sitting-balance support: No upper extremity supported, Feet supported Sitting balance-Leahy Scale: Good     Standing balance support: No upper extremity supported, During functional activity, Bilateral upper extremity supported Standing balance-Leahy Scale: Fair                              Cognition Arousal: Alert Behavior During Therapy: WFL for tasks assessed/performed, Restless, Impulsive Overall Cognitive Status: No family/caregiver present to determine baseline cognitive functioning  Exercises      General Comments        Pertinent Vitals/Pain Pain Assessment Pain Assessment: Faces Faces Pain Scale: Hurts little more Pain Descriptors / Indicators: Sore Pain Intervention(s): Limited activity within patient's tolerance, Monitored during session, Repositioned    Home Living                          Prior Function            PT Goals  (current goals can now be found in the care plan section) Acute Rehab PT Goals Patient Stated Goal: feel better PT Goal Formulation: With patient Time For Goal Achievement: 04/25/23 Potential to Achieve Goals: Good Progress towards PT goals: Progressing toward goals    Frequency    Min 1X/week      PT Plan      Co-evaluation              AM-PAC PT "6 Clicks" Mobility   Outcome Measure  Help needed turning from your back to your side while in a flat bed without using bedrails?: None Help needed moving from lying on your back to sitting on the side of a flat bed without using bedrails?: None Help needed moving to and from a bed to a chair (including a wheelchair)?: A Little Help needed standing up from a chair using your arms (e.g., wheelchair or bedside chair)?: A Little Help needed to walk in hospital room?: A Little Help needed climbing 3-5 steps with a railing? : A Little 6 Click Score: 20    End of Session Equipment Utilized During Treatment: Gait belt;Oxygen Activity Tolerance: Patient tolerated treatment well Patient left: in chair;with call bell/phone within reach;with chair alarm set Nurse Communication: Mobility status PT Visit Diagnosis: Other abnormalities of gait and mobility (R26.89)     Time: 9147-8295 PT Time Calculation (min) (ACUTE ONLY): 26 min  Charges:    $Gait Training: 8-22 mins $Therapeutic Activity: 8-22 mins PT General Charges $$ ACUTE PT VISIT: 1 Visit                     Wynn Maudlin, DPT Acute Rehabilitation Services Office [phone]  04/13/23 12:39 PM

## 2023-04-13 NOTE — Plan of Care (Signed)
   Problem: Education: Goal: Ability to describe self-care measures that may prevent or decrease complications (Diabetes Survival Skills Education) will improve Outcome: Progressing Goal: Individualized Educational Video(s) Outcome: Progressing   Problem: Coping: Goal: Ability to adjust to condition or change in health will improve Outcome: Progressing   Problem: Fluid Volume: Goal: Ability to maintain a balanced intake and output will improve Outcome: Progressing   Problem: Health Behavior/Discharge Planning: Goal: Ability to identify and utilize available resources and services will improve Outcome: Progressing Goal: Ability to manage health-related needs will improve Outcome: Progressing   Problem: Metabolic: Goal: Ability to maintain appropriate glucose levels will improve Outcome: Progressing   Problem: Nutritional: Goal: Maintenance of adequate nutrition will improve Outcome: Progressing Goal: Progress toward achieving an optimal weight will improve Outcome: Progressing   Problem: Skin Integrity: Goal: Risk for impaired skin integrity will decrease Outcome: Progressing   Problem: Tissue Perfusion: Goal: Adequacy of tissue perfusion will improve Outcome: Progressing   Problem: Education: Goal: Knowledge of General Education information will improve Description: Including pain rating scale, medication(s)/side effects and non-pharmacologic comfort measures Outcome: Progressing   Problem: Health Behavior/Discharge Planning: Goal: Ability to manage health-related needs will improve Outcome: Progressing   Problem: Clinical Measurements: Goal: Ability to maintain clinical measurements within normal limits will improve Outcome: Progressing Goal: Will remain free from infection Outcome: Progressing Goal: Diagnostic test results will improve Outcome: Progressing Goal: Respiratory complications will improve Outcome: Progressing Goal: Cardiovascular complication will  be avoided Outcome: Progressing   Problem: Activity: Goal: Risk for activity intolerance will decrease Outcome: Progressing

## 2023-04-14 ENCOUNTER — Encounter (HOSPITAL_COMMUNITY): Payer: Self-pay | Admitting: Anesthesiology

## 2023-04-14 ENCOUNTER — Inpatient Hospital Stay (HOSPITAL_COMMUNITY): Payer: 59

## 2023-04-14 ENCOUNTER — Encounter (HOSPITAL_COMMUNITY)
Admission: EM | Disposition: A | Discharge status: Home Health | Payer: Self-pay | Source: Home / Self Care | Attending: Internal Medicine

## 2023-04-14 DIAGNOSIS — R079 Chest pain, unspecified: Secondary | ICD-10-CM | POA: Diagnosis not present

## 2023-04-14 DIAGNOSIS — I4892 Unspecified atrial flutter: Secondary | ICD-10-CM | POA: Diagnosis not present

## 2023-04-14 LAB — CBC
HCT: 43.5 % (ref 39.0–52.0)
Hemoglobin: 13.8 g/dL (ref 13.0–17.0)
MCH: 28.2 pg (ref 26.0–34.0)
MCHC: 31.7 g/dL (ref 30.0–36.0)
MCV: 88.8 fL (ref 80.0–100.0)
Platelets: 186 10*3/uL (ref 150–400)
RBC: 4.9 MIL/uL (ref 4.22–5.81)
RDW: 14.8 % (ref 11.5–15.5)
WBC: 6.6 10*3/uL (ref 4.0–10.5)
nRBC: 0 % (ref 0.0–0.2)

## 2023-04-14 LAB — MAGNESIUM: Magnesium: 2.3 mg/dL (ref 1.7–2.4)

## 2023-04-14 LAB — GLUCOSE, CAPILLARY
Glucose-Capillary: 148 mg/dL — ABNORMAL HIGH (ref 70–99)
Glucose-Capillary: 144 mg/dL — ABNORMAL HIGH (ref 70–99)
Glucose-Capillary: 198 mg/dL — ABNORMAL HIGH (ref 70–99)
Glucose-Capillary: 140 mg/dL — ABNORMAL HIGH (ref 70–99)

## 2023-04-14 LAB — COMPREHENSIVE METABOLIC PANEL
ALT: 16 U/L (ref 0–44)
AST: 16 U/L (ref 15–41)
Albumin: 2.6 g/dL — ABNORMAL LOW (ref 3.5–5.0)
Alkaline Phosphatase: 54 U/L (ref 38–126)
Anion gap: 10 (ref 5–15)
BUN: 36 mg/dL — ABNORMAL HIGH (ref 8–23)
CO2: 28 mmol/L (ref 22–32)
Calcium: 8.3 mg/dL — ABNORMAL LOW (ref 8.9–10.3)
Chloride: 99 mmol/L (ref 98–111)
Creatinine, Ser: 1.91 mg/dL — ABNORMAL HIGH (ref 0.61–1.24)
GFR, Estimated: 34 mL/min — ABNORMAL LOW (ref >60)
Glucose, Bld: 215 mg/dL — ABNORMAL HIGH (ref 70–99)
Potassium: 3.8 mmol/L (ref 3.5–5.1)
Sodium: 137 mmol/L (ref 135–145)
Total Bilirubin: 1.1 mg/dL (ref <1.2)
Total Protein: 5.2 g/dL — ABNORMAL LOW (ref 6.5–8.1)

## 2023-04-14 LAB — TSH: TSH: 1.351 u[IU]/mL (ref 0.350–4.500)

## 2023-04-14 SURGERY — Surgical Case

## 2023-04-14 MED ORDER — SODIUM CHLORIDE 0.9 % IV SOLN
INTRAVENOUS | Status: DC
Start: 2023-04-14 — End: 2023-04-14

## 2023-04-14 MED ORDER — NITROGLYCERIN 0.4 MG SL SUBL
0.4000 mg | SUBLINGUAL_TABLET | Freq: Once | SUBLINGUAL | Status: AC
Start: 2023-04-14 — End: 2023-04-14
  Administered 2023-04-14: 0.4 mg via SUBLINGUAL

## 2023-04-14 MED ORDER — POTASSIUM CHLORIDE CRYS ER 20 MEQ PO TBCR
40.0000 meq | EXTENDED_RELEASE_TABLET | Freq: Once | ORAL | Status: AC
  Administered 2023-04-14: 40 meq via ORAL
  Filled 2023-04-14: qty 2

## 2023-04-14 MED ORDER — NITROGLYCERIN 0.4 MG SL SUBL
SUBLINGUAL_TABLET | SUBLINGUAL | Status: AC
  Filled 2023-04-14: qty 1

## 2023-04-14 NOTE — Progress Notes (Addendum)
1610 patient alert cardiac team with translator on a stick explaining TEE and cardioversion to patient. Patient agreeable and signs consent. CHG completed EKG complete teeth brushed  0900 all AM meds given. 1052 patient leaving floor to go to cath lab alert x4 on 4l [place]. 1205 patient back procedure not completed patient reporting lots gas colostomy sir released   1230 colostomy full stool NT helping patient empty

## 2023-04-14 NOTE — Progress Notes (Signed)
Mobility Specialist Progress Note:   04/14/23 1020  Mobility  Activity Ambulated with assistance in hallway  Level of Assistance Contact guard assist, steadying assist  Assistive Device Cane  Distance Ambulated (ft) 120 ft  Activity Response Tolerated well  Mobility Referral Yes  Mobility visit 1 Mobility  Mobility Specialist Start Time (ACUTE ONLY) 1020  Mobility Specialist Stop Time (ACUTE ONLY) 1040  Mobility Specialist Time Calculation (min) (ACUTE ONLY) 20 min   Pt agreeable to mobility session. Required steadying assist occasionally dt unsteadiness. VSS on 3LO2. Pt left in chair with all needs met, alarm on.  Addison Lank Mobility Specialist Please contact via SecureChat or  Rehab office at [phone]

## 2023-04-14 NOTE — Progress Notes (Signed)
PROGRESS NOTE  Rankin November GLO:[id number] DOB: 09/21/1934 DOA: 04/10/2023 PCP: Andreas Blower., MD   LOS: 4 days   Brief Narrative / Interim history: Patient is an 88 year old male with past medical history significant for heart failure with reduced EF (EF of 40 to 45%), s/p CABG, bladder cancer status post TURP, sigmoid colon abscess s/p end colostomy, hypertension, hyperlipidemia, paroxysmal atrial fibrillation on Eliquis, anemia, AAA and chronic kidney disease stage IIIb. Patient was admitted with chest pain and shortness of breath. Troponin on presentation was 25. Cardiology team is directing care. For TEE/DCCV once euvolemic. Plan is to continue rate control and Eliquis for now. Adequate diuresis. Chest pain is considered atypical. Patient is a poor historian   Subjective / 24h Interval events: Complains of abdominal pain as well as cough and sensation of globus  Assesement and Plan: Principal problem Acute on chronic systolic CHF-repeat 2D echo shows an EF of 25-30%.  Prior echo-EF of 40-45%.  BNP was elevated on admission, had bilateral pleural effusions and mild pulmonary edema.  Received IV Lasix, currently net - 3.5 L -further diuresis per cardiology  Active problems PAF flutter, on Eliquis-with RVR, tachycardic at times.  Continue anticoagulation with Eliquis, metoprolol. -Scheduled for TEE cardioversion today  Chest pain-atypical, flat troponins  Acute on chronic hypoxic respiratory failure secondary to bilateral pleural effusions and mild pulmonary edema -At baseline, patient is on 4 L nasal cannula.  Continue supplemental oxygen as indicated, but currently appears at baseline   CKD 3B, at baseline -Stable.  Baseline creatinine 1.6-1.9   Type 2 diabetes with hyperglycemia - Last hemoglobin A1c 7.4 on 01/20/2023. Continue sliding scale insulin coverage. Heart healthy, carb modified diet.   Coronary artery disease status post CABG -noted, continue home  regimen  Hypertension -continue metoprolol  Hyperlipidemia -continue statin   Chronic anxiety/depression- Resume home Cymbalta   BPH - Resume home Flomax   COPD - Resume home bronchodilators. Maintain O2 saturation above 90%   Obesity - BMI 33   Physical debility - PT OT assessment   Infrarenal AAA - Outpatient follow-up   Vague abdominal pain -continue bowel regimen   History of sigmoid abscess status post end colostomy - Colostomy care    Scheduled Meds:  apixaban  2.5 mg Oral BID   DULoxetine  60 mg Oral Daily   furosemide  20 mg Oral Daily   Glycerin (Adult)  1 suppository Rectal Once   insulin aspart  0-5 Units Subcutaneous QHS   insulin aspart  0-9 Units Subcutaneous TID WC   metoprolol succinate  50 mg Oral BID   montelukast  10 mg Oral QHS   pantoprazole  40 mg Oral BID   pneumococcal 20-valent conjugate vaccine  0.5 mL Intramuscular Tomorrow-1000   polyethylene glycol  17 g Oral BID   rosuvastatin  40 mg Oral QHS   senna-docusate  2 tablet Oral BID   tamsulosin  0.4 mg Oral Daily   traZODone  50 mg Oral QHS   umeclidinium-vilanterol  1 puff Inhalation Daily   Continuous Infusions:  sodium chloride 20 mL/hr at 04/14/23 0645   PRN Meds:.acetaminophen, albuterol, alum & mag hydroxide-simeth, gabapentin, melatonin, menthol-cetylpyridinium, prochlorperazine, sodium chloride  Current Outpatient Medications  Medication Instructions   acetaminophen (TYLENOL) 650 mg, Every 8 hours PRN   albuterol (VENTOLIN HFA) 108 (90 Base) MCG/ACT inhaler 2 puffs, Every 4 hours PRN   ANORO ELLIPTA 62.5-25 MCG/ACT AEPB 1 puff, Daily   apixaban (ELIQUIS) 2.5 mg, Oral, 2 times daily  Carboxymethylcellulose Sodium (EYE DROPS OP) 2 drops, As needed   DULoxetine (CYMBALTA) 60 mg, Daily   ferrous sulfate 325 mg, Daily with breakfast   furosemide (LASIX) 20 mg, Oral, Daily   gabapentin (NEURONTIN) 100-300 mg, At bedtime PRN   Insulin Pen Needle (TECHLITE PEN NEEDLES) 32G X 4 MM  MISC Use to inject Lantus at bedtime as directed   Jardiance 10 mg, Oral, Daily before breakfast   Lantus SoloStar 15 Units, Subcutaneous, Daily at bedtime   Melatonin 5 mg, At bedtime PRN   metoprolol succinate (TOPROL-XL) 100 mg, Oral, Daily, Take with or immediately following a meal.   montelukast (SINGULAIR) 10 mg, Daily at bedtime   pantoprazole (PROTONIX) 40 MG tablet Take 1 tablet by mouth 2 times daily.   polyethylene glycol (MIRALAX / GLYCOLAX) 17 g, Daily PRN   rosuvastatin (CRESTOR) 40 mg, Nightly   Semaglutide 14 mg, Daily   traMADol (ULTRAM) 50 mg, Oral, Every 6 hours PRN   traZODone (DESYREL) 50 mg, Daily at bedtime    Diet Orders (From admission, onward)     Start     Ordered   04/14/23 0449  Diet NPO time specified Except for: Sips with Meds  Diet effective now       Question:  Except for  Answer:  Sips with Meds   04/14/23 0449            DVT prophylaxis: apixaban (ELIQUIS) tablet 2.5 mg Start: 04/10/23 2200 apixaban (ELIQUIS) tablet 2.5 mg   Lab Results  Component Value Date   PLT 186 04/14/2023      Code Status: Full Code  Family Communication: Updated daughter over the phone  Status is: Inpatient Remains inpatient appropriate because: severity of ilness  Level of care: Telemetry Cardiac  Consultants:  Cardiology   Objective: Vitals:   04/13/23 1954 04/13/23 2200 04/14/23 0408 04/14/23 0743  BP: 95/71   99/83  Pulse: (!) 114  (!) 102 99  Resp:  20  20  Temp: 98 F (36.7 C)  97.7 F (36.5 C) 98.2 F (36.8 C)  TempSrc: Oral  Oral Oral  SpO2: 97% 94%  96%  Weight:    95.2 kg  Height:        Intake/Output Summary (Last 24 hours) at 04/14/2023 1056 Last data filed at 04/14/2023 0744 Gross per 24 hour  Intake 354 ml  Output 1400 ml  Net -1046 ml   Wt Readings from Last 3 Encounters:  04/14/23 95.2 kg  03/02/23 96.6 kg  03/01/23 96.2 kg    Examination:  Constitutional: NAD Eyes: lids and conjunctivae normal, no scleral  icterus ENMT: mmm Neck: normal, supple Respiratory: clear to auscultation bilaterally, no wheezing, no crackles. Normal respiratory effort.  Cardiovascular: Regular rate and rhythm, no murmurs / rubs / gallops.  Tachycardic Abdomen: soft, no distention, no tenderness. Bowel sounds positive.    Data Reviewed: I have independently reviewed following labs and imaging studies   CBC Recent Labs  Lab 04/10/23 1326 04/11/23 0506 04/12/23 0853 04/13/23 0344 04/14/23 0402  WBC 8.8 7.6 8.5 8.1 6.6  HGB 14.7 14.3 15.0 15.1 13.8  HCT 47.2 44.8 45.8 47.6 43.5  PLT 241 208 216 196 186  MCV 90.1 89.6 88.4 89.1 88.8  MCH 28.1 28.6 29.0 28.3 28.2  MCHC 31.1 31.9 32.8 31.7 31.7  RDW 15.3 15.2 15.2 15.1 14.8    Recent Labs  Lab 04/10/23 1326 04/10/23 1338 04/11/23 0506 04/12/23 0853 04/13/23 0344 04/14/23 0402  NA 139  --  139 137 137 137  K 3.9  --  3.9 3.6 3.6 3.8  CL 106  --  104 99 98 99  CO2 23  --  25 26 28 28   GLUCOSE 143*  --  138* 147* 139* 215*  BUN 19  --  22 28* 31* 36*  CREATININE 1.74*  --  1.65* 1.73* 1.73* 1.91*  CALCIUM 8.7*  --  8.6* 8.6* 8.4* 8.3*  AST 19  --   --   --  18 16  ALT 18  --   --   --  17 16  ALKPHOS 80  --   --   --  67 54  BILITOT 1.8*  --   --   --  1.5* 1.1  ALBUMIN 3.1*  --   --   --  2.9* 2.6*  MG  --   --  2.0 2.1 2.5* 2.3  LATICACIDVEN  --  1.7  --   --   --   --   TSH  --   --   --   --   --  1.351  BNP 602.3*  --   --   --   --   --     ------------------------------------------------------------------------------------------------------------------ No results for input(s): "CHOL", "HDL", "LDLCALC", "TRIG", "CHOLHDL", "LDLDIRECT" in the last 72 hours.  Lab Results  Component Value Date   HGBA1C 7.4 (H) 01/20/2023   ------------------------------------------------------------------------------------------------------------------ Recent Labs    04/14/23 0402  TSH 1.351    Cardiac Enzymes No results for input(s): "CKMB",  "TROPONINI", "MYOGLOBIN" in the last 168 hours.  Invalid input(s): "CK" ------------------------------------------------------------------------------------------------------------------    Component Value Date/Time   BNP 602.3 (H) 04/10/2023 1326    CBG: Recent Labs  Lab 04/13/23 0720 04/13/23 1128 04/13/23 1600 04/13/23 2109 04/14/23 0738  GLUCAP 112* 185* 233* 165* 148*    Recent Results (from the past 240 hours)  Resp panel by RT-PCR (RSV, Flu A&B, Covid) Anterior Nasal Swab     Status: None   Collection Time: 04/10/23  1:26 PM   Specimen: Anterior Nasal Swab  Result Value Ref Range Status   SARS Coronavirus 2 by RT PCR NEGATIVE NEGATIVE Final   Influenza A by PCR NEGATIVE NEGATIVE Final   Influenza B by PCR NEGATIVE NEGATIVE Final    Comment: (NOTE) The Xpert Xpress SARS-CoV-2/FLU/RSV plus assay is intended as an aid in the diagnosis of influenza from Nasopharyngeal swab specimens and should not be used as a sole basis for treatment. Nasal washings and aspirates are unacceptable for Xpert Xpress SARS-CoV-2/FLU/RSV testing.  Fact Sheet for Patients: BloggerCourse.com  Fact Sheet for Healthcare Providers: SeriousBroker.it  This test is not yet approved or cleared by the [place] FDA and has been authorized for detection and/or diagnosis of SARS-CoV-2 by FDA under an Emergency Use Authorization (EUA). This EUA will remain in effect (meaning this test can be used) for the duration of the COVID-19 declaration under Section 564(b)(1) of the Act, 21 U.S.C. section 360bbb-3(b)(1), unless the authorization is terminated or revoked.     Resp Syncytial Virus by PCR NEGATIVE NEGATIVE Final    Comment: (NOTE) Fact Sheet for Patients: BloggerCourse.com  Fact Sheet for Healthcare Providers: SeriousBroker.it  This test is not yet approved or cleared by the [place] FDA and has been authorized for detection and/or diagnosis of SARS-CoV-2 by FDA under an Emergency Use Authorization (EUA). This EUA will remain in effect (meaning this test can be used) for the duration of the  COVID-19 declaration under Section 564(b)(1) of the Act, 21 U.S.C. section 360bbb-3(b)(1), unless the authorization is terminated or revoked.  Performed at [facility], [address], [place], [place] [address]   Blood culture (routine x 2)     Status: None (Preliminary result)   Collection Time: 04/10/23  1:29 PM   Specimen: BLOOD  Result Value Ref Range Status   Specimen Description BLOOD RIGHT ANTECUBITAL  Final   Special Requests   Final    BOTTLES DRAWN AEROBIC AND ANAEROBIC Blood Culture results may not be optimal due to an inadequate volume of blood received in culture bottles   Culture   Final    NO GROWTH 4 DAYS Performed at [facility], [address], [place], [place] [address]    Report Status PENDING  Incomplete  Blood culture (routine x 2)     Status: None (Preliminary result)   Collection Time: 04/10/23  1:34 PM   Specimen: BLOOD LEFT WRIST  Result Value Ref Range Status   Specimen Description BLOOD LEFT WRIST  Final   Special Requests   Final    BOTTLES DRAWN AEROBIC AND ANAEROBIC Blood Culture adequate volume   Culture   Final    NO GROWTH 4 DAYS Performed at [facility], [address] [place], [place] [address]    Report Status PENDING  Incomplete     Radiology Studies: No results found.    Pamella Pert, MD, PhD [facility]  Between 7 am - 7 pm I am available, please contact me via Amion (for emergencies) or Securechat (non urgent messages)  Between 7 pm - 7 am I am not available, please contact night coverage MD/APP via Amion

## 2023-04-14 NOTE — H&P (Signed)
Patient arrived in endo for TEE/DCC Interpretor present He was complaining of 10/10 SSCP ECG non acute aflutter rate 100 RBBB Nitroglycerin being given  Hemodynamics stable  Poor dentition Clear lungs SEM no rub Abdomen benign colostomy Plus one edema  Patient is borderline for DCC/TEE to begin with given age and low sats on 4 L's oxygen chronically. Dr Jean Rosenthal anesthesia and myself did not feel comfortable putting patient to sleep for two procedures given added complication of active chest pai. He has a distant history of CABG 2011 with ischemic cardiomyopathy and may need to hold blood thinners for heat cath   Discussed with rounding MD Dr Excell Seltzer Will make sure he had his 50 mg of Toprol this am  Charlton Haws MD [facility]

## 2023-04-14 NOTE — Progress Notes (Signed)
HR elevated that's why patient is admitted to PCU TEE cardioversion cancelled today   04/14/23 1205  Assess: MEWS Score  Temp 97.8 F (36.6 C)  BP 94/69  MAP (mmHg) 79  ECG Heart Rate (!) 110  Resp 16  Level of Consciousness Alert  SpO2 97 %  O2 Device Nasal Cannula  Patient Activity (if Appropriate) In bed  O2 Flow Rate (L/min) 4 L/min  Assess: MEWS Score  MEWS Temp 0  MEWS Systolic 1  MEWS Pulse 1  MEWS RR 0  MEWS LOC 0  MEWS Score 2  MEWS Score Color Yellow  Assess: if the MEWS score is Yellow or Red  Were vital signs accurate and taken at a resting state? Yes  Does the patient meet 2 or more of the SIRS criteria? No  MEWS guidelines implemented  No, previously yellow, continue vital signs every 4 hours  Assess: SIRS CRITERIA  SIRS Temperature  0  SIRS Respirations  0  SIRS Pulse 1  SIRS WBC 0  SIRS Score Sum  1

## 2023-04-14 NOTE — Plan of Care (Signed)

## 2023-04-14 NOTE — Anesthesia Preprocedure Evaluation (Addendum)
Anesthesia Evaluation    Airway        Dental   Pulmonary shortness of breath, COPD,  COPD inhaler          Cardiovascular hypertension, Pt. on medications and Pt. on home beta blockers + Peripheral Vascular Disease and +CHF    04/12/2023 EF 25 to 30%.  1.The LV has severely decreased function. The left ventricle demonstrates global hypokinesis.   2. RVF is moderately reduced. The right ventricular size is normal.   3. Left atrial size was mildly dilated.   4. Right atrial size was mildly dilated.   5. The mitral valve is normal in structure. Color Doppler not performed   6. The aortic valve is tricuspid. There is mild calcification of the aortic valve.     Neuro/Psych    GI/Hepatic   Endo/Other  diabetes, Insulin Dependent  semaglutide  Renal/GU Renal InsufficiencyRenal disease   H/o bladder cancer    Musculoskeletal  (+) Arthritis ,    Abdominal   Peds  Hematology eliquis   Anesthesia Other Findings   Reproductive/Obstetrics                             Anesthesia Physical Anesthesia Plan  ASA: 4  Anesthesia Plan: MAC   Post-op Pain Management: Minimal or no pain anticipated   Induction:   PONV Risk Score and Plan: 1 and Treatment may vary due to age or medical condition  Airway Management Planned: Nasal Cannula and Natural Airway  Additional Equipment: None  Intra-op Plan:   Post-operative Plan:   Informed Consent:   Plan Discussed with:   Anesthesia Plan Comments: (Pt with chest pain in holding, Dr. Eden Emms attending, TEE/Cardioversion postponed)       Anesthesia Quick Evaluation

## 2023-04-14 NOTE — Plan of Care (Signed)
  Problem: Health Behavior/Discharge Planning: Goal: Ability to safely manage health-related needs after discharge will improve 04/14/2023 1241 by Aretta Nip, RN Outcome: Progressing 04/14/2023 1239 by Aretta Nip, RN    Problem: Cardiac: Goal: Ability to achieve and maintain adequate cardiopulmonary perfusion will improve 04/14/2023 1241 by Aretta Nip, RN Outcome: Not Progressing Note: AFIB  Problem: Activity: Goal: Ability to tolerate increased activity will improve 04/14/2023 1241 by Aretta Nip, RN Outcome: Progressing  Problem: Education: Goal: Knowledge of disease or condition will improve 04/14/2023 1241 by Aretta Nip, RN Outcome: Progressing 04/14/2023 1239 by Aretta Nip, RN Outcome: Progressing 04/14/2023 1239 by Aretta Nip, RN Outcome: Not Progressing Goal: Understanding of medication regimen will improve 04/14/2023 1241 by Aretta Nip, RN Outcome: Progressing  Problem: Skin Integrity: Goal: Risk for impaired skin integrity will decrease 04/14/2023 1241 by Aretta Nip, RN Outcome: Progressing  Problem: Elimination: Goal: Will not experience complications related to bowel motility 04/14/2023 1241 by Aretta Nip, RN Outcome: Not Progressing Note: Abd pain lots stool noted in colon meds given to help stool pass 04/14/2023 1239 by Aretta Nip, RN Outcome: Not Progressing  Problem: Coping: Goal: Level of anxiety will decrease 04/14/2023 1241 by Aretta Nip, RN Outcome: Progressing  Problem: Clinical Measurements: Goal: Ability to maintain clinical measurements within normal limits will improve 04/14/2023 1241 by Aretta Nip, RN Outcome: Progressing 04/14/2023 1239 by Aretta Nip, RN Outcome: Progressing 04/14/2023 1239 by Aretta Nip, RN Outcome: Not Progressing 04/14/2023 1238 by Aretta Nip, RN Outcome: Progressing Goal: Will remain free from infection 04/14/2023 1241 by Aretta Nip, RN Outcome: Progressing 04/14/2023 1239 by Aretta Nip, RN Outcome: Progressing 04/14/2023 1239 by Aretta Nip, RN Outcome: Not Progressing 04/14/2023 1238 by Aretta Nip, RN Outcome: Progressing Goal: Diagnostic test results will improve 04/14/2023 1241 by Aretta Nip, RN Outcome: Progressing  Goal: Respiratory complications will improve 04/14/2023 1241 by Aretta Nip, RN Outcome: Progressing Goal: Cardiovascular complication will be avoided 04/14/2023 1241 by Aretta Nip, RN Outcome: Progressing Goal: Will not experience complications related to urinary retention 04/14/2023 1241 by Aretta Nip, RN Outcome: Progressing Problem: Nutritional: Goal: Maintenance of adequate nutrition will improve 04/14/2023 1241 by Aretta Nip, RN Outcome: Progressing Goal: Progress toward achieving an optimal weight will improve 04/14/2023 1241 by Aretta Nip, RN Outcome: Progressing Problem: Fluid Volume: Goal: Ability to maintain a balanced intake and output will improve 04/14/2023 1241 by Aretta Nip, RN Outcome: Progressing Problem: Coping: Goal: Ability to adjust to condition or change in health will improve 04/14/2023 1241 by Aretta Nip, RN Outcome: Progressing Problem: Education: Goal: Ability to describe self-care measures that may prevent or decrease complications (Diabetes Survival Skills Education) will improve 04/14/2023 1241 by Aretta Nip, RN Outcome: Progressing  Goal: Individualized Educational Video(s) 04/14/2023 1241 by Aretta Nip, RN Outcome: Progressing  Problem: Tissue Perfusion: Goal: Adequacy of tissue perfusion will improve 04/14/2023 1241 by Aretta Nip, RN Outcome: Progressing

## 2023-04-14 NOTE — Progress Notes (Signed)
Occupational Therapy Treatment Patient Details Name: Eddie Graham MRN: [id number] DOB: Feb 28, 1935 Today's Date: 04/14/2023   History of present illness 88 y.o. male admitted 04/10/23 with chest pain, SOB; no evidence of acute ischemia. Workup for acute on chronic hypoxic respiratory failure, CHF, bilateral pleural effusions. Plan for TEE/DCCV. PMH includes CAD, COPD, HTN, AAA, CKD, DM, neuropathy, afib/flutter, colostomy, depression, back pain.   OT comments  Pt making steady progress towards OT goals this session. Pt continues to present with decreased activity tolerance impacting pts ability to complete ADLs independently . Session focused on functional mobility as precursor to higher level ADls. Pt currently requires supervision for sit>stands and CGA for functional ambulation greater than a household distance with SPC. Pt impulsive throughout mobility with pt reaching to various items in hallway and in the room, however feel this is pts baseline personality. Pt would continue to benefit from skilled occupational therapy while admitted and after d/c to address the below listed limitations in order to improve overall functional mobility and facilitate independence with BADL participation. DC plan remains appropriate, will follow acutely per POC.         If plan is discharge home, recommend the following:  A little help with bathing/dressing/bathroom;Assistance with cooking/housework;Direct supervision/assist for medications management   Equipment Recommendations  Tub/shower seat    Recommendations for Other Services      Precautions / Restrictions Precautions Precautions: Fall;Other (comment) Precaution Comments: colostomy; watch SpO2 (wears supplemental O2 PRN at home) Restrictions Weight Bearing Restrictions Per Provider Order: No       Mobility Bed Mobility Overal bed mobility: Needs Assistance       Supine to sit: HOB elevated, Used rails, Supervision      General bed mobility comments: supervision for safety and line mgmt    Transfers Overall transfer level: Needs assistance Equipment used: Straight cane Transfers: Sit to/from Stand Sit to Stand: Supervision           General transfer comment: supervision to rise from EOB     Balance Overall balance assessment: Needs assistance Sitting-balance support: No upper extremity supported, Feet supported Sitting balance-Leahy Scale: Good     Standing balance support: No upper extremity supported, During functional activity Standing balance-Leahy Scale: Fair                             ADL either performed or assessed with clinical judgement   ADL Overall ADL's : Needs assistance/impaired         Upper Body Bathing: Minimal assistance;Sitting Upper Body Bathing Details (indicate cue type and reason): simulated via UB dressing     Upper Body Dressing : Minimal assistance;Sitting Upper Body Dressing Details (indicate cue type and reason): to don back side gown     Toilet Transfer: Contact guard assist;Ambulation;Cueing for safety (single point cane)           Functional mobility during ADLs: Contact guard assist;Cane;Cueing for safety General ADL Comments: ADL participation impacted by decreased activity tolerance    Extremity/Trunk Assessment Upper Extremity Assessment Upper Extremity Assessment: Overall WFL for tasks assessed   Lower Extremity Assessment Lower Extremity Assessment: Defer to PT evaluation        Vision Baseline Vision/History: 1 Wears glasses Ability to See in Adequate Light: 0 Adequate Patient Visual Report: No change from baseline     Perception Perception Perception: Within Functional Limits   Praxis Praxis Praxis: WFL    Cognition Arousal: Alert Behavior During  Therapy: WFL for tasks assessed/performed, Restless, Impulsive Overall Cognitive Status: No family/caregiver present to determine baseline cognitive functioning                                  General Comments: pt appropriate, joking around a lot with therapist and interpreter, pt impulsively walking up to items in hallway pulling them out of the way such as scale, pt states that he was on 20 L of O2 at home. however feel pt may likely be at baseline        Exercises      Shoulder Instructions       General Comments pt on 4L [place] duirng session, SpO2 WFL. HR max 117 bpm, education provided on energy conservation strategies for home. inquired about additional family support at home however pt reports it is just him and that he does all the IADLs    Pertinent Vitals/ Pain       Pain Assessment Pain Assessment: No/denies pain  Home Living                                          Prior Functioning/Environment              Frequency  Min 1X/week        Progress Toward Goals  OT Goals(current goals can now be found in the care plan section)  Progress towards OT goals: Progressing toward goals  Acute Rehab OT Goals Patient Stated Goal: none stated Time For Goal Achievement: 04/26/23 Potential to Achieve Goals: Good  Plan      Co-evaluation                 AM-PAC OT "6 Clicks" Daily Activity     Outcome Measure   Help from another person eating meals?: None Help from another person taking care of personal grooming?: A Little Help from another person toileting, which includes using toliet, bedpan, or urinal?: A Little Help from another person bathing (including washing, rinsing, drying)?: A Little Help from another person to put on and taking off regular upper body clothing?: None Help from another person to put on and taking off regular lower body clothing?: A Little 6 Click Score: 20    End of Session Equipment Utilized During Treatment: Gait belt;Rolling walker (2 wheels);Oxygen;Other (comment) (4L [place])  OT Visit Diagnosis: Unsteadiness on feet (R26.81);Other abnormalities of gait  and mobility (R26.89);Muscle weakness (generalized) (M62.81)   Activity Tolerance Patient tolerated treatment well   Patient Left in chair;with call bell/phone within reach;with chair alarm set   Nurse Communication Mobility status        Time: 1433-1450 OT Time Calculation (min): 17 min  Charges: OT General Charges $OT Visit: 1 Visit OT Treatments $Self Care/Home Management : 8-22 mins  Lenor Derrick., COTA/L Acute Rehabilitation Services [phone]   Barron Schmid 04/14/2023, 3:10 PM

## 2023-04-14 NOTE — Progress Notes (Addendum)
Patient Name: Eddie Graham Date of Encounter: 04/14/2023 [facility] Cardiologist: Rollene Rotunda, MD   Interval Summary  .    Spanish translator [phone] used this morning. Discussed TEE/DCCV again in depth with patient and he voiced understanding. Complains of ongoing abd pain/discomfort  Vital Signs .    Vitals:   04/13/23 1954 04/13/23 2200 04/14/23 0408 04/14/23 0743  BP: 95/71   99/83  Pulse: (!) 114  (!) 102 99  Resp:  20  20  Temp: 98 F (36.7 C)  97.7 F (36.5 C) 98.2 F (36.8 C)  TempSrc: Oral  Oral Oral  SpO2: 97% 94%  96%  Weight:    95.2 kg  Height:        Intake/Output Summary (Last 24 hours) at 04/14/2023 0820 Last data filed at 04/14/2023 0744 Gross per 24 hour  Intake 590 ml  Output 1500 ml  Net -910 ml      04/14/2023    7:43 AM 04/13/2023    3:18 AM 04/12/2023    3:55 AM  Last 3 Weights  Weight (lbs) 209 lb 12.8 oz 207 lb 9.6 oz 207 lb 14.3 oz  Weight (kg) 95.165 kg 94.167 kg 94.3 kg      Telemetry/ECG    Atrial flutter rates 80-100s - Personally Reviewed  Physical Exam .   GEN: No acute distress, remains on 4L which is his baseline.   Neck: No JVD Cardiac: RRR, no murmurs, rubs, or gallops.  Respiratory: Clear to auscultation bilaterally. GI: Soft, nontender, non-distended  MS: 1+ bilateral LE edema  Assessment & Plan .     Eddie Graham is a 88 y.o. male with a hx of chronic heart failure with reduced ejection fraction (EF 40-45% on 01/27/2023), coronary artery disease s/p CABG in 2011, paroxysmal atrial fibrillation/atrial flutter, AAA, sigmoid colon abscess s/p end colostomy, and chronic kidney disease IIIb who is being seen for the evaluation of chest pain.   CAD s/p CABG Chest pain -- Patient presented to the ED with acute on chronic chest pain. EKG with chronic RBBB, LAFB, and no acute ischemic changes. Troponin 25->25. Chest pain seems atypical in nature, not consistent with ACS -- no ASA  with need for OAC, continue statin, Toprol XL   Paroxysmal atrial fibrillation/flutter -- remains in atrial flutter, rates improved to 80-100s -- continue Toprol XL 50mg  BID  -- Continue Eliquis 2.5mg  BID  -- planned for TEE/DCCV today, hopefully will maintain sinus rhythm. May need to consider addition of amiodarone    Acute on chronic HFrEF Acute on chronic hypoxic respiratory failure -- presented with volume overload symptoms with bilateral pleural effusions and mild pulmonary edema on imaging.  -- BNP 602.3. Last echo from October 2024 showed LVEF 40-45% with global hypokinesis. Echo this admission with drop in LVEF to 25-30%, global hypokinesis, moderately reduced RV. 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  -- GDMT: on Toprol XL 50mg  BID, no room for ARB, ARNi, MRA with CKD. Plan for SGTL2i after TEE/DCCV   CKD IIIb -- remains stable around 1.6-1.7, slight increase to 1.9 this morning -- follow BMET   Per primary team DM Abd pain BPH COPD  For questions or updates, please contact [facility] Please consult [email or web address] for contact info under        Signed, Laverda Page, NP    ATTENDING ATTESTATION:  After conducting a review of all available clinical information with the care  team, interviewing the patient, and performing a physical exam, I agree with the findings and plan described in this note.   GEN: No acute distress.   HEENT:  MMM, no JVD, no scleral icterus Cardiac: Tachycardic, no murmurs, rubs, or gallops.  Respiratory: Clear to auscultation bilaterally. GI: Soft, nontender, non-distended; colostomy bag in place MS: No edema; No deformity. Neuro:  Nonfocal  Vasc:  +2 radial pulses  My interview was conducted via phone interpreter:  Patient scheduled for TEE CV this AM.  This was cancelled due to chest pain.  Patient had KUB which demonstrated large amount of stool.  His nurse  emptied his colostomy bag and he had much more stool output.  He now feels much better.  Will see if we can proceed with TEE CV in AM.  Start scheduled Miralax.  Alverda Skeans, MD Pager [phone]

## 2023-04-15 ENCOUNTER — Encounter (HOSPITAL_COMMUNITY)
Admission: EM | Disposition: A | Discharge status: Home Health | Payer: Self-pay | Source: Home / Self Care | Attending: Internal Medicine

## 2023-04-15 ENCOUNTER — Inpatient Hospital Stay (HOSPITAL_COMMUNITY): Payer: 59

## 2023-04-15 ENCOUNTER — Encounter (HOSPITAL_COMMUNITY): Payer: Self-pay | Admitting: Internal Medicine

## 2023-04-15 ENCOUNTER — Inpatient Hospital Stay (HOSPITAL_COMMUNITY): Payer: 59 | Admitting: Anesthesiology

## 2023-04-15 DIAGNOSIS — I2510 Atherosclerotic heart disease of native coronary artery without angina pectoris: Secondary | ICD-10-CM

## 2023-04-15 DIAGNOSIS — I129 Hypertensive chronic kidney disease with stage 1 through stage 4 chronic kidney disease, or unspecified chronic kidney disease: Secondary | ICD-10-CM | POA: Diagnosis not present

## 2023-04-15 DIAGNOSIS — I4891 Unspecified atrial fibrillation: Secondary | ICD-10-CM

## 2023-04-15 DIAGNOSIS — N183 Chronic kidney disease, stage 3 unspecified: Secondary | ICD-10-CM

## 2023-04-15 DIAGNOSIS — I340 Nonrheumatic mitral (valve) insufficiency: Secondary | ICD-10-CM

## 2023-04-15 DIAGNOSIS — I4892 Unspecified atrial flutter: Secondary | ICD-10-CM | POA: Diagnosis not present

## 2023-04-15 HISTORY — PX: CARDIOVERSION: EP1203

## 2023-04-15 HISTORY — PX: TRANSESOPHAGEAL ECHOCARDIOGRAM (CATH LAB): EP1270

## 2023-04-15 LAB — CULTURE, BLOOD (ROUTINE X 2)
Culture: NO GROWTH
Culture: NO GROWTH
Special Requests: ADEQUATE

## 2023-04-15 LAB — COMPREHENSIVE METABOLIC PANEL
ALT: 18 U/L (ref 0–44)
AST: 17 U/L (ref 15–41)
Albumin: 3 g/dL — ABNORMAL LOW (ref 3.5–5.0)
Alkaline Phosphatase: 62 U/L (ref 38–126)
Anion gap: 10 (ref 5–15)
BUN: 30 mg/dL — ABNORMAL HIGH (ref 8–23)
CO2: 27 mmol/L (ref 22–32)
Calcium: 8.8 mg/dL — ABNORMAL LOW (ref 8.9–10.3)
Chloride: 100 mmol/L (ref 98–111)
Creatinine, Ser: 1.74 mg/dL — ABNORMAL HIGH (ref 0.61–1.24)
GFR, Estimated: 38 mL/min — ABNORMAL LOW (ref >60)
Glucose, Bld: 156 mg/dL — ABNORMAL HIGH (ref 70–99)
Potassium: 4.4 mmol/L (ref 3.5–5.1)
Sodium: 137 mmol/L (ref 135–145)
Total Bilirubin: 1.1 mg/dL (ref <1.2)
Total Protein: 5.9 g/dL — ABNORMAL LOW (ref 6.5–8.1)

## 2023-04-15 LAB — GLUCOSE, CAPILLARY
Glucose-Capillary: 157 mg/dL — ABNORMAL HIGH (ref 70–99)
Glucose-Capillary: 162 mg/dL — ABNORMAL HIGH (ref 70–99)
Glucose-Capillary: 199 mg/dL — ABNORMAL HIGH (ref 70–99)
Glucose-Capillary: 161 mg/dL — ABNORMAL HIGH (ref 70–99)

## 2023-04-15 LAB — OVA + PARASITE EXAM

## 2023-04-15 LAB — ECHO TEE

## 2023-04-15 LAB — O&P RESULT

## 2023-04-15 SURGERY — TRANSESOPHAGEAL ECHOCARDIOGRAM (TEE) (CATHLAB)
Anesthesia: General

## 2023-04-15 MED ORDER — SODIUM CHLORIDE 0.9 % IV SOLN: INTRAVENOUS | Status: DC | PRN

## 2023-04-15 MED ORDER — LIDOCAINE 2% (20 MG/ML) 5 ML SYRINGE
INTRAMUSCULAR | Status: DC | PRN
  Administered 2023-04-15: 60 mg via INTRAVENOUS

## 2023-04-15 MED ORDER — PROPOFOL 10 MG/ML IV BOLUS
INTRAVENOUS | Status: DC | PRN
  Administered 2023-04-15: 20 mg via INTRAVENOUS

## 2023-04-15 MED ORDER — PROPOFOL 500 MG/50ML IV EMUL
INTRAVENOUS | Status: DC | PRN
  Administered 2023-04-15: 65 ug/kg/min via INTRAVENOUS

## 2023-04-15 MED ORDER — PHENOL 1.4 % MT LIQD: 1.0000 | OROMUCOSAL | Status: DC | PRN

## 2023-04-15 SURGICAL SUPPLY — 1 items: PAD DEFIB RADIO PHYSIO CONN (PAD) ×1 IMPLANT

## 2023-04-15 NOTE — Anesthesia Postprocedure Evaluation (Signed)
Anesthesia Post Note  Patient: Eddie Graham  Procedure(s) Performed: TRANSESOPHAGEAL ECHOCARDIOGRAM CARDIOVERSION     Patient location during evaluation: Cath Lab Anesthesia Type: General Level of consciousness: awake and alert Pain management: pain level controlled Vital Signs Assessment: post-procedure vital signs reviewed and stable Respiratory status: spontaneous breathing, nonlabored ventilation, respiratory function stable and patient connected to nasal cannula oxygen Cardiovascular status: blood pressure returned to baseline and stable Postop Assessment: no apparent nausea or vomiting Anesthetic complications: no  No notable events documented.  Last Vitals:  Vitals:   04/15/23 1317 04/15/23 1334  BP: 112/74 120/89  Pulse: 73   Resp: 15 14  Temp:  36.6 C  SpO2: 92%     Last Pain:  Vitals:   04/15/23 1334  TempSrc: Oral  PainSc: 0-No pain                 Jiyan Walkowski,W. EDMOND

## 2023-04-15 NOTE — Anesthesia Preprocedure Evaluation (Addendum)
Anesthesia Evaluation  Patient identified by MRN, date of birth, ID band Patient awake    Reviewed: Allergy & Precautions, H&P , NPO status , Patient's Chart, lab work & pertinent test results  Airway Mallampati: III  TM Distance: >3 FB Neck ROM: Full    Dental no notable dental hx. (+) Poor Dentition, Dental Advisory Given   Pulmonary shortness of breath, asthma , COPD,  COPD inhaler   Pulmonary exam normal breath sounds clear to auscultation       Cardiovascular hypertension, Pt. on medications and Pt. on home beta blockers + CAD  + dysrhythmias Atrial Fibrillation  Rhythm:Irregular Rate:Normal     Neuro/Psych   Anxiety Depression    negative neurological ROS     GI/Hepatic negative GI ROS, Neg liver ROS,,,  Endo/Other  diabetes, Type 2, Oral Hypoglycemic Agents    Renal/GU negative Renal ROS  negative genitourinary   Musculoskeletal  (+) Arthritis ,    Abdominal   Peds  Hematology  (+) Blood dyscrasia, anemia   Anesthesia Other Findings   Reproductive/Obstetrics negative OB ROS                             Anesthesia Physical Anesthesia Plan  ASA: 3  Anesthesia Plan: General   Post-op Pain Management: Minimal or no pain anticipated   Induction: Intravenous  PONV Risk Score and Plan: 2 and Propofol infusion  Airway Management Planned: Natural Airway and Simple Face Mask  Additional Equipment:   Intra-op Plan:   Post-operative Plan:   Informed Consent: I have reviewed the patients History and Physical, chart, labs and discussed the procedure including the risks, benefits and alternatives for the proposed anesthesia with the patient or authorized representative who has indicated his/her understanding and acceptance.     Dental advisory given  Plan Discussed with: CRNA  Anesthesia Plan Comments:        Anesthesia Quick Evaluation

## 2023-04-15 NOTE — Plan of Care (Signed)
Patient succesfully cardioverted today. Will continue to monitor patient

## 2023-04-15 NOTE — Telephone Encounter (Signed)
Signed.

## 2023-04-15 NOTE — H&P (View-Only) (Signed)
Patient Name: Eddie Graham Date of Encounter: 04/15/2023 [facility] Cardiologist: Rollene Rotunda, MD   Interval Summary  .    Reports abd pain is gone, feels much better today.   Vital Signs .    Vitals:   04/14/23 1553 04/14/23 2042 04/15/23 0450 04/15/23 0747  BP: 99/78 121/84 103/74 (!) 122/97  Pulse: (!) 101  94 100  Resp: (!) 21 16 20 19   Temp: 97.6 F (36.4 C) 98 F (36.7 C) (!) 97.3 F (36.3 C) 97.8 F (36.6 C)  TempSrc: Oral Oral Oral Oral  SpO2: 99% 97% 95%   Weight:   95.4 kg   Height:        Intake/Output Summary (Last 24 hours) at 04/15/2023 0850 Last data filed at 04/15/2023 4259 Gross per 24 hour  Intake 240 ml  Output 2500 ml  Net -2260 ml      04/15/2023    4:50 AM 04/14/2023    7:43 AM 04/13/2023    3:18 AM  Last 3 Weights  Weight (lbs) 210 lb 6.4 oz 209 lb 12.8 oz 207 lb 9.6 oz  Weight (kg) 95.437 kg 95.165 kg 94.167 kg      Telemetry/ECG    Atrial flutter, rates 80-90s - Personally Reviewed  Physical Exam .   GEN: Chronically on 4L, no distress Neck: No JVD Cardiac: RRR, no murmurs, rubs, or gallops.  Respiratory: Clear to auscultation bilaterally. GI: Soft, nontender, non-distended  MS: No edema  Assessment & Plan .     Eddie Graham is a 88 y.o. male with a hx of chronic heart failure with reduced ejection fraction (EF 40-45% on 01/27/2023), coronary artery disease s/p CABG in 2011, paroxysmal atrial fibrillation/atrial flutter, AAA, sigmoid colon abscess s/p end colostomy, and chronic kidney disease IIIb who was seen for the evaluation of chest pain.   CAD s/p CABG Chest pain -- Patient presented to the ED with acute on chronic chest pain. EKG with chronic RBBB, LAFB, and no acute ischemic changes. Troponin 25->25. Chest pain seems atypical in nature, was tender with palpation, not consistent with ACS.  -- no ASA with need for OAC, continue statin, Toprol XL   Paroxysmal atrial  fibrillation/flutter -- remains in atrial flutter, rates improved to 80-90s -- continue Toprol XL 50mg  BID  -- Continue Eliquis 2.5mg  BID  -- planned for TEE/DCCV yesterday but canceled with concerns of chest pain/abd pain -- rescheduled for today, has been NPO   Acute on chronic HFrEF Acute on chronic hypoxic respiratory failure -- presented with volume overload symptoms with bilateral pleural effusions and mild pulmonary edema on imaging.  -- BNP 602.3. Last echo from October 2024 showed LVEF 40-45% with global hypokinesis. Echo this admission with drop in LVEF to 25-30%, global hypokinesis, moderately reduced RV. 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 Plan for SGTL2i after TEE/DCCV   CKD IIIb -- remains stable around 1.6-1.7 -- follow BMET   Per primary team DM Abd pain- improved after large amount of output from colostomy yesterday BPH COPD    For questions or updates, please contact [facility] Please consult [email or web address] for contact info under        Signed, Laverda Page, NP   ATTENDING ATTESTATION:  After conducting a review of all available clinical information with the care team, interviewing the patient, and performing a physical exam,  I agree with the findings and plan described in this note.   GEN: No acute distress.   HEENT:  MMM, no JVD, no scleral icterus Cardiac: Tachycardic, no murmurs, rubs, or gallops.  Respiratory: Clear to auscultation bilaterally. GI: Soft, nontender, non-distended  MS: No edema; No deformity. Neuro:  Nonfocal  Vasc:  +2 radial pulses  Interviewed patient with help on telephone translator:  Patient feeling much better today, without abdominal/chest discomfort while on scheduled Miralax.  NPO for TEE CV today; discussed with Dr Anne Fu at length.  Continue current therapy and will optimize medical therapy  after TEE CV.    Alverda Skeans, MD Pager [phone]

## 2023-04-15 NOTE — CV Procedure (Signed)
   Transesophageal Echocardiogram  Indications: Atrial fibrillation  Time out performed  During this procedure the patient was administered propofol under anesthesiology supervision to achieve and maintain moderate sedation.  The patient's heart rate, blood pressure, and oxygen saturation are monitored continuously during the procedure.   Findings:  Left Ventricle: EF 25-30% reduced  Mitral Valve: mild MR  Aortic Valve: Trileaflet, mild AI  Tricuspid Valve: Moderate TR  Left Atrium: Normal, no left atrial appendage thrombus  Right Atrium: Normal  Intraatrial septum: Normal  Bubble Contrast Study: not performed  [name], MD      Electrical Cardioversion Procedure Note Eddie Graham [id number] [birth year]  Procedure: Electrical Cardioversion Indications:  Atrial Fibrillation  Time Out: Verified patient identification, verified procedure,medications/allergies/relevent history reviewed, required imaging and test results available.  Performed  Procedure Details  The patient was NPO after midnight. Anesthesia was administered at the beside  by Dr.Fitzgerald with propofol.  Cardioversion was performed with synchronized biphasic defibrillation via AP pads with 200 joules.  1 attempt(s) were performed.  The patient converted to normal sinus rhythm. The patient tolerated the procedure well   IMPRESSION:  Successful cardioversion of atrial fibrillation    [name] 04/15/2023, 12:58 PM

## 2023-04-15 NOTE — Progress Notes (Addendum)
Patient Name: Eddie Graham Date of Encounter: 04/15/2023 [facility] Cardiologist: Rollene Rotunda, MD   Interval Summary  .    Reports abd pain is gone, feels much better today.   Vital Signs .    Vitals:   04/14/23 1553 04/14/23 2042 04/15/23 0450 04/15/23 0747  BP: 99/78 121/84 103/74 (!) 122/97  Pulse: (!) 101  94 100  Resp: (!) 21 16 20 19   Temp: 97.6 F (36.4 C) 98 F (36.7 C) (!) 97.3 F (36.3 C) 97.8 F (36.6 C)  TempSrc: Oral Oral Oral Oral  SpO2: 99% 97% 95%   Weight:   95.4 kg   Height:        Intake/Output Summary (Last 24 hours) at 04/15/2023 0850 Last data filed at 04/15/2023 4259 Gross per 24 hour  Intake 240 ml  Output 2500 ml  Net -2260 ml      04/15/2023    4:50 AM 04/14/2023    7:43 AM 04/13/2023    3:18 AM  Last 3 Weights  Weight (lbs) 210 lb 6.4 oz 209 lb 12.8 oz 207 lb 9.6 oz  Weight (kg) 95.437 kg 95.165 kg 94.167 kg      Telemetry/ECG    Atrial flutter, rates 80-90s - Personally Reviewed  Physical Exam .   GEN: Chronically on 4L, no distress Neck: No JVD Cardiac: RRR, no murmurs, rubs, or gallops.  Respiratory: Clear to auscultation bilaterally. GI: Soft, nontender, non-distended  MS: No edema  Assessment & Plan .     Eddie Graham is a 88 y.o. male with a hx of chronic heart failure with reduced ejection fraction (EF 40-45% on 01/27/2023), coronary artery disease s/p CABG in 2011, paroxysmal atrial fibrillation/atrial flutter, AAA, sigmoid colon abscess s/p end colostomy, and chronic kidney disease IIIb who was seen for the evaluation of chest pain.   CAD s/p CABG Chest pain -- Patient presented to the ED with acute on chronic chest pain. EKG with chronic RBBB, LAFB, and no acute ischemic changes. Troponin 25->25. Chest pain seems atypical in nature, was tender with palpation, not consistent with ACS.  -- no ASA with need for OAC, continue statin, Toprol XL   Paroxysmal atrial  fibrillation/flutter -- remains in atrial flutter, rates improved to 80-90s -- continue Toprol XL 50mg  BID  -- Continue Eliquis 2.5mg  BID  -- planned for TEE/DCCV yesterday but canceled with concerns of chest pain/abd pain -- rescheduled for today, has been NPO   Acute on chronic HFrEF Acute on chronic hypoxic respiratory failure -- presented with volume overload symptoms with bilateral pleural effusions and mild pulmonary edema on imaging.  -- BNP 602.3. Last echo from October 2024 showed LVEF 40-45% with global hypokinesis. Echo this admission with drop in LVEF to 25-30%, global hypokinesis, moderately reduced RV. 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 Plan for SGTL2i after TEE/DCCV   CKD IIIb -- remains stable around 1.6-1.7 -- follow BMET   Per primary team DM Abd pain- improved after large amount of output from colostomy yesterday BPH COPD    For questions or updates, please contact [facility] Please consult [email or web address] for contact info under        Signed, Laverda Page, NP   ATTENDING ATTESTATION:  After conducting a review of all available clinical information with the care team, interviewing the patient, and performing a physical exam,  I agree with the findings and plan described in this note.   GEN: No acute distress.   HEENT:  MMM, no JVD, no scleral icterus Cardiac: Tachycardic, no murmurs, rubs, or gallops.  Respiratory: Clear to auscultation bilaterally. GI: Soft, nontender, non-distended  MS: No edema; No deformity. Neuro:  Nonfocal  Vasc:  +2 radial pulses  Interviewed patient with help on telephone translator:  Patient feeling much better today, without abdominal/chest discomfort while on scheduled Miralax.  NPO for TEE CV today; discussed with Dr Anne Fu at length.  Continue current therapy and will optimize medical therapy  after TEE CV.    Alverda Skeans, MD Pager [phone]

## 2023-04-15 NOTE — Progress Notes (Signed)
Mobility Specialist Progress Note:    04/15/23 1600  Oxygen Therapy  O2 Device Nasal Cannula  O2 Flow Rate (L/min) 4 L/min  Mobility  Activity Ambulated with assistance in hallway  Level of Assistance Contact guard assist, steadying assist  Assistive Device Cane  Distance Ambulated (ft) 200 ft  Activity Response Tolerated well  Mobility Referral Yes  Mobility visit 1 Mobility  Mobility Specialist Start Time (ACUTE ONLY) 1512  Mobility Specialist Stop Time (ACUTE ONLY) 1528  Mobility Specialist Time Calculation (min) (ACUTE ONLY) 16 min   Received pt in bed having no complaints and agreeable to mobility. Pt was asymptomatic throughout ambulation and returned to room w/o fault. Left in bed w/ call bell in reach and all needs met.   [place]'Vante Earlene Plater Mobility Specialist Please contact via Special educational needs teacher or Rehab office at [phone]

## 2023-04-15 NOTE — Transfer of Care (Signed)
Immediate Anesthesia Transfer of Care Note  Patient: Eddie Graham  Procedure(s) Performed: TRANSESOPHAGEAL ECHOCARDIOGRAM CARDIOVERSION  Patient Location: PACU and Cath Lab  Anesthesia Type:General  Level of Consciousness: awake and drowsy  Airway & Oxygen Therapy: Patient Spontanous Breathing and Patient connected to face mask oxygen  Post-op Assessment: Report given to RN and Post -op Vital signs reviewed and stable  Post vital signs: Reviewed and stable  Last Vitals:  Vitals Value Taken Time  BP    Temp    Pulse    Resp    SpO2      Last Pain:  Vitals:   04/15/23 1137  TempSrc:   PainSc: 0-No pain      Patients Stated Pain Goal: 2 (04/14/23 0900)  Complications: No notable events documented.

## 2023-04-15 NOTE — Progress Notes (Signed)
OT Cancellation Note  Patient Details Name: Eddie Graham MRN: [id number] DOB: 02-May-1934   Cancelled Treatment:    Reason Eval/Treat Not Completed: Patient at procedure or test/ unavailable Off unit for TEE/CV. Will follow up for OT session as schedule permits.   Lorre Munroe 04/15/2023, 11:39 AM

## 2023-04-15 NOTE — Interval H&P Note (Signed)
History and Physical Interval Note:  04/15/2023 11:26 AM  Eddie Graham  has presented today for surgery, with the diagnosis of afib.  The various methods of treatment have been discussed with the patient and family. After consideration of risks, benefits and other options for treatment, the patient has consented to  Procedure(s): TRANSESOPHAGEAL ECHOCARDIOGRAM (N/A) CARDIOVERSION (N/A) as a surgical intervention.  The patient's history has been reviewed, patient examined, no change in status, stable for surgery.  I have reviewed the patient's chart and labs.  Questions were answered to the patient's satisfaction.     [employer]

## 2023-04-15 NOTE — Progress Notes (Signed)
PROGRESS NOTE  Eddie Graham ZOX:[id number] DOB: 1934/10/13 DOA: 04/10/2023 PCP: Andreas Blower., MD   LOS: 5 days   Brief Narrative / Interim history: Patient is an 88 year old male with past medical history significant for heart failure with reduced EF (EF of 40 to 45%), s/p CABG, bladder cancer status post TURP, sigmoid colon abscess s/p end colostomy, hypertension, hyperlipidemia, paroxysmal atrial fibrillation on Eliquis, anemia, AAA and chronic kidney disease stage IIIb. Patient was admitted with chest pain and shortness of breath. Troponin on presentation was 25. Cardiology team is directing care. For TEE/DCCV once euvolemic. Plan is to continue rate control and Eliquis for now. Adequate diuresis. Chest pain is considered atypical. Patient is a poor historian   Subjective / 24h Interval events: Abdominal pain is better, denies any chest pain, does complain of throat pain when he breathes.  Assesement and Plan: Principal problem Acute on chronic systolic CHF-repeat 2D echo shows an EF of 25-30%.  Prior echo-EF of 40-45%.  BNP was elevated on admission, had bilateral pleural effusions and mild pulmonary edema.  Received IV Lasix, currently net -5.5 L -further diuresis per cardiology  Active problems PAF flutter, on Eliquis-with RVR, tachycardic at times.  Continue anticoagulation with Eliquis, metoprolol. -TEE cardioversion was canceled yesterday due to the patient reporting active chest pain.  Rescheduled for today.  Remains in a flutter  Chest pain-atypical, flat troponins  Acute on chronic hypoxic respiratory failure secondary to bilateral pleural effusions and mild pulmonary edema -At baseline, patient is on 4 L nasal cannula.  Continue oxygen, appears at baseline, no wheezing   CKD 3B, at baseline - Baseline creatinine 1.6-1.9.  Stable today   Type 2 diabetes with hyperglycemia - Last hemoglobin A1c 7.4 on 01/20/2023. Continue sliding scale insulin coverage. Heart  healthy, carb modified diet.  CBG (last 3)  Recent Labs    04/14/23 1652 04/14/23 2129 04/15/23 0749  GLUCAP 198* 140* 157*     Coronary artery disease status post CABG -noted, continue home regimen  Hypertension -continue metoprolol, blood pressure controlled  Hyperlipidemia -continue statin   Chronic anxiety/depression- Resume home Cymbalta   BPH - Resume home Flomax   COPD - Resume home bronchodilators. Maintain O2 saturation above 90%   Obesity - BMI 33   Physical debility - PT OT assessment   Infrarenal AAA - Outpatient follow-up   Vague abdominal pain -continue bowel regimen, abdominal pain improving as he has been having more bowel movements   History of sigmoid abscess status post end colostomy - Colostomy care    Scheduled Meds:  apixaban  2.5 mg Oral BID   DULoxetine  60 mg Oral Daily   furosemide  20 mg Oral Daily   Glycerin (Adult)  1 suppository Rectal Once   insulin aspart  0-5 Units Subcutaneous QHS   insulin aspart  0-9 Units Subcutaneous TID WC   metoprolol succinate  50 mg Oral BID   montelukast  10 mg Oral QHS   pantoprazole  40 mg Oral BID   pneumococcal 20-valent conjugate vaccine  0.5 mL Intramuscular Tomorrow-1000   polyethylene glycol  17 g Oral BID   rosuvastatin  40 mg Oral QHS   senna-docusate  2 tablet Oral BID   tamsulosin  0.4 mg Oral Daily   traZODone  50 mg Oral QHS   umeclidinium-vilanterol  1 puff Inhalation Daily   Continuous Infusions:   PRN Meds:.acetaminophen, albuterol, alum & mag hydroxide-simeth, gabapentin, melatonin, menthol-cetylpyridinium, phenol, prochlorperazine, sodium chloride  Current Outpatient Medications  Medication Instructions   acetaminophen (TYLENOL) 650 mg, Every 8 hours PRN   albuterol (VENTOLIN HFA) 108 (90 Base) MCG/ACT inhaler 2 puffs, Every 4 hours PRN   ANORO ELLIPTA 62.5-25 MCG/ACT AEPB 1 puff, Daily   apixaban (ELIQUIS) 2.5 mg, Oral, 2 times daily   Carboxymethylcellulose Sodium (EYE  DROPS OP) 2 drops, As needed   DULoxetine (CYMBALTA) 60 mg, Daily   ferrous sulfate 325 mg, Daily with breakfast   furosemide (LASIX) 20 mg, Oral, Daily   gabapentin (NEURONTIN) 100-300 mg, At bedtime PRN   Insulin Pen Needle (TECHLITE PEN NEEDLES) 32G X 4 MM MISC Use to inject Lantus at bedtime as directed   Jardiance 10 mg, Oral, Daily before breakfast   Lantus SoloStar 15 Units, Subcutaneous, Daily at bedtime   Melatonin 5 mg, At bedtime PRN   metoprolol succinate (TOPROL-XL) 100 mg, Oral, Daily, Take with or immediately following a meal.   montelukast (SINGULAIR) 10 mg, Daily at bedtime   pantoprazole (PROTONIX) 40 MG tablet Take 1 tablet by mouth 2 times daily.   polyethylene glycol (MIRALAX / GLYCOLAX) 17 g, Daily PRN   rosuvastatin (CRESTOR) 40 mg, Nightly   Semaglutide 14 mg, Daily   traMADol (ULTRAM) 50 mg, Oral, Every 6 hours PRN   traZODone (DESYREL) 50 mg, Daily at bedtime    Diet Orders (From admission, onward)     Start     Ordered   04/15/23 0001  Diet NPO time specified  Diet effective midnight        04/14/23 1442            DVT prophylaxis: apixaban (ELIQUIS) tablet 2.5 mg Start: 04/10/23 2200 apixaban (ELIQUIS) tablet 2.5 mg   Lab Results  Component Value Date   PLT 186 04/14/2023      Code Status: Full Code  Family Communication: Updated daughter over the phone  Status is: Inpatient Remains inpatient appropriate because: severity of ilness  Level of care: Telemetry Cardiac  Consultants:  Cardiology   Objective: Vitals:   04/14/23 1553 04/14/23 2042 04/15/23 0450 04/15/23 0747  BP: 99/78 121/84 103/74 (!) 122/97  Pulse: (!) 101  94 100  Resp: (!) 21 16 20 19   Temp: 97.6 F (36.4 C) 98 F (36.7 C) (!) 97.3 F (36.3 C) 97.8 F (36.6 C)  TempSrc: Oral Oral Oral Oral  SpO2: 99% 97% 95%   Weight:   95.4 kg   Height:        Intake/Output Summary (Last 24 hours) at 04/15/2023 1121 Last data filed at 04/15/2023 0714 Gross per 24 hour   Intake 240 ml  Output 2500 ml  Net -2260 ml   Wt Readings from Last 3 Encounters:  04/15/23 95.4 kg  03/02/23 96.6 kg  03/01/23 96.2 kg    Examination:  Constitutional: NAD Eyes: lids and conjunctivae normal, no scleral icterus ENMT: mmm Neck: normal, supple Respiratory: clear to auscultation bilaterally, no wheezing, no crackles. Normal respiratory effort.  Cardiovascular: Regular rate and rhythm, no murmurs / rubs / gallops. No LE edema. Abdomen: soft, no distention, no tenderness. Bowel sounds positive.  Skin: no rashes  Data Reviewed: I have independently reviewed following labs and imaging studies   CBC Recent Labs  Lab 04/10/23 1326 04/11/23 0506 04/12/23 0853 04/13/23 0344 04/14/23 0402  WBC 8.8 7.6 8.5 8.1 6.6  HGB 14.7 14.3 15.0 15.1 13.8  HCT 47.2 44.8 45.8 47.6 43.5  PLT 241 208 216 196 186  MCV 90.1 89.6 88.4 89.1 88.8  MCH 28.1 28.6 29.0 28.3 28.2  MCHC 31.1 31.9 32.8 31.7 31.7  RDW 15.3 15.2 15.2 15.1 14.8    Recent Labs  Lab 04/10/23 1326 04/10/23 1338 04/11/23 0506 04/12/23 0853 04/13/23 0344 04/14/23 0402 04/15/23 0359  NA 139  --  139 137 137 137 137  K 3.9  --  3.9 3.6 3.6 3.8 4.4  CL 106  --  104 99 98 99 100  CO2 23  --  25 26 28 28 27   GLUCOSE 143*  --  138* 147* 139* 215* 156*  BUN 19  --  22 28* 31* 36* 30*  CREATININE 1.74*  --  1.65* 1.73* 1.73* 1.91* 1.74*  CALCIUM 8.7*  --  8.6* 8.6* 8.4* 8.3* 8.8*  AST 19  --   --   --  18 16 17   ALT 18  --   --   --  17 16 18   ALKPHOS 80  --   --   --  67 54 62  BILITOT 1.8*  --   --   --  1.5* 1.1 1.1  ALBUMIN 3.1*  --   --   --  2.9* 2.6* 3.0*  MG  --   --  2.0 2.1 2.5* 2.3  --   LATICACIDVEN  --  1.7  --   --   --   --   --   TSH  --   --   --   --   --  1.351  --   BNP 602.3*  --   --   --   --   --   --     ------------------------------------------------------------------------------------------------------------------ No results for input(s): "CHOL", "HDL", "LDLCALC", "TRIG",  "CHOLHDL", "LDLDIRECT" in the last 72 hours.  Lab Results  Component Value Date   HGBA1C 7.4 (H) 01/20/2023   ------------------------------------------------------------------------------------------------------------------ Recent Labs    04/14/23 0402  TSH 1.351    Cardiac Enzymes No results for input(s): "CKMB", "TROPONINI", "MYOGLOBIN" in the last 168 hours.  Invalid input(s): "CK" ------------------------------------------------------------------------------------------------------------------    Component Value Date/Time   BNP 602.3 (H) 04/10/2023 1326    CBG: Recent Labs  Lab 04/14/23 0738 04/14/23 1211 04/14/23 1652 04/14/23 2129 04/15/23 0749  GLUCAP 148* 144* 198* 140* 157*    Recent Results (from the past 240 hours)  Resp panel by RT-PCR (RSV, Flu A&B, Covid) Anterior Nasal Swab     Status: None   Collection Time: 04/10/23  1:26 PM   Specimen: Anterior Nasal Swab  Result Value Ref Range Status   SARS Coronavirus 2 by RT PCR NEGATIVE NEGATIVE Final   Influenza A by PCR NEGATIVE NEGATIVE Final   Influenza B by PCR NEGATIVE NEGATIVE Final    Comment: (NOTE) The Xpert Xpress SARS-CoV-2/FLU/RSV plus assay is intended as an aid in the diagnosis of influenza from Nasopharyngeal swab specimens and should not be used as a sole basis for treatment. Nasal washings and aspirates are unacceptable for Xpert Xpress SARS-CoV-2/FLU/RSV testing.  Fact Sheet for Patients: BloggerCourse.com  Fact Sheet for Healthcare Providers: SeriousBroker.it  This test is not yet approved or cleared by the [place] FDA and has been authorized for detection and/or diagnosis of SARS-CoV-2 by FDA under an Emergency Use Authorization (EUA). This EUA will remain in effect (meaning this test can be used) for the duration of the COVID-19 declaration under Section 564(b)(1) of the Act, 21 U.S.C. section 360bbb-3(b)(1), unless the  authorization is terminated or revoked.     Resp Syncytial  Virus by PCR NEGATIVE NEGATIVE Final    Comment: (NOTE) Fact Sheet for Patients: BloggerCourse.com  Fact Sheet for Healthcare Providers: SeriousBroker.it  This test is not yet approved or cleared by the [place] FDA and has been authorized for detection and/or diagnosis of SARS-CoV-2 by FDA under an Emergency Use Authorization (EUA). This EUA will remain in effect (meaning this test can be used) for the duration of the COVID-19 declaration under Section 564(b)(1) of the Act, 21 U.S.C. section 360bbb-3(b)(1), unless the authorization is terminated or revoked.  Performed at [facility], [address] [place], [place] [address]   Blood culture (routine x 2)     Status: None   Collection Time: 04/10/23  1:29 PM   Specimen: BLOOD  Result Value Ref Range Status   Specimen Description BLOOD RIGHT ANTECUBITAL  Final   Special Requests   Final    BOTTLES DRAWN AEROBIC AND ANAEROBIC Blood Culture results may not be optimal due to an inadequate volume of blood received in culture bottles   Culture   Final    NO GROWTH 5 DAYS Performed at [facility], [address] [place], [place] [address]    Report Status 04/15/2023 FINAL  Final  Blood culture (routine x 2)     Status: None   Collection Time: 04/10/23  1:34 PM   Specimen: BLOOD LEFT WRIST  Result Value Ref Range Status   Specimen Description BLOOD LEFT WRIST  Final   Special Requests   Final    BOTTLES DRAWN AEROBIC AND ANAEROBIC Blood Culture adequate volume   Culture   Final    NO GROWTH 5 DAYS Performed at [facility], [address], [place], [place] [address]    Report Status 04/15/2023 FINAL  Final     Radiology Studies: No results found.    Pamella Pert, MD, PhD [facility]  Between 7 am - 7 pm I am available, please contact me via Amion (for emergencies) or  Securechat (non urgent messages)  Between 7 pm - 7 am I am not available, please contact night coverage MD/APP via Amion

## 2023-04-15 NOTE — Plan of Care (Signed)
Received report from previous RN that patient had a couple of large outputs from colostomy and significant amount of air expelled from bag. Patient reports feeling much better stating no pain. Needing reminders, but NPO for possible cardioversion in AM.  Problem: Education: Goal: Ability to describe self-care measures that may prevent or decrease complications (Diabetes Survival Skills Education) will improve Outcome: Progressing Goal: Individualized Educational Video(s) Outcome: Progressing   Problem: Coping: Goal: Ability to adjust to condition or change in health will improve Outcome: Progressing   Problem: Fluid Volume: Goal: Ability to maintain a balanced intake and output will improve Outcome: Progressing   Problem: Health Behavior/Discharge Planning: Goal: Ability to identify and utilize available resources and services will improve Outcome: Progressing Goal: Ability to manage health-related needs will improve Outcome: Progressing   Problem: Metabolic: Goal: Ability to maintain appropriate glucose levels will improve Outcome: Progressing   Problem: Nutritional: Goal: Maintenance of adequate nutrition will improve Outcome: Progressing Goal: Progress toward achieving an optimal weight will improve Outcome: Progressing   Problem: Skin Integrity: Goal: Risk for impaired skin integrity will decrease Outcome: Progressing   Problem: Tissue Perfusion: Goal: Adequacy of tissue perfusion will improve Outcome: Progressing   Problem: Education: Goal: Knowledge of General Education information will improve Description: Including pain rating scale, medication(s)/side effects and non-pharmacologic comfort measures Outcome: Progressing   Problem: Health Behavior/Discharge Planning: Goal: Ability to manage health-related needs will improve Outcome: Progressing   Problem: Clinical Measurements: Goal: Ability to maintain clinical measurements within normal limits will  improve Outcome: Progressing Goal: Will remain free from infection Outcome: Progressing Goal: Diagnostic test results will improve Outcome: Progressing Goal: Respiratory complications will improve Outcome: Progressing Goal: Cardiovascular complication will be avoided Outcome: Progressing   Problem: Activity: Goal: Risk for activity intolerance will decrease Outcome: Progressing   Problem: Nutrition: Goal: Adequate nutrition will be maintained Outcome: Progressing   Problem: Coping: Goal: Level of anxiety will decrease Outcome: Progressing   Problem: Elimination: Goal: Will not experience complications related to bowel motility Outcome: Progressing Goal: Will not experience complications related to urinary retention Outcome: Progressing   Problem: Pain Management: Goal: General experience of comfort will improve Outcome: Progressing   Problem: Safety: Goal: Ability to remain free from injury will improve Outcome: Progressing   Problem: Skin Integrity: Goal: Risk for impaired skin integrity will decrease Outcome: Progressing   Problem: Education: Goal: Knowledge of disease or condition will improve Outcome: Progressing Goal: Understanding of medication regimen will improve Outcome: Progressing Goal: Individualized Educational Video(s) Outcome: Progressing   Problem: Activity: Goal: Ability to tolerate increased activity will improve Outcome: Progressing   Problem: Cardiac: Goal: Ability to achieve and maintain adequate cardiopulmonary perfusion will improve Outcome: Progressing   Problem: Health Behavior/Discharge Planning: Goal: Ability to safely manage health-related needs after discharge will improve Outcome: Progressing

## 2023-04-15 NOTE — Care Management (Addendum)
Food resources added to AVS resumption orders for Home health. [facility] notified of possible DC today, successful cardioversion.

## 2023-04-15 NOTE — Discharge Instructions (Signed)
 Food Pantries A SHARED BLESSING FOOD PANTRY Provided by A SHARED BLESSING FOOD PANTRY [address], [place], [place] 2 [employer]OD [employer]AP[employer] Provided by [employer] FOOD PANTRY [address] [place], [place] 3 FREE INDEED FOOD PANTRY Provided by FREE INDEED OUTREACH MINISTRY [address], [place], [place] [address] [employer] FOOD PANTRY Provided by [employer] FOOD PANTRY [address], [place], [place] 5 [place] [employer] FOOD PANTRY Provided by [place] [employer]IST [employer] FOOD PANTRY [address] [place], [place] 6 BREAD OF LIFE FOOD BANK Provided by [facility] [employer] FOOD BANK [address] [place], [place] 7 [employer] PRESBYTERIAN [employer] FOOD PANTRY Provided by [employer] FOOD PANTRY [address] [place], [place] 8 JULIAN [facility] FOOD PANTRY Provided by [employer] FOOD PANTRY [address] [place], [place], [place] 9 [place]. MATTHEWS [facility] FOOD PANTRY Provided by ST. [place] [employer] FOOD PANTRY [address] [place], [place] 10 [facility] FOOD PANTRY Provided by [employer] [facility] FOOD PANTRY [address], [place], [place]  FOOD PANTRY Provided by [employer] [address] [place], [place] 12 FIVE LOAVES TWO FISH FOOD PANTRY Provided by FIVE LOAVES TWO FISH FOOD PANTRY [address], [place], [place] 13 [place] [employer] GOSPEL MINISTRIES Provided by [employer] [place] [employer]OS[employer] MINISTRIES [address] [place], [place] 14 FOOD ASSISTANCE Provided by [employer] [place] [address] [place], [place] 15 FOOD PANTRY Provided by [employer] - [place] [address], [place], [place] 16 MOBILE MARKET Provided by [employer] HEALTH [address] [place], [place] 20 FOOD PANTRY Provided by [employer] [address], [place], [place] 25 FOOD PANTRY Provided by [employer] [place] [employer] DOOR MINISTRIES - [place] [address] [place], [place] 42 FOOD PANTRY Provided by [employer] MINISTRY [address] [place], [place], [place] 20 EMERGENCY FOOD PANTRY Provided by [employer] - [place] [address], [place], [place]  FRESH [employer] Provided by OUT OF THE GARDEN PROJECT [address] [place], [place], [place] 22 FEED ONE SAVE ONE Provided by THE APPLE OF HIS EYE APOSTOLIC MINISTRIES [address], [place], [place] 23 FOOD ASSISTANCE Provided by [employer] [place] [address], [place], [place] 24 COMMUNITY SUPPORT AND NUTRITION PROGRAM Provided by ONE STEP FURTHER [address], [place], [place] 70 COMMUNITY SUPPORT AND NUTRITION PROGRAM Provided by ONE STEP FURTHER [address] [place], [place] 26 FAMILY MARKET Provided by [employer] [address] [place], [place] 62 BREAKTHROUGH COMMUNITY [facility]URCH FOOD PANTRY Provided by [employer] FOOD PANTRY [address], [place], [place] 28 CHRISTIAN ASSEMBLY EMERGENCY FOOD PANTRY Provided by [employer] EMERGENCY FOOD PANTRY [address] [place], [place] 37 COMMUNITY FOOD DISTRIBUTION Provided by [facility] [place] [address], [place], [place] 30 [employer] COMMUNITY FOOD PANTRY Provided by [employer] FOOD PANTRY [address] [place], [place]

## 2023-04-16 ENCOUNTER — Encounter (HOSPITAL_COMMUNITY): Payer: Self-pay | Admitting: Cardiology

## 2023-04-16 ENCOUNTER — Inpatient Hospital Stay (HOSPITAL_COMMUNITY): Payer: 59

## 2023-04-16 DIAGNOSIS — I4892 Unspecified atrial flutter: Secondary | ICD-10-CM | POA: Diagnosis not present

## 2023-04-16 LAB — COMPREHENSIVE METABOLIC PANEL
ALT: 18 U/L (ref 0–44)
AST: 15 U/L (ref 15–41)
Albumin: 3 g/dL — ABNORMAL LOW (ref 3.5–5.0)
Alkaline Phosphatase: 62 U/L (ref 38–126)
Anion gap: 8 (ref 5–15)
BUN: 31 mg/dL — ABNORMAL HIGH (ref 8–23)
CO2: 26 mmol/L (ref 22–32)
Calcium: 8.4 mg/dL — ABNORMAL LOW (ref 8.9–10.3)
Chloride: 100 mmol/L (ref 98–111)
Creatinine, Ser: 1.78 mg/dL — ABNORMAL HIGH (ref 0.61–1.24)
GFR, Estimated: 37 mL/min — ABNORMAL LOW (ref >60)
Glucose, Bld: 197 mg/dL — ABNORMAL HIGH (ref 70–99)
Potassium: 4.2 mmol/L (ref 3.5–5.1)
Sodium: 134 mmol/L — ABNORMAL LOW (ref 135–145)
Total Bilirubin: 1 mg/dL (ref <1.2)
Total Protein: 5.7 g/dL — ABNORMAL LOW (ref 6.5–8.1)

## 2023-04-16 LAB — CBC
HCT: 41.9 % (ref 39.0–52.0)
Hemoglobin: 13.3 g/dL (ref 13.0–17.0)
MCH: 28.5 pg (ref 26.0–34.0)
MCHC: 31.7 g/dL (ref 30.0–36.0)
MCV: 89.7 fL (ref 80.0–100.0)
Platelets: 187 10*3/uL (ref 150–400)
RBC: 4.67 MIL/uL (ref 4.22–5.81)
RDW: 15.2 % (ref 11.5–15.5)
WBC: 6.6 10*3/uL (ref 4.0–10.5)
nRBC: 0 % (ref 0.0–0.2)

## 2023-04-16 LAB — GLUCOSE, CAPILLARY
Glucose-Capillary: 172 mg/dL — ABNORMAL HIGH (ref 70–99)
Glucose-Capillary: 213 mg/dL — ABNORMAL HIGH (ref 70–99)
Glucose-Capillary: 111 mg/dL — ABNORMAL HIGH (ref 70–99)
Glucose-Capillary: 240 mg/dL — ABNORMAL HIGH (ref 70–99)

## 2023-04-16 LAB — MAGNESIUM: Magnesium: 2.4 mg/dL (ref 1.7–2.4)

## 2023-04-16 MED ORDER — MENTHOL 3 MG MT LOZG
1.0000 | LOZENGE | Freq: Three times a day (TID) | OROMUCOSAL | Status: DC
  Administered 2023-04-16 – 2023-04-17 (×4): 3 mg via ORAL
  Filled 2023-04-16: qty 9

## 2023-04-16 MED ORDER — METOPROLOL SUCCINATE ER 100 MG PO TB24
100.0000 mg | ORAL_TABLET | Freq: Every day | ORAL | Status: DC
  Administered 2023-04-16 – 2023-04-17 (×2): 100 mg via ORAL
  Filled 2023-04-16 (×2): qty 1

## 2023-04-16 MED ORDER — EMPAGLIFLOZIN 10 MG PO TABS
10.0000 mg | ORAL_TABLET | Freq: Every day | ORAL | Status: DC
  Administered 2023-04-16 – 2023-04-17 (×2): 10 mg via ORAL
  Filled 2023-04-16 (×2): qty 1

## 2023-04-16 NOTE — Care Management Important Message (Signed)
Important Message  Patient Details  Name: Eddie Graham MRN: [id number] Date of Birth: 04-19-1935   Important Message Given:  Yes - Medicare IM     Renie Ora 04/16/2023, 10:42 AM

## 2023-04-16 NOTE — Progress Notes (Addendum)
Patient Name: Eddie Graham Date of Encounter: 04/16/2023 [facility] Cardiologist: Rollene Rotunda, MD   Interval Summary  .    Breathing slightly better, no more abd pain.   Vital Signs .    Vitals:   04/16/23 0312 04/16/23 0354 04/16/23 0604 04/16/23 0741  BP:  129/79    Pulse:  71    Resp: (!) 21 (!) 22 14 (!) 22  Temp:  (!) 97.4 F (36.3 C)  97.8 F (36.6 C)  TempSrc:  Oral  Oral  SpO2: 95% 98%    Weight:   95.8 kg   Height:        Intake/Output Summary (Last 24 hours) at 04/16/2023 0817 Last data filed at 04/16/2023 0604 Gross per 24 hour  Intake 920 ml  Output 1700 ml  Net -780 ml      04/16/2023    6:04 AM 04/15/2023    4:50 AM 04/14/2023    7:43 AM  Last 3 Weights  Weight (lbs) 211 lb 4.8 oz 210 lb 6.4 oz 209 lb 12.8 oz  Weight (kg) 95.845 kg 95.437 kg 95.165 kg      Telemetry/ECG    Sinus Rhythm - Personally Reviewed  Physical Exam .   GEN: No acute distress, remains on 4L [place]ck: No JVD Cardiac: RRR, no murmurs, rubs, or gallops.  Respiratory: Clear to auscultation bilaterally. GI: Soft, nontender, non-distended  MS: No edema  Assessment & Plan .     Algert Ryland is a 88 y.o. male with a hx of chronic heart failure with reduced ejection fraction (EF 40-45% on 01/27/2023), coronary artery disease s/p CABG in 2011, paroxysmal atrial fibrillation/atrial flutter, AAA, sigmoid colon abscess s/p end colostomy, and chronic kidney disease IIIb who was seen for the evaluation of chest pain.   CAD s/p CABG Chest pain -- Patient presented to the ED with acute on chronic chest pain. EKG with chronic RBBB, LAFB, and no acute ischemic changes. Troponin 25->25. Chest pain seems atypical in nature, was tender with palpation, not consistent with ACS.  -- no ASA with need for [facility], continue statin, Toprol XL   Paroxysmal atrial fibrillation/flutter -- successful TEE/DCCV 12/19 -- continue Toprol XL 50mg  BID, consolidate to  100mg  daily   -- Continue Eliquis 2.5mg  BID   Acute on chronic HFrEF Acute on chronic hypoxic respiratory failure -- presented with volume overload symptoms with bilateral pleural effusions and mild pulmonary edema on imaging.  -- BNP 602.3. Last echo from October 2024 showed LVEF 40-45% with global hypokinesis. Echo this admission with drop in LVEF to 25-30%, global hypokinesis, moderately reduced RV. 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 Resume jardiance today   CKD IIIb -- remains stable around 1.6-1.7 -- follow BMET   Per primary team DM Abd pain- improved after large amount of output from colostomy yesterday BPH COPD HLD    For questions or updates, please contact  [facility] Please consult [email or web address] for contact info under        Signed, Laverda Page, NP    ATTENDING ATTESTATION:  After conducting a review of all available clinical information with the care team, interviewing the patient, and performing a physical exam, I agree with the findings and plan described in this note.   GEN: No acute distress.   HEENT:  MMM, no JVD, no scleral icterus Cardiac: RRR,  no murmurs, rubs, or gallops.  Respiratory: Clear to auscultation bilaterally. GI: Soft, nontender, non-distended; colostomy bag in place MS: No edema; No deformity. Neuro:  Nonfocal  Vasc:  +2 radial pulses   Interviewed patient with help on telephone translator:   Patient s/p TEE CV to NSR yesterday.  Reports dyspnea.  When queried further, the patient points to this throat.  I asked him if he had a sore throat and difficulty swallowing (which he affirmed).  He tells me this has been a long standing issue.  Will try cepacol lozenge.  Patient denies chest or abdominal pain.  OTW will continue current therapy.   Alverda Skeans, MD Pager [phone]

## 2023-04-16 NOTE — Progress Notes (Signed)
Occupational Therapy Treatment Patient Details Name: Eddie Graham MRN: [id number] DOB: 14-Jul-1934 Today's Date: 04/16/2023   History of present illness 88 y.o. male admitted 04/10/23 with chest pain, SOB; no evidence of acute ischemia. Workup for acute on chronic hypoxic respiratory failure, CHF, bilateral pleural effusions. Plan for TEE/DCCV. PMH includes CAD, COPD, HTN, AAA, CKD, DM, neuropathy, afib/flutter, colostomy, depression, back pain.   OT comments  Pt requesting toileting assist on OT entry. Pt preferred to use BSC rather than bathroom; able to complete BSC transfer using cane, toileting task and bathing due to colostomy bag leakage with assist only for lines. Educated pt on currently using 4 L O2 and importance of awareness of how much O2 pt is discharged home with as he reported unsure how much he was using at home.       If plan is discharge home, recommend the following:  A little help with bathing/dressing/bathroom;Assistance with cooking/housework;Direct supervision/assist for medications management   Equipment Recommendations  Tub/shower seat    Recommendations for Other Services      Precautions / Restrictions Precautions Precautions: Fall;Other (comment) Precaution Comments: colostomy; watch SpO2 (wears supplemental O2 PRN at home) Restrictions Weight Bearing Restrictions Per Provider Order: No       Mobility Bed Mobility Overal bed mobility: Modified Independent Bed Mobility: Supine to Sit, Sit to Supine                Transfers Overall transfer level: Needs assistance Equipment used: Straight cane Transfers: Sit to/from Stand, Bed to chair/wheelchair/BSC Sit to Stand: Supervision     Step pivot transfers: Supervision     General transfer comment: to/from [facility] offered mobility to toilet though pt reported preference for [facility] use     Balance Overall balance assessment: Needs assistance Sitting-balance support: No upper extremity  supported, Feet supported Sitting balance-Leahy Scale: Good     Standing balance support: No upper extremity supported, During functional activity, Single extremity supported Standing balance-Leahy Scale: Fair                             ADL either performed or assessed with clinical judgement   ADL Overall ADL's : Needs assistance/impaired                 Upper Body Dressing : Set up;Sitting       Toilet Transfer: Civil engineer, contracting Details (indicate cue type and reason): with use of cane Toileting- Clothing Manipulation and Hygiene: Supervision/safety;Sit to/from stand;Sitting/lateral lean Toileting - Clothing Manipulation Details (indicate cue type and reason): able to perform anterior peri care, noted stool on gown/self from colostomy w/ pt able to assist in clean up from this as well- indicating need to empty it            Extremity/Trunk Assessment Upper Extremity Assessment Upper Extremity Assessment: Overall WFL for tasks assessed;Right hand dominant   Lower Extremity Assessment Lower Extremity Assessment: Defer to PT evaluation        Vision   Vision Assessment?: Wears glasses for reading   Perception     Praxis      Cognition Arousal: Alert Behavior During Therapy: WFL for tasks assessed/performed, Restless, Impulsive Overall Cognitive Status: No family/caregiver present to determine baseline cognitive functioning                                 General Comments: cognition improving, following  one step commands well and showing some anticipatory awareness. still impulsive at times. question health literacy deficits        Exercises      Shoulder Instructions       General Comments SpO2 WFL on 4 LO2. educated pt on currently using 4 L O2 and monitor if this would be what pt was discharged home on    Pertinent Vitals/ Pain       Pain Assessment Pain Assessment: No/denies  pain  Home Living                                          Prior Functioning/Environment              Frequency  Min 1X/week        Progress Toward Goals  OT Goals(current goals can now be found in the care plan section)  Progress towards OT goals: Progressing toward goals  Acute Rehab OT Goals Patient Stated Goal: use BSC, go home soon OT Goal Formulation: With patient Time For Goal Achievement: 04/26/23 Potential to Achieve Goals: Good ADL Goals Pt Will Perform Lower Body Dressing: with modified independence;sit to/from stand;with adaptive equipment Pt Will Perform Tub/Shower Transfer: Tub transfer;ambulating Additional ADL Goal #1: Pt to verbalize at least 3 energy conservation strategies to implement at home.  Plan      Co-evaluation                 AM-PAC OT "6 Clicks" Daily Activity     Outcome Measure   Help from another person eating meals?: None Help from another person taking care of personal grooming?: A Little Help from another person toileting, which includes using toliet, bedpan, or urinal?: A Little Help from another person bathing (including washing, rinsing, drying)?: A Little Help from another person to put on and taking off regular upper body clothing?: None Help from another person to put on and taking off regular lower body clothing?: A Little 6 Click Score: 20    End of Session Equipment Utilized During Treatment: Oxygen  OT Visit Diagnosis: Unsteadiness on feet (R26.81);Other abnormalities of gait and mobility (R26.89);Muscle weakness (generalized) (M62.81)   Activity Tolerance Patient tolerated treatment well   Patient Left in bed;with call bell/phone within reach;with bed alarm set   Nurse Communication Other (comment) (colostomy bag full)        Time: 6213-0865 OT Time Calculation (min): 17 min  Charges: OT General Charges $OT Visit: 1 Visit OT Treatments $Self Care/Home Management : 8-22  mins  Bradd Canary, OTR/L Acute Rehab Services Office: [phone]   Lorre Munroe 04/16/2023, 12:41 PM

## 2023-04-16 NOTE — Progress Notes (Signed)
PROGRESS NOTE  Eddie Graham ZOX:[id number] DOB: 06-24-1934 DOA: 04/10/2023 PCP: Andreas Blower., MD   LOS: 6 days   Brief Narrative / Interim history: Patient is an 88 year old male with past medical history significant for heart failure with reduced EF (EF of 40 to 45%), s/p CABG, bladder cancer status post TURP, sigmoid colon abscess s/p end colostomy, hypertension, hyperlipidemia, paroxysmal atrial fibrillation on Eliquis, anemia, AAA and chronic kidney disease stage IIIb. Patient was admitted with chest pain and shortness of breath. Troponin on presentation was 25. Cardiology team is directing care. For TEE/DCCV once euvolemic. Plan is to continue rate control and Eliquis for now. Adequate diuresis. Chest pain is considered atypical. Patient is a poor historian   Subjective / 24h Interval events: No chest or abdominal pain today.  Reports difficulties breathing and points to his throat, feels like he can't get good breathing through his airways  Assesement and Plan: Principal problem Acute on chronic systolic CHF-repeat 2D echo shows an EF of 25-30%.  Prior echo-EF of 40-45%.  BNP was elevated on admission, had bilateral pleural effusions and mild pulmonary edema.  Received IV Lasix, currently net - 6.5 L -further diuresis per cardiology, appears euvolemic today  Active problems PAF flutter, on Eliquis-with RVR, tachycardic at times.  Continue anticoagulation with Eliquis, metoprolol. -Status post TEE/cardioversion on 12/19.  Remains in sinus this morning  Chest pain-atypical, flat troponins  Acute on chronic hypoxic respiratory failure secondary to bilateral pleural effusions and mild pulmonary edema -At baseline, patient is on 4 L nasal cannula.  Continue oxygen, appears at baseline, no wheezing -Given significant throat discomfort with breathing, will get a CT scan of the neck   CKD 3B, at baseline - Baseline creatinine 1.6-1.9.  Stable today   Type 2 diabetes with  hyperglycemia - Last hemoglobin A1c 7.4 on 01/20/2023. Continue sliding scale insulin coverage. Heart healthy, carb modified diet.  CBG (last 3)  Recent Labs    04/15/23 1625 04/15/23 2106 04/16/23 0810  GLUCAP 199* 161* 172*     Coronary artery disease status post CABG -noted, continue home regimen  Hypertension -continue metoprolol, blood pressure controlled  Hyperlipidemia -continue statin   Chronic anxiety/depression- Resume home Cymbalta   BPH - Resume home Flomax   COPD - Resume home bronchodilators. Maintain O2 saturation above 90%   Obesity - BMI 33   Physical debility - PT OT assessment   Infrarenal AAA - Outpatient follow-up   Vague abdominal pain -now resolved   History of sigmoid abscess status post end colostomy - Colostomy care    Scheduled Meds:  apixaban  2.5 mg Oral BID   DULoxetine  60 mg Oral Daily   empagliflozin  10 mg Oral Daily   furosemide  20 mg Oral Daily   Glycerin (Adult)  1 suppository Rectal Once   insulin aspart  0-5 Units Subcutaneous QHS   insulin aspart  0-9 Units Subcutaneous TID WC   menthol-cetylpyridinium  1 lozenge Oral TID   metoprolol succinate  100 mg Oral Daily   montelukast  10 mg Oral QHS   pantoprazole  40 mg Oral BID   pneumococcal 20-valent conjugate vaccine  0.5 mL Intramuscular Tomorrow-1000   polyethylene glycol  17 g Oral BID   rosuvastatin  40 mg Oral QHS   senna-docusate  2 tablet Oral BID   tamsulosin  0.4 mg Oral Daily   traZODone  50 mg Oral QHS   umeclidinium-vilanterol  1 puff Inhalation Daily   Continuous Infusions:  PRN Meds:.acetaminophen, albuterol, alum & mag hydroxide-simeth, gabapentin, melatonin, menthol-cetylpyridinium, phenol, prochlorperazine, sodium chloride  Current Outpatient Medications  Medication Instructions   acetaminophen (TYLENOL) 650 mg, Every 8 hours PRN   albuterol (VENTOLIN HFA) 108 (90 Base) MCG/ACT inhaler 2 puffs, Every 4 hours PRN   ANORO ELLIPTA 62.5-25 MCG/ACT AEPB  1 puff, Daily   apixaban (ELIQUIS) 2.5 mg, Oral, 2 times daily   Carboxymethylcellulose Sodium (EYE DROPS OP) 2 drops, As needed   DULoxetine (CYMBALTA) 60 mg, Daily   ferrous sulfate 325 mg, Daily with breakfast   furosemide (LASIX) 20 mg, Oral, Daily   gabapentin (NEURONTIN) 100-300 mg, At bedtime PRN   Insulin Pen Needle (TECHLITE PEN NEEDLES) 32G X 4 MM MISC Use to inject Lantus at bedtime as directed   Jardiance 10 mg, Oral, Daily before breakfast   Lantus SoloStar 15 Units, Subcutaneous, Daily at bedtime   Melatonin 5 mg, At bedtime PRN   metoprolol succinate (TOPROL-XL) 100 mg, Oral, Daily, Take with or immediately following a meal.   montelukast (SINGULAIR) 10 mg, Daily at bedtime   pantoprazole (PROTONIX) 40 MG tablet Take 1 tablet by mouth 2 times daily.   polyethylene glycol (MIRALAX / GLYCOLAX) 17 g, Daily PRN   rosuvastatin (CRESTOR) 40 mg, Nightly   Semaglutide 14 mg, Daily   traMADol (ULTRAM) 50 mg, Oral, Every 6 hours PRN   traZODone (DESYREL) 50 mg, Daily at bedtime    Diet Orders (From admission, onward)     Start     Ordered   04/15/23 1322  Diet regular Room service appropriate? Yes; Fluid consistency: Thin  Diet effective now       Question Answer Comment  Room service appropriate? Yes   Fluid consistency: Thin      04/15/23 1321            DVT prophylaxis: apixaban (ELIQUIS) tablet 2.5 mg Start: 04/10/23 2200 apixaban (ELIQUIS) tablet 2.5 mg   Lab Results  Component Value Date   PLT 187 04/16/2023      Code Status: Full Code  Family Communication: Updated daughter over the phone  Status is: Inpatient Remains inpatient appropriate because: severity of ilness  Level of care: Telemetry Cardiac  Consultants:  Cardiology   Objective: Vitals:   04/16/23 0354 04/16/23 0604 04/16/23 0741 04/16/23 0929  BP: 129/79     Pulse: 71     Resp: (!) 22 14 (!) 22   Temp: (!) 97.4 F (36.3 C)  97.8 F (36.6 C)   TempSrc: Oral  Oral   SpO2: 98%    99%  Weight:  95.8 kg    Height:        Intake/Output Summary (Last 24 hours) at 04/16/2023 0958 Last data filed at 04/16/2023 0604 Gross per 24 hour  Intake 920 ml  Output 1700 ml  Net -780 ml   Wt Readings from Last 3 Encounters:  04/16/23 95.8 kg  03/02/23 96.6 kg  03/01/23 96.2 kg    Examination:  Constitutional: NAD Eyes: lids and conjunctivae normal, no scleral icterus ENMT: mmm Neck: normal, supple Respiratory: clear to auscultation bilaterally, no wheezing, no crackles. Normal respiratory effort.  Cardiovascular: Regular rate and rhythm, no murmurs / rubs / gallops. No LE edema. Abdomen: soft, no distention, no tenderness. Bowel sounds positive.   Data Reviewed: I have independently reviewed following labs and imaging studies   CBC Recent Labs  Lab 04/11/23 0506 04/12/23 0853 04/13/23 0344 04/14/23 0402 04/16/23 0457  WBC 7.6 8.5 8.1  6.6 6.6  HGB 14.3 15.0 15.1 13.8 13.3  HCT 44.8 45.8 47.6 43.5 41.9  PLT 208 216 196 186 187  MCV 89.6 88.4 89.1 88.8 89.7  MCH 28.6 29.0 28.3 28.2 28.5  MCHC 31.9 32.8 31.7 31.7 31.7  RDW 15.2 15.2 15.1 14.8 15.2    Recent Labs  Lab 04/10/23 1326 04/10/23 1338 04/11/23 0506 04/12/23 0853 04/13/23 0344 04/14/23 0402 04/15/23 0359 04/16/23 0457  NA 139  --  139 137 137 137 137 134*  K 3.9  --  3.9 3.6 3.6 3.8 4.4 4.2  CL 106  --  104 99 98 99 100 100  CO2 23  --  25 26 28 28 27 26   GLUCOSE 143*  --  138* 147* 139* 215* 156* 197*  BUN 19  --  22 28* 31* 36* 30* 31*  CREATININE 1.74*  --  1.65* 1.73* 1.73* 1.91* 1.74* 1.78*  CALCIUM 8.7*  --  8.6* 8.6* 8.4* 8.3* 8.8* 8.4*  AST 19  --   --   --  18 16 17 15   ALT 18  --   --   --  17 16 18 18   ALKPHOS 80  --   --   --  67 54 62 62  BILITOT 1.8*  --   --   --  1.5* 1.1 1.1 1.0  ALBUMIN 3.1*  --   --   --  2.9* 2.6* 3.0* 3.0*  MG  --   --  2.0 2.1 2.5* 2.3  --  2.4  LATICACIDVEN  --  1.7  --   --   --   --   --   --   TSH  --   --   --   --   --  1.351  --   --    BNP 602.3*  --   --   --   --   --   --   --     ------------------------------------------------------------------------------------------------------------------ No results for input(s): "CHOL", "HDL", "LDLCALC", "TRIG", "CHOLHDL", "LDLDIRECT" in the last 72 hours.  Lab Results  Component Value Date   HGBA1C 7.4 (H) 01/20/2023   ------------------------------------------------------------------------------------------------------------------ Recent Labs    04/14/23 0402  TSH 1.351    Cardiac Enzymes No results for input(s): "CKMB", "TROPONINI", "MYOGLOBIN" in the last 168 hours.  Invalid input(s): "CK" ------------------------------------------------------------------------------------------------------------------    Component Value Date/Time   BNP 602.3 (H) 04/10/2023 1326    CBG: Recent Labs  Lab 04/15/23 0749 04/15/23 1140 04/15/23 1625 04/15/23 2106 04/16/23 0810  GLUCAP 157* 162* 199* 161* 172*    Recent Results (from the past 240 hours)  Resp panel by RT-PCR (RSV, Flu A&B, Covid) Anterior Nasal Swab     Status: None   Collection Time: 04/10/23  1:26 PM   Specimen: Anterior Nasal Swab  Result Value Ref Range Status   SARS Coronavirus 2 by RT PCR NEGATIVE NEGATIVE Final   Influenza A by PCR NEGATIVE NEGATIVE Final   Influenza B by PCR NEGATIVE NEGATIVE Final    Comment: (NOTE) The Xpert Xpress SARS-CoV-2/FLU/RSV plus assay is intended as an aid in the diagnosis of influenza from Nasopharyngeal swab specimens and should not be used as a sole basis for treatment. Nasal washings and aspirates are unacceptable for Xpert Xpress SARS-CoV-2/FLU/RSV testing.  Fact Sheet for Patients: BloggerCourse.com  Fact Sheet for Healthcare Providers: SeriousBroker.it  This test is not yet approved or cleared by the [place] and has been authorized for  detection and/or diagnosis of SARS-CoV-2 by FDA under an  Emergency Use Authorization (EUA). This EUA will remain in effect (meaning this test can be used) for the duration of the COVID-19 declaration under Section 564(b)(1) of the Act, 21 U.S.C. section 360bbb-3(b)(1), unless the authorization is terminated or revoked.     Resp Syncytial Virus by PCR NEGATIVE NEGATIVE Final    Comment: (NOTE) Fact Sheet for Patients: BloggerCourse.com  Fact Sheet for Healthcare Providers: SeriousBroker.it  This test is not yet approved or cleared by the [place] FDA and has been authorized for detection and/or diagnosis of SARS-CoV-2 by FDA under an Emergency Use Authorization (EUA). This EUA will remain in effect (meaning this test can be used) for the duration of the COVID-19 declaration under Section 564(b)(1) of the Act, 21 U.S.C. section 360bbb-3(b)(1), unless the authorization is terminated or revoked.  Performed at [facility], [address] [place], [place] [address]   Blood culture (routine x 2)     Status: None   Collection Time: 04/10/23  1:29 PM   Specimen: BLOOD  Result Value Ref Range Status   Specimen Description BLOOD RIGHT ANTECUBITAL  Final   Special Requests   Final    BOTTLES DRAWN AEROBIC AND ANAEROBIC Blood Culture results may not be optimal due to an inadequate volume of blood received in culture bottles   Culture   Final    NO GROWTH 5 DAYS Performed at [facility], [address], [place], [place] [address]    Report Status 04/15/2023 FINAL  Final  Blood culture (routine x 2)     Status: None   Collection Time: 04/10/23  1:34 PM   Specimen: BLOOD LEFT WRIST  Result Value Ref Range Status   Specimen Description BLOOD LEFT WRIST  Final   Special Requests   Final    BOTTLES DRAWN AEROBIC AND ANAEROBIC Blood Culture adequate volume   Culture   Final    NO GROWTH 5 DAYS Performed at [facility], [address], [place], [place] [address]     Report Status 04/15/2023 FINAL  Final  OVA + PARASITE EXAM     Status: None   Collection Time: 04/11/23  3:52 PM   Specimen: Stool  Result Value Ref Range Status   OVA + PARASITE EXAM Final report  Final    Comment: (NOTE) These results were obtained using wet preparation(s) and trichrome stained smear. This test does not include testing for Cryptosporidium parvum, Cyclospora, or Microsporidia. Performed At: [facility] [address] [place], [place] [id number] Starling Manns MD Ph:[phone]    Source of Sample STOOL  Final    Comment: Performed at [facility], [address], [place], [place] [address]     Radiology Studies: ECHO TEE Result Date: 04/15/2023    TRANSESOPHOGEAL ECHO REPORT   Patient Name:   Eddie Graham Date of Exam: 04/15/2023 Medical Rec #:  [id number]                 Height:       66.0 in Accession #:    [id number]                Weight:       210.4 lb Date of Birth:  May 14, 1934                 BSA:          2.043 m Patient Age:  88 years                  BP:           98/80 mmHg Patient Gender: M                         HR:           104 bpm. Exam Location:  Inpatient Procedure: Transesophageal Echo, Cardiac Doppler and Color Doppler Indications:     Arrhythmia  History:         Patient has prior history of Echocardiogram examinations, most                  recent 04/12/2023. Abnormal ECG, COPD; Risk                  Factors:Hypertension and Diabetes.  Sonographer:     Darlys Gales Referring Phys:  [id number] Veverly Fells SKAINS Diagnosing Phys: [name] MD PROCEDURE: After discussion of the risks and benefits of a TEE, an informed consent was obtained from the patient. The transesophogeal probe was passed without difficulty through the esophogus of the patient. Sedation performed by different physician. The patient was monitored while under deep sedation. Anesthestetic sedation was provided intravenously by Anesthesiology: 120mg  of  Propofol, 60mg  of Lidocaine. The patient's vital signs; including heart rate, blood pressure, and oxygen saturation; remained stable throughout the procedure. The patient developed no complications during the procedure. A successful direct current cardioversion was performed at 200 joules with 1 attempt.  IMPRESSIONS  1. Left ventricular ejection fraction, by estimation, is 25 to 30%. The left ventricle has severely decreased function. The left ventricle demonstrates global hypokinesis.  2. Right ventricular systolic function is normal. The right ventricular size is normal.  3. No left atrial/left atrial appendage thrombus was detected.  4. The mitral valve is normal in structure. Mild mitral valve regurgitation. No evidence of mitral stenosis.  5. Tricuspid valve regurgitation is moderate.  6. The aortic valve is tricuspid. Aortic valve regurgitation is mild. No aortic stenosis is present.  7. The inferior vena cava is normal in size with greater than 50% respiratory variability, suggesting right atrial pressure of 3 mmHg. Conclusion(s)/Recommendation(s): No LA/LAA thrombus identified. Successful cardioversion performed with restoration of normal sinus rhythm. FINDINGS  Left Ventricle: Left ventricular ejection fraction, by estimation, is 25 to 30%. The left ventricle has severely decreased function. The left ventricle demonstrates global hypokinesis. The left ventricular internal cavity size was normal in size. There is no left ventricular hypertrophy. Right Ventricle: The right ventricular size is normal. No increase in right ventricular wall thickness. Right ventricular systolic function is normal. Left Atrium: Left atrial size was normal in size. No left atrial/left atrial appendage thrombus was detected. Right Atrium: Right atrial size was normal in size. Pericardium: There is no evidence of pericardial effusion. Mitral Valve: The mitral valve is normal in structure. Mild mitral valve regurgitation. No  evidence of mitral valve stenosis. Tricuspid Valve: The tricuspid valve is normal in structure. Tricuspid valve regurgitation is moderate . No evidence of tricuspid stenosis. Aortic Valve: The aortic valve is tricuspid. Aortic valve regurgitation is mild. No aortic stenosis is present. Pulmonic Valve: The pulmonic valve was normal in structure. Pulmonic valve regurgitation is not visualized. No evidence of pulmonic stenosis. Aorta: The aortic root is normal in size and structure. Venous: The inferior vena cava is normal in size with greater than 50% respiratory variability, suggesting right  atrial pressure of 3 mmHg. IAS/Shunts: No atrial level shunt detected by color flow Doppler. [name] MD Electronically signed by [name] MD Signature Date/Time: 04/15/2023/5:04:46 PM    Final    EP STUDY Result Date: 04/15/2023 See surgical note for result.     Pamella Pert, MD, PhD [facility]  Between 7 am - 7 pm I am available, please contact me via Amion (for emergencies) or Securechat (non urgent messages)  Between 7 pm - 7 am I am not available, please contact night coverage MD/APP via Amion

## 2023-04-16 NOTE — Plan of Care (Signed)

## 2023-04-16 NOTE — Progress Notes (Signed)
Mobility Specialist Progress Note:    04/16/23 1200  Oxygen Therapy  O2 Device Nasal Cannula  O2 Flow Rate (L/min) 4 L/min  Mobility  Activity Ambulated with assistance in hallway  Level of Assistance Contact guard assist, steadying assist  Assistive Device Cane  Distance Ambulated (ft) 400 ft  Activity Response Tolerated well  Mobility Referral Yes  Mobility visit 1 Mobility  Mobility Specialist Start Time (ACUTE ONLY) 1118  Mobility Specialist Stop Time (ACUTE ONLY) 1138  Mobility Specialist Time Calculation (min) (ACUTE ONLY) 20 min   Pt received on EOB, agreeable to mobility. Asymptomatic w/ no complaints throughout. Pt left in bed with call bell and all needs met.  Eddie Graham Mobility Specialist Please contact via Special educational needs teacher or Rehab office at [phone]

## 2023-04-16 NOTE — Plan of Care (Signed)
Will continue to monitor patient.

## 2023-04-17 DIAGNOSIS — I509 Heart failure, unspecified: Secondary | ICD-10-CM | POA: Diagnosis not present

## 2023-04-17 DIAGNOSIS — R079 Chest pain, unspecified: Secondary | ICD-10-CM | POA: Diagnosis not present

## 2023-04-17 LAB — BASIC METABOLIC PANEL
Anion gap: 11 (ref 5–15)
BUN: 25 mg/dL — ABNORMAL HIGH (ref 8–23)
CO2: 21 mmol/L — ABNORMAL LOW (ref 22–32)
Calcium: 8.4 mg/dL — ABNORMAL LOW (ref 8.9–10.3)
Chloride: 103 mmol/L (ref 98–111)
Creatinine, Ser: 1.55 mg/dL — ABNORMAL HIGH (ref 0.61–1.24)
GFR, Estimated: 43 mL/min — ABNORMAL LOW (ref >60)
Glucose, Bld: 159 mg/dL — ABNORMAL HIGH (ref 70–99)
Potassium: 4.1 mmol/L (ref 3.5–5.1)
Sodium: 135 mmol/L (ref 135–145)

## 2023-04-17 LAB — GLUCOSE, CAPILLARY
Glucose-Capillary: 146 mg/dL — ABNORMAL HIGH (ref 70–99)
Glucose-Capillary: 253 mg/dL — ABNORMAL HIGH (ref 70–99)

## 2023-04-17 NOTE — Discharge Summary (Signed)
Physician Discharge Summary  Eddie Graham ZDG:[id number] DOB: 1934/08/03 DOA: 04/10/2023  PCP: Andreas Blower., MD  Admit date: 04/10/2023 Discharge date: 04/17/2023  Admitted From: home Disposition:  home  Recommendations for Outpatient Follow-up:  Follow up with PCP in 1-2 weeks  Home Health: PT Equipment/Devices: home O2  Discharge Condition: stable CODE STATUS: Full code Diet Orders (From admission, onward)     Start     Ordered   04/15/23 1322  Diet regular Room service appropriate? Yes; Fluid consistency: Thin  Diet effective now       Question Answer Comment  Room service appropriate? Yes   Fluid consistency: Thin      04/15/23 1321            HPI: Per admitting MD, Eddie Graham is a 88 y.o. male with medical history significant for chronic HFrEF with LVEF 40 to 45%(01/27/23), coronary artery disease status post CABG in 2011, high-grade bladder cancer status post TURP, history of sigmoid colon abscess status post end colostomy, anemia of chronic disease, AAA, COPD, chronic hypoxia on 4 L nasal cannula at baseline, CKD 3B, hypertension, hyperlipidemia, paroxysmal A-fib/A-flutter on Eliquis, who presents to the ED with complaints of chest pain today, centrally located, moderate to severe and nonradiating.  Associated with.progressive shortness of breath with minimal exertion that started a few days prior.  Endorses he has been compliant with his home cardiac medications.  The patient lives alone however he has a friend who checks on him frequently and who assists him with his home medications.  For the past couple of days his dyspnea has worsened to the point where he has had to increase his O2 supplementation from 4 L to 5 L.  EMS was activated.  He received a full dose aspirin via EMS.  He presented to the ED for further evaluation. In the ED, afebrile, hypertensive and tachycardic with increased oxygen requirement.  Lab work notable for elevated  BNP greater than 600 above baseline, high-sensitivity troponin flat, 25, 25.  No evidence of acute ischemia on twelve-lead EKG.  CBC is unremarkable.  A CT angio chest/abdomen/pelvis revealed the following: Small bilateral pleural effusions and associated atelectasis and other chronic findings. The patient received opioid-based IV analgesics.  TRH, hospitalist service, was asked to admit.  Hospital Course / Discharge diagnoses: Principal Problem:   Chest pain Active Problems:   Atrial flutter [facility])   Principal problem Acute on chronic systolic CHF-cardiology consulted and followed patient while hospitalized. Repeat 2D echo shows an EF of 25-30%.  Prior echo-EF of 40-45%.  BNP was elevated on admission, had bilateral pleural effusions and mild pulmonary edema.  He was diuresed with Lasix, he is net -7.4 L.  Weight has improved from 216 on admission to 210 on discharge.  Overall he is improved, cleared to discharge by cardiology and will be sent home in stable condition and to resume his home medications as below   Active problems Atrial flutter with RVR -rates were difficult to control, contributing to his dyspnea.  He underwent TEE cardioversion on 12/19.  He has remained stable on telemetry for couple days afterwards, in sinus rhythm, continue his metoprolol upon discharge as well as Eliquis Chest pain-atypical, flat troponins Acute on chronic hypoxic respiratory failure secondary to bilateral pleural effusions and mild pulmonary edema -At baseline, patient is on 4 L nasal cannula.  Continue oxygen, appears at baseline, no wheezing.  He did complain of throat issues that affect his breathing, CT scan of the  neck was unremarkable  CKD 3B, at baseline - Baseline creatinine 1.6-1.9.  Stable today Type 2 diabetes with hyperglycemia - Last hemoglobin A1c 7.4 on 01/20/2023, which is acceptable in his age group.  Continue home medications Coronary artery disease status post CABG -noted, continue home  regimen Hypertension -continue metoprolol, blood pressure controlled Hyperlipidemia -continue statin Chronic anxiety/depression- Resume home Cymbalta BPH - Resume home Flomax COPD - Resume home bronchodilators. Maintain O2 saturation above 90% Obesity - BMI 33 Physical debility - PT OT assessment Infrarenal AAA - Outpatient follow-up Abdominal pain-due to constipation, resolved with bowel regimen History of sigmoid abscess status post end colostomy - Colostomy care  Sepsis ruled out   Discharge Instructions   Allergies as of 04/17/2023   No Known Allergies      Medication List     TAKE these medications    acetaminophen 650 MG CR tablet Commonly known as: TYLENOL Take 650 mg by mouth every 8 (eight) hours as needed for pain.   albuterol 108 (90 Base) MCG/ACT inhaler Commonly known as: VENTOLIN HFA Inhale 2 puffs into the lungs every 4 (four) hours as needed for wheezing.   Anoro Ellipta 62.5-25 MCG/ACT Aepb Generic drug: umeclidinium-vilanterol Inhale 1 puff into the lungs daily.   apixaban 2.5 MG Tabs tablet Commonly known as: ELIQUIS Take 1 tablet (2.5 mg total) by mouth 2 (two) times daily.   DULoxetine 60 MG capsule Commonly known as: CYMBALTA Take 60 mg by mouth daily.   EYE DROPS OP Place 2 drops into both eyes as needed (dryness/itching).   ferrous sulfate 325 (65 FE) MG tablet Take 325 mg by mouth daily with breakfast.   furosemide 20 MG tablet Commonly known as: LASIX Tome 1 tableta (20 mg en total) por va oral diariamente. (Take 1 tablet (20 mg total) by mouth daily.)   gabapentin 100 MG capsule Commonly known as: NEURONTIN Take 100-300 mg by mouth at bedtime as needed (leg pain).   Jardiance 10 MG Tabs tablet Generic drug: empagliflozin Tome 1 tableta (10 mg en total) por va oral diariamente antes de desayunar. (Take 1 tablet (10 mg total) by mouth daily before breakfast.)   Lantus SoloStar 100 UNIT/ML Solostar Pen Generic drug:  insulin glargine Inject 15 Units into the skin at bedtime. What changed: how much to take   Melatonin 5 MG Caps Take 5 mg by mouth at bedtime as needed (sleep).   metoprolol succinate 100 MG 24 hr tablet Commonly known as: TOPROL-XL Take 1 tablet (100 mg total) by mouth daily. Take with or immediately following a meal.   montelukast 10 MG tablet Commonly known as: SINGULAIR Take 10 mg by mouth at bedtime.   pantoprazole 40 MG tablet Commonly known as: PROTONIX Tome 1 tableta por va oral 2 veces al da. (Take 1 tablet by mouth 2 times daily.)   polyethylene glycol 17 g packet Commonly known as: MIRALAX / GLYCOLAX Take 17 g by mouth daily as needed for mild constipation.   rosuvastatin 40 MG tablet Commonly known as: CRESTOR Take 40 mg by mouth at bedtime.   Semaglutide 14 MG Tabs Take 14 mg by mouth daily.   TechLite Pen Needles 32G X 4 MM Misc Generic drug: Insulin Pen Needle Use to inject Lantus at bedtime as directed   traMADol 50 MG tablet Commonly known as: Ultram Take 1 tablet (50 mg total) by mouth every 6 (six) hours as needed.   traZODone 50 MG tablet Commonly known as: DESYREL Take 50 mg  by mouth at bedtime.        Follow-up Information     [facility] Follow up.   Why: Portable oxygen tanks to be delivered to the room. Contact information: [employer] Address: [address], [place], [place]        [facility] [place], [place] Follow up.   Why: Registered Nurse, and Physical Therapy-office to call with visit times. Contact information: [address]urell [address] [place] [address] [phone]                 Consultations: Cardiology   Procedures/Studies:  CT SOFT TISSUE NECK WO CONTRAST Result Date: 04/16/2023 CLINICAL DATA:  Thyroid nodule EXAM: CT NECK WITHOUT CONTRAST TECHNIQUE: Multidetector CT imaging of the neck was performed following the standard protocol without  intravenous contrast. RADIATION DOSE REDUCTION: This exam was performed according to the departmental dose-optimization program which includes automated exposure control, adjustment of the mA and/or kV according to patient size and/or use of iterative reconstruction technique. COMPARISON:  None Available. FINDINGS: Pharynx and larynx: Normal. No mass or swelling. Salivary glands: No inflammation, mass, or stone. Thyroid: Normal.  No distinct nodule is seen in the thyroid. Lymph nodes: None enlarged or abnormal density. Vascular: Aortic and carotid atherosclerosis. Otherwise normal noncontrast appearance of the vasculature. Limited intracranial: Negative. Visualized orbits: Negative. Mastoids and visualized paranasal sinuses: Mucosal thickening in the ethmoid air cells. The mastoids are well aerated. Skeleton: Degenerative changes in the cervical spine. No acute osseous abnormality. Upper chest: Small bilateral pleural effusions. Associated atelectasis. IMPRESSION: 1. No acute process in the neck. No distinct nodule is seen in the thyroid. If there is persistent concern for a thyroid abnormality, consider non emergent ultrasound of the thyroid. 2. Small bilateral pleural effusions. 3. Aortic atherosclerosis. Aortic Atherosclerosis (ICD10-I70.0). Electronically Signed   By: Wiliam Ke M.D.   On: 04/16/2023 16:11   ECHO TEE Result Date: 04/15/2023    TRANSESOPHOGEAL ECHO REPORT   Patient Name:   Eddie Graham Date of Exam: 04/15/2023 Medical Rec #:  [id number]                 Height:       66.0 in Accession #:    [id number]                Weight:       210.4 lb Date of Birth:  15-Aug-1934                 BSA:          2.043 m Patient Age:    88 years                  BP:           98/80 mmHg Patient Gender: M                         HR:           104 bpm. Exam Location:  Inpatient Procedure: Transesophageal Echo, Cardiac Doppler and Color Doppler Indications:     Arrhythmia  History:         Patient  has prior history of Echocardiogram examinations, most                  recent 04/12/2023. Abnormal ECG, COPD; Risk  Factors:Hypertension and Diabetes.  Sonographer:     Darlys Gales Referring Phys:  [id number] SKAINS Diagnosing Phys: [name] MD PROCEDURE: After discussion of the risks and benefits of a TEE, an informed consent was obtained from the patient. The transesophogeal probe was passed without difficulty through the esophogus of the patient. Sedation performed by different physician. The patient was monitored while under deep sedation. Anesthestetic sedation was provided intravenously by Anesthesiology: 120mg  of Propofol, 60mg  of Lidocaine. The patient's vital signs; including heart rate, blood pressure, and oxygen saturation; remained stable throughout the procedure. The patient developed no complications during the procedure. A successful direct current cardioversion was performed at 200 joules with 1 attempt.  IMPRESSIONS  1. Left ventricular ejection fraction, by estimation, is 25 to 30%. The left ventricle has severely decreased function. The left ventricle demonstrates global hypokinesis.  2. Right ventricular systolic function is normal. The right ventricular size is normal.  3. No left atrial/left atrial appendage thrombus was detected.  4. The mitral valve is normal in structure. Mild mitral valve regurgitation. No evidence of mitral stenosis.  5. Tricuspid valve regurgitation is moderate.  6. The aortic valve is tricuspid. Aortic valve regurgitation is mild. No aortic stenosis is present.  7. The inferior vena cava is normal in size with greater than 50% respiratory variability, suggesting right atrial pressure of 3 mmHg. Conclusion(s)/Recommendation(s): No LA/LAA thrombus identified. Successful cardioversion performed with restoration of normal sinus rhythm. FINDINGS  Left Ventricle: Left ventricular ejection fraction, by estimation, is 25 to 30%. The left ventricle has  severely decreased function. The left ventricle demonstrates global hypokinesis. The left ventricular internal cavity size was normal in size. There is no left ventricular hypertrophy. Right Ventricle: The right ventricular size is normal. No increase in right ventricular wall thickness. Right ventricular systolic function is normal. Left Atrium: Left atrial size was normal in size. No left atrial/left atrial appendage thrombus was detected. Right Atrium: Right atrial size was normal in size. Pericardium: There is no evidence of pericardial effusion. Mitral Valve: The mitral valve is normal in structure. Mild mitral valve regurgitation. No evidence of mitral valve stenosis. Tricuspid Valve: The tricuspid valve is normal in structure. Tricuspid valve regurgitation is moderate . No evidence of tricuspid stenosis. Aortic Valve: The aortic valve is tricuspid. Aortic valve regurgitation is mild. No aortic stenosis is present. Pulmonic Valve: The pulmonic valve was normal in structure. Pulmonic valve regurgitation is not visualized. No evidence of pulmonic stenosis. Aorta: The aortic root is normal in size and structure. Venous: The inferior vena cava is normal in size with greater than 50% respiratory variability, suggesting right atrial pressure of 3 mmHg. IAS/Shunts: No atrial level shunt detected by color flow Doppler. [name] MD Electronically signed by [name] MD Signature Date/Time: 04/15/2023/5:04:46 PM    Final    EP STUDY Result Date: 04/15/2023 See surgical note for result.  DG Abd Portable 1V Result Date: 04/12/2023 CLINICAL DATA:  Abdominal pain EXAM: PORTABLE ABDOMEN - 1 VIEW COMPARISON:  CT chest abdomen and pelvis 04/10/2023. FINDINGS: The bowel gas pattern is normal. There is a large amount of stool throughout the colon. There are atherosclerotic calcifications of the aorta. Punctate densities in the left abdomen are favored as bowel content. IMPRESSION: 1. Large amount of stool throughout  the colon. 2. No bowel obstruction. Electronically Signed   By: Darliss Cheney M.D.   On: 04/12/2023 23:03   ECHOCARDIOGRAM LIMITED Result Date: 04/12/2023    ECHOCARDIOGRAM LIMITED REPORT  Patient Name:   Eddie Graham Date of Exam: 04/12/2023 Medical Rec #:  [id number]                 Height:       66.0 in Accession #:    [id number]                Weight:       207.9 lb Date of Birth:  [birth year]                 BSA:          2.033 m Patient Age:    88 years                  BP:           148/90 mmHg Patient Gender: M                         HR:           145 bpm. Exam Location:  Inpatient Procedure: Limited Echo and Intracardiac Opacification Agent Indications:    CHF-Acute Systolic I50.21  History:        Patient has prior history of Echocardiogram examinations, most                 recent 01/27/2023. Prior CABG, COPD; Risk Factors:Hypertension                 and Diabetes.  Sonographer:    Darlys Gales Referring Phys: [id number] CAROLE N HALL IMPRESSIONS  1. Left ventricular ejection fraction, by estimation, is 25 to 30%. The left ventricle has severely decreased function. The left ventricle demonstrates global hypokinesis.  2. Right ventricular systolic function is moderately reduced. The right ventricular size is normal.  3. Left atrial size was mildly dilated.  4. Right atrial size was mildly dilated.  5. The mitral valve is normal in structure. Color Doppler not performed  6. The aortic valve is tricuspid. There is mild calcification of the aortic valve. Color Doppler not performed  7. The inferior vena cava is dilated in size with <50% respiratory variability, suggesting right atrial pressure of 15 mmHg. FINDINGS  Left Ventricle: Left ventricular ejection fraction, by estimation, is 25 to 30%. The left ventricle has severely decreased function. The left ventricle demonstrates global hypokinesis. The left ventricular internal cavity size was normal in size. There is no left ventricular  hypertrophy. Right Ventricle: The right ventricular size is normal. No increase in right ventricular wall thickness. Right ventricular systolic function is moderately reduced. Left Atrium: Left atrial size was mildly dilated. Right Atrium: Right atrial size was mildly dilated. Pericardium: There is no evidence of pericardial effusion. Mitral Valve: The mitral valve was not well visualized. Tricuspid Valve: The tricuspid valve is normal in structure. Tricuspid valve regurgitation is not demonstrated. No evidence of tricuspid stenosis. Aortic Valve: The aortic valve is tricuspid. There is mild calcification of the aortic valve. Pulmonic Valve: The pulmonic valve was not well visualized. Pulmonic valve regurgitation is not visualized. No evidence of pulmonic stenosis. Aorta: Aortic root could not be assessed. Venous: The inferior vena cava is dilated in size with less than 50% respiratory variability, suggesting right atrial pressure of 15 mmHg. IAS/Shunts: No atrial level shunt detected by color flow Doppler. LEFT VENTRICLE PLAX 2D LVIDd:         4.50 cm LVIDs:         4.00 cm LV PW:  0.90 cm LV IVS:        1.00 cm LVOT diam:     2.00 cm LVOT Area:     3.14 cm  IVC IVC diam: 2.30 cm LEFT ATRIUM             Index        RIGHT ATRIUM           Index LA Vol (A2C):   68.9 ml 33.89 ml/m  RA Area:     20.50 cm LA Vol (A4C):   36.0 ml 17.71 ml/m  RA Volume:   58.50 ml  28.78 ml/m LA Biplane Vol: 51.0 ml 25.09 ml/m   SHUNTS Systemic Diam: 2.00 cm Arvilla Meres MD Electronically signed by Arvilla Meres MD Signature Date/Time: 04/12/2023/9:22:03 AM    Final    CT Angio Chest/Abd/Pel for Dissection W and/or Wo Contrast Result Date: 04/10/2023 CLINICAL DATA:  Chest pain and shortness of breath. Acute aortic syndrome suspected EXAM: CT ANGIOGRAPHY CHEST, ABDOMEN AND PELVIS TECHNIQUE: Non-contrast CT of the chest was initially obtained. Multidetector CT imaging through the chest, abdomen and pelvis was  performed using the standard protocol during bolus administration of intravenous contrast. Multiplanar reconstructed images and MIPs were obtained and reviewed to evaluate the vascular anatomy. RADIATION DOSE REDUCTION: This exam was performed according to the departmental dose-optimization program which includes automated exposure control, adjustment of the mA and/or kV according to patient size and/or use of iterative reconstruction technique. CONTRAST:  75mL OMNIPAQUE IOHEXOL 350 MG/ML SOLN COMPARISON:  Radiographs earlier today and CT abdomen and pelvis 02/25/2023 FINDINGS: CTA CHEST FINDINGS Cardiovascular: Coronary artery and aortic atherosclerotic calcification. No pericardial effusion. Irregularity about the ascending aortic wall is favored chronic/postoperative. No aortic aneurysm or dissection. Mediastinum/Nodes: Trachea and esophagus are unremarkable. No thoracic adenopathy. Lungs/Pleura: Emphysema. Small bilateral pleural effusions and associated atelectasis. No pneumothorax. Musculoskeletal: No acute fracture. Review of the MIP images confirms the above findings. CTA ABDOMEN AND PELVIS FINDINGS VASCULAR Aorta: No aneurysm or dissection. Broad-based penetrating atherosclerotic ulcer in the distal infrarenal abdominal aorta just proximal to the bifurcation. The base measures 1.5 x 1.0 cm with 1.0 cm in depth. The maximum aortic width at this site measures 3.3 cm. This is not substantially changed from 02/25/2023. Celiac: No hemodynamically significant stenosis. 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 SMA: Moderate narrowing at the origin secondary to calcified atherosclerotic plaque no aneurysm or dissection. Renals: Moderate narrowing at the origins of both renal arteries due to calcified atherosclerotic plaque. No aneurysm or dissection. IMA: Patent. Inflow: Scattered calcified atherosclerotic plaque  without aneurysm or dissection. Veins: No obvious venous abnormality within the limitations of this arterial phase study. Review of the MIP images confirms the above findings. NON-VASCULAR Hepatobiliary: No focal liver abnormality is seen. No gallstones, gallbladder wall thickening, or biliary dilatation. Pancreas: Unremarkable. Spleen: Unremarkable. Adrenals/Urinary Tract: Stable adrenal glands. No urinary calculi or hydronephrosis. Unremarkable bladder. Stomach/Bowel: Stomach is within normal limits. Normal caliber large and small bowel. Normal appendix. Postoperative change of partial colectomy with Hartmann's pouch and left lower quadrant colostomy. Question mild wall thickening about the colon entering the colostomy. Colonic diverticulosis without diverticulitis. Lymphatic: No lymphadenopathy. Reproductive: Prostate is unremarkable. Other: Small amount of fluid and mild stranding interposed between the proximal Hartmann's pouch and a loop of small bowel (series 7/image 161). No free intraperitoneal air. Musculoskeletal: No acute fracture. Review of the MIP images  confirms the above findings. IMPRESSION: 1. Presumed postoperative change of ascending aortic aneurysm repair. Correlate with surgical history. 2. 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 Recommend referral to or continued care with a vascular specialist. 3. Small bilateral pleural effusions and associated atelectasis. 4. Postoperative change of partial colectomy with left lower quadrant colostomy. Question mild colitis of the colon entering the colostomy. 5. Small amount of fluid and mild stranding interposed between the proximal aspect of the patient's colectomy site and a loop of small bowel. This may be due to enteritis or pouchitis. No abscess or free air. Aortic Atherosclerosis (ICD10-I70.0) and Emphysema (ICD10-J43.9).  Electronically Signed   By: Minerva Fester M.D.   On: 04/10/2023 17:43   DG Chest Port 1 View Result Date: 04/10/2023 CLINICAL DATA:  Shortness of breath. EXAM: PORTABLE CHEST 1 VIEW COMPARISON:  Chest radiographs 02/25/2023, 01/28/2020, 01/26/2020, 05/24/2022; CT abdomen and pelvis 07/18/2022 FINDINGS: Status post median sternotomy. Cardiac silhouette is again mildly enlarged. Mediastinal contours are grossly within normal limits for AP technique. Chronic bilateral interstitial thickening and coarsening is unchanged from multiple prior radiographs. Left retrocardiac and costophrenic angle opacity is similar to 02/25/2023 and may represent pleural fluid and/or airspace disease. Note is made of a moderately large epicardial fat pad normally seen in this region on prior CT 07/18/2022. IMPRESSION: 1. Left retrocardiac and costophrenic angle opacity is similar to 02/25/2023. At least a portion of this appears to represent the patient's normal epicardial fat pad. As on 02/25/2023, it is difficult to exclude superimposed mild left basilar atelectasis and/or pneumonia with possible mild pleural effusion. 2. Chronic bilateral interstitial thickening and coarsening is unchanged from multiple prior radiographs. Electronically Signed   By: Neita Garnet M.D.   On: 04/10/2023 15:37     Subjective: - no chest pain, shortness of breath, no abdominal pain, nausea or vomiting.   Discharge Exam: BP 109/62 (BP Location: Right Arm)   Pulse 66   Temp 98.1 F (36.7 C) (Oral)   Resp 19   Ht 5\' 6"  (1.676 m)   Wt 95.4 kg   SpO2 96%   BMI 33.95 kg/m   General: Pt is alert, awake, not in acute distress Cardiovascular: RRR, S1/S2 +, no rubs, no gallops Respiratory: CTA bilaterally, no wheezing, no rhonchi Abdominal: Soft, NT, ND, bowel sounds + Extremities: no edema, no cyanosis   The results of significant diagnostics from this hospitalization (including imaging, microbiology, ancillary and laboratory) are  listed below for reference.     Microbiology: Recent Results (from the past 240 hours)  Resp panel by RT-PCR (RSV, Flu A&B, Covid) Anterior Nasal Swab     Status: None   Collection Time: 04/10/23  1:26 PM   Specimen: Anterior Nasal Swab  Result Value Ref Range Status   SARS Coronavirus 2 by RT PCR NEGATIVE NEGATIVE Final   Influenza A by PCR NEGATIVE NEGATIVE Final   Influenza B by PCR NEGATIVE NEGATIVE Final    Comment: (NOTE) The Xpert Xpress SARS-CoV-2/FLU/RSV plus assay is intended as an aid in the diagnosis of influenza from Nasopharyngeal swab specimens and should not be used as a sole basis for treatment. Nasal washings and aspirates are unacceptable for Xpert Xpress SARS-CoV-2/FLU/RSV testing.  Fact Sheet for Patients: BloggerCourse.com  Fact Sheet for Healthcare Providers: SeriousBroker.it  This test is not yet approved or cleared by the [place] FDA and has been authorized for detection  and/or diagnosis of SARS-CoV-2 by FDA under an Emergency Use Authorization (EUA). This EUA will remain in effect (meaning this test can be used) for the duration of the COVID-19 declaration under Section 564(b)(1) of the Act, 21 U.S.C. section 360bbb-3(b)(1), unless the authorization is terminated or revoked.     Resp Syncytial Virus by PCR NEGATIVE NEGATIVE Final    Comment: (NOTE) Fact Sheet for Patients: BloggerCourse.com  Fact Sheet for Healthcare Providers: SeriousBroker.it  This test is not yet approved or cleared by the [place] FDA and has been authorized for detection and/or diagnosis of SARS-CoV-2 by FDA under an Emergency Use Authorization (EUA). This EUA will remain in effect (meaning this test can be used) for the duration of the COVID-19 declaration under Section 564(b)(1) of the Act, 21 U.S.C. section 360bbb-3(b)(1), unless the authorization is terminated  or revoked.  Performed at [facility], [address] [place], [place] [address]   Blood culture (routine x 2)     Status: None   Collection Time: 04/10/23  1:29 PM   Specimen: BLOOD  Result Value Ref Range Status   Specimen Description BLOOD RIGHT ANTECUBITAL  Final   Special Requests   Final    BOTTLES DRAWN AEROBIC AND ANAEROBIC Blood Culture results may not be optimal due to an inadequate volume of blood received in culture bottles   Culture   Final    NO GROWTH 5 DAYS Performed at [facility], [address], [place], [place] [address]    Report Status 04/15/2023 FINAL  Final  Blood culture (routine x 2)     Status: None   Collection Time: 04/10/23  1:34 PM   Specimen: BLOOD LEFT WRIST  Result Value Ref Range Status   Specimen Description BLOOD LEFT WRIST  Final   Special Requests   Final    BOTTLES DRAWN AEROBIC AND ANAEROBIC Blood Culture adequate volume   Culture   Final    NO GROWTH 5 DAYS Performed at [facility], [address], [place], [place] [address]    Report Status 04/15/2023 FINAL  Final  OVA + PARASITE EXAM     Status: None   Collection Time: 04/11/23  3:52 PM   Specimen: Stool  Result Value Ref Range Status   OVA + PARASITE EXAM Final report  Final    Comment: (NOTE) These results were obtained using wet preparation(s) and trichrome stained smear. This test does not include testing for Cryptosporidium parvum, Cyclospora, or Microsporidia. Performed At: [facility] [address] [place], [place] [id number] Starling Manns MD Ph:[phone]    Source of Sample STOOL  Final    Comment: Performed at [facility], [address], [place], [place] [address]     Labs: Basic Metabolic Panel: Recent Labs  Lab 04/11/23 0506 04/12/23 0853 04/13/23 0344 04/14/23 0402 04/15/23 0359 04/16/23 0457 04/17/23 0330  NA 139 137 137 137 137 134* 135  K 3.9 3.6 3.6 3.8 4.4 4.2 4.1  CL 104 99 98 99 100 100 103   CO2 25 26 28 28 27 26  21*  GLUCOSE 138* 147* 139* 215* 156* 197* 159*  BUN 22 28* 31* 36* 30* 31* 25*  CREATININE 1.65* 1.73* 1.73* 1.91* 1.74* 1.78* 1.55*  CALCIUM 8.6* 8.6* 8.4* 8.3* 8.8* 8.4* 8.4*  MG 2.0 2.1 2.5* 2.3  --  2.4  --   PHOS 4.0  --   --   --   --   --   --  Liver Function Tests: Recent Labs  Lab 04/10/23 1326 04/13/23 0344 04/14/23 0402 04/15/23 0359 04/16/23 0457  AST 19 18 16 17 15   ALT 18 17 16 18 18   ALKPHOS 80 67 54 62 62  BILITOT 1.8* 1.5* 1.1 1.1 1.0  PROT 6.5 5.6* 5.2* 5.9* 5.7*  ALBUMIN 3.1* 2.9* 2.6* 3.0* 3.0*   CBC: Recent Labs  Lab 04/11/23 0506 04/12/23 0853 04/13/23 0344 04/14/23 0402 04/16/23 0457  WBC 7.6 8.5 8.1 6.6 6.6  HGB 14.3 15.0 15.1 13.8 13.3  HCT 44.8 45.8 47.6 43.5 41.9  MCV 89.6 88.4 89.1 88.8 89.7  PLT 208 216 196 186 187   CBG: Recent Labs  Lab 04/16/23 0810 04/16/23 1244 04/16/23 1655 04/16/23 2137 04/17/23 0751  GLUCAP 172* 213* 111* 240* 146*   Hgb A1c No results for input(s): "HGBA1C" in the last 72 hours. Lipid Profile No results for input(s): "CHOL", "HDL", "LDLCALC", "TRIG", "CHOLHDL", "LDLDIRECT" in the last 72 hours. Thyroid function studies No results for input(s): "TSH", "T4TOTAL", "T3FREE", "THYROIDAB" in the last 72 hours.  Invalid input(s): "FREET3" Urinalysis    Component Value Date/Time   COLORURINE YELLOW 04/12/2023 2046   APPEARANCEUR HAZY (A) 04/12/2023 2046   LABSPEC 1.010 04/12/2023 2046   PHURINE 6.0 04/12/2023 2046   GLUCOSEU 50 (A) 04/12/2023 2046   HGBUR LARGE (A) 04/12/2023 2046   BILIRUBINUR NEGATIVE 04/12/2023 2046   KETONESUR NEGATIVE 04/12/2023 2046   PROTEINUR 30 (A) 04/12/2023 2046   NITRITE NEGATIVE 04/12/2023 2046   LEUKOCYTESUR MODERATE (A) 04/12/2023 2046    FURTHER DISCHARGE INSTRUCTIONS:   Get Medicines reviewed and adjusted: Please take all your medications with you for your next visit with your Primary MD   Laboratory/radiological data: Please request  your Primary MD to go over all hospital tests and procedure/radiological results at the follow up, please ask your Primary MD to get all Hospital records sent to his/her office.   In some cases, they will be blood work, cultures and biopsy results pending at the time of your discharge. Please request that your primary care M.D. goes through all the records of your hospital data and follows up on these results.   Also Note the following: If you experience worsening of your admission symptoms, develop shortness of breath, life threatening emergency, suicidal or homicidal thoughts you must seek medical attention immediately by calling 911 or calling your MD immediately  if symptoms less severe.   You must read complete instructions/literature along with all the possible adverse reactions/side effects for all the Medicines you take and that have been prescribed to you. Take any new Medicines after you have completely understood and accpet all the possible adverse reactions/side effects.    Do not drive when taking Pain medications or sleeping medications (Benzodaizepines)   Do not take more than prescribed Pain, Sleep and Anxiety Medications. It is not advisable to combine anxiety,sleep and pain medications without talking with your primary care practitioner   Special Instructions: If you have smoked or chewed Tobacco  in the last 2 yrs please stop smoking, stop any regular Alcohol  and or any Recreational drug use.   Wear Seat belts while driving.   Please note: You were cared for by a hospitalist during your hospital stay. Once you are discharged, your primary care physician will handle any further medical issues. Please note that NO REFILLS for any discharge medications will be authorized once you are discharged, as it is imperative that you return to your primary care physician (  or establish a relationship with a primary care physician if you do not have one) for your post hospital discharge needs  so that they can reassess your need for medications and monitor your lab values.  Time coordinating discharge: 35 minutes  SIGNED:  Pamella Pert, MD, PhD 04/17/2023, 8:27 AM

## 2023-04-17 NOTE — TOC Transition Note (Addendum)
Transition of Care [facility]) - Discharge Note   Patient Details  Name: Eddie Graham MRN: [id number] Date of Birth: 1934-04-29  Transition of Care [facility]) CM/SW Contact:  Ronny Bacon, RN Phone Number: 04/17/2023, 8:55 AM   Clinical Narrative:   Patient is being discharged today. Spoke with Vaughan Basta [facility]) to inform of patient being discharged.   0945: Secure message from floor nurse received. Patient does not have oxygen tank at bedside for discharge. Call to [facility]) to check on status of portable oxygen tank for discharge. Per Vaughan Basta, driver was notified with first phone call that patient needed O2 tank for home transport. Floor nurse updated.    Final next level of care: Home w Home Health Services Barriers to Discharge: No Barriers Identified   Patient Goals and CMS Choice Patient states their goals for this hospitalization and ongoing recovery are:: Patient wants to return home   Choice offered to / list presented to : Patient, Adult Children      Discharge Placement                       Discharge Plan and Services Additional resources added to the After Visit Summary for     Discharge Planning Services: CM Consult Post Acute Care Choice: Home Health          DME Arranged: N/A DME Agency: NA       HH Arranged: RN, PT HH Agency: [facility] Date [facility] Agency Contacted: 04/12/23 Time HH Agency Contacted: [phone] Representative spoke with at [facility] Agency: Marylene Land  Social Drivers of Health (SDOH) Interventions SDOH Screenings   Food Insecurity: Food Insecurity Present (04/12/2023)  Housing: Low Risk  (04/12/2023)  Recent Concern: Housing - High Risk (03/01/2023)  Transportation Needs: No Transportation Needs (04/12/2023)  Recent Concern: Transportation Needs - Unmet Transportation Needs (03/02/2023)   Received from [facility]  Utilities: Not At Risk (04/12/2023)  Financial Resource Strain: Low Risk  (08/29/2021)  Tobacco Use:  Low Risk  (04/15/2023)     Readmission Risk Interventions     No data to display

## 2023-04-17 NOTE — Plan of Care (Signed)

## 2023-04-17 NOTE — Progress Notes (Signed)
   Patient Name: Eddie Graham Date of Encounter: 04/17/2023 [facility] Cardiologist: Rollene Rotunda, MD   Interval Summary  .    COmplains of full sensation in neck/throat  Difficult to breathe   COmfortable at rest  Vital Signs .    Vitals:   04/16/23 1653 04/16/23 1928 04/16/23 2355 04/17/23 0459  BP: 124/79 115/67 125/68 109/62  Pulse: 65 69 67 69  Resp: (!) 23 20 (!) 25 19  Temp: 98.2 F (36.8 C) 98.7 F (37.1 C) 98.6 F (37 C) 97.9 F (36.6 C)  TempSrc: Oral Oral Oral Oral  SpO2: 97% 97% 94% 100%  Weight:    95.4 kg  Height:        Intake/Output Summary (Last 24 hours) at 04/17/2023 0739 Last data filed at 04/17/2023 0502 Gross per 24 hour  Intake 240 ml  Output 1100 ml  Net -860 ml      04/17/2023    4:59 AM 04/16/2023    6:04 AM 04/15/2023    4:50 AM  Last 3 Weights  Weight (lbs) 210 lb 5.1 oz 211 lb 4.8 oz 210 lb 6.4 oz  Weight (kg) 95.4 kg 95.845 kg 95.437 kg      Telemetry/ECG    SR - Personally Reviewed  Physical Exam .   GEN: No acute distress, remains on 4L [place] Neck: JVP is normal  Cardiac: RRR, no murmurs Respiratory: Clear to auscultation  Ext : No edema  Assessment & Plan .     Jasiah Vinas is a 88 y.o. male with a hx of chronic heart failure with reduced ejection fraction (EF 40-45% on 01/27/2023), coronary artery disease s/p CABG in 2011, paroxysmal atrial fibrillation/atrial flutter, AAA, sigmoid colon abscess s/p end colostomy, and chronic kidney disease IIIb who was seen for the evaluation of chest pain.   CAD s/p CABG Chest pain -- Patient presented to the ED with acute on chronic chest pain. EKG with chronic RBBB, LAFB, and no acute ischemic changes. Troponin 25->25. Chest pain seems atypical in nature, was tender with palpation, not consistent with ACS.  No CP now    -- no ASA with need for [facility], continue statin, Toprol XL   Paroxysmal atrial fibrillation/flutter -- successful TEE/DCCV  04/15/23 -- continue Toprol XL 50mg  BID, consolidate to 100mg  daily   -- Continue Eliquis 2.5mg  BID   Acute on chronic HFrEF Acute on chronic hypoxic respiratory failure -- presented with volume overload symptoms with bilateral pleural effusions and mild pulmonary edema on imaging.  -- BNP 602.3. Last echo from October 2024 showed LVEF 40-45% with global hypokinesis. Echo this admission with drop in LVEF to 25-30%, global hypokinesis, moderately reduced RV. 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 Resume jardiance today   CKD IIIb -- remains stable around 1.6-1.7 -- follow BMET   Per primary team DM Abd pain- improved after large amount of output from colostomy yesterday BPH COPD HLD   OK to d/c from cardiac standpoint   Will make sure he has follow up For questions or updates, please contact [facility] Please consult [email or web address] for contact info under        Signed, Dietrich Pates, MD

## 2023-04-22 ENCOUNTER — Emergency Department (HOSPITAL_COMMUNITY): Payer: 59

## 2023-04-22 ENCOUNTER — Other Ambulatory Visit: Payer: Self-pay

## 2023-04-22 ENCOUNTER — Inpatient Hospital Stay (HOSPITAL_COMMUNITY)
Admission: EM | Admit: 2023-04-22 | Discharge: 2023-05-03 | DRG: 291 | Disposition: A | Discharge status: Home Health | Payer: 59 | Attending: Family Medicine | Admitting: Family Medicine

## 2023-04-22 ENCOUNTER — Encounter (HOSPITAL_COMMUNITY): Payer: Self-pay | Admitting: Emergency Medicine

## 2023-04-22 DIAGNOSIS — D631 Anemia in chronic kidney disease: Secondary | ICD-10-CM | POA: Diagnosis present

## 2023-04-22 DIAGNOSIS — E1165 Type 2 diabetes mellitus with hyperglycemia: Secondary | ICD-10-CM | POA: Diagnosis not present

## 2023-04-22 DIAGNOSIS — Z951 Presence of aortocoronary bypass graft: Secondary | ICD-10-CM

## 2023-04-22 DIAGNOSIS — Z9079 Acquired absence of other genital organ(s): Secondary | ICD-10-CM

## 2023-04-22 DIAGNOSIS — I4892 Unspecified atrial flutter: Secondary | ICD-10-CM | POA: Diagnosis present

## 2023-04-22 DIAGNOSIS — J4489 Other specified chronic obstructive pulmonary disease: Secondary | ICD-10-CM | POA: Diagnosis present

## 2023-04-22 DIAGNOSIS — Z8679 Personal history of other diseases of the circulatory system: Secondary | ICD-10-CM

## 2023-04-22 DIAGNOSIS — J9811 Atelectasis: Secondary | ICD-10-CM | POA: Diagnosis present

## 2023-04-22 DIAGNOSIS — I480 Paroxysmal atrial fibrillation: Secondary | ICD-10-CM | POA: Diagnosis present

## 2023-04-22 DIAGNOSIS — Z91199 Patient's noncompliance with other medical treatment and regimen due to unspecified reason: Secondary | ICD-10-CM | POA: Diagnosis not present

## 2023-04-22 DIAGNOSIS — I130 Hypertensive heart and chronic kidney disease with heart failure and stage 1 through stage 4 chronic kidney disease, or unspecified chronic kidney disease: Principal | ICD-10-CM | POA: Diagnosis present

## 2023-04-22 DIAGNOSIS — R531 Weakness: Secondary | ICD-10-CM | POA: Diagnosis present

## 2023-04-22 DIAGNOSIS — N179 Acute kidney failure, unspecified: Secondary | ICD-10-CM | POA: Diagnosis not present

## 2023-04-22 DIAGNOSIS — I2510 Atherosclerotic heart disease of native coronary artery without angina pectoris: Secondary | ICD-10-CM | POA: Diagnosis present

## 2023-04-22 DIAGNOSIS — Z7984 Long term (current) use of oral hypoglycemic drugs: Secondary | ICD-10-CM

## 2023-04-22 DIAGNOSIS — J9621 Acute and chronic respiratory failure with hypoxia: Secondary | ICD-10-CM | POA: Diagnosis present

## 2023-04-22 DIAGNOSIS — D509 Iron deficiency anemia, unspecified: Secondary | ICD-10-CM | POA: Diagnosis present

## 2023-04-22 DIAGNOSIS — N1832 Chronic kidney disease, stage 3b: Secondary | ICD-10-CM | POA: Diagnosis present

## 2023-04-22 DIAGNOSIS — Z91148 Patient's other noncompliance with medication regimen for other reason: Secondary | ICD-10-CM

## 2023-04-22 DIAGNOSIS — Z7901 Long term (current) use of anticoagulants: Secondary | ICD-10-CM

## 2023-04-22 DIAGNOSIS — Z9981 Dependence on supplemental oxygen: Secondary | ICD-10-CM

## 2023-04-22 DIAGNOSIS — I509 Heart failure, unspecified: Secondary | ICD-10-CM | POA: Diagnosis present

## 2023-04-22 DIAGNOSIS — I5023 Acute on chronic systolic (congestive) heart failure: Secondary | ICD-10-CM | POA: Diagnosis present

## 2023-04-22 DIAGNOSIS — I714 Abdominal aortic aneurysm, without rupture, unspecified: Secondary | ICD-10-CM | POA: Diagnosis present

## 2023-04-22 DIAGNOSIS — I5021 Acute systolic (congestive) heart failure: Secondary | ICD-10-CM | POA: Diagnosis not present

## 2023-04-22 DIAGNOSIS — E785 Hyperlipidemia, unspecified: Secondary | ICD-10-CM | POA: Diagnosis present

## 2023-04-22 DIAGNOSIS — Z794 Long term (current) use of insulin: Secondary | ICD-10-CM

## 2023-04-22 DIAGNOSIS — K219 Gastro-esophageal reflux disease without esophagitis: Secondary | ICD-10-CM | POA: Diagnosis present

## 2023-04-22 DIAGNOSIS — F419 Anxiety disorder, unspecified: Secondary | ICD-10-CM | POA: Diagnosis present

## 2023-04-22 DIAGNOSIS — G4700 Insomnia, unspecified: Secondary | ICD-10-CM | POA: Diagnosis present

## 2023-04-22 DIAGNOSIS — E1122 Type 2 diabetes mellitus with diabetic chronic kidney disease: Secondary | ICD-10-CM | POA: Diagnosis present

## 2023-04-22 DIAGNOSIS — Z8551 Personal history of malignant neoplasm of bladder: Secondary | ICD-10-CM

## 2023-04-22 DIAGNOSIS — E876 Hypokalemia: Secondary | ICD-10-CM | POA: Diagnosis not present

## 2023-04-22 DIAGNOSIS — Z79899 Other long term (current) drug therapy: Secondary | ICD-10-CM

## 2023-04-22 LAB — CBC WITH DIFFERENTIAL/PLATELET
Abs Immature Granulocytes: 0.05 10*3/uL (ref 0.00–0.07)
Basophils Absolute: 0 10*3/uL (ref 0.0–0.1)
Basophils Relative: 0 %
Eosinophils Absolute: 0.2 10*3/uL (ref 0.0–0.5)
Eosinophils Relative: 3 %
HCT: 47.3 % (ref 39.0–52.0)
Hemoglobin: 14.7 g/dL (ref 13.0–17.0)
Immature Granulocytes: 1 %
Lymphocytes Relative: 15 %
Lymphs Abs: 1.2 10*3/uL (ref 0.7–4.0)
MCH: 27.9 pg (ref 26.0–34.0)
MCHC: 31.1 g/dL (ref 30.0–36.0)
MCV: 89.8 fL (ref 80.0–100.0)
Monocytes Absolute: 0.6 10*3/uL (ref 0.1–1.0)
Monocytes Relative: 7 %
Neutro Abs: 5.6 10*3/uL (ref 1.7–7.7)
Neutrophils Relative %: 74 %
Platelets: 197 10*3/uL (ref 150–400)
RBC: 5.27 MIL/uL (ref 4.22–5.81)
RDW: 15 % (ref 11.5–15.5)
WBC: 7.7 10*3/uL (ref 4.0–10.5)
nRBC: 0 % (ref 0.0–0.2)

## 2023-04-22 LAB — GLUCOSE, CAPILLARY: Glucose-Capillary: 242 mg/dL — ABNORMAL HIGH (ref 70–99)

## 2023-04-22 LAB — COMPREHENSIVE METABOLIC PANEL
ALT: 19 U/L (ref 0–44)
AST: 17 U/L (ref 15–41)
Albumin: 3 g/dL — ABNORMAL LOW (ref 3.5–5.0)
Alkaline Phosphatase: 68 U/L (ref 38–126)
Anion gap: 9 (ref 5–15)
BUN: 16 mg/dL (ref 8–23)
CO2: 26 mmol/L (ref 22–32)
Calcium: 8.6 mg/dL — ABNORMAL LOW (ref 8.9–10.3)
Chloride: 104 mmol/L (ref 98–111)
Creatinine, Ser: 1.21 mg/dL (ref 0.61–1.24)
GFR, Estimated: 58 mL/min — ABNORMAL LOW (ref >60)
Glucose, Bld: 139 mg/dL — ABNORMAL HIGH (ref 70–99)
Potassium: 3.4 mmol/L — ABNORMAL LOW (ref 3.5–5.1)
Sodium: 139 mmol/L (ref 135–145)
Total Bilirubin: 1.2 mg/dL — ABNORMAL HIGH (ref <1.2)
Total Protein: 6.4 g/dL — ABNORMAL LOW (ref 6.5–8.1)

## 2023-04-22 LAB — RESP PANEL BY RT-PCR (RSV, FLU A&B, COVID)  RVPGX2
Influenza A by PCR: NEGATIVE
Influenza B by PCR: NEGATIVE
Resp Syncytial Virus by PCR: NEGATIVE
SARS Coronavirus 2 by RT PCR: NEGATIVE

## 2023-04-22 LAB — BRAIN NATRIURETIC PEPTIDE: B Natriuretic Peptide: 841.2 pg/mL — ABNORMAL HIGH (ref 0.0–100.0)

## 2023-04-22 LAB — CBG MONITORING, ED: Glucose-Capillary: 138 mg/dL — ABNORMAL HIGH (ref 70–99)

## 2023-04-22 LAB — D-DIMER, QUANTITATIVE: D-Dimer, Quant: 0.8 ug{FEU}/mL — ABNORMAL HIGH (ref 0.00–0.50)

## 2023-04-22 LAB — TROPONIN I (HIGH SENSITIVITY)
Troponin I (High Sensitivity): 23 ng/L — ABNORMAL HIGH (ref <18)
Troponin I (High Sensitivity): 19 ng/L — ABNORMAL HIGH (ref <18)

## 2023-04-22 MED ORDER — ACETAMINOPHEN 325 MG PO TABS
650.0000 mg | ORAL_TABLET | ORAL | Status: DC | PRN
Start: 2023-04-22 — End: 2023-05-03
  Administered 2023-04-23 – 2023-05-02 (×13): 650 mg via ORAL
  Filled 2023-04-22 (×13): qty 2

## 2023-04-22 MED ORDER — INSULIN ASPART 100 UNIT/ML IJ SOLN
0.0000 [IU] | Freq: Three times a day (TID) | INTRAMUSCULAR | Status: DC
  Administered 2023-04-23: 5 [IU] via SUBCUTANEOUS
  Administered 2023-04-23: 1 [IU] via SUBCUTANEOUS
  Administered 2023-04-23: 3 [IU] via SUBCUTANEOUS
  Administered 2023-04-24: 5 [IU] via SUBCUTANEOUS
  Administered 2023-04-24: 3 [IU] via SUBCUTANEOUS
  Administered 2023-04-24: 5 [IU] via SUBCUTANEOUS
  Administered 2023-04-25 (×3): 3 [IU] via SUBCUTANEOUS
  Administered 2023-04-26 (×2): 5 [IU] via SUBCUTANEOUS
  Administered 2023-04-26 – 2023-04-27 (×2): 3 [IU] via SUBCUTANEOUS
  Administered 2023-04-27: 5 [IU] via SUBCUTANEOUS
  Administered 2023-04-27: 2 [IU] via SUBCUTANEOUS
  Administered 2023-04-28: 3 [IU] via SUBCUTANEOUS
  Administered 2023-04-28 (×2): 2 [IU] via SUBCUTANEOUS
  Administered 2023-04-29: 3 [IU] via SUBCUTANEOUS
  Administered 2023-04-29: 2 [IU] via SUBCUTANEOUS
  Administered 2023-04-29 – 2023-04-30 (×3): 3 [IU] via SUBCUTANEOUS
  Administered 2023-04-30: 2 [IU] via SUBCUTANEOUS
  Administered 2023-05-01: 3 [IU] via SUBCUTANEOUS
  Administered 2023-05-01: 2 [IU] via SUBCUTANEOUS
  Administered 2023-05-01: 3 [IU] via SUBCUTANEOUS
  Administered 2023-05-02: 2 [IU] via SUBCUTANEOUS
  Administered 2023-05-02: 5 [IU] via SUBCUTANEOUS
  Administered 2023-05-02 – 2023-05-03 (×2): 2 [IU] via SUBCUTANEOUS
  Administered 2023-05-03: 3 [IU] via SUBCUTANEOUS

## 2023-04-22 MED ORDER — SODIUM CHLORIDE 0.9 % IV SOLN
250.0000 mL | INTRAVENOUS | Status: DC | PRN
Start: 2023-04-22 — End: 2023-04-23

## 2023-04-22 MED ORDER — FUROSEMIDE 10 MG/ML IJ SOLN
20.0000 mg | Freq: Once | INTRAMUSCULAR | Status: AC
  Administered 2023-04-22: 20 mg via INTRAVENOUS
  Filled 2023-04-22: qty 2

## 2023-04-22 MED ORDER — ASPIRIN 81 MG PO CHEW
324.0000 mg | CHEWABLE_TABLET | Freq: Once | ORAL | Status: AC
Start: 2023-04-22 — End: 2023-04-22
  Administered 2023-04-22: 324 mg via ORAL
  Filled 2023-04-22: qty 4

## 2023-04-22 MED ORDER — MELATONIN 5 MG PO TABS
5.0000 mg | ORAL_TABLET | Freq: Every evening | ORAL | Status: DC | PRN
  Administered 2023-04-22 – 2023-05-02 (×7): 5 mg via ORAL
  Filled 2023-04-22 (×7): qty 1

## 2023-04-22 MED ORDER — UMECLIDINIUM-VILANTEROL 62.5-25 MCG/ACT IN AEPB
1.0000 | INHALATION_SPRAY | Freq: Every day | RESPIRATORY_TRACT | Status: DC
  Administered 2023-04-23 – 2023-05-03 (×10): 1 via RESPIRATORY_TRACT
  Filled 2023-04-22 (×3): qty 14

## 2023-04-22 MED ORDER — GABAPENTIN 300 MG PO CAPS
300.0000 mg | ORAL_CAPSULE | Freq: Every evening | ORAL | Status: DC | PRN
  Administered 2023-04-27 – 2023-05-02 (×5): 300 mg via ORAL
  Filled 2023-04-22: qty 1
  Filled 2023-04-22: qty 3
  Filled 2023-04-22: qty 1
  Filled 2023-04-22 (×2): qty 3
  Filled 2023-04-22: qty 1

## 2023-04-22 MED ORDER — ONDANSETRON HCL 4 MG/2ML IJ SOLN: 4.0000 mg | Freq: Four times a day (QID) | INTRAMUSCULAR | Status: DC | PRN

## 2023-04-22 MED ORDER — APIXABAN 2.5 MG PO TABS
2.5000 mg | ORAL_TABLET | Freq: Two times a day (BID) | ORAL | Status: DC
  Administered 2023-04-22 – 2023-04-23 (×2): 2.5 mg via ORAL
  Filled 2023-04-22 (×2): qty 1

## 2023-04-22 MED ORDER — FUROSEMIDE 10 MG/ML IJ SOLN
80.0000 mg | Freq: Two times a day (BID) | INTRAMUSCULAR | Status: DC
  Administered 2023-04-22 – 2023-04-24 (×5): 80 mg via INTRAVENOUS
  Filled 2023-04-22 (×5): qty 8

## 2023-04-22 MED ORDER — TRAMADOL HCL 50 MG PO TABS
50.0000 mg | ORAL_TABLET | Freq: Four times a day (QID) | ORAL | Status: DC | PRN
  Administered 2023-04-22 – 2023-05-03 (×18): 50 mg via ORAL
  Filled 2023-04-22 (×18): qty 1

## 2023-04-22 MED ORDER — SODIUM CHLORIDE 0.9% FLUSH
3.0000 mL | INTRAVENOUS | Status: DC | PRN
Start: 2023-04-22 — End: 2023-04-23

## 2023-04-22 MED ORDER — SEMAGLUTIDE 14 MG PO TABS: 14.0000 mg | ORAL_TABLET | Freq: Every day | ORAL | Status: DC

## 2023-04-22 MED ORDER — DULOXETINE HCL 60 MG PO CPEP
60.0000 mg | ORAL_CAPSULE | Freq: Every day | ORAL | Status: DC
  Administered 2023-04-23 – 2023-05-03 (×11): 60 mg via ORAL
  Filled 2023-04-22 (×4): qty 2
  Filled 2023-04-22: qty 1
  Filled 2023-04-22 (×2): qty 2
  Filled 2023-04-22 (×2): qty 1
  Filled 2023-04-22 (×2): qty 2

## 2023-04-22 MED ORDER — INSULIN ASPART 100 UNIT/ML IJ SOLN
0.0000 [IU] | Freq: Every day | INTRAMUSCULAR | Status: DC
  Administered 2023-04-22 – 2023-04-25 (×4): 2 [IU] via SUBCUTANEOUS
  Administered 2023-04-27: 3 [IU] via SUBCUTANEOUS
  Administered 2023-04-28: 2 [IU] via SUBCUTANEOUS
  Administered 2023-04-29: 3 [IU] via SUBCUTANEOUS
  Administered 2023-04-30 – 2023-05-01 (×2): 2 [IU] via SUBCUTANEOUS
  Administered 2023-05-02: 3 [IU] via SUBCUTANEOUS

## 2023-04-22 MED ORDER — EMPAGLIFLOZIN 10 MG PO TABS
10.0000 mg | ORAL_TABLET | Freq: Every day | ORAL | Status: DC
  Administered 2023-04-23 – 2023-05-03 (×11): 10 mg via ORAL
  Filled 2023-04-22 (×11): qty 1

## 2023-04-22 MED ORDER — SODIUM CHLORIDE 0.9% FLUSH
3.0000 mL | Freq: Two times a day (BID) | INTRAVENOUS | Status: DC
  Administered 2023-04-22 – 2023-04-23 (×2): 3 mL via INTRAVENOUS

## 2023-04-22 MED ORDER — FERROUS SULFATE 325 (65 FE) MG PO TABS
325.0000 mg | ORAL_TABLET | Freq: Every day | ORAL | Status: DC
  Administered 2023-04-23 – 2023-05-03 (×11): 325 mg via ORAL
  Filled 2023-04-22 (×11): qty 1

## 2023-04-22 MED ORDER — IPRATROPIUM-ALBUTEROL 0.5-2.5 (3) MG/3ML IN SOLN
3.0000 mL | Freq: Once | RESPIRATORY_TRACT | Status: AC
  Administered 2023-04-22: 3 mL via RESPIRATORY_TRACT
  Filled 2023-04-22: qty 3

## 2023-04-22 MED ORDER — PANTOPRAZOLE SODIUM 40 MG PO TBEC
40.0000 mg | DELAYED_RELEASE_TABLET | Freq: Two times a day (BID) | ORAL | Status: DC
  Administered 2023-04-22 – 2023-05-03 (×22): 40 mg via ORAL
  Filled 2023-04-22 (×22): qty 1

## 2023-04-22 MED ORDER — TRAZODONE HCL 50 MG PO TABS
50.0000 mg | ORAL_TABLET | Freq: Every day | ORAL | Status: DC
  Administered 2023-04-22 – 2023-05-02 (×11): 50 mg via ORAL
  Filled 2023-04-22 (×11): qty 1

## 2023-04-22 NOTE — ED Provider Notes (Signed)
Patient initially seen by Dr. Gwenlyn Fudge.  Please see his notes.  Patient presents to the ED with complaints of recurrent shortness of breath.  Patient has complex medical history of COPD CHF.  There is question of him understanding his medical care as when EMS arrived they noted he was not on any oxygen his saturation was in the 70s.  Patient noted to have increased work of breathing.  His laboratory workup is suggestive of CHF exacerbation with elevated BNP and an x-ray with atelectasis and effusion.  I will consult with medical service for admission.  Patient may need to have some discussions about his home situation considering his recurrent hospitalizations in the last several months  Caes discussed with Dr Natale Milch.  Will see pt in consultation for possible Dell Ponto, MD 04/22/23 1732

## 2023-04-22 NOTE — ED Notes (Signed)
ED TO INPATIENT HANDOFF REPORT  ED Nurse Name and Phone #: Gillis Ends [phone]  S Name/Age/Gender Eddie Graham 88 y.o. male Room/Bed: 029C/029C  Code Status   Code Status: Full Code  Home/SNF/Other Home Patient oriented to: self, place, time, and situation Is this baseline? Yes   Triage Complete: Triage complete  Chief Complaint Acute exacerbation of congestive heart failure (HCC) [I50.9]  Triage Note Pt to ER via EMS from home with reports of SHOB and CP.  When fire arrived pt was ambulatory at home without O2, sats noted in high 70s, [place] 3L placed, sats recovered to high 90s.  Pt continued to c/o CP.  Pt with significant medical history.   Allergies No Known Allergies  Level of Care/Admitting Diagnosis ED Disposition     ED Disposition  Admit   Condition  --   Comment  Hospital Area: [facility] [[id number]]  Level of Care: Progressive [102]  Admit to Progressive based on following criteria: CARDIOVASCULAR & THORACIC of moderate stability with acute coronary syndrome symptoms/low risk myocardial infarction/hypertensive urgency/arrhythmias/heart failure potentially compromising stability and stable post cardiovascular intervention patients.  May admit patient to [facility] or [facility] if equivalent level of care is available:: No  Covid Evaluation: Confirmed COVID Negative  Diagnosis: Acute exacerbation of congestive heart failure [facility]) [[id number]]  Admitting Physician: Azucena Fallen [[id number]]  Attending Physician: Azucena Fallen [[id number]]  Certification:: I certify this patient will need inpatient services for at least 2 midnights          B Medical/Surgery History Past Medical History:  Diagnosis Date   AAA (abdominal aortic aneurysm) (HCC)    Anemia    Anxiety    Arthritis    Asthma    Back pain    Cancer (HCC)    Chronic kidney disease    COPD (chronic obstructive pulmonary disease) (HCC)    Depression    Diabetes  mellitus without complication (HCC)    Dyspnea    Hypertension    Insomnia    Past Surgical History:  Procedure Laterality Date   BOWEL RESECTION     CARDIAC SURGERY     CARDIOVERSION N/A 04/15/2023   Procedure: CARDIOVERSION;  Surgeon: Jake Bathe, MD;  Location: [facility] INVASIVE CV LAB;  Service: Cardiovascular;  Laterality: N/A;   CYSTOSCOPY WITH URETHRAL DILATATION N/A 09/30/2021   Procedure: CYSTOSCOPY WITH  URETHRAL BALLOON  DILATATION;  Surgeon: Noel Christmas, MD;  Location: [facility] ORS;  Service: Urology;  Laterality: N/A;   CYSTOSCOPY WITH URETHRAL DILATATION N/A 12/09/2021   Procedure: CYSTOSCOPY WITH URETHRAL DILATATION;  Surgeon: Noel Christmas, MD;  Location: [facility] OR[facility];  Service: Urology;  Laterality: N/A;  1 HR   CYSTOSCOPY WITH URETHRAL DILATATION N/A 02/09/2023   Procedure: CYSTOSCOPY WITH URETHRAL DILATATION;  Surgeon: Noel Christmas, MD;  Location: [facility];  Service: Urology;  Laterality: N/A;   PROSTATE SURGERY     TRANSESOPHAGEAL ECHOCARDIOGRAM (CATH LAB) N/A 04/15/2023   Procedure: TRANSESOPHAGEAL ECHOCARDIOGRAM;  Surgeon: Jake Bathe, MD;  Location: [facility] INV[facility] CV [facility];  Service: Cardiovascular;  Laterality: N/A;   TRANSURETHRAL RESECTION OF BLADDER TUMOR N/A 09/30/2021   Procedure: TRANSURETHRAL RESECTION OF BLADDER TUMOR (TURBT);  Surgeon: Noel Christmas, MD;  Location: [facility];  Service: Urology;  Laterality: N/A;  90 MINS   TRANSURETHRAL RESECTION OF BLADDER TUMOR N/A 02/09/2023   Procedure: TRANSURETHRAL RESECTION OF BLADDER TUMOR (TURBT);  Surgeon: Noel Christmas, MD;  Location: [facility];  Service: Urology;  Laterality: N/A;     A IV Location/Drains/Wounds Patient Lines/Drains/Airways Status     Active Line/Drains/Airways     Name Placement date Placement time Site Days   Peripheral IV 04/22/23 Anterior;Distal;Left Forearm 04/22/23  1420  Forearm  less than 1   Colostomy LLQ 06/13/22  1026  LLQ  313            Intake/Output Last 24 hours No  intake or output data in the 24 hours ending 04/22/23 2020  Labs/Imaging Results for orders placed or performed during the hospital encounter of 04/22/23 (from the past 48 hours)  Comprehensive metabolic panel     Status: Abnormal   Collection Time: 04/22/23  2:45 PM  Result Value Ref Range   Sodium 139 135 - 145 mmol/L   Potassium 3.4 (L) 3.5 - 5.1 mmol/L   Chloride 104 98 - 111 mmol/L   CO2 26 22 - 32 mmol/L   Glucose, Bld 139 (H) 70 - 99 mg/dL    Comment: Glucose reference range applies only to samples taken after fasting for at least 8 hours.   BUN 16 8 - 23 mg/dL   Creatinine, Ser 4.09 0.61 - 1.24 mg/dL   Calcium 8.6 (L) 8.9 - 10.3 mg/dL   Total Protein 6.4 (L) 6.5 - 8.1 g/dL   Albumin 3.0 (L) 3.5 - 5.0 g/dL   AST 17 15 - 41 U/L   ALT 19 0 - 44 U/L   Alkaline Phosphatase 68 38 - 126 U/L   Total Bilirubin 1.2 (H) <1.2 mg/dL   GFR, Estimated 58 (L) >60 mL/min    Comment: (NOTE) Calculated using the CKD-EPI Creatinine Equation (2021)    Anion gap 9 5 - 15    Comment: Performed at [facility] Lab, [address], [place], [place] [address]  Troponin I (High Sensitivity)     Status: Abnormal   Collection Time: 04/22/23  2:45 PM  Result Value Ref Range   Troponin I (High Sensitivity) 23 (H) <18 ng/L    Comment: (NOTE) Elevated high sensitivity troponin I (hsTnI) values and significant  changes across serial measurements may suggest ACS but many other  chronic and acute conditions are known to elevate hsTnI results.  Refer to the "Links" section for chest pain algorithms and additional  guidance. Performed at [facility], [address], [place], [place] [address]   Brain natriuretic peptide     Status: Abnormal   Collection Time: 04/22/23  2:45 PM  Result Value Ref Range   B Natriuretic Peptide 841.2 (H) 0.0 - 100.0 pg/mL    Comment: Performed at [facility] Lab, [address], [place], [place] [address]  CBC with Differential     Status: None   Collection  Time: 04/22/23  2:45 PM  Result Value Ref Range   WBC 7.7 4.0 - 10.5 K/uL   RBC 5.27 4.22 - 5.81 MIL/uL   Hemoglobin 14.7 13.0 - 17.0 g/dL   HCT 08.6 57.8 - 46.9 %   MCV 89.8 80.0 - 100.0 fL   MCH 27.9 26.0 - 34.0 pg   MCHC 31.1 30.0 - 36.0 g/dL   RDW 62.9 52.8 - 41.3 %   Platelets 197 150 - 400 K/uL   nRBC 0.0 0.0 - 0.2 %   Neutrophils Relative % 74 %   Neutro Abs 5.6 1.7 - 7.7 K/uL   Lymphocytes Relative 15 %   Lymphs Abs 1.2 0.7 - 4.0 K/uL   Monocytes Relative  7 %   Monocytes Absolute 0.6 0.1 - 1.0 K/uL   Eosinophils Relative 3 %   Eosinophils Absolute 0.2 0.0 - 0.5 K/uL   Basophils Relative 0 %   Basophils Absolute 0.0 0.0 - 0.1 K/uL   Immature Granulocytes 1 %   Abs Immature Granulocytes 0.05 0.00 - 0.07 K/uL    Comment: Performed at [facility] Lab, [address] [place], [place] [address]  Resp panel by RT-PCR (RSV, Flu A&B, Covid) Anterior Nasal Swab     Status: None   Collection Time: 04/22/23  2:45 PM   Specimen: Anterior Nasal Swab  Result Value Ref Range   SARS Coronavirus 2 by RT PCR NEGATIVE NEGATIVE   Influenza A by PCR NEGATIVE NEGATIVE   Influenza B by PCR NEGATIVE NEGATIVE    Comment: (NOTE) The Xpert Xpress SARS-CoV-2/FLU/RSV plus assay is intended as an aid in the diagnosis of influenza from Nasopharyngeal swab specimens and should not be used as a sole basis for treatment. Nasal washings and aspirates are unacceptable for Xpert Xpress SARS-CoV-2/FLU/RSV testing.  Fact Sheet for Patients: BloggerCourse.com  Fact Sheet for Healthcare Providers: SeriousBroker.it  This test is not yet approved or cleared by the [place] FDA and has been authorized for detection and/or diagnosis of SARS-CoV-2 by FDA under an Emergency Use Authorization (EUA). This EUA will remain in effect (meaning this test can be used) for the duration of the COVID-19 declaration under Section 564(b)(1) of the Act, 21  U.S.C. section 360bbb-3(b)(1), unless the authorization is terminated or revoked.     Resp Syncytial Virus by PCR NEGATIVE NEGATIVE    Comment: (NOTE) Fact Sheet for Patients: BloggerCourse.com  Fact Sheet for Healthcare Providers: SeriousBroker.it  This test is not yet approved or cleared by the [place] FDA and has been authorized for detection and/or diagnosis of SARS-CoV-2 by FDA under an Emergency Use Authorization (EUA). This EUA will remain in effect (meaning this test can be used) for the duration of the COVID-19 declaration under Section 564(b)(1) of the Act, 21 U.S.C. section 360bbb-3(b)(1), unless the authorization is terminated or revoked.  Performed at [facility] Lab, [address], [place], [place] [address]   Troponin I (High Sensitivity)     Status: Abnormal   Collection Time: 04/22/23  4:02 PM  Result Value Ref Range   Troponin I (High Sensitivity) 19 (H) <18 ng/L    Comment: (NOTE) Elevated high sensitivity troponin I (hsTnI) values and significant  changes across serial measurements may suggest ACS but many other  chronic and acute conditions are known to elevate hsTnI results.  Refer to the "Links" section for chest pain algorithms and additional  guidance. Performed at [facility], [address] [place], [place] [address]   D-dimer, quantitative     Status: Abnormal   Collection Time: 04/22/23  4:02 PM  Result Value Ref Range   D-Dimer, Quant 0.80 (H) 0.00 - 0.50 ug/mL-FEU    Comment: (NOTE) At the manufacturer cut-off value of 0.5 g/mL FEU, this assay has a negative predictive value of 95-100%.This assay is intended for use in conjunction with a clinical pretest probability (PTP) assessment model to exclude pulmonary embolism (PE) and deep venous thrombosis (DVT) in outpatients suspected of PE or DVT. Results should be correlated with clinical presentation. Performed at [facility] Lab, [address], [place], [place] [address]   CBG monitoring, ED     Status: Abnormal   Collection Time: 04/22/23  7:46 PM  Result Value Ref Range   Glucose-Capillary 138 (H) 70 - 99 mg/dL    Comment: Glucose reference range applies only to samples taken after fasting for at least 8 hours.   DG Chest 2 View Result Date: 04/22/2023 CLINICAL DATA:  Dyspnea. EXAM: CHEST - 2 VIEW COMPARISON:  April 10, 2023. FINDINGS: Stable cardiomegaly. Sternotomy wires are noted. Mild bibasilar subsegmental atelectasis is noted with small pleural effusions. IMPRESSION: Mild bibasilar subsegmental atelectasis with small bilateral pleural effusions. Electronically Signed   By: Lupita Raider M.D.   On: 04/22/2023 15:41    Pending Labs Unresulted Labs (From admission, onward)     Start     Ordered   04/23/23 0500  Basic metabolic panel  Daily,   R      04/22/23 1914   04/23/23 0500  CBC  Daily,   R      04/22/23 1914            Vitals/Pain Today's Vitals   04/22/23 1303 04/22/23 1311 04/22/23 1615 04/22/23 1703  BP:  (!) 152/73 (!) 165/80 (!) 108/92  Pulse:  69 66 68  Resp:  18 (!) 22 16  Temp:  97.8 F (36.6 C)  (!) 97.5 F (36.4 C)  TempSrc:  Oral  Oral  SpO2:  100% 97% 98%  Weight: 95 kg     Height: 5\' 6"  (1.676 m)     PainSc: 0-No pain       Isolation Precautions No active isolations  Medications Medications  sodium chloride flush (NS) 0.9 % injection 3 mL (has no administration in time range)  sodium chloride flush (NS) 0.9 % injection 3 mL (has no administration in time range)  0.9 %  sodium chloride infusion (has no administration in time range)  acetaminophen (TYLENOL) tablet 650 mg (has no administration in time range)  ondansetron (ZOFRAN) injection 4 mg (has no administration in time range)  furosemide (LASIX) injection 80 mg (has no administration in time range)  umeclidinium-vilanterol (ANORO ELLIPTA) 62.5-25 MCG/ACT 1 puff (has no administration in  time range)  apixaban (ELIQUIS) tablet 2.5 mg (has no administration in time range)  DULoxetine (CYMBALTA) DR capsule 60 mg (has no administration in time range)  empagliflozin (JARDIANCE) tablet 10 mg (has no administration in time range)  ferrous sulfate tablet 325 mg (has no administration in time range)  gabapentin (NEURONTIN) capsule 100-300 mg (has no administration in time range)  Melatonin CAPS 5 mg (has no administration in time range)  pantoprazole (PROTONIX) EC tablet 40 mg (has no administration in time range)  Semaglutide TABS 14 mg (has no administration in time range)  traMADol (ULTRAM) tablet 50 mg (has no administration in time range)  traZODone (DESYREL) tablet 50 mg (has no administration in time range)  insulin aspart (novoLOG) injection 0-9 Units (has no administration in time range)  insulin aspart (novoLOG) injection 0-5 Units (has no administration in time range)  aspirin chewable tablet 324 mg (324 mg Oral Given 04/22/23 1445)  ipratropium-albuterol (DUONEB) 0.5-2.5 (3) MG/3ML nebulizer solution 3 mL (3 mLs Nebulization Given 04/22/23 1445)  furosemide (LASIX) injection 20 mg (20 mg Intravenous Given 04/22/23 1739)    Mobility non-ambulatory     Focused Assessments     R Recommendations: See Admitting Provider Note  Report given to:   Additional Notes:

## 2023-04-22 NOTE — ED Provider Notes (Signed)
[place] EMERGENCY DEPARTMENT AT [facility] Provider Note   CSN: [id number] Arrival date & time: 04/22/23  1255     History  Chief Complaint  Patient presents with   Shortness of Breath   Chest Pain    Eddie Graham is a 88 y.o. male.  HPI 88 year old male presents with shortness of breath.  History is obtained with the help with the Spanish interpreter.  Unfortunately, even with this he is a poor historian.  He is having a lot of symptoms similar to when he was discharged earlier in the week.  He is complaining of shortness of breath, left-sided chest pain.  He states his throat is also sore and has been that way since upon discharge as well.  No neck swelling.  No fevers.  He has a chronic but unchanged cough.  He does not feel like he is having any leg swelling.  He reports compliance with his oxygen but EMS reported that the patient was found without oxygen (he supposed to be on 4 L) and his sats were in the 70s and he was very short of breath.  Seem to improve when placed on oxygen.  Home Medications Prior to Admission medications   Medication Sig Start Date End Date Taking? Authorizing Provider  acetaminophen (TYLENOL) 650 MG CR tablet Take 650 mg by mouth every 8 (eight) hours as needed for pain. 03/26/22   [provider]  albuterol (VENTOLIN HFA) 108 (90 Base) MCG/ACT inhaler Inhale 2 puffs into the lungs every 4 (four) hours as needed for wheezing. 11/11/20   [provider]  Ernestina Patches 62.5-25 MCG/ACT AEPB Inhale 1 puff into the lungs daily.    [provider]  apixaban (ELIQUIS) 2.5 MG TABS tablet Take 1 tablet (2.5 mg total) by mouth 2 (two) times daily. 01/29/23   Marguerita Merles Latif, DO  Carboxymethylcellulose Sodium (EYE DROPS OP) Place 2 drops into both eyes as needed (dryness/itching).    [provider]  DULoxetine (CYMBALTA) 60 MG capsule Take 60 mg by mouth daily. 02/07/21   [provider]   empagliflozin (JARDIANCE) 10 MG TABS tablet Take 1 tablet (10 mg total) by mouth daily before breakfast. 03/01/23   Almon Hercules, MD  ferrous sulfate 325 (65 FE) MG tablet Take 325 mg by mouth daily with breakfast. 08/21/22   [provider]  furosemide (LASIX) 20 MG tablet Take 1 tablet (20 mg total) by mouth daily. 03/08/23   Almon Hercules, MD  gabapentin (NEURONTIN) 100 MG capsule Take 100-300 mg by mouth at bedtime as needed (leg pain). 02/25/21   [provider]  Insulin Pen Needle (TECHLITE PEN NEEDLES) 32G X 4 MM MISC Use to inject Lantus at bedtime as directed 06/15/22   Rhetta Mura, MD  LANTUS SOLOSTAR 100 UNIT/ML Solostar Pen Inject 15 Units into the skin at bedtime. Patient taking differently: Inject 24 Units into the skin at bedtime. 06/15/22   Rhetta Mura, MD  Melatonin 5 MG CAPS Take 5 mg by mouth at bedtime as needed (sleep). 08/16/19   [provider]  metoprolol succinate (TOPROL-XL) 100 MG 24 hr tablet Take 1 tablet (100 mg total) by mouth daily. Take with or immediately following a meal. 01/30/23   Sheikh, Omair Latif, DO  montelukast (SINGULAIR) 10 MG tablet Take 10 mg by mouth at bedtime. 03/19/21   [provider]  pantoprazole (PROTONIX) 40 MG tablet Take 1 tablet by mouth 2 times daily. 06/15/22  Rhetta Mura, MD  polyethylene glycol (MIRALAX / GLYCOLAX) 17 g packet Take 17 g by mouth daily as needed for mild constipation. 03/24/22   [provider]  rosuvastatin (CRESTOR) 40 MG tablet Take 40 mg by mouth at bedtime. 04/07/21   [provider]  Semaglutide 14 MG TABS Take 14 mg by mouth daily. 03/14/21   [provider]  traMADol (ULTRAM) 50 MG tablet Take 1 tablet (50 mg total) by mouth every 6 (six) hours as needed. 03/01/23 02/29/24  Almon Hercules, MD  traZODone (DESYREL) 50 MG tablet Take 50 mg by mouth at bedtime. 03/14/21   [provider]      Allergies    Patient has no known  allergies.    Review of Systems   Review of Systems  Constitutional:  Negative for fever.  HENT:  Positive for sore throat.   Respiratory:  Positive for cough and shortness of breath.   Cardiovascular:  Positive for chest pain. Negative for leg swelling.    Physical Exam Updated Vital Signs BP (!) 152/73   Pulse 69   Temp 97.8 F (36.6 C) (Oral)   Resp 18   Ht 5\' 6"  (1.676 m)   Wt 95 kg   SpO2 100%   BMI 33.80 kg/m  Physical Exam Vitals and nursing note reviewed.  Constitutional:      General: He is not in acute distress.    Appearance: He is well-developed. He is not ill-appearing or diaphoretic.  HENT:     Head: Normocephalic and atraumatic.  Cardiovascular:     Rate and Rhythm: Normal rate and regular rhythm.     Heart sounds: Normal heart sounds.  Pulmonary:     Effort: Pulmonary effort is normal. Tachypnea present. No accessory muscle usage.     Breath sounds: Normal breath sounds. No wheezing or rales.  Abdominal:     Palpations: Abdomen is soft.     Tenderness: There is no abdominal tenderness.  Musculoskeletal:     Right lower leg: No edema.     Left lower leg: No edema.  Skin:    General: Skin is warm and dry.  Neurological:     Mental Status: He is alert.     ED Results / Procedures / Treatments   Labs (all labs ordered are listed, but only abnormal results are displayed) Labs Reviewed  RESP PANEL BY RT-PCR (RSV, FLU A&B, COVID)  [id number]  CBC WITH DIFFERENTIAL/PLATELET  COMPREHENSIVE METABOLIC PANEL  BRAIN NATRIURETIC PEPTIDE  D-DIMER, QUANTITATIVE  TROPONIN I (HIGH SENSITIVITY)  TROPONIN I (HIGH SENSITIVITY)    EKG EKG Interpretation Date/Time:  Thursday April 22 2023 13:11:37 EST Ventricular Rate:  70 PR Interval:  164 QRS Duration:  122 QT Interval:  595 QTC Calculation: 643 R Axis:   -75  Text Interpretation: Sinus rhythm Ventricular premature complex Right bundle branch block Inferior infarct, old Anterior infarct, old Confirmed  by Pricilla Loveless [phone]) on 04/22/2023 1:34:41 PM  Radiology DG Chest 2 View Result Date: 04/22/2023 CLINICAL DATA:  Dyspnea. EXAM: CHEST - 2 VIEW COMPARISON:  April 10, 2023. FINDINGS: Stable cardiomegaly. Sternotomy wires are noted. Mild bibasilar subsegmental atelectasis is noted with small pleural effusions. IMPRESSION: Mild bibasilar subsegmental atelectasis with small bilateral pleural effusions. Electronically Signed   By: Lupita Raider M.D.   On: 04/22/2023 15:41    Procedures Procedures    Medications Ordered in ED Medications  aspirin chewable tablet 324 mg (324 mg Oral Given 04/22/23 1445)  ipratropium-albuterol (DUONEB) 0.5-2.5 (3) MG/3ML nebulizer solution 3 mL (3 mLs Nebulization Given 04/22/23 1445)    ED Course/ Medical Decision Making/ A&P                                 Medical Decision Making Amount and/or Complexity of Data Reviewed External Data Reviewed: notes. Labs: ordered.    Details: Normal WBC Radiology: ordered. ECG/medicine tests: ordered and independent interpretation performed.    Details: No ischemia  Risk OTC drugs. Prescription drug management.   Patient is a poor historian.  Will give a breathing treatment to see if this helps.  Otherwise, labs and workup pending. Care transferred to Dr. Lynelle Doctor.        Final Clinical Impression(s) / ED Diagnoses Final diagnoses:  None    Rx / DC Orders ED Discharge Orders     None         Pricilla Loveless, MD 04/22/23 1550

## 2023-04-22 NOTE — ED Triage Notes (Signed)
Pt to ER via EMS from home with reports of SHOB and CP.  When fire arrived pt was ambulatory at home without O2, sats noted in high 70s, [place] 3L placed, sats recovered to high 90s.  Pt continued to c/o CP.  Pt with significant medical history.

## 2023-04-22 NOTE — ED Notes (Signed)
Blood sugar 128. Machine shut down when done. Unsure of transfer.

## 2023-04-22 NOTE — ED Notes (Signed)
ED TO INPATIENT HANDOFF REPORT  ED Nurse Name and Phone #: Richarda Osmond Name/Age/Gender [facility] 88 y.o. male Room/Bed: 029C/029C  Code Status   Code Status: Prior  Home/SNF/Other Home Patient oriented to: self, place, time, and situation Is this baseline? Yes   Triage Complete: Triage complete  Chief Complaint Acute exacerbation of congestive heart failure (HCC) [I50.9]  Triage Note Pt to ER via EMS from home with reports of SHOB and CP.  When fire arrived pt was ambulatory at home without O2, sats noted in high 70s, [place] 3L placed, sats recovered to high 90s.  Pt continued to c/o CP.  Pt with significant medical history.   Allergies No Known Allergies  Level of Care/Admitting Diagnosis ED Disposition     ED Disposition  Admit   Condition  --   Comment  Hospital Area: [facility] [[id number]]  Level of Care: Progressive [102]  Admit to Progressive based on following criteria: CARDIOVASCULAR & THORACIC of moderate stability with acute coronary syndrome symptoms/low risk myocardial infarction/hypertensive urgency/arrhythmias/heart failure potentially compromising stability and stable post cardiovascular intervention patients.  May admit patient to [facility] or [facility] if equivalent level of care is available:: No  Covid Evaluation: Confirmed COVID Negative  Diagnosis: Acute exacerbation of congestive heart failure [facility]) [[id number]]  Admitting Physician: Azucena Fallen [[id number]]  Attending Physician: Azucena Fallen [[id number]]  Certification:: I certify this patient will need inpatient services for at least 2 midnights  Expected Medical Readiness: 04/26/2023          B Medical/Surgery History Past Medical History:  Diagnosis Date   AAA (abdominal aortic aneurysm) (HCC)    Anemia    Anxiety    Arthritis    Asthma    Back pain    Cancer (HCC)    Chronic kidney disease    COPD (chronic obstructive pulmonary disease)  (HCC)    Depression    Diabetes mellitus without complication (HCC)    Dyspnea    Hypertension    Insomnia    Past Surgical History:  Procedure Laterality Date   BOWEL RESECTION     CARDIAC SURGERY     CARDIOVERSION N/A 04/15/2023   Procedure: CARDIOVERSION;  Surgeon: Jake Bathe, MD;  Location: [facility] INVASIVE CV LAB;  Service: Cardiovascular;  Laterality: N/A;   CYSTOSCOPY WITH URETHRAL DILATATION N/A 09/30/2021   Procedure: CYSTOSCOPY WITH  URETHRAL BALLOON  DILATATION;  Surgeon: Noel Christmas, MD;  Location: [facility] ORS;  Service: Urology;  Laterality: N/A;   CYSTOSCOPY WITH URETHRAL DILATATION N/A 12/09/2021   Procedure: CYSTOSCOPY WITH URETHRAL DILATATION;  Surgeon: Noel Christmas, MD;  Location: [facility] ORS;  Service: Urology;  Laterality: N/A;  1 HR   CYSTOSCOPY WITH URETHRAL DILATATION N/A 02/09/2023   Procedure: CYSTOSCOPY WITH URETHRAL DILATATION;  Surgeon: Noel Christmas, MD;  Location: [facility];  Service: Urology;  Laterality: N/A;   PROSTATE SURGERY     TRANSESOPHAGEAL ECHOCARDIOGRAM (CATH LAB) N/A 04/15/2023   Procedure: TRANSESOPHAGEAL ECHOCARDIOGRAM;  Surgeon: Jake Bathe, MD;  Location: [facility] INVAS[facility] CV [facility];  Service: Cardiovascular;  Laterality: N/A;   TRANSURETHRAL RESECTION OF BLADDER TUMOR N/A 09/30/2021   Procedure: TRANSURETHRAL RESECTION OF BLADDER TUMOR (TURBT);  Surgeon: Noel Christmas, MD;  Location: [facility];  Service: Urology;  Laterality: N/A;  90 MINS   TRANSURETHRAL RESECTION OF BLADDER TUMOR N/A 02/09/2023   Procedure: TRANSURETHRAL RESECTION OF BLADDER TUMOR (TURBT);  Surgeon: Noel Christmas, MD;  Location: [facility];  Service: Urology;  Laterality: N/A;     A IV Location/Drains/Wounds Patient Lines/Drains/Airways Status     Active Line/Drains/Airways     Name Placement date Placement time Site Days   Peripheral IV 04/22/23 Anterior;Distal;Left Forearm 04/22/23  1420  Forearm  less than 1   Colostomy LLQ 06/13/22  1026  LLQ  313             Intake/Output Last 24 hours No intake or output data in the 24 hours ending 04/22/23 1822  Labs/Imaging Results for orders placed or performed during the hospital encounter of 04/22/23 (from the past 48 hours)  Comprehensive metabolic panel     Status: Abnormal   Collection Time: 04/22/23  2:45 PM  Result Value Ref Range   Sodium 139 135 - 145 mmol/L   Potassium 3.4 (L) 3.5 - 5.1 mmol/L   Chloride 104 98 - 111 mmol/L   CO2 26 22 - 32 mmol/L   Glucose, Bld 139 (H) 70 - 99 mg/dL    Comment: Glucose reference range applies only to samples taken after fasting for at least 8 hours.   BUN 16 8 - 23 mg/dL   Creatinine, Ser 1.61 0.61 - 1.24 mg/dL   Calcium 8.6 (L) 8.9 - 10.3 mg/dL   Total Protein 6.4 (L) 6.5 - 8.1 g/dL   Albumin 3.0 (L) 3.5 - 5.0 g/dL   AST 17 15 - 41 U/L   ALT 19 0 - 44 U/L   Alkaline Phosphatase 68 38 - 126 U/L   Total Bilirubin 1.2 (H) <1.2 mg/dL   GFR, Estimated 58 (L) >60 mL/min    Comment: (NOTE) Calculated using the CKD-EPI Creatinine Equation (2021)    Anion gap 9 5 - 15    Comment: Performed at [facility], [address], [place], [place] [address]  Troponin I (High Sensitivity)     Status: Abnormal   Collection Time: 04/22/23  2:45 PM  Result Value Ref Range   Troponin I (High Sensitivity) 23 (H) <18 ng/L    Comment: (NOTE) Elevated high sensitivity troponin I (hsTnI) values and significant  changes across serial measurements may suggest ACS but many other  chronic and acute conditions are known to elevate hsTnI results.  Refer to the "Links" section for chest pain algorithms and additional  guidance. Performed at [facility], [address] [place], [place] [address]   Brain natriuretic peptide     Status: Abnormal   Collection Time: 04/22/23  2:45 PM  Result Value Ref Range   B Natriuretic Peptide 841.2 (H) 0.0 - 100.0 pg/mL    Comment: Performed at [facility], [address] [place], [place] [address]  CBC with  Differential     Status: None   Collection Time: 04/22/23  2:45 PM  Result Value Ref Range   WBC 7.7 4.0 - 10.5 K/uL   RBC 5.27 4.22 - 5.81 MIL/uL   Hemoglobin 14.7 13.0 - 17.0 g/dL   HCT 78.2 95.6 - 21.3 %   MCV 89.8 80.0 - 100.0 fL   MCH 27.9 26.0 - 34.0 pg   MCHC 31.1 30.0 - 36.0 g/dL   RDW 08.6 57.8 - 46.9 %   Platelets 197 150 - 400 K/uL   nRBC 0.0 0.0 - 0.2 %   Neutrophils Relative % 74 %   Neutro Abs 5.6 1.7 - 7.7 K/uL   Lymphocytes Relative 15 %   Lymphs Abs 1.2 0.7 - 4.0 K/uL  Monocytes Relative 7 %   Monocytes Absolute 0.6 0.1 - 1.0 K/uL   Eosinophils Relative 3 %   Eosinophils Absolute 0.2 0.0 - 0.5 K/uL   Basophils Relative 0 %   Basophils Absolute 0.0 0.0 - 0.1 K/uL   Immature Granulocytes 1 %   Abs Immature Granulocytes 0.05 0.00 - 0.07 K/uL    Comment: Performed at [facility], [address] [place], [place] [address]  Resp panel by RT-PCR (RSV, Flu A&B, Covid) Anterior Nasal Swab     Status: None   Collection Time: 04/22/23  2:45 PM   Specimen: Anterior Nasal Swab  Result Value Ref Range   SARS Coronavirus 2 by RT PCR NEGATIVE NEGATIVE   Influenza A by PCR NEGATIVE NEGATIVE   Influenza B by PCR NEGATIVE NEGATIVE    Comment: (NOTE) The Xpert Xpress SARS-CoV-2/FLU/RSV plus assay is intended as an aid in the diagnosis of influenza from Nasopharyngeal swab specimens and should not be used as a sole basis for treatment. Nasal washings and aspirates are unacceptable for Xpert Xpress SARS-CoV-2/FLU/RSV testing.  Fact Sheet for Patients: BloggerCourse.com  Fact Sheet for Healthcare Providers: SeriousBroker.it  This test is not yet approved or cleared by the [place] FDA and has been authorized for detection and/or diagnosis of SARS-CoV-2 by FDA under an Emergency Use Authorization (EUA). This EUA will remain in effect (meaning this test can be used) for the duration of the COVID-19 declaration  under Section 564(b)(1) of the Act, 21 U.S.C. section 360bbb-3(b)(1), unless the authorization is terminated or revoked.     Resp Syncytial Virus by PCR NEGATIVE NEGATIVE    Comment: (NOTE) Fact Sheet for Patients: BloggerCourse.com  Fact Sheet for Healthcare Providers: SeriousBroker.it  This test is not yet approved or cleared by the [place] FDA and has been authorized for detection and/or diagnosis of SARS-CoV-2 by FDA under an Emergency Use Authorization (EUA). This EUA will remain in effect (meaning this test can be used) for the duration of the COVID-19 declaration under Section 564(b)(1) of the Act, 21 U.S.C. section 360bbb-3(b)(1), unless the authorization is terminated or revoked.  Performed at [facility], [address], [place], [place] [address]   Troponin I (High Sensitivity)     Status: Abnormal   Collection Time: 04/22/23  4:02 PM  Result Value Ref Range   Troponin I (High Sensitivity) 19 (H) <18 ng/L    Comment: (NOTE) Elevated high sensitivity troponin I (hsTnI) values and significant  changes across serial measurements may suggest ACS but many other  chronic and acute conditions are known to elevate hsTnI results.  Refer to the "Links" section for chest pain algorithms and additional  guidance. Performed at [facility], [address], [place], [place] [address]   D-dimer, quantitative     Status: Abnormal   Collection Time: 04/22/23  4:02 PM  Result Value Ref Range   D-Dimer, Quant 0.80 (H) 0.00 - 0.50 ug/mL-FEU    Comment: (NOTE) At the manufacturer cut-off value of 0.5 g/mL FEU, this assay has a negative predictive value of 95-100%.This assay is intended for use in conjunction with a clinical pretest probability (PTP) assessment model to exclude pulmonary embolism (PE) and deep venous thrombosis (DVT) in outpatients suspected of PE or DVT. Results should be correlated with  clinical presentation. Performed at [facility], [address] [place], [place] [address]    DG Chest 2 View Result Date: 04/22/2023 CLINICAL DATA:  Dyspnea. EXAM:  CHEST - 2 VIEW COMPARISON:  April 10, 2023. FINDINGS: Stable cardiomegaly. Sternotomy wires are noted. Mild bibasilar subsegmental atelectasis is noted with small pleural effusions. IMPRESSION: Mild bibasilar subsegmental atelectasis with small bilateral pleural effusions. Electronically Signed   By: Lupita Raider M.D.   On: 04/22/2023 15:41    Pending Labs Unresulted Labs (From admission, onward)     Start     Ordered   Pending  CBC  (heparin)  Once,   R       Comments: Baseline for heparin therapy IF NOT ALREADY DRAWN.  Notify MD if PLT < 100 K.    Pending   Pending  Creatinine, serum  (heparin)  Once,   R       Comments: Baseline for heparin therapy IF NOT ALREADY DRAWN.    Pending   Pending  Basic metabolic panel  Daily,   R      Pending   Pending  CBC  Daily,   R      Pending            Vitals/Pain Today's Vitals   04/22/23 1303 04/22/23 1311 04/22/23 1615 04/22/23 1703  BP:  (!) 152/73 (!) 165/80 (!) 108/92  Pulse:  69 66 68  Resp:  18 (!) 22 16  Temp:  97.8 F (36.6 C)  (!) 97.5 F (36.4 C)  TempSrc:  Oral  Oral  SpO2:  100% 97% 98%  Weight: 95 kg     Height: 5\' 6"  (1.676 m)     PainSc: 0-No pain       Isolation Precautions No active isolations  Medications Medications  aspirin chewable tablet 324 mg (324 mg Oral Given 04/22/23 1445)  ipratropium-albuterol (DUONEB) 0.5-2.5 (3) MG/3ML nebulizer solution 3 mL (3 mLs Nebulization Given 04/22/23 1445)  furosemide (LASIX) injection 20 mg (20 mg Intravenous Given 04/22/23 1739)    Mobility walks     Focused Assessments Cardiac Assessment Handoff:    Lab Results  Component Value Date   CKTOTAL 140 04/11/2023   Lab Results  Component Value Date   DDIMER 0.80 (H) 04/22/2023   Does the Patient currently have chest pain?  No   , Pulmonary Assessment Handoff:  Lung sounds:   O2 Device: Nasal Cannula O2 Flow Rate (L/min): 3 L/min    R Recommendations: See Admitting Provider Note  Report given to:   Additional Notes:

## 2023-04-22 NOTE — H&P (Signed)
History and Physical    Eddie Graham QMV:[id number] DOB: [birth year] DOA: 04/22/2023  PCP: Andreas Blower., MD   Chief Complaint: shortness of breath/pleuritic chest pain  HPI: Eddie Graham is a 88 y.o. male who is quite well-known to our facility who has been admitted 4 times in the past 6 weeks with recurrent episodes of chest pain, respiratory distress in the setting of heart failure exacerbation.  At last visit patient's echo showed worsening ejection fraction, patient has only been discharged for 4 days before he needed to be readmitted for recurrent episode of heart failure.  Unfortunately patient is a profoundly poor historian who requires translator, he continues to speak around the translator, yelling over the translator, making communication quite difficult.  Patient has a known history of chronic heart failure with reduced ejection fraction most recently 25-30% at last hospitalization with known CAD status post CABG 2011, insulin-dependent diabetes type 2, COPD, abdominal aortic aneurysm high-grade bladder cancer status post TURP paroxysmal A-fib/flutter on Eliquis, chronic hypoxic respiratory failure on 4 L nasal cannula around-the-clock, CKD 3B, hypertension, hyperlipidemia, anemia of chronic disease.  ED Course: In the ED patient was found to be profoundly dyspneic, per EMS patient was found without home oxygen with sats in the 70s, he presents to the ED edematous, with profound orthopnea, notable pleuritic chest pain.  Patient sats on his 4 L nasal cannula baseline appears to be within normal limits, labs are generally unremarkable with minimally elevated troponin at 23 secondary to heart failure as well as markedly elevated BNP at 840 (of note his prior hospitalizations BNP was 500-600).  Given profound dyspnea, acute heart failure exacerbation, need for close monitoring, IV diuretics and further evaluation patient was admitted under hospitalist service  04/22/2023.   Review of Systems: As per HPI noted dyspnea with exertion, orthopnea, left-sided pleuritic chest pain, transient episode of dysphagia(unspecified whether this is still ongoing).   Assessment/Plan Principal Problem:   Acute exacerbation of congestive heart failure (HCC)    Goals of care  -Daughter [name] called, currently lives in [place], patient has no local family or support system other than a single friend who helps out "from time to time" -Discussed that patient would likely benefit from assisted living facility, given his recurrent heart failure exacerbation will have PT OT address safe disposition home, discharged to SNF may proved patient and family that a living facility would benefit his overall health.  We also discussed living with family or hiring help.  Patient is hyperfocused on a nursing aide which she thinks will "do everything for me" which we discussed is certainly not there a role in his health care  Acute respiratory failure without hypoxia Acute heart failure exacerbation Bilateral pleural effusions Anasarca -Recurrent episode of anasarca, pleural effusion, diffuse edema secondary to noncompliance as below -Continue aggressive diuretics, supplemental oxygen to maintain sats above 90% -BNP markedly elevated well above baseline -No indication to repeat echo given patient was just here last week with new echo showing reduced EF at 25 to 30% -D-dimer age-adjusted negative, troponin minimally elevated in the setting of heart failure exacerbation, not consistent with ACS, no indication for cardiology consult at this point -Respiratory panel obtained and negative, does not meet criteria for sepsis, no complaints of cough, no audible wheeze on exam -Holding core measures for heart failure to ensure room for diuretics in regards to renal function as well as blood pressure  Concern for noncompliance -Profoundly poor historian -unclear if there is underlying  dementia not  previously diagnosed -Multiple hospitalizations in the past 2 months, patient home only for 4 days after last discharge -patient reports " I do not use the saltshaker" but was unable to confirm that he follows a low-sodium diet, or if he truly understands what a low-sodium diet means. -While actively diuresing the patient he continues to request more water despite education on fluid restriction only reiterating his poor understanding of his disease process  COPD exacerbation unlikely -No wheeze noted on my exam or per previous documentation, DuoNeb given at intake without any improvement in symptoms  Hypertension -Holding home medications to leave room for diuretics  CKD 3B -Holding home medications to ensure diuretic tolerance  Hyperlipidemia -Holding statin currently, can resume at discharge  Anemia of chronic disease -Resume iron, this can be held until discharge if patient prefers  A-fib/flutter -Resume Eliquis, currently on low-dose 2.5 mg twice daily however his creatinine/GFR is currently at a level where 5 mg would be reasonable.  Will follow along with pharmacy given known CKD 3B and ongoing diuretics his renal function will likely be quite labile  CAD status post CABG -Holding statin, Eliquis ongoing no aspirin  DVT prophylaxis: Eliquis Code Status: Full Family Communication: Lengthy discussion with patient's daughter [name] as above  Status is: Inpatient  Dispo: The patient is from: Home              Anticipated d/c is to: SNF              Anticipated d/c date is: 72h              Patient currently not medically stable for discharge  Consultants:  None  Procedures:  None   Past Medical History:  Diagnosis Date   AAA (abdominal aortic aneurysm) (HCC)    Anemia    Anxiety    Arthritis    Asthma    Back pain    Cancer (HCC)    Chronic kidney disease    COPD (chronic obstructive pulmonary disease) (HCC)    Depression    Diabetes mellitus  without complication (HCC)    Dyspnea    Hypertension    Insomnia     Past Surgical History:  Procedure Laterality Date   BOWEL RESECTION     CARDIAC SURGERY     CARDIOVERSION N/A 04/15/2023   Procedure: CARDIOVERSION;  Surgeon: Jake Bathe, MD;  Location: [facility];  Service: Cardiovascular;  Laterality: N/A;   CYSTOSCOPY WITH URETHRAL DILATATION N/A 09/30/2021   Procedure: CYSTOSCOPY WITH  URETHRAL BALLOON  DILATATION;  Surgeon: Noel Christmas, MD;  Location: [facility];  Service: Urology;  Laterality: N/A;   CYSTOSCOPY WITH URETHRAL DILATATION N/A 12/09/2021   Procedure: CYSTOSCOPY WITH URETHRAL DILATATION;  Surgeon: Noel Christmas, MD;  Location: [facility];  Service: Urology;  Laterality: N/A;  1 HR   CYSTOSCOPY WITH URETHRAL DILATATION N/A 02/09/2023   Procedure: CYSTOSCOPY WITH URETHRAL DILATATION;  Surgeon: Noel Christmas, MD;  Location: [facility];  Service: Urology;  Laterality: N/A;   PROSTATE SURGERY     TRANSESOPHAGEAL ECHOCARDIOGRAM (CATH LAB) N/A 04/15/2023   Procedure: TRANSESOPHAGEAL ECHOCARDIOGRAM;  Surgeon: Jake Bathe, MD;  Location: [facility];  Service: Cardiovascular;  Laterality: N/A;   TRANSURETHRAL RESECTION OF BLADDER TUMOR N/A 09/30/2021   Procedure: TRANSURETHRAL RESECTION OF BLADDER TUMOR (TURBT);  Surgeon: Noel Christmas, MD;  Location: [facility];  Service: Urology;  Laterality: N/A;  90 MINS   TRANSURETHRAL RESECTION OF BLADDER  TUMOR N/A 02/09/2023   Procedure: TRANSURETHRAL RESECTION OF BLADDER TUMOR (TURBT);  Surgeon: Noel Christmas, MD;  Location: [facility];  Service: Urology;  Laterality: N/A;     reports that he has never smoked. He has never used smokeless tobacco. He reports that he does not currently use alcohol. He reports that he does not currently use drugs.  No Known Allergies  History reviewed. No pertinent family history.  Prior to Admission medications   Medication Sig Start Date End Date Taking? Authorizing Provider   acetaminophen (TYLENOL) 650 MG CR tablet Take 650 mg by mouth every 8 (eight) hours as needed for pain. 03/26/22   [provider]  albuterol (VENTOLIN HFA) 108 (90 Base) MCG/ACT inhaler Inhale 2 puffs into the lungs every 4 (four) hours as needed for wheezing. 11/11/20   [provider]  Ernestina Patches 62.5-25 MCG/ACT AEPB Inhale 1 puff into the lungs daily.    [provider]  apixaban (ELIQUIS) 2.5 MG TABS tablet Take 1 tablet (2.5 mg total) by mouth 2 (two) times daily. 01/29/23   Marguerita Merles Latif, DO  Carboxymethylcellulose Sodium (EYE DROPS OP) Place 2 drops into both eyes as needed (dryness/itching).    [provider]  DULoxetine (CYMBALTA) 60 MG capsule Take 60 mg by mouth daily. 02/07/21   [provider]  empagliflozin (JARDIANCE) 10 MG TABS tablet Take 1 tablet (10 mg total) by mouth daily before breakfast. 03/01/23   Almon Hercules, MD  ferrous sulfate 325 (65 FE) MG tablet Take 325 mg by mouth daily with breakfast. 08/21/22   [provider]  furosemide (LASIX) 20 MG tablet Take 1 tablet (20 mg total) by mouth daily. 03/08/23   Almon Hercules, MD  gabapentin (NEURONTIN) 100 MG capsule Take 100-300 mg by mouth at bedtime as needed (leg pain). 02/25/21   [provider]  Insulin Pen Needle (TECHLITE PEN NEEDLES) 32G X 4 MM MISC Use to inject Lantus at bedtime as directed 06/15/22   Rhetta Mura, MD  LANTUS SOLOSTAR 100 UNIT/ML Solostar Pen Inject 15 Units into the skin at bedtime. Patient taking differently: Inject 24 Units into the skin at bedtime. 06/15/22   Rhetta Mura, MD  Melatonin 5 MG CAPS Take 5 mg by mouth at bedtime as needed (sleep). 08/16/19   [provider]  metoprolol succinate (TOPROL-XL) 100 MG 24 hr tablet Take 1 tablet (100 mg total) by mouth daily. Take with or immediately following a meal. 01/30/23   Sheikh, Omair Latif, DO  montelukast (SINGULAIR) 10 MG tablet Take 10 mg by mouth at  bedtime. 03/19/21   [provider]  pantoprazole (PROTONIX) 40 MG tablet Take 1 tablet by mouth 2 times daily. 06/15/22   Rhetta Mura, MD  polyethylene glycol (MIRALAX / GLYCOLAX) 17 g packet Take 17 g by mouth daily as needed for mild constipation. 03/24/22   [provider]  rosuvastatin (CRESTOR) 40 MG tablet Take 40 mg by mouth at bedtime. 04/07/21   [provider]  Semaglutide 14 MG TABS Take 14 mg by mouth daily. 03/14/21   [provider]  traMADol (ULTRAM) 50 MG tablet Take 1 tablet (50 mg total) by mouth every 6 (six) hours as needed. 03/01/23 02/29/24  Almon Hercules, MD  traZODone (DESYREL) 50 MG tablet Take 50 mg by mouth at bedtime. 03/14/21   [provider]    Physical Exam: Vitals:   04/22/23 1303 04/22/23 1311 04/22/23 1615 04/22/23 1703  BP:  Marland Kitchen)  152/73 (!) 165/80 (!) 108/92  Pulse:  69 66 68  Resp:  18 (!) 22 16  Temp:  97.8 F (36.6 C)  (!) 97.5 F (36.4 C)  TempSrc:  Oral  Oral  SpO2:  100% 97% 98%  Weight: 95 kg     Height: 5\' 6"  (1.676 m)       Constitutional: NAD, calm, comfortable Vitals:   04/22/23 1303 04/22/23 1311 04/22/23 1615 04/22/23 1703  BP:  (!) 152/73 (!) 165/80 (!) 108/92  Pulse:  69 66 68  Resp:  18 (!) 22 16  Temp:  97.8 F (36.6 C)  (!) 97.5 F (36.4 C)  TempSrc:  Oral  Oral  SpO2:  100% 97% 98%  Weight: 95 kg     Height: 5\' 6"  (1.676 m)      General: Patient dyspneic even at rest, 3-4 word sentences Neck:  Without mass or deformity.  Trachea is midline.  Nontender to palpation Lungs: Bilateral rales with profoundly diminished respiratory excursion, no overt wheeze. Heart: Regular but tachycardic, distant. Abdomen:  Soft, nontender, edematous, noted ascites. Extremities: 3+ pitting edema bilateral lower extremity to the mid shin  Labs on Admission: I have personally reviewed following labs and imaging studies  CBC: Recent Labs  Lab 04/16/23 0457 04/22/23 1445  WBC 6.6 7.7   NEUTROABS  --  5.6  HGB 13.3 14.7  HCT 41.9 47.3  MCV 89.7 89.8  PLT 187 197   Basic Metabolic Panel: Recent Labs  Lab 04/16/23 0457 04/17/23 0330 04/22/23 1445  NA 134* 135 139  K 4.2 4.1 3.4*  CL 100 103 104  CO2 26 21* 26  GLUCOSE 197* 159* 139*  BUN 31* 25* 16  CREATININE 1.78* 1.55* 1.21  CALCIUM 8.4* 8.4* 8.6*  MG 2.4  --   --    GFR: Estimated Creatinine Clearance: 47.3 mL/min (by C-G formula based on SCr of 1.21 mg/dL). Liver Function Tests: Recent Labs  Lab 04/16/23 0457 04/22/23 1445  AST 15 17  ALT 18 19  ALKPHOS 62 68  BILITOT 1.0 1.2*  PROT 5.7* 6.4*  ALBUMIN 3.0* 3.0*   CBG: Recent Labs  Lab 04/16/23 1244 04/16/23 1655 04/16/23 2137 04/17/23 0751 04/17/23 1213  GLUCAP 213* 111* 240* 146* 253*   Urine analysis:    Component Value Date/Time   COLORURINE YELLOW 04/12/2023 2046   APPEARANCEUR HAZY (A) 04/12/2023 2046   LABSPEC 1.010 04/12/2023 2046   PHURINE 6.0 04/12/2023 2046   GLUCOSEU 50 (A) 04/12/2023 2046   HGBUR LARGE (A) 04/12/2023 2046   BILIRUBINUR NEGATIVE 04/12/2023 2046   KETONESUR NEGATIVE 04/12/2023 2046   PROTEINUR 30 (A) 04/12/2023 2046   NITRITE NEGATIVE 04/12/2023 2046   LEUKOCYTESUR MODERATE (A) 04/12/2023 2046    Radiological Exams on Admission: DG Chest 2 View Result Date: 04/22/2023 CLINICAL DATA:  Dyspnea. EXAM: CHEST - 2 VIEW COMPARISON:  April 10, 2023. FINDINGS: Stable cardiomegaly. Sternotomy wires are noted. Mild bibasilar subsegmental atelectasis is noted with small pleural effusions. IMPRESSION: Mild bibasilar subsegmental atelectasis with small bilateral pleural effusions. Electronically Signed   By: Lupita Raider M.D.   On: 04/22/2023 15:41   EKG: Independently reviewed.  Low voltage without overt ST changes   Azucena Fallen DO [facility] For contact please use secure messenger on Epic  If 7PM-7AM, please contact night-coverage located on [email or web address]   04/22/2023, 6:16 PM

## 2023-04-23 DIAGNOSIS — I5023 Acute on chronic systolic (congestive) heart failure: Secondary | ICD-10-CM | POA: Diagnosis not present

## 2023-04-23 LAB — BASIC METABOLIC PANEL
Anion gap: 10 (ref 5–15)
BUN: 24 mg/dL — ABNORMAL HIGH (ref 8–23)
CO2: 29 mmol/L (ref 22–32)
Calcium: 8.5 mg/dL — ABNORMAL LOW (ref 8.9–10.3)
Chloride: 101 mmol/L (ref 98–111)
Creatinine, Ser: 1.84 mg/dL — ABNORMAL HIGH (ref 0.61–1.24)
GFR, Estimated: 35 mL/min — ABNORMAL LOW (ref >60)
Glucose, Bld: 144 mg/dL — ABNORMAL HIGH (ref 70–99)
Potassium: 3.5 mmol/L (ref 3.5–5.1)
Sodium: 140 mmol/L (ref 135–145)

## 2023-04-23 LAB — CBC
HCT: 47.6 % (ref 39.0–52.0)
Hemoglobin: 14.8 g/dL (ref 13.0–17.0)
MCH: 27.6 pg (ref 26.0–34.0)
MCHC: 31.1 g/dL (ref 30.0–36.0)
MCV: 88.8 fL (ref 80.0–100.0)
Platelets: 196 10*3/uL (ref 150–400)
RBC: 5.36 MIL/uL (ref 4.22–5.81)
RDW: 15.1 % (ref 11.5–15.5)
WBC: 8.7 10*3/uL (ref 4.0–10.5)
nRBC: 0 % (ref 0.0–0.2)

## 2023-04-23 LAB — GLUCOSE, CAPILLARY
Glucose-Capillary: 148 mg/dL — ABNORMAL HIGH (ref 70–99)
Glucose-Capillary: 247 mg/dL — ABNORMAL HIGH (ref 70–99)
Glucose-Capillary: 264 mg/dL — ABNORMAL HIGH (ref 70–99)
Glucose-Capillary: 227 mg/dL — ABNORMAL HIGH (ref 70–99)

## 2023-04-23 MED ORDER — BENZOCAINE 20 % MT AERO
INHALATION_SPRAY | Freq: Four times a day (QID) | OROMUCOSAL | Status: DC | PRN
  Administered 2023-04-23 – 2023-04-29 (×4): 1 via OROMUCOSAL
  Filled 2023-04-23: qty 57

## 2023-04-23 MED ORDER — SODIUM CHLORIDE 0.9% FLUSH: 3.0000 mL | INTRAVENOUS | Status: DC | PRN

## 2023-04-23 MED ORDER — APIXABAN 5 MG PO TABS
5.0000 mg | ORAL_TABLET | Freq: Two times a day (BID) | ORAL | Status: DC
  Administered 2023-04-23 – 2023-04-25 (×4): 5 mg via ORAL
  Filled 2023-04-23 (×4): qty 1

## 2023-04-23 MED ORDER — PHENAZOPYRIDINE HCL 100 MG PO TABS
100.0000 mg | ORAL_TABLET | Freq: Three times a day (TID) | ORAL | Status: DC
  Administered 2023-04-23 (×3): 100 mg via ORAL
  Filled 2023-04-23 (×4): qty 1

## 2023-04-23 MED ORDER — SODIUM CHLORIDE 0.9% FLUSH
3.0000 mL | Freq: Two times a day (BID) | INTRAVENOUS | Status: DC
Start: 2023-04-23 — End: 2023-05-03
  Administered 2023-04-23 – 2023-04-26 (×7): 10 mL via INTRAVENOUS
  Administered 2023-04-26: 3 mL via INTRAVENOUS
  Administered 2023-04-27 – 2023-04-28 (×3): 10 mL via INTRAVENOUS
  Administered 2023-04-28: 3 mL via INTRAVENOUS
  Administered 2023-04-29 – 2023-04-30 (×3): 10 mL via INTRAVENOUS
  Administered 2023-04-30: 5 mL via INTRAVENOUS
  Administered 2023-05-01 (×2): 3 mL via INTRAVENOUS
  Administered 2023-05-02 – 2023-05-03 (×3): 10 mL via INTRAVENOUS

## 2023-04-23 NOTE — Evaluation (Signed)
Physical Therapy Evaluation Patient Details Name: Eddie Graham MRN: [id number] DOB: 07-07-1934 Today's Date: 04/23/2023  History of Present Illness  Pt is an 88 y.o. male admitted 12/26 with acute exacerbation of CHF. He presented to the ED via EMS with c/o SOB and CP. Multiple recent hospitalizations with similar presentation. PMH: CAD, CHF, COPD, HTN, AAA, CKD, DM, neuropathy, a fib/flutter, colostomy, depression, back pain   Clinical Impression  Pt admitted with above diagnosis. PTA pt lived at home alone, mod I mobility using SPC vs no AD. Pt currently with functional limitations due to the deficits listed below (see PT Problem List). On eval, pt required supervision bed mobility, CGA transfers, and CGA amb 25' without AD. Pt on 3.5L continuous O2 with SpO2 94-97%. No SOB/DOE noted. Pt will benefit from acute skilled PT to increase their independence and safety with mobility to allow discharge. Due to multiple recent hospitalizations, questionable compliance with home O2, and limited home assist, pt would benefit from further therapy in inpatient setting < 3 hours/day.           If plan is discharge home, recommend the following: A little help with walking and/or transfers;A little help with bathing/dressing/bathroom;Assist for transportation;Assistance with cooking/housework;Direct supervision/assist for medications management   Can travel by private vehicle   Yes    Equipment Recommendations None recommended by PT  Recommendations for Other Services       Functional Status Assessment Patient has had a recent decline in their functional status and demonstrates the ability to make significant improvements in function in a reasonable and predictable amount of time.     Precautions / Restrictions Precautions Precautions: Fall;Other (comment) Precaution Comments: colostomy; watch SpO2 (3-4L at baseline)      Mobility  Bed Mobility Overal bed mobility: Needs  Assistance Bed Mobility: Supine to Sit     Supine to sit: HOB elevated, Used rails, Supervision          Transfers Overall transfer level: Needs assistance Equipment used: None Transfers: Sit to/from Stand Sit to Stand: Contact guard assist                Ambulation/Gait Ambulation/Gait assistance: Contact guard assist Gait Distance (Feet): 25 Feet Assistive device: None Gait Pattern/deviations: Step-through pattern, Decreased stride length Gait velocity: decreased     General Gait Details: furniture walking in room. SpO2 in 90s on 3.5L. No SOB/DOE noted.  Stairs            Wheelchair Mobility     Tilt Bed    Modified Rankin (Stroke Patients Only)       Balance Overall balance assessment: Needs assistance Sitting-balance support: No upper extremity supported, Feet supported Sitting balance-Leahy Scale: Good     Standing balance support: No upper extremity supported, During functional activity, Single extremity supported Standing balance-Leahy Scale: Fair                               Pertinent Vitals/Pain Pain Assessment Pain Assessment: Faces Faces Pain Scale: Hurts even more Pain Location: throat Pain Descriptors / Indicators: Discomfort, Grimacing, Sore Pain Intervention(s): Monitored during session, Repositioned, RN gave pain meds during session    Home Living Family/patient expects to be discharged to:: Private residence Living Arrangements: Alone Available Help at Discharge: Friend(s);Available PRN/intermittently Type of Home: Apartment Home Access: Level entry       Home Layout: One level Home Equipment: Agricultural consultant (2 wheels);Cane - single point;Electric scooter;BSC/3in1  Additional Comments: Daughter lives in [place].    Prior Function Prior Level of Function : Independent/Modified Independent             Mobility Comments: amb with SPC vs no AD ADLs Comments: mod I ADLs, including ostomy management      Extremity/Trunk Assessment   Upper Extremity Assessment Upper Extremity Assessment: Overall WFL for tasks assessed;Right hand dominant    Lower Extremity Assessment Lower Extremity Assessment: Overall WFL for tasks assessed    Cervical / Trunk Assessment Cervical / Trunk Assessment: Normal  Communication   Communication Communication: Difficulty following commands/understanding Following commands: Follows one step commands consistently  Cognition Arousal: Alert Behavior During Therapy: WFL for tasks assessed/performed Overall Cognitive Status: No family/caregiver present to determine baseline cognitive functioning                                 General Comments: Following one step commands consistently. Mildly impulsive. Jumbled speech, switching from Spanish to [place]        General Comments General comments (skin integrity, edema, etc.): Pt on 3.5L continuous O2. SpO2 ranging 94-97%    Exercises     Assessment/Plan    PT Assessment Patient needs continued PT services  PT Problem List Decreased activity tolerance;Decreased balance;Decreased mobility;Decreased cognition;Decreased safety awareness;Cardiopulmonary status limiting activity       PT Treatment Interventions DME instruction;Gait training;Stair training;Functional mobility training;Therapeutic activities;Therapeutic exercise;Balance training;Patient/family education    PT Goals (Current goals can be found in the Care Plan section)  Acute Rehab PT Goals Patient Stated Goal: home PT Goal Formulation: With patient Time For Goal Achievement: 05/07/23 Potential to Achieve Goals: Good    Frequency Min 1X/week     Co-evaluation               AM-PAC PT "6 Clicks" Mobility  Outcome Measure Help needed turning from your back to your side while in a flat bed without using bedrails?: None Help needed moving from lying on your back to sitting on the side of a flat bed without using  bedrails?: A Little Help needed moving to and from a bed to a chair (including a wheelchair)?: A Little Help needed standing up from a chair using your arms (e.g., wheelchair or bedside chair)?: A Little Help needed to walk in hospital room?: A Little Help needed climbing 3-5 steps with a railing? : A Little 6 Click Score: 19    End of Session Equipment Utilized During Treatment: Oxygen Activity Tolerance: Patient tolerated treatment well Patient left: in chair;with call bell/phone within reach;with chair alarm set Nurse Communication: Mobility status PT Visit Diagnosis: Other abnormalities of gait and mobility (R26.89)    Time: 9528-4132 PT Time Calculation (min) (ACUTE ONLY): 20 min   Charges:   PT Evaluation $PT Eval Low Complexity: 1 Low   PT General Charges $$ ACUTE PT VISIT: 1 Visit         Ferd Glassing., PT  Office # [phone]   Ilda Foil 04/23/2023, 8:53 AM

## 2023-04-23 NOTE — Progress Notes (Signed)
PHARMACY - ANTICOAGULATION CONSULT NOTE  Pharmacy Consult for Eliquis Indication: atrial fibrillation  No Known Allergies  Patient Measurements: Height: 5\' 6"  (167.6 cm) Weight: 94.6 kg (208 lb 8.9 oz) IBW/kg (Calculated) : 63.8  Vital Signs: Temp: 97.8 F (36.6 C) (12/27 0759) Temp Source: Oral (12/27 0759) BP: 123/67 (12/27 0759) Pulse Rate: 76 (12/27 0759)  Labs: Recent Labs    04/22/23 1445 04/22/23 1602 04/23/23 0349  HGB 14.7  --  14.8  HCT 47.3  --  47.6  PLT 197  --  196  CREATININE 1.21  --  1.84*  TROPONINIHS 23* 19*  --     Estimated Creatinine Clearance: 31 mL/min (A) (by C-G formula based on SCr of 1.84 mg/dL (H)).   Medical History: Past Medical History:  Diagnosis Date   AAA (abdominal aortic aneurysm) (HCC)    Anemia    Anxiety    Arthritis    Asthma    Back pain    Cancer (HCC)    Chronic kidney disease    COPD (chronic obstructive pulmonary disease) (HCC)    Depression    Diabetes mellitus without complication (HCC)    Dyspnea    Hypertension    Insomnia     Medications:  Scheduled:   apixaban  2.5 mg Oral BID   DULoxetine  60 mg Oral Daily   empagliflozin  10 mg Oral QAC breakfast   ferrous sulfate  325 mg Oral Q breakfast   furosemide  80 mg Intravenous Q12H   insulin aspart  0-5 Units Subcutaneous QHS   insulin aspart  0-9 Units Subcutaneous TID WC   pantoprazole  40 mg Oral BID   phenazopyridine  100 mg Oral TID WC   Semaglutide  14 mg Oral Daily   sodium chloride flush  3 mL Intravenous Q12H   traZODone  50 mg Oral QHS   umeclidinium-vilanterol  1 puff Inhalation Daily    Assessment: 3 YOM who presented for increased shortness of breath and pleuritic chest pain in the setting of an acute heart failure exacerbation. Of note, he has been admitted 4 times within the past 6 weeks. He does have a history of atrial fibrillation and pharmacy has been consulted to dose Eliquis.   Patient was on Eliquis 2.5 mg PO BID prior to  admission, however unclear when last dose was taken. Upon reviewing prior admissions, his Scr tends to sit anywhere from 1.60-1.90. Given his labile renal function, current Scr of 1.84 and his age (>72 YO), would recommend continuing low dose Eliquis. Hgb stable at 14.8, PLT count stable at 196.   Goal of Therapy:  Monitor platelets by anticoagulation protocol: Yes   Plan:  Continue Eliquis 2.5 mg PO daily Monitor CBC as indicated Monitor for s/sx of bleeding daily  Pharmacy will sign-off at this time but will continue to monitor peripherally. Thank you for allowing pharmacy to be a part of this patient's care.  Lennie Muckle, PharmD PGY1 Pharmacy Resident 04/23/2023 9:02 AM

## 2023-04-23 NOTE — Plan of Care (Signed)
  Problem: Education: Goal: Ability to describe self-care measures that may prevent or decrease complications (Diabetes Survival Skills Education) will improve Outcome: Progressing   Problem: Coping: Goal: Ability to adjust to condition or change in health will improve Outcome: Progressing   Problem: Fluid Volume: Goal: Ability to maintain a balanced intake and output will improve Outcome: Progressing   Problem: Nutritional: Goal: Maintenance of adequate nutrition will improve Outcome: Progressing   Problem: Skin Integrity: Goal: Risk for impaired skin integrity will decrease Outcome: Progressing   Problem: Education: Goal: Knowledge of General Education information will improve Description: Including pain rating scale, medication(s)/side effects and non-pharmacologic comfort measures Outcome: Progressing   Problem: Safety: Goal: Ability to remain free from injury will improve Outcome: Progressing   Problem: Metabolic: Goal: Ability to maintain appropriate glucose levels will improve Outcome: Not Progressing   Problem: Nutritional: Goal: Progress toward achieving an optimal weight will improve Outcome: Not Progressing   Problem: Clinical Measurements: Goal: Respiratory complications will improve Outcome: Not Progressing Goal: Cardiovascular complication will be avoided Outcome: Not Progressing   Problem: Activity: Goal: Risk for activity intolerance will decrease Outcome: Not Progressing   Problem: Pain Management: Goal: General experience of comfort will improve Outcome: Not Progressing

## 2023-04-23 NOTE — Progress Notes (Addendum)
PROGRESS NOTE    Eddie Graham  FAO:[id number] DOB: 1934-12-03 DOA: 04/22/2023 PCP: Andreas Blower., MD   Brief Narrative:  Eddie Graham is a 88 y.o. male who is quite well-known to our facility who has been admitted 4 times in the past 6 weeks with recurrent episodes of chest pain, respiratory distress in the setting of heart failure exacerbation.  He presents yet again with volume overload heart failure exacerbation with associated dyspnea, orthopnea, pleuritic chest pain.   Patient has a known history of chronic heart failure with reduced ejection fraction most recently 25-30% at last hospitalization with known CAD status post CABG 2011, insulin-dependent diabetes type 2, COPD, abdominal aortic aneurysm high-grade bladder cancer status post TURP paroxysmal A-fib/flutter on Eliquis, chronic hypoxic respiratory failure on 4 L nasal cannula around-the-clock, CKD 3B, hypertension, hyperlipidemia, anemia of chronic disease.   Assessment & Plan:   Principal Problem:   Acute exacerbation of congestive heart failure (HCC)  Goals of care  -Daughter [name] notified of admission, currently lives in [place], patient has no local family or support system other than a single friend who helps out "from time to time" -Discussed that patient would likely benefit from assisted living facility, given his recurrent heart failure exacerbation will have PT OT address safe disposition home, discharged to SNF may proved patient and family that a living facility would benefit his overall health.  -We also discussed that should he continue to decline we may need to involve palliative care and hospice team in the near future   Acute respiratory failure without hypoxia Acute heart failure exacerbation Bilateral pleural effusions Anasarca -Continue aggressive diuretics, supplemental oxygen to maintain sats above 90% -No indication to repeat echo given patient was just here last week with  new echo showing reduced EF at 25 to 30% -Holding core measures for heart failure to ensure room for diuretics in regards to renal function as well as blood pressure -I/O net negative; creatinine mildly elevated   Concern for noncompliance -Profoundly poor historian -unclear if there is underlying dementia not previously diagnosed -Multiple hospitalizations in the past 2 months, patient home only for 4 days after last discharge   COPD exacerbation ruled out -No wheeze noted on my exam or per previous documentation, DuoNeb given at intake without any improvement in symptoms   Hypertension -Holding home medications to leave room for diuretics   CKD 3B -Holding home medications to ensure diuretic tolerance   Hyperlipidemia -Holding statin currently, can resume at discharge   Anemia of chronic disease -Continue iron, this can be held until discharge if patient prefers   A-fib/flutter -Continue Eliquis, currently on low-dose 2.5 mg twice daily however given patient only meets 1 criteria for low-dose with age will transition to 5 mg dosing, follow creatinine and GFR closely given above   CAD status post CABG -Holding statin, Eliquis ongoing no aspirin  DVT prophylaxis: apixaban (ELIQUIS) tablet 2.5 mg Start: 04/22/23 2200 apixaban (ELIQUIS) tablet 2.5 mg   Code Status:   Code Status: Full Code  Family Communication: [name](Daughter)  Status is: Inpt  Dispo: The patient is from: Home              Anticipated d/c is to: TBD (SNF pending evaluation)              Anticipated d/c date is: 48-72h              Patient currently not medically stable for discharge  Consultants:  None  Procedures:  None  Antimicrobials:  None   Subjective: No acute issues/events overnight - throat pain improving ("about the same as last week") and does not interfere with PO intake. Pleuritic chest pain resolved.  Objective: Vitals:   04/22/23 1703 04/22/23 2120 04/22/23 2340 04/23/23 0416   BP: (!) 108/92 132/85 105/74 135/68  Pulse: 68 73 73 64  Resp: 16 18 20 14   Temp: (!) 97.5 F (36.4 C) (!) 97.5 F (36.4 C) (!) 97.5 F (36.4 C) (!) 97.5 F (36.4 C)  TempSrc: Oral Axillary Oral Oral  SpO2: 98% 96% 95% 96%  Weight:  94.6 kg    Height:        Intake/Output Summary (Last 24 hours) at 04/23/2023 0728 Last data filed at 04/23/2023 0300 Gross per 24 hour  Intake 240 ml  Output 2025 ml  Net -1785 ml   Filed Weights   04/22/23 1303 04/22/23 2120  Weight: 95 kg 94.6 kg    Examination:  General: Patient dyspneic even at rest, 3-4 word sentences Neck:  Without mass or deformity.  Trachea is midline.  Nontender to palpation Lungs: Bilateral rales with profoundly diminished respiratory excursion, no overt wheeze. Heart: Regular but tachycardic, distant Abdomen:  Soft, nontender, edematous, noted ascites. Extremities: 2+ pitting edema bilateral lower extremity to the mid shin  Data Reviewed: I have personally reviewed following labs and imaging studies  CBC: Recent Labs  Lab 04/22/23 1445 04/23/23 0349  WBC 7.7 8.7  NEUTROABS 5.6  --   HGB 14.7 14.8  HCT 47.3 47.6  MCV 89.8 88.8  PLT 197 196   Basic Metabolic Panel: Recent Labs  Lab 04/17/23 0330 04/22/23 1445 04/23/23 0349  NA 135 139 140  K 4.1 3.4* 3.5  CL 103 104 101  CO2 21* 26 29  GLUCOSE 159* 139* 144*  BUN 25* 16 24*  CREATININE 1.55* 1.21 1.84*  CALCIUM 8.4* 8.6* 8.5*   GFR: Estimated Creatinine Clearance: 31 mL/min (A) (by C-G formula based on SCr of 1.84 mg/dL (H)). Liver Function Tests: Recent Labs  Lab 04/22/23 1445  AST 17  ALT 19  ALKPHOS 68  BILITOT 1.2*  PROT 6.4*  ALBUMIN 3.0*   CBG: Recent Labs  Lab 04/17/23 0751 04/17/23 1213 04/22/23 1946 04/22/23 2154 04/23/23 0556  GLUCAP 146* 253* 138* 242* 148*   Radiology Studies: DG Chest 2 View Result Date: 04/22/2023 CLINICAL DATA:  Dyspnea. EXAM: CHEST - 2 VIEW COMPARISON:  April 10, 2023. FINDINGS: Stable  cardiomegaly. Sternotomy wires are noted. Mild bibasilar subsegmental atelectasis is noted with small pleural effusions. IMPRESSION: Mild bibasilar subsegmental atelectasis with small bilateral pleural effusions. Electronically Signed   By: Lupita Raider M.D.   On: 04/22/2023 15:41   Scheduled Meds:  apixaban  2.5 mg Oral BID   DULoxetine  60 mg Oral Daily   empagliflozin  10 mg Oral QAC breakfast   ferrous sulfate  325 mg Oral Q breakfast   furosemide  80 mg Intravenous Q12H   insulin aspart  0-5 Units Subcutaneous QHS   insulin aspart  0-9 Units Subcutaneous TID WC   pantoprazole  40 mg Oral BID   phenazopyridine  100 mg Oral TID WC   Semaglutide  14 mg Oral Daily   sodium chloride flush  3 mL Intravenous Q12H   traZODone  50 mg Oral QHS   umeclidinium-vilanterol  1 puff Inhalation Daily   Continuous Infusions:  sodium chloride       LOS: 1 day   Time  spent:  Azucena Fallen, DO [facility]  If 7PM-7AM, please contact night-coverage [email or web address]  04/23/2023, 7:28 AM

## 2023-04-23 NOTE — Evaluation (Signed)
Occupational Therapy Evaluation Patient Details Name: Eddie Graham MRN: [id number] DOB: 1934/07/03 Today's Date: 04/23/2023   History of Present Illness Pt is an 88 y.o. male admitted 12/26 with acute exacerbation of CHF. He presented to the ED via EMS with c/o SOB and CP. Multiple recent hospitalizations with similar presentation. PMH: CAD, CHF, COPD, HTN, AAA, CKD, DM, neuropathy, a fib/flutter, colostomy, depression, back pain   Clinical Impression   Pt in good spirits, sitting EOB eating upon entry, on room air at 89% O2, recovered to 93 with 3.5L O2 [place]. Pt lives alone, has had multiple recent hospitalizations in the last 6 weeks, PLOF mod I at home alone. Pt currently close to [place]. Pt currently close to baseline with ADLs/mobility, but is limited due to SOB, decreased safety awareness, limiting activity tolerance and safety in home setting. Pt would benefit from postacute rehab <3hrs/day to maximize and improve safety/independence. Will continue to see acutely to progress as able.       If plan is discharge home, recommend the following: A little help with walking and/or transfers;A little help with bathing/dressing/bathroom;Assist for transportation    Functional Status Assessment  Patient has had a recent decline in their functional status and demonstrates the ability to make significant improvements in function in a reasonable and predictable amount of time.  Equipment Recommendations  Other (comment) (defer)    Recommendations for Other Services       Precautions / Restrictions Precautions Precautions: Fall;Other (comment) Precaution Comments: colostomy; watch SpO2 (3-4L at baseline) Restrictions Weight Bearing Restrictions Per Provider Order: No      Mobility Bed Mobility Overal bed mobility: Modified Independent                  Transfers Overall transfer level: Needs assistance   Transfers: Sit to/from Stand, Bed to  chair/wheelchair/BSC Sit to Stand: Supervision     Step pivot transfers: Supervision     General transfer comment: supervision for safety, able to ambulate around room without AD      Balance Overall balance assessment: Needs assistance Sitting-balance support: No upper extremity supported, Feet supported Sitting balance-Leahy Scale: Good     Standing balance support: No upper extremity supported Standing balance-Leahy Scale: Good Standing balance comment: stand without support as needed                           ADL either performed or assessed with clinical judgement   ADL Overall ADL's : Needs assistance/impaired Eating/Feeding: Independent   Grooming: Supervision/safety;Standing;Wash/dry face;Wash/dry hands   Upper Body Bathing: Modified independent   Lower Body Bathing: Supervison/ safety;Sitting/lateral leans;Sit to/from stand   Upper Body Dressing : Set up;Sitting   Lower Body Dressing: Minimal assistance;Sit to/from stand   Toilet Transfer: Supervision/safety   Toileting- Architect and Hygiene: Modified independent       Functional mobility during ADLs: Supervision/safety General ADL Comments: min A for LB ADLs, supervision for safety for mobility.     Vision Baseline Vision/History: 1 Wears glasses Ability to See in Adequate Light: 0 Adequate Patient Visual Report: No change from baseline       Perception         Praxis         Pertinent Vitals/Pain Pain Assessment Pain Assessment: 0-10 Pain Score: 3  Pain Location: throat Pain Descriptors / Indicators: Discomfort, Grimacing, Sore Pain Intervention(s): Monitored during session     Extremity/Trunk Assessment Upper Extremity Assessment Upper Extremity Assessment: RUE  deficits/detail;LUE deficits/detail RUE Deficits / Details: Pt reports numbness to both hands occasionally RUE Coordination: WNL LUE Deficits / Details: Pt reports numbness to both hands  occasionally LUE Coordination: WNL           Communication Communication Communication: Difficulty communicating thoughts/reduced clarity of speech   Cognition Arousal: Alert Behavior During Therapy: WFL for tasks assessed/performed Overall Cognitive Status: Within Functional Limits for tasks assessed                                 General Comments: A/O x4, able to follow commands, able to participate and reply as needed throughout session     General Comments  3.5L O2 [place] continuous, above 93 during session, while sitting EOB on room air at 89%    Exercises     Shoulder Instructions      Home Living Family/patient expects to be discharged to:: Private residence Living Arrangements: Alone Available Help at Discharge: Friend(s);Available PRN/intermittently Type of Home: Apartment Home Access: Level entry     Home Layout: One level     Bathroom Shower/Tub: Chief Strategy Officer: Standard Bathroom Accessibility: Yes   Home Equipment: Agricultural consultant (2 wheels);Cane - single point;Electric scooter;BSC/3in1   Additional Comments: Daughter lives in [place].      Prior Functioning/Environment Prior Level of Function : Independent/Modified Independent             Mobility Comments: amb with SPC vs no AD ADLs Comments: mod I ADLs, including ostomy management        OT Problem List: Decreased activity tolerance;Pain;Decreased safety awareness      OT Treatment/Interventions: Self-care/ADL training;Therapeutic exercise;Energy conservation;DME and/or AE instruction;Therapeutic activities;Patient/family education    OT Goals(Current goals can be found in the care plan section) Acute Rehab OT Goals Patient Stated Goal: to improve strength, manage pain OT Goal Formulation: With patient Time For Goal Achievement: 05/07/23 Potential to Achieve Goals: Good  OT Frequency: Min 1X/week    Co-evaluation              AM-PAC OT "6 Clicks"  Daily Activity     Outcome Measure Help from another person eating meals?: None Help from another person taking care of personal grooming?: A Little Help from another person toileting, which includes using toliet, bedpan, or urinal?: A Little Help from another person bathing (including washing, rinsing, drying)?: A Little Help from another person to put on and taking off regular upper body clothing?: None Help from another person to put on and taking off regular lower body clothing?: A Little 6 Click Score: 20   End of Session Equipment Utilized During Treatment: Oxygen Nurse Communication: Mobility status  Activity Tolerance: Patient tolerated treatment well Patient left: in bed;with call bell/phone within reach  OT Visit Diagnosis: Muscle weakness (generalized) (M62.81);Pain Pain - part of body:  (throat)                Time: 2952-8413 OT Time Calculation (min): 30 min Charges:  OT General Charges $OT Visit: 1 Visit OT Evaluation $OT Eval Low Complexity: 1 Low OT Treatments $Self Care/Home Management : 8-22 mins  [address], OTR/L   Alexis Goodell 04/23/2023, 3:03 PM

## 2023-04-23 NOTE — Plan of Care (Signed)
  Problem: Education: Goal: Ability to describe self-care measures that may prevent or decrease complications (Diabetes Survival Skills Education) will improve Outcome: Not Progressing   Problem: Coping: Goal: Ability to adjust to condition or change in health will improve Outcome: Not Progressing   Problem: Fluid Volume: Goal: Ability to maintain a balanced intake and output will improve Outcome: Not Progressing   Problem: Health Behavior/Discharge Planning: Goal: Ability to manage health-related needs will improve Outcome: Not Progressing   Problem: Metabolic: Goal: Ability to maintain appropriate glucose levels will improve Outcome: Not Progressing   Problem: Nutritional: Goal: Progress toward achieving an optimal weight will improve Outcome: Not Progressing   Problem: Skin Integrity: Goal: Risk for impaired skin integrity will decrease Outcome: Not Progressing   Problem: Education: Goal: Knowledge of General Education information will improve Description: Including pain rating scale, medication(s)/side effects and non-pharmacologic comfort measures Outcome: Not Progressing   Problem: Clinical Measurements: Goal: Respiratory complications will improve Outcome: Not Progressing   Problem: Activity: Goal: Risk for activity intolerance will decrease Outcome: Not Progressing   Problem: Coping: Goal: Level of anxiety will decrease Outcome: Not Progressing   Problem: Pain Management: Goal: General experience of comfort will improve Outcome: Not Progressing

## 2023-04-23 NOTE — Progress Notes (Signed)
Heart Failure Navigator Progress Note  Assessed for Heart & Vascular TOC clinic readiness.  Patient with a scheduled [facility] appointment on 04/30/2023. .   Navigator will sign off at this time.   Rhae Hammock, BSN, Scientist, clinical (histocompatibility and immunogenetics) Only

## 2023-04-24 DIAGNOSIS — I5023 Acute on chronic systolic (congestive) heart failure: Secondary | ICD-10-CM | POA: Diagnosis not present

## 2023-04-24 LAB — CBC
HCT: 46 % (ref 39.0–52.0)
Hemoglobin: 14.9 g/dL (ref 13.0–17.0)
MCH: 28.4 pg (ref 26.0–34.0)
MCHC: 32.4 g/dL (ref 30.0–36.0)
MCV: 87.8 fL (ref 80.0–100.0)
Platelets: 208 10*3/uL (ref 150–400)
RBC: 5.24 MIL/uL (ref 4.22–5.81)
RDW: 15.3 % (ref 11.5–15.5)
WBC: 10 10*3/uL (ref 4.0–10.5)
nRBC: 0 % (ref 0.0–0.2)

## 2023-04-24 LAB — BASIC METABOLIC PANEL
Anion gap: 12 (ref 5–15)
BUN: 30 mg/dL — ABNORMAL HIGH (ref 8–23)
CO2: 29 mmol/L (ref 22–32)
Calcium: 8.6 mg/dL — ABNORMAL LOW (ref 8.9–10.3)
Chloride: 96 mmol/L — ABNORMAL LOW (ref 98–111)
Creatinine, Ser: 1.9 mg/dL — ABNORMAL HIGH (ref 0.61–1.24)
GFR, Estimated: 34 mL/min — ABNORMAL LOW (ref >60)
Glucose, Bld: 215 mg/dL — ABNORMAL HIGH (ref 70–99)
Potassium: 3.1 mmol/L — ABNORMAL LOW (ref 3.5–5.1)
Sodium: 137 mmol/L (ref 135–145)

## 2023-04-24 LAB — GLUCOSE, CAPILLARY
Glucose-Capillary: 203 mg/dL — ABNORMAL HIGH (ref 70–99)
Glucose-Capillary: 261 mg/dL — ABNORMAL HIGH (ref 70–99)
Glucose-Capillary: 253 mg/dL — ABNORMAL HIGH (ref 70–99)
Glucose-Capillary: 203 mg/dL — ABNORMAL HIGH (ref 70–99)

## 2023-04-24 MED ORDER — POTASSIUM CHLORIDE CRYS ER 20 MEQ PO TBCR
40.0000 meq | EXTENDED_RELEASE_TABLET | ORAL | Status: AC
  Administered 2023-04-24 – 2023-04-25 (×3): 40 meq via ORAL
  Filled 2023-04-24 (×3): qty 2

## 2023-04-24 NOTE — Progress Notes (Signed)
PROGRESS NOTE    Eddie Graham  WUJ:[id number] DOB: August 24, 1934 DOA: 04/22/2023 PCP: Andreas Blower., MD   Brief Narrative:  Eddie Graham is a 88 y.o. male who is quite well-known to our facility who has been admitted 4 times in the past 6 weeks with recurrent episodes of chest pain, respiratory distress in the setting of heart failure exacerbation.  He presents yet again with volume overload heart failure exacerbation with associated dyspnea, orthopnea, pleuritic chest pain.   Patient has a known history of chronic heart failure with reduced ejection fraction most recently 25-30% at last hospitalization with known CAD status post CABG 2011, insulin-dependent diabetes type 2, COPD, abdominal aortic aneurysm high-grade bladder cancer status post TURP paroxysmal A-fib/flutter on Eliquis, chronic hypoxic respiratory failure on 4 L nasal cannula around-the-clock, CKD 3B, hypertension, hyperlipidemia, anemia of chronic disease.   Assessment & Plan:   Principal Problem:   Acute exacerbation of congestive heart failure (HCC)  Goals of care  -Daughter [name] notified of admission, currently lives in [place], patient has no local family or support system other than a single friend who helps out "from time to time" -Discussed that patient would likely benefit from assisted living facility, given his recurrent heart failure exacerbation will have PT OT address safe disposition home, discharged to SNF may proved patient and family that a living facility would benefit his overall health.  -We also discussed that should he continue to decline we may need to involve palliative care and hospice team in the near future   Acute respiratory failure without hypoxia Acute heart failure exacerbation Bilateral pleural effusions Anasarca -Continue aggressive diuretics, supplemental oxygen to maintain sats above 90% -No indication to repeat echo given patient was just here last week with  new echo showing reduced EF at 25 to 30% -Holding core measures for heart failure to ensure room for diuretics in regards to renal function as well as blood pressure -I/O net negative; creatinine mildly elevated but stable -Replete potassium   Concern for noncompliance -Profoundly poor historian -unclear if there is underlying dementia not previously diagnosed -Multiple hospitalizations in the past 2 months, patient home only for 4 days after last discharge   COPD exacerbation ruled out -No wheeze noted on my exam or per previous documentation, DuoNeb given at intake without any improvement in symptoms   Hypertension -Holding home medications to leave room for diuretics   CKD 3B -Holding home medications to ensure diuretic tolerance   Hyperlipidemia -Holding statin currently, can resume at discharge   Anemia of chronic disease -Continue iron, this can be held until discharge if patient prefers   A-fib/flutter -Continue Eliquis, currently on low-dose 2.5 mg twice daily however given patient only meets 1 criteria for low-dose with age will transition to 5 mg dosing, follow creatinine and GFR closely given above   CAD status post CABG -Holding statin, Eliquis ongoing no aspirin  DVT prophylaxis:  apixaban (ELIQUIS) tablet 5 mg   Code Status:   Code Status: Full Code  Family Communication: [name](Daughter)  Status is: Inpt  Dispo: The patient is from: Home              Anticipated d/c is to: TBD (SNF pending evaluation)              Anticipated d/c date is: 48-72h              Patient currently not medically stable for discharge  Consultants:  None  Procedures:  None  Antimicrobials:  None   Subjective: No acute issues/events overnight - throat pain improving ("about the same as last week") and does not interfere with PO intake. Pleuritic chest pain resolved.  Objective: Vitals:   04/23/23 1528 04/23/23 1934 04/23/23 2308 04/24/23 0344  BP: 129/66 (!) 136/114  118/73 (!) 125/95  Pulse: 88 88 86 100  Resp: 20 20 16 20   Temp: 98.6 F (37 C) (!) 97.5 F (36.4 C)  97.7 F (36.5 C)  TempSrc: Oral Oral  Oral  SpO2: 94%   92%  Weight:      Height:        Intake/Output Summary (Last 24 hours) at 04/24/2023 0703 Last data filed at 04/23/2023 2300 Gross per 24 hour  Intake 960 ml  Output 3150 ml  Net -2190 ml   Filed Weights   04/22/23 1303 04/22/23 2120 04/23/23 0702  Weight: 95 kg 94.6 kg 94.3 kg    Examination:  General: Patient dyspneic even at rest, 3-4 word sentences Neck:  Without mass or deformity.  Trachea is midline.  Nontender to palpation Lungs: Bilateral rales with profoundly diminished respiratory excursion, no overt wheeze. Heart: Regular but tachycardic, distant Abdomen:  Soft, nontender, edematous, noted ascites. Extremities: 2+ pitting edema bilateral lower extremity to the mid shin  Data Reviewed: I have personally reviewed following labs and imaging studies  CBC: Recent Labs  Lab 04/22/23 1445 04/23/23 0349 04/24/23 0313  WBC 7.7 8.7 10.0  NEUTROABS 5.6  --   --   HGB 14.7 14.8 14.9  HCT 47.3 47.6 46.0  MCV 89.8 88.8 87.8  PLT 197 196 208   Basic Metabolic Panel: Recent Labs  Lab 04/22/23 1445 04/23/23 0349 04/24/23 0313  NA 139 140 137  K 3.4* 3.5 3.1*  CL 104 101 96*  CO2 26 29 29   GLUCOSE 139* 144* 215*  BUN 16 24* 30*  CREATININE 1.21 1.84* 1.90*  CALCIUM 8.6* 8.5* 8.6*   GFR: Estimated Creatinine Clearance: 30 mL/min (A) (by C-G formula based on SCr of 1.9 mg/dL (H)). Liver Function Tests: Recent Labs  Lab 04/22/23 1445  AST 17  ALT 19  ALKPHOS 68  BILITOT 1.2*  PROT 6.4*  ALBUMIN 3.0*   CBG: Recent Labs  Lab 04/23/23 0556 04/23/23 1126 04/23/23 1707 04/23/23 2122 04/24/23 0626  GLUCAP 148* 247* 264* 227* 203*   Radiology Studies: DG Chest 2 View Result Date: 04/22/2023 CLINICAL DATA:  Dyspnea. EXAM: CHEST - 2 VIEW COMPARISON:  April 10, 2023. FINDINGS: Stable  cardiomegaly. Sternotomy wires are noted. Mild bibasilar subsegmental atelectasis is noted with small pleural effusions. IMPRESSION: Mild bibasilar subsegmental atelectasis with small bilateral pleural effusions. Electronically Signed   By: Lupita Raider M.D.   On: 04/22/2023 15:41   Scheduled Meds:  apixaban  5 mg Oral BID   DULoxetine  60 mg Oral Daily   empagliflozin  10 mg Oral QAC breakfast   ferrous sulfate  325 mg Oral Q breakfast   furosemide  80 mg Intravenous Q12H   insulin aspart  0-5 Units Subcutaneous QHS   insulin aspart  0-9 Units Subcutaneous TID WC   pantoprazole  40 mg Oral BID   Semaglutide  14 mg Oral Daily   sodium chloride flush  3-10 mL Intravenous Q12H   traZODone  50 mg Oral QHS   umeclidinium-vilanterol  1 puff Inhalation Daily   Continuous Infusions:     LOS: 2 days   Time spent:  Azucena Fallen, DO [facility]  If 7PM-7AM, please contact night-coverage [email or web address]  04/24/2023, 7:03 AM

## 2023-04-24 NOTE — [place] FL2 (Signed)
[place] MEDICAID FL2 LEVEL OF CARE FORM     IDENTIFICATION  Patient Name: Eddie Graham Birthdate: [birth year] Sex: male Admission Date (Current Location): 04/22/2023  [facility] and [place] Number:  Producer, television/film/video and Address:  [facility], [address] [place], [place] [address]      Provider Number: [id number]  Attending Physician Name and Address:  Azucena Fallen, MD  Relative Name and Phone Number:  Tressia Danas [phone]    Current Level of Care: Hospital Recommended Level of Care: Skilled Nursing Facility Prior Approval Number:    Date Approved/Denied:   PASRR Number: [id number]  Discharge Plan: SNF    Current Diagnoses: Patient Active Problem List   Diagnosis Date Noted   Acute exacerbation of congestive heart failure (HCC) 04/22/2023   Acute on chronic congestive heart failure (HCC) 04/17/2023   Atrial flutter (HCC) 04/12/2023   Chest pain 04/10/2023   Community acquired bacterial pneumonia 02/25/2023   New onset a-fib (HCC) 01/26/2023   Lower GI bleeding 06/12/2022   CAD (coronary artery disease) 06/12/2022   CKD (chronic kidney disease) stage 3, GFR 30-59 ml/min (HCC) 06/12/2022   Acute GI bleeding 06/12/2022   Lesion of urinary bladder 06/12/2022   AAA (abdominal aortic aneurysm) (HCC) 06/12/2022   Diverticulitis 06/12/2022   Diverticulitis of large intestine with abscess without bleeding 03/10/2022   Type 2 diabetes mellitus with complication, without long-term current use of insulin (HCC) 12/20/2021   Sigmoid diverticulitis 12/17/2021   Urethral stricture 12/09/2021   Bladder mass 09/30/2021   AAA (abdominal aortic aneurysm) without rupture (HCC) 08/05/2021    Orientation RESPIRATION BLADDER Height & Weight     Self, Situation, Place  O2 ([place] 4L) Continent, External catheter Weight: 202 lb 13.2 oz (92 kg) Height:  5\' 6"  (167.6 cm)  BEHAVIORAL SYMPTOMS/MOOD NEUROLOGICAL BOWEL NUTRITION STATUS       Incontinent Diet (see dc summary)  AMBULATORY STATUS COMMUNICATION OF NEEDS Skin   Extensive Assist Verbally Normal                       Personal Care Assistance Level of Assistance  Bathing, Feeding, Dressing Bathing Assistance: Limited assistance Feeding assistance: Independent Dressing Assistance: Limited assistance     Functional Limitations Info  Sight, Hearing, Speech Sight Info: Impaired Hearing Info: Adequate Speech Info: Adequate    SPECIAL CARE FACTORS FREQUENCY  OT (By licensed OT), PT (By licensed PT)     PT Frequency: 5x week OT Frequency: 5x week            Contractures Contractures Info: Not present    Additional Factors Info  Code Status, Psychotropic, Insulin Sliding Scale Code Status Info: Full   Psychotropic Info: DULoxetine (CYMBALTA) DR capsule 60 mg    once a day Insulin Sliding Scale Info: see dc summary       Current Medications (04/24/2023):  This is the current hospital active medication list Current Facility-Administered Medications  Medication Dose Route Frequency Provider Last Rate Last Admin   acetaminophen (TYLENOL) tablet 650 mg  650 mg Oral Q4H PRN Azucena Fallen, MD   650 mg at 04/23/23 2015   apixaban (ELIQUIS) tablet 5 mg  5 mg Oral BID Azucena Fallen, MD   5 mg at 04/24/23 0809   Benzocaine (HURRCAINE) 20 % mouth spray   Mouth/Throat QID PRN Azucena Fallen, MD   Given at 04/23/23 2016   DULoxetine (CYMBALTA) DR capsule 60 mg  60 mg Oral Daily Azucena Fallen, MD   60 mg at 04/24/23 0809   empagliflozin (JARDIANCE) tablet 10 mg  10 mg Oral QAC breakfast Azucena Fallen, MD   10 mg at 04/24/23 0809   ferrous sulfate tablet 325 mg  325 mg Oral Q breakfast Azucena Fallen, MD   325 mg at 04/24/23 0809   furosemide (LASIX) injection 80 mg  80 mg Intravenous Q12H Azucena Fallen, MD   80 mg at 04/24/23 0809   gabapentin (NEURONTIN) capsule 300 mg  300 mg Oral QHS PRN Azucena Fallen, MD        insulin aspart (novoLOG) injection 0-5 Units  0-5 Units Subcutaneous QHS Azucena Fallen, MD   2 Units at 04/23/23 2143   insulin aspart (novoLOG) injection 0-9 Units  0-9 Units Subcutaneous TID WC Azucena Fallen, MD   3 Units at 04/24/23 2440   melatonin tablet 5 mg  5 mg Oral QHS PRN Azucena Fallen, MD   5 mg at 04/23/23 2015   ondansetron (ZOFRAN) injection 4 mg  4 mg Intravenous Q6H PRN Azucena Fallen, MD       pantoprazole (PROTONIX) EC tablet 40 mg  40 mg Oral BID Azucena Fallen, MD   40 mg at 04/24/23 0810   sodium chloride flush (NS) 0.9 % injection 3-10 mL  3-10 mL Intravenous Q12H Azucena Fallen, MD   10 mL at 04/24/23 0811   sodium chloride flush (NS) 0.9 % injection 3-10 mL  3-10 mL Intravenous PRN Azucena Fallen, MD       traMADol Janean Sark) tablet 50 mg  50 mg Oral Q6H PRN Azucena Fallen, MD   50 mg at 04/23/23 2219   traZODone (DESYREL) tablet 50 mg  50 mg Oral QHS Azucena Fallen, MD   50 mg at 04/23/23 2015   umeclidinium-vilanterol (ANORO ELLIPTA) 62.5-25 MCG/ACT 1 puff  1 puff Inhalation Daily Azucena Fallen, MD   1 puff at 04/24/23 1027     Discharge Medications: Please see discharge summary for a list of discharge medications.  Relevant Imaging Results:  Relevant Lab Results:   Additional Information SSN [id number]  Carmina Miller, LCSWA

## 2023-04-24 NOTE — TOC Initial Note (Signed)
Transition of Care [facility]) - Initial/Assessment Note    Patient Details  Name: Eddie Graham MRN: [id number] Date of Birth: 1934/10/22  [facility]) CM/SW Contact:    Carmina Miller, LCSWA Phone Number: 04/24/2023, 10:09 AM  Clinical Narrative:                  CSW spoke with pt's daughter [name] via phone, explained SNF recs, she is agreeable and stated she has already spoken with pt about SNF. Explained insurance auth process and Medicare.gov ratings list. [name] lives in [place], states pt has a friend that checks in with him but no other family here. CSW will work pt up for SNF.        Patient Goals and CMS Choice            Expected Discharge Plan and Services                                              Prior Living Arrangements/Services                       Activities of Daily Living   ADL Screening (condition at time of admission) Independently performs ADLs?: No Does the patient have a NEW difficulty with bathing/dressing/toileting/self-feeding that is expected to last >3 days?: Yes (Initiates electronic notice to provider for possible OT consult) Does the patient have a NEW difficulty with getting in/out of bed, walking, or climbing stairs that is expected to last >3 days?: Yes (Initiates electronic notice to provider for possible PT consult) Does the patient have a NEW difficulty with communication that is expected to last >3 days?: No Is the patient deaf or have difficulty hearing?: Yes Does the patient have difficulty seeing, even when wearing glasses/contacts?: Yes Does the patient have difficulty concentrating, remembering, or making decisions?: Yes  Permission Sought/Granted                  Emotional Assessment              Admission diagnosis:  Acute exacerbation of congestive heart failure (HCC) [I50.9] Acute on chronic congestive heart failure, unspecified heart failure type [facility]) [I50.9] Patient  Active Problem List   Diagnosis Date Noted   Acute exacerbation of congestive heart failure (HCC) 04/22/2023   Acute on chronic congestive heart failure (HCC) 04/17/2023   Atrial flutter (HCC) 04/12/2023   Chest pain 04/10/2023   Community acquired bacterial pneumonia 02/25/2023   New onset a-fib (HCC) 01/26/2023   Lower GI bleeding 06/12/2022   CAD (coronary artery disease) 06/12/2022   CKD (chronic kidney disease) stage 3, GFR 30-59 ml/min (HCC) 06/12/2022   Acute GI bleeding 06/12/2022   Lesion of urinary bladder 06/12/2022   AAA (abdominal aortic aneurysm) (HCC) 06/12/2022   Diverticulitis 06/12/2022   Diverticulitis of large intestine with abscess without bleeding 03/10/2022   Type 2 diabetes mellitus with complication, without long-term current use of insulin (HCC) 12/20/2021   Sigmoid diverticulitis 12/17/2021   Urethral stricture 12/09/2021   Bladder mass 09/30/2021   AAA (abdominal aortic aneurysm) without rupture (HCC) 08/05/2021   PCP:  Andreas Blower., MD Pharmacy:   [facility] [place], [place] - [address] [place] [address] Phone: [phone] Fax: [phone]  [id number] [place], [place] -  [address] AT [facility] OF [facility] & [address] [place] [address] Phone: [phone] Fax: [phone]  [facility] [address] [place] [address] Phone: [phone] Fax: [phone]  [facility] [address] [place] [address] Phone: [phone] Fax: [phone]  [facility] #[id number] [place], [place] - [address] AT [facility] OF [address] & [address] [place] [address] Phone: [phone] Fax: [phone]     Social Drivers of Health (SDOH) Social History: SDOH Screenings   Food Insecurity: Food Insecurity Present (04/23/2023)  Housing: Low  Risk  (04/23/2023)  Recent Concern: Housing - High Risk (03/01/2023)  Transportation Needs: Unmet Transportation Needs (04/23/2023)  Utilities: Not At Risk (04/23/2023)  Financial Resource Strain: Low Risk  (08/29/2021)  Tobacco Use: Low Risk  (04/22/2023)   SDOH Interventions:     Readmission Risk Interventions     No data to display

## 2023-04-25 DIAGNOSIS — I5023 Acute on chronic systolic (congestive) heart failure: Secondary | ICD-10-CM | POA: Diagnosis not present

## 2023-04-25 LAB — GLUCOSE, CAPILLARY
Glucose-Capillary: 229 mg/dL — ABNORMAL HIGH (ref 70–99)
Glucose-Capillary: 217 mg/dL — ABNORMAL HIGH (ref 70–99)
Glucose-Capillary: 229 mg/dL — ABNORMAL HIGH (ref 70–99)
Glucose-Capillary: 233 mg/dL — ABNORMAL HIGH (ref 70–99)

## 2023-04-25 LAB — CBC
HCT: 45.4 % (ref 39.0–52.0)
Hemoglobin: 14.6 g/dL (ref 13.0–17.0)
MCH: 28 pg (ref 26.0–34.0)
MCHC: 32.2 g/dL (ref 30.0–36.0)
MCV: 87 fL (ref 80.0–100.0)
Platelets: 209 10*3/uL (ref 150–400)
RBC: 5.22 MIL/uL (ref 4.22–5.81)
RDW: 15.4 % (ref 11.5–15.5)
WBC: 9.3 10*3/uL (ref 4.0–10.5)
nRBC: 0 % (ref 0.0–0.2)

## 2023-04-25 LAB — BASIC METABOLIC PANEL
Anion gap: 9 (ref 5–15)
BUN: 37 mg/dL — ABNORMAL HIGH (ref 8–23)
CO2: 31 mmol/L (ref 22–32)
Calcium: 8.6 mg/dL — ABNORMAL LOW (ref 8.9–10.3)
Chloride: 95 mmol/L — ABNORMAL LOW (ref 98–111)
Creatinine, Ser: 2.02 mg/dL — ABNORMAL HIGH (ref 0.61–1.24)
GFR, Estimated: 32 mL/min — ABNORMAL LOW (ref >60)
Glucose, Bld: 285 mg/dL — ABNORMAL HIGH (ref 70–99)
Potassium: 3.9 mmol/L (ref 3.5–5.1)
Sodium: 135 mmol/L (ref 135–145)

## 2023-04-25 MED ORDER — FUROSEMIDE 40 MG PO TABS
40.0000 mg | ORAL_TABLET | Freq: Two times a day (BID) | ORAL | Status: DC
  Administered 2023-04-25 – 2023-04-27 (×6): 40 mg via ORAL
  Filled 2023-04-25 (×6): qty 1

## 2023-04-25 MED ORDER — APIXABAN 2.5 MG PO TABS
2.5000 mg | ORAL_TABLET | Freq: Two times a day (BID) | ORAL | Status: DC
  Administered 2023-04-25 – 2023-05-03 (×16): 2.5 mg via ORAL
  Filled 2023-04-25 (×16): qty 1

## 2023-04-25 NOTE — Progress Notes (Signed)
PROGRESS NOTE    Tilton Cata  ZOX:[id number] DOB: Apr 03, 1935 DOA: 04/22/2023 PCP: Andreas Blower., MD   Brief Narrative:  Eddie Graham is a 88 y.o. male who is quite well-known to our facility who has been admitted 4 times in the past 6 weeks with recurrent episodes of chest pain, respiratory distress in the setting of heart failure exacerbation.  He presents yet again with volume overload heart failure exacerbation with associated dyspnea, orthopnea, pleuritic chest pain.   Patient has a known history of chronic heart failure with reduced ejection fraction most recently 25-30% at last hospitalization with known CAD status post CABG 2011, insulin-dependent diabetes type 2, COPD, abdominal aortic aneurysm high-grade bladder cancer status post TURP paroxysmal A-fib/flutter on Eliquis, chronic hypoxic respiratory failure on 4 L nasal cannula around-the-clock, CKD 3B, hypertension, hyperlipidemia, anemia of chronic disease.   Assessment & Plan:   Principal Problem:   Acute exacerbation of congestive heart failure (HCC)  Goals of care  -Daughter [name], who currently lives in [place], updated - patient has no local family or support system other than a single friend who helps out "from time to time" -Discussed that patient would likely benefit from assisted living facility, given his recurrent heart failure exacerbation will have PT OT address safe disposition home, discharging to SNF may prove to patient and family that a living facility would benefit his overall health/quality of life.  -We also discussed that should he continue to decline we may need to involve palliative care and hospice team in the near future   Acute respiratory failure without hypoxia Acute heart failure exacerbation Bilateral pleural effusions Anasarca -Wean off IV lasix(80mg ) now on 40mg  BID PO -> follow volume status - may need to wean further given prior lasix dosing 20mg  daily -No  indication to repeat echo given patient was just here last week with new echo showing reduced EF at 25 to 30% -Holding core measures for heart failure to ensure room for diuretics in regards to renal function as well as blood pressure -I/O net negative; creatinine mildly elevated   Concern for noncompliance -Profoundly poor historian -unclear if there is underlying dementia not previously diagnosed -Multiple hospitalizations in the past 2 months, patient home only for 4 days after last discharge   COPD exacerbation ruled out -No wheeze noted on my exam or per previous documentation, DuoNeb given at intake without any improvement in symptoms   Hypertension -Holding home medications to leave room for diuretics   CKD 3B -Holding home medications to ensure diuretic tolerance   Hyperlipidemia -Holding statin currently, can resume at discharge   Anemia of chronic disease -Continue iron, this can be held until discharge if patient prefers   A-fib/flutter -Continue Eliquis, previously at home on low-dose 2.5 mg twice daily however given patient only meets 1 criteria for low-dose with age will transition to 5 mg dosing, follow creatinine and GFR closely given above as it remains quite labile.   CAD status post CABG -Holding statin, Eliquis ongoing no aspirin  DVT prophylaxis:  apixaban (ELIQUIS) tablet 5 mg   Code Status:   Code Status: Full Code  Family Communication: [name](Daughter)  Status is: Inpt  Dispo: The patient is from: Home              Anticipated d/c is to: SNF              Anticipated d/c date is: 24 to 48 hours  Patient currently not medically stable for discharge  Consultants:  None  Procedures:  None  Antimicrobials:  None   Subjective: No acute issues or events overnight, dyspnea and throat pain are markedly improving on current regimen.  He otherwise denies nausea vomiting diarrhea constipation headache fevers chills or chest  pain  Objective: Vitals:   04/24/23 1949 04/24/23 2326 04/25/23 0339 04/25/23 0542  BP: 121/75 (!) 140/68 118/73   Pulse: 92 92 88   Resp: 19 17    Temp: 98.3 F (36.8 C) 97.9 F (36.6 C) 98.1 F (36.7 C)   TempSrc: Oral Oral Oral   SpO2: 97% 98% 92%   Weight:    91.4 kg  Height:        Intake/Output Summary (Last 24 hours) at 04/25/2023 0710 Last data filed at 04/25/2023 0000 Gross per 24 hour  Intake 860 ml  Output 3650 ml  Net -2790 ml   Filed Weights   04/23/23 0702 04/24/23 0702 04/25/23 0542  Weight: 94.3 kg 92 kg 91.4 kg    Examination:  General: Patient dyspneic even at rest, 3-4 word sentences Neck:  Without mass or deformity.  Trachea is midline.  Nontender to palpation Lungs: Bilateral rales with profoundly diminished respiratory excursion, no overt wheeze. Heart: Regular but tachycardic, distant Abdomen:  Soft, nontender, edematous, noted ascites. Extremities: Scant to 1+ pitting edema bilateral lower extremity to the distal shin  Data Reviewed: I have personally reviewed following labs and imaging studies  CBC: Recent Labs  Lab 04/22/23 1445 04/23/23 0349 04/24/23 0313 04/25/23 0240  WBC 7.7 8.7 10.0 9.3  NEUTROABS 5.6  --   --   --   HGB 14.7 14.8 14.9 14.6  HCT 47.3 47.6 46.0 45.4  MCV 89.8 88.8 87.8 87.0  PLT 197 196 208 209   Basic Metabolic Panel: Recent Labs  Lab 04/22/23 1445 04/23/23 0349 04/24/23 0313 04/25/23 0240  NA 139 140 137 135  K 3.4* 3.5 3.1* 3.9  CL 104 101 96* 95*  CO2 26 29 29 31   GLUCOSE 139* 144* 215* 285*  BUN 16 24* 30* 37*  CREATININE 1.21 1.84* 1.90* 2.02*  CALCIUM 8.6* 8.5* 8.6* 8.6*   GFR: Estimated Creatinine Clearance: 27.8 mL/min (A) (by C-G formula based on SCr of 2.02 mg/dL (H)). Liver Function Tests: Recent Labs  Lab 04/22/23 1445  AST 17  ALT 19  ALKPHOS 68  BILITOT 1.2*  PROT 6.4*  ALBUMIN 3.0*   CBG: Recent Labs  Lab 04/24/23 0626 04/24/23 1108 04/24/23 1514 04/24/23 2042  04/25/23 0539  GLUCAP 203* 261* 253* 203* 229*   Radiology Studies: No results found.  Scheduled Meds:  apixaban  5 mg Oral BID   DULoxetine  60 mg Oral Daily   empagliflozin  10 mg Oral QAC breakfast   ferrous sulfate  325 mg Oral Q breakfast   furosemide  80 mg Intravenous Q12H   insulin aspart  0-5 Units Subcutaneous QHS   insulin aspart  0-9 Units Subcutaneous TID WC   pantoprazole  40 mg Oral BID   sodium chloride flush  3-10 mL Intravenous Q12H   traZODone  50 mg Oral QHS   umeclidinium-vilanterol  1 puff Inhalation Daily   Continuous Infusions:     LOS: 3 days   Time spent:  Azucena Fallen, DO [facility]  If 7PM-7AM, please contact night-coverage [email or web address]  04/25/2023, 7:10 AM

## 2023-04-25 NOTE — Plan of Care (Signed)
°  Problem: Education: Goal: Ability to describe self-care measures that may prevent or decrease complications (Diabetes Survival Skills Education) will improve Outcome: Progressing Goal: Individualized Educational Video(s) Outcome: Progressing   Problem: Coping: Goal: Ability to adjust to condition or change in health will improve Outcome: Progressing   Problem: Fluid Volume: Goal: Ability to maintain a balanced intake and output will improve Outcome: Progressing   Problem: Health Behavior/Discharge Planning: Goal: Ability to identify and utilize available resources and services will improve Outcome: Progressing Goal: Ability to manage health-related needs will improve Outcome: Progressing   Problem: Education: Goal: Knowledge of General Education information will improve Description: Including pain rating scale, medication(s)/side effects and non-pharmacologic comfort measures Outcome: Progressing   Problem: Health Behavior/Discharge Planning: Goal: Ability to manage health-related needs will improve Outcome: Progressing   Problem: Clinical Measurements: Goal: Ability to maintain clinical measurements within normal limits will improve Outcome: Progressing Goal: Will remain free from infection Outcome: Progressing Goal: Diagnostic test results will improve Outcome: Progressing Goal: Respiratory complications will improve Outcome: Progressing Goal: Cardiovascular complication will be avoided Outcome: Progressing

## 2023-04-25 NOTE — Progress Notes (Signed)
PROGRESS NOTE    Eddie Graham  ZOX:[id number] DOB: December 19, 1934 DOA: 04/22/2023 PCP: Andreas Blower., MD   Brief Narrative:  Eddie Graham is a 88 y.o. male who is quite well-known to our facility who has been admitted 4 times in the past 6 weeks with recurrent episodes of chest pain, respiratory distress in the setting of heart failure exacerbation.  He presents yet again with volume overload heart failure exacerbation with associated dyspnea, orthopnea, pleuritic chest pain.   Patient has a known history of chronic heart failure with reduced ejection fraction most recently 25-30% at last hospitalization with known CAD status post CABG 2011, insulin-dependent diabetes type 2, COPD, abdominal aortic aneurysm high-grade bladder cancer status post TURP paroxysmal A-fib/flutter on Eliquis, chronic hypoxic respiratory failure on 4 L nasal cannula around-the-clock, CKD 3B, hypertension, hyperlipidemia, anemia of chronic disease.   Assessment & Plan:   Principal Problem:   Acute exacerbation of congestive heart failure (HCC)  Goals of care  -Daughter Eddie Graham, who currently lives in [place], updated - patient has no local family or support system other than a single friend who helps out "from time to time" -Discussed that patient would likely benefit from assisted living facility, given his recurrent heart failure exacerbation will have PT OT address safe disposition home, discharging to SNF may prove to patient and family that a living facility would benefit his overall health/quality of life.  -We also discussed that should he continue to decline we may need to involve palliative care and hospice team in the near future   Acute respiratory failure without hypoxia Acute heart failure exacerbation Bilateral pleural effusions Anasarca -Wean off IV lasix(80mg ) now on 40mg  BID PO -> follow volume status - may need to wean further given prior lasix dosing 20mg  daily -No  indication to repeat echo given patient was just here last week with new echo showing reduced EF at 25 to 30% -Holding core measures for heart failure to ensure room for diuretics in regards to renal function as well as blood pressure -I/O net negative; creatinine mildly elevated   Concern for noncompliance -Profoundly poor historian -unclear if there is underlying dementia not previously diagnosed -Multiple hospitalizations in the past 2 months, patient home only for 4 days after last discharge   COPD exacerbation ruled out -No wheeze noted on my exam or per previous documentation, DuoNeb given at intake without any improvement in symptoms   Hypertension -Holding home medications to leave room for diuretics   CKD 3B -Holding home medications to ensure diuretic tolerance   Hyperlipidemia -Holding statin currently, can resume at discharge   Anemia of chronic disease -Continue iron, this can be held until discharge if patient prefers   A-fib/flutter -Continue Eliquis, change back to home dose 2.5 mg twice daily, follow creatinine and GFR closely given above as it remains quite labile   CAD status post CABG -Holding statin, Eliquis ongoing no aspirin  DVT prophylaxis:  apixaban (ELIQUIS) tablet 5 mg   Code Status:   Code Status: Full Code  Family Communication: Eddie Graham(Daughter)  Status is: Inpt  Dispo: The patient is from: Home              Anticipated d/c is to: SNF              Anticipated d/c date is: 24 to 48 hours              Patient currently not medically stable for discharge  Consultants:  None  Procedures:  None  Antimicrobials:  None   Subjective: No acute issues or events overnight, dyspnea and throat pain are markedly improving on current regimen.  He otherwise denies nausea vomiting diarrhea constipation headache fevers chills or chest pain  Objective: Vitals:   04/25/23 0542 04/25/23 0810 04/25/23 0811 04/25/23 1144  BP:  102/71  118/66  Pulse:   98  91  Resp:  18  18  Temp:  98.2 F (36.8 C)  97.7 F (36.5 C)  TempSrc:  Oral  Oral  SpO2:  94% 94% 92%  Weight: 91.4 kg     Height:        Intake/Output Summary (Last 24 hours) at 04/25/2023 1157 Last data filed at 04/25/2023 0810 Gross per 24 hour  Intake 480 ml  Output 3400 ml  Net -2920 ml   Filed Weights   04/23/23 0702 04/24/23 0702 04/25/23 0542  Weight: 94.3 kg 92 kg 91.4 kg    Examination:  General: Patient dyspneic even at rest, 3-4 word sentences Neck:  Without mass or deformity.  Trachea is midline.  Nontender to palpation Lungs: Bilateral rales with profoundly diminished respiratory excursion, no overt wheeze. Heart: Regular but tachycardic, distant Abdomen:  Soft, nontender, edematous, noted ascites. Extremities: Scant to 1+ pitting edema bilateral lower extremity to the distal shin  Data Reviewed: I have personally reviewed following labs and imaging studies  CBC: Recent Labs  Lab 04/22/23 1445 04/23/23 0349 04/24/23 0313 04/25/23 0240  WBC 7.7 8.7 10.0 9.3  NEUTROABS 5.6  --   --   --   HGB 14.7 14.8 14.9 14.6  HCT 47.3 47.6 46.0 45.4  MCV 89.8 88.8 87.8 87.0  PLT 197 196 208 209   Basic Metabolic Panel: Recent Labs  Lab 04/22/23 1445 04/23/23 0349 04/24/23 0313 04/25/23 0240  NA 139 140 137 135  K 3.4* 3.5 3.1* 3.9  CL 104 101 96* 95*  CO2 26 29 29 31   GLUCOSE 139* 144* 215* 285*  BUN 16 24* 30* 37*  CREATININE 1.21 1.84* 1.90* 2.02*  CALCIUM 8.6* 8.5* 8.6* 8.6*   GFR: Estimated Creatinine Clearance: 27.8 mL/min (A) (by C-G formula based on SCr of 2.02 mg/dL (H)). Liver Function Tests: Recent Labs  Lab 04/22/23 1445  AST 17  ALT 19  ALKPHOS 68  BILITOT 1.2*  PROT 6.4*  ALBUMIN 3.0*   CBG: Recent Labs  Lab 04/24/23 1108 04/24/23 1514 04/24/23 2042 04/25/23 0539 04/25/23 1150  GLUCAP 261* 253* 203* 229* 217*   Radiology Studies: No results found.  Scheduled Meds:  apixaban  5 mg Oral BID   DULoxetine  60 mg  Oral Daily   empagliflozin  10 mg Oral QAC breakfast   ferrous sulfate  325 mg Oral Q breakfast   furosemide  40 mg Oral BID   insulin aspart  0-5 Units Subcutaneous QHS   insulin aspart  0-9 Units Subcutaneous TID WC   pantoprazole  40 mg Oral BID   sodium chloride flush  3-10 mL Intravenous Q12H   traZODone  50 mg Oral QHS   umeclidinium-vilanterol  1 puff Inhalation Daily   Continuous Infusions:     LOS: 3 days   Time spent:  Azucena Fallen, DO [facility]  If 7PM-7AM, please contact night-coverage [email or web address]  04/25/2023, 11:57 AM

## 2023-04-26 DIAGNOSIS — I5021 Acute systolic (congestive) heart failure: Secondary | ICD-10-CM | POA: Diagnosis not present

## 2023-04-26 LAB — CBC
HCT: 44.9 % (ref 39.0–52.0)
Hemoglobin: 14.5 g/dL (ref 13.0–17.0)
MCH: 28.2 pg (ref 26.0–34.0)
MCHC: 32.3 g/dL (ref 30.0–36.0)
MCV: 87.2 fL (ref 80.0–100.0)
Platelets: 192 10*3/uL (ref 150–400)
RBC: 5.15 MIL/uL (ref 4.22–5.81)
RDW: 15.3 % (ref 11.5–15.5)
WBC: 8.2 10*3/uL (ref 4.0–10.5)
nRBC: 0 % (ref 0.0–0.2)

## 2023-04-26 LAB — GLUCOSE, CAPILLARY
Glucose-Capillary: 206 mg/dL — ABNORMAL HIGH (ref 70–99)
Glucose-Capillary: 265 mg/dL — ABNORMAL HIGH (ref 70–99)
Glucose-Capillary: 258 mg/dL — ABNORMAL HIGH (ref 70–99)
Glucose-Capillary: 197 mg/dL — ABNORMAL HIGH (ref 70–99)

## 2023-04-26 LAB — BASIC METABOLIC PANEL
Anion gap: 10 (ref 5–15)
BUN: 38 mg/dL — ABNORMAL HIGH (ref 8–23)
CO2: 28 mmol/L (ref 22–32)
Calcium: 8.5 mg/dL — ABNORMAL LOW (ref 8.9–10.3)
Chloride: 100 mmol/L (ref 98–111)
Creatinine, Ser: 1.99 mg/dL — ABNORMAL HIGH (ref 0.61–1.24)
GFR, Estimated: 32 mL/min — ABNORMAL LOW (ref >60)
Glucose, Bld: 210 mg/dL — ABNORMAL HIGH (ref 70–99)
Potassium: 3.4 mmol/L — ABNORMAL LOW (ref 3.5–5.1)
Sodium: 138 mmol/L (ref 135–145)

## 2023-04-26 MED ORDER — METOPROLOL SUCCINATE ER 25 MG PO TB24
12.5000 mg | ORAL_TABLET | Freq: Every day | ORAL | Status: DC
  Administered 2023-04-26: 12.5 mg via ORAL
  Filled 2023-04-26: qty 1

## 2023-04-26 MED ORDER — INSULIN GLARGINE-YFGN 100 UNIT/ML ~~LOC~~ SOLN
12.0000 [IU] | Freq: Every day | SUBCUTANEOUS | Status: DC
  Administered 2023-04-26 – 2023-04-27 (×2): 12 [IU] via SUBCUTANEOUS
  Filled 2023-04-26 (×2): qty 0.12

## 2023-04-26 MED ORDER — MONTELUKAST SODIUM 10 MG PO TABS
10.0000 mg | ORAL_TABLET | Freq: Every day | ORAL | Status: DC
  Administered 2023-04-26 – 2023-05-02 (×7): 10 mg via ORAL
  Filled 2023-04-26 (×7): qty 1

## 2023-04-26 MED ORDER — POTASSIUM CHLORIDE CRYS ER 20 MEQ PO TBCR
30.0000 meq | EXTENDED_RELEASE_TABLET | ORAL | Status: AC
Start: 2023-04-26 — End: 2023-04-26
  Administered 2023-04-26 (×2): 30 meq via ORAL
  Filled 2023-04-26 (×2): qty 1

## 2023-04-26 NOTE — Plan of Care (Signed)
  Problem: Education: Goal: Ability to describe self-care measures that may prevent or decrease complications (Diabetes Survival Skills Education) will improve Outcome: Progressing   Problem: Coping: Goal: Ability to adjust to condition or change in health will improve Outcome: Progressing   Problem: Fluid Volume: Goal: Ability to maintain a balanced intake and output will improve Outcome: Progressing   Problem: Nutritional: Goal: Maintenance of adequate nutrition will improve Outcome: Progressing   Problem: Education: Goal: Knowledge of General Education information will improve Description: Including pain rating scale, medication(s)/side effects and non-pharmacologic comfort measures Outcome: Progressing   Problem: Clinical Measurements: Goal: Respiratory complications will improve Outcome: Progressing   Problem: Activity: Goal: Risk for activity intolerance will decrease Outcome: Progressing   Problem: Elimination: Goal: Will not experience complications related to bowel motility Outcome: Progressing   Problem: Safety: Goal: Ability to remain free from injury will improve Outcome: Progressing   Problem: Metabolic: Goal: Ability to maintain appropriate glucose levels will improve Outcome: Not Progressing   Problem: Nutritional: Goal: Progress toward achieving an optimal weight will improve Outcome: Not Progressing

## 2023-04-26 NOTE — Progress Notes (Signed)
Mobility Specialist Progress Note:   04/26/23 1237  Mobility  Activity Ambulated with assistance in hallway  Level of Assistance Standby assist, set-up cues, supervision of patient - no hands on  Assistive Device Front wheel walker  Distance Ambulated (ft) 470 ft  Activity Response Tolerated well  Mobility Referral Yes  Mobility visit 1 Mobility  Mobility Specialist Start Time (ACUTE ONLY) 1220  Mobility Specialist Stop Time (ACUTE ONLY) 1230  Mobility Specialist Time Calculation (min) (ACUTE ONLY) 10 min   Pt received in bed, agreeable to mobility. SB assist for all OOB activity. VSS on 4 L. Pt denied any discomfort during ambulation, asx throughout. Pt returned to bed with NT present in room.   Leory Plowman  Mobility Specialist Please contact via [employer] office at [phone]

## 2023-04-26 NOTE — Progress Notes (Addendum)
Dr. Michael Litter NOTE    Toluwani Sanchez-Feliciano  OZD:[id number] DOB: 1934/06/12 DOA: 04/22/2023 PCP: Andreas Blower., MD    Brief Narrative:   Eddie Graham is a 88 y.o. male with past medical history significant for chronic systolic congestive heart failure (LVEF 25-30%), CAD s/p CABG 2011, paroxysmal atrial fibrillation/flutter on Eliquis, HTN, HLD, COPD, chronic hypoxic respiratory failure on 4 L nasal cannula at baseline, CKD stage IIIb, anemia of chronic medical/renal disease, abdominal aortic aneurysm, high-grade bladder cancer s/p TURP who presented to [facility] ED on 12/26 from home via EMS with complaints of chest pain and shortness of breath.  Patient also complaining of orthopnea, pleuritic chest pain.  On EMS arrival, patient was found off of his oxygen with SpO2 in the 70s.  In the ED, patient was noted to be profoundly dyspneic.  Temperature 97.8 F, HR 69, RR 18, BP 152/73, SpO2 100% on 3 L nasal cannula.  WBC 7.7, hemoglobin 14.7, platelet count 197.  Sodium 139, potassium 3.4, chloride 104, CO2 26, glucose 139, BUN 16, creatinine 1.21.  AST 17, ALT 19, total bilirubin 1.2.  BNP 841.2.  High sensitive troponin 23 followed by 19.  D-dimer 0.80.  COVID/influenza/RSV PCR negative.  Chest x-ray with mild bibasilar subsegmental atelectasis with small bilateral pleural effusions.  Patient was given a breathing treatment by EDP.  TRH consulted for admission for further evaluation management of decompensated CHF in setting of medication noncompliance/nonadherence.  Patient now with 4 admissions in the past 6 weeks, last discharged on 04/17/2023 to home.  Suspect medical noncompliance.   Assessment & Plan:   Acute respiratory failure without hypoxia Acute on chronic systolic congestive heart failure exacerbation Essential hypertension Patient presenting to ED with progressive dyspnea, orthopnea, lower extremity edema in the setting of medication noncompliance, nonadherence to his  chronic home oxygen.  Was found by EMS with SpO2 in the 70s.  Workup in the ED, patient was afebrile without leukocytosis, BNP elevated with concerns of vascular congestion on chest x-ray.  COVID/influenza/RSV PCR negative.  TTE 04/12/2023 with LVEF 25-30%, LV with global hypokinesis, biatrial enlargement, IVC dilated. -- net negative 1L past 24h and net negative 9.1L since admission -- Metoprolol succinate 12.5 g p.o. daily (reduced from home dose 100mg /d) -- Furosemide 40 mg p.o. twice daily -- Strict I's and O's Daily weights  Hypokalemia Potassium 3.4, will replete. -- Repeat electrolytes in a.m. to include magnesium  Chronic hypoxic respiratory failure on home O2 COPD, not in exacerbation -- Anoro Ellipta 1 puff daily -- Singulair 10 mg p.o. daily -- Albuterol neb every 4 hours.  Wheezing/shortness of breath -- Continue supplemental oxygen, maintain SpO2 greater than 88%, on 4 L nasal cannula baseline  Paroxysmal atrial fibrillation -- Metoprolol succinate 12.5 mg p.o. daily -- Eliquis 2.5 g p.o. twice daily  Hyperlipidemia -- Crestor 40 mg p.o. daily  Type 2 diabetes mellitus, with hyperglycemia Hemoglobin A1c 7.4 01/20/2023.  Home regimen includes Lantus 24 units [place] daily and Jardiance -- Semglee 12u [place] daily -- Sensitive SSI for coverage -- Jardiance 10 mg p.o. daily -- CBG before every meal/at bedtime  CKD stage IIIb Creatinine 1.21>1.84>1.90>2.02>1.99, stable. -- BMP daily  CAD s/p CABG -- Crestor 40 mg p.o. nightly  Anemia of chronic medical/renal disease Iron deficiency anemia Hemoglobin 14.5, stable. -- Ferrous sulfate 325 mg p.o. daily  Abdominal aortic aneurysm Continue surveillance  Hx bladder cancer s/p TURP Outpatient follow-up with urology  GERD --Protonix 40 mg p.o. daily  Weakness/deconditioning/debility: -- PT/OT recommend SNF  placement, continue therapy efforts while inpatient -- TOC consulted for placement   DVT prophylaxis: apixaban  (ELIQUIS) tablet 2.5 mg Start: 04/25/23 2200 apixaban (ELIQUIS) tablet 2.5 mg    Code Status: Full Code Family Communication: No family present at bedside this morning  Disposition Plan:  Level of care: Progressive Status is: Inpatient Remains inpatient appropriate because: Pending SNF placement, medically stable for discharge once bed available    Consultants:  None  Procedures:  none  Antimicrobials:  none   Subjective: Patient seen examined bedside, resting calmly.  Lying in bed.  No complaints this morning.  Remains on 4 L oxygen, with SpO2 97% which is his baseline.  Awaiting placement.  About to work with therapy this morning.  No other specific complaints, concerns or questions at this time.  Denies headache, no dizziness, no chest pain, no palpitations, no shortness of breath, no abdominal pain, no fever/chills/night sweats, no nausea/vomiting/diarrhea, no focal weakness, no fatigue, no paresthesia.  No acute events overnight per nursing staff.  Objective: Vitals:   04/26/23 0605 04/26/23 0747 04/26/23 0902 04/26/23 1110  BP:  128/89  132/80  Pulse:  (!) 126 95 87  Resp:  20 20 20   Temp:  97.6 F (36.4 C)  (!) 97.5 F (36.4 C)  TempSrc:  Oral  Oral  SpO2:  97% 92% 97%  Weight: 91.8 kg     Height:        Intake/Output Summary (Last 24 hours) at 04/26/2023 1525 Last data filed at 04/26/2023 1051 Gross per 24 hour  Intake 120 ml  Output 1200 ml  Net -1080 ml   Filed Weights   04/24/23 0702 04/25/23 0542 04/26/23 0605  Weight: 92 kg 91.4 kg 91.8 kg    Examination:  Physical Exam: GEN: NAD, alert and oriented x 3, chronically ill appearance, appears older than stated age HEENT: NCAT, PERRL, EOMI, sclera clear, MMM PULM: Breath sounds slight diminished bilateral bases, noted that late expiratory wheezing bilateral upper lung fields, no crackles, normal respiratory effort without accessory muscle use, on 4 L nasal cannula which is at baseline CV: Tachycardic,  regular rhythm w/o M/G/R GI: abd soft, NTND, NABS, no R/G/M MSK: Trace bilateral lower extremity peripheral edema, muscle strength globally intact 5/5 bilateral upper/lower extremities NEURO: CN II-XII intact, no focal deficits, sensation to light touch intact PSYCH: normal mood/affect Integumentary: dry/intact, no rashes or wounds    Data Reviewed: I have personally reviewed following labs and imaging studies  CBC: Recent Labs  Lab 04/22/23 1445 04/23/23 0349 04/24/23 0313 04/25/23 0240 04/26/23 0305  WBC 7.7 8.7 10.0 9.3 8.2  NEUTROABS 5.6  --   --   --   --   HGB 14.7 14.8 14.9 14.6 14.5  HCT 47.3 47.6 46.0 45.4 44.9  MCV 89.8 88.8 87.8 87.0 87.2  PLT 197 196 208 209 192   Basic Metabolic Panel: Recent Labs  Lab 04/22/23 1445 04/23/23 0349 04/24/23 0313 04/25/23 0240 04/26/23 0305  NA 139 140 137 135 138  K 3.4* 3.5 3.1* 3.9 3.4*  CL 104 101 96* 95* 100  CO2 26 29 29 31 28   GLUCOSE 139* 144* 215* 285* 210*  BUN 16 24* 30* 37* 38*  CREATININE 1.21 1.84* 1.90* 2.02* 1.99*  CALCIUM 8.6* 8.5* 8.6* 8.6* 8.5*   GFR: Estimated Creatinine Clearance: 28.3 mL/min (A) (by C-G formula based on SCr of 1.99 mg/dL (H)). Liver Function Tests: Recent Labs  Lab 04/22/23 1445  AST 17  ALT 19  ALKPHOS 68  BILITOT 1.2*  PROT 6.4*  ALBUMIN 3.0*   No results for input(s): "LIPASE", "AMYLASE" in the last 168 hours. No results for input(s): "AMMONIA" in the last 168 hours. Coagulation Profile: No results for input(s): "INR", "PROTIME" in the last 168 hours. Cardiac Enzymes: No results for input(s): "CKTOTAL", "CKMB", "CKMBINDEX", "TROPONINI" in the last 168 hours. BNP (last 3 results) No results for input(s): "PROBNP" in the last 8760 hours. HbA1C: No results for input(s): "HGBA1C" in the last 72 hours. CBG: Recent Labs  Lab 04/25/23 1150 04/25/23 1715 04/25/23 2055 04/26/23 0603 04/26/23 1109  GLUCAP 217* 229* 233* 206* 265*   Lipid Profile: No results for  input(s): "CHOL", "HDL", "LDLCALC", "TRIG", "CHOLHDL", "LDLDIRECT" in the last 72 hours. Thyroid Function Tests: No results for input(s): "TSH", "T4TOTAL", "FREET4", "T3FREE", "THYROIDAB" in the last 72 hours. Anemia Panel: No results for input(s): "VITAMINB12", "FOLATE", "FERRITIN", "TIBC", "IRON", "RETICCTPCT" in the last 72 hours. Sepsis Labs: No results for input(s): "PROCALCITON", "LATICACIDVEN" in the last 168 hours.  Recent Results (from the past 240 hours)  Resp panel by RT-PCR (RSV, Flu A&B, Covid) Anterior Nasal Swab     Status: None   Collection Time: 04/22/23  2:45 PM   Specimen: Anterior Nasal Swab  Result Value Ref Range Status   SARS Coronavirus 2 by RT PCR NEGATIVE NEGATIVE Final   Influenza A by PCR NEGATIVE NEGATIVE Final   Influenza B by PCR NEGATIVE NEGATIVE Final    Comment: (NOTE) The Xpert Xpress SARS-CoV-2/FLU/RSV plus assay is intended as an aid in the diagnosis of influenza from Nasopharyngeal swab specimens and should not be used as a sole basis for treatment. Nasal washings and aspirates are unacceptable for Xpert Xpress SARS-CoV-2/FLU/RSV testing.  Fact Sheet for Patients: BloggerCourse.com  Fact Sheet for Healthcare Providers: SeriousBroker.it  This test is not yet approved or cleared by the [place] FDA and has been authorized for detection and/or diagnosis of SARS-CoV-2 by FDA under an Emergency Use Authorization (EUA). This EUA will remain in effect (meaning this test can be used) for the duration of the COVID-19 declaration under Section 564(b)(1) of the Act, 21 U.S.C. section 360bbb-3(b)(1), unless the authorization is terminated or revoked.     Resp Syncytial Virus by PCR NEGATIVE NEGATIVE Final    Comment: (NOTE) Fact Sheet for Patients: BloggerCourse.com  Fact Sheet for Healthcare Providers: SeriousBroker.it  This test is not yet  approved or cleared by the [place] FDA and has been authorized for detection and/or diagnosis of SARS-CoV-2 by FDA under an Emergency Use Authorization (EUA). This EUA will remain in effect (meaning this test can be used) for the duration of the COVID-19 declaration under Section 564(b)(1) of the Act, 21 U.S.C. section 360bbb-3(b)(1), unless the authorization is terminated or revoked.  Performed at [facility], [address], [place], [place] [address]          Radiology Studies: No results found.      Scheduled Meds:  apixaban  2.5 mg Oral BID   DULoxetine  60 mg Oral Daily   empagliflozin  10 mg Oral QAC breakfast   ferrous sulfate  325 mg Oral Q breakfast   furosemide  40 mg Oral BID   insulin aspart  0-5 Units Subcutaneous QHS   insulin aspart  0-9 Units Subcutaneous TID WC   insulin glargine-yfgn  12 Units Subcutaneous Daily   pantoprazole  40 mg Oral BID   sodium chloride flush  3-10 mL Intravenous Q12H  traZODone  50 mg Oral QHS   umeclidinium-vilanterol  1 puff Inhalation Daily   Continuous Infusions:   LOS: 4 days    Time spent: 56 minutes spent on chart review, discussion with nursing staff, consultants, updating family and interview/physical exam; more than 50% of that time was spent in counseling and/or coordination of care.    Alvira Philips [place], DO [facility] Available via Epic secure chat 7am-7pm After these hours, please refer to coverage provider listed on [email or web address] 04/26/2023, 3:25 PM

## 2023-04-26 NOTE — Plan of Care (Signed)
  Problem: Education: Goal: Ability to describe self-care measures that may prevent or decrease complications (Diabetes Survival Skills Education) will improve Outcome: Progressing Goal: Individualized Educational Video(s) Outcome: Progressing   Problem: Coping: Goal: Ability to adjust to condition or change in health will improve Outcome: Progressing   Problem: Fluid Volume: Goal: Ability to maintain a balanced intake and output will improve Outcome: Progressing   Problem: Health Behavior/Discharge Planning: Goal: Ability to identify and utilize available resources and services will improve Outcome: Progressing Goal: Ability to manage health-related needs will improve Outcome: Progressing   Problem: Clinical Measurements: Goal: Ability to maintain clinical measurements within normal limits will improve Outcome: Progressing Goal: Will remain free from infection Outcome: Progressing Goal: Diagnostic test results will improve Outcome: Progressing Goal: Respiratory complications will improve Outcome: Progressing Goal: Cardiovascular complication will be avoided Outcome: Progressing

## 2023-04-26 NOTE — Progress Notes (Signed)
Physical Therapy Treatment Patient Details Name: Eddie Graham MRN: [id number] DOB: Sep 19, 1934 Today's Date: 04/26/2023   History of Present Illness Pt is an 88 y.o. male admitted 12/26 with acute exacerbation of CHF. He presented to the ED via EMS with c/o SOB and CP. Multiple recent hospitalizations with similar presentation. PMH: CAD, CHF, COPD, HTN, AAA, CKD, DM, neuropathy, a fib/flutter, colostomy, depression, back pain    PT Comments  Pt received in bed with c/o back pain and a sore throat. Mod I bed mobility, supervision transfers, CGA amb in room without AD, and CGA amb 120' with RW. Maintained SpO2 in 90s on 4L. HR into 130s during activity. Pt returned to bed at end of session.    If plan is discharge home, recommend the following: A little help with walking and/or transfers;A little help with bathing/dressing/bathroom;Assist for transportation;Assistance with cooking/housework;Direct supervision/assist for medications management   Can travel by private vehicle     Yes  Equipment Recommendations  None recommended by PT    Recommendations for Other Services       Precautions / Restrictions Precautions Precautions: Fall;Other (comment) Precaution Comments: colostomy; watch SpO2 (3-4L at baseline) Restrictions Weight Bearing Restrictions Per Provider Order: No     Mobility  Bed Mobility Overal bed mobility: Modified Independent                  Transfers Overall transfer level: Needs assistance Equipment used: Ambulation equipment used Transfers: Sit to/from Stand Sit to Stand: Supervision           General transfer comment: supervision for safety, able to ambulate around room without AD    Ambulation/Gait Ambulation/Gait assistance: Contact guard assist Gait Distance (Feet): 120 Feet Assistive device: Rolling walker (2 wheels) Gait Pattern/deviations: Step-through pattern, Decreased stride length Gait velocity: decreased Gait velocity  interpretation: <1.31 ft/sec, indicative of household ambulator   General Gait Details: Amb on 4L with SpO2 in 90s. HR into 130s   Stairs             Wheelchair Mobility     Tilt Bed    Modified Rankin (Stroke Patients Only)       Balance Overall balance assessment: Needs assistance Sitting-balance support: No upper extremity supported, Feet supported Sitting balance-Leahy Scale: Good     Standing balance support: No upper extremity supported, Bilateral upper extremity supported, During functional activity Standing balance-Leahy Scale: Fair                              Cognition Arousal: Alert Behavior During Therapy: WFL for tasks assessed/performed Overall Cognitive Status: Within Functional Limits for tasks assessed                                 General Comments: A/O x4, able to follow commands, able to participate and reply as needed throughout session        Exercises      General Comments General comments (skin integrity, edema, etc.): Maintained SpO2 in 90s on 4L      Pertinent Vitals/Pain Pain Assessment Pain Assessment: Faces Faces Pain Scale: Hurts even more Pain Location: throat and back Pain Descriptors / Indicators: Discomfort, Grimacing, Sore Pain Intervention(s): Monitored during session, Patient requesting pain meds-RN notified    Home Living  Prior Function            PT Goals (current goals can now be found in the care plan section) Acute Rehab PT Goals Patient Stated Goal: home Progress towards PT goals: Progressing toward goals    Frequency    Min 1X/week      PT Plan      Co-evaluation              AM-PAC PT "6 Clicks" Mobility   Outcome Measure  Help needed turning from your back to your side while in a flat bed without using bedrails?: None Help needed moving from lying on your back to sitting on the side of a flat bed without using  bedrails?: A Little Help needed moving to and from a bed to a chair (including a wheelchair)?: A Little Help needed standing up from a chair using your arms (e.g., wheelchair or bedside chair)?: A Little Help needed to walk in hospital room?: A Little Help needed climbing 3-5 steps with a railing? : A Little 6 Click Score: 19    End of Session Equipment Utilized During Treatment: Oxygen;Gait belt Activity Tolerance: Patient tolerated treatment well Patient left: in bed;with call bell/phone within reach Nurse Communication: Mobility status PT Visit Diagnosis: Other abnormalities of gait and mobility (R26.89)     Time: 4098-1191 PT Time Calculation (min) (ACUTE ONLY): 15 min  Charges:    $Gait Training: 8-22 mins PT General Charges $$ ACUTE PT VISIT: 1 Visit                     Ferd Glassing., PT  Office # [phone]    Ilda Foil 04/26/2023, 11:03 AM

## 2023-04-26 NOTE — Inpatient Diabetes Management (Signed)
Inpatient Diabetes Program Recommendations  AACE/ADA: New Consensus Statement on Inpatient Glycemic Control (2015)  Target Ranges:  Prepandial:   less than 140 mg/dL      Peak postprandial:   less than 180 mg/dL (1-2 hours)      Critically ill patients:  140 - 180 mg/dL   Lab Results  Component Value Date   GLUCAP 206 (H) 04/26/2023   HGBA1C 7.4 (H) 01/20/2023    Review of Glycemic Control  Latest Reference Range & Units 04/25/23 05:39 04/25/23 11:50 04/25/23 17:15 04/25/23 20:55 04/26/23 06:03  Glucose-Capillary 70 - 99 mg/dL 161 (H) 096 (H) 045 (H) 233 (H) 206 (H)  (H): Data is abnormally high  Diabetes history: DM2 Outpatient Diabetes medications: Jardiance 10 mg every day, Rybelsus 14 mg every day, Lantus 24 units QD Current orders for Inpatient glycemic control: Jardiance 10 mg every day, Novolog 0-9 units TID and 0-5 units QHS  Inpatient Diabetes Program Recommendations:    Semglee 12 units (50% of home dose).  Will continue to follow while inpatient.  Thank you, Dulce Sellar, MSN, CDCES Diabetes Coordinator Inpatient Diabetes Program [phone] (team pager from 8a-5p)

## 2023-04-27 DIAGNOSIS — I5023 Acute on chronic systolic (congestive) heart failure: Secondary | ICD-10-CM | POA: Diagnosis not present

## 2023-04-27 LAB — BASIC METABOLIC PANEL
Anion gap: 9 (ref 5–15)
BUN: 35 mg/dL — ABNORMAL HIGH (ref 8–23)
CO2: 27 mmol/L (ref 22–32)
Calcium: 8.6 mg/dL — ABNORMAL LOW (ref 8.9–10.3)
Chloride: 100 mmol/L (ref 98–111)
Creatinine, Ser: 1.59 mg/dL — ABNORMAL HIGH (ref 0.61–1.24)
GFR, Estimated: 42 mL/min — ABNORMAL LOW (ref >60)
Glucose, Bld: 187 mg/dL — ABNORMAL HIGH (ref 70–99)
Potassium: 3.7 mmol/L (ref 3.5–5.1)
Sodium: 136 mmol/L (ref 135–145)

## 2023-04-27 LAB — GLUCOSE, CAPILLARY
Glucose-Capillary: 175 mg/dL — ABNORMAL HIGH (ref 70–99)
Glucose-Capillary: 206 mg/dL — ABNORMAL HIGH (ref 70–99)
Glucose-Capillary: 266 mg/dL — ABNORMAL HIGH (ref 70–99)
Glucose-Capillary: 252 mg/dL — ABNORMAL HIGH (ref 70–99)

## 2023-04-27 LAB — MAGNESIUM: Magnesium: 2.5 mg/dL — ABNORMAL HIGH (ref 1.7–2.4)

## 2023-04-27 MED ORDER — POTASSIUM CHLORIDE CRYS ER 20 MEQ PO TBCR
30.0000 meq | EXTENDED_RELEASE_TABLET | Freq: Once | ORAL | Status: AC
  Administered 2023-04-27: 30 meq via ORAL
  Filled 2023-04-27: qty 1

## 2023-04-27 MED ORDER — INSULIN GLARGINE-YFGN 100 UNIT/ML ~~LOC~~ SOLN
20.0000 [IU] | Freq: Every day | SUBCUTANEOUS | Status: DC
Start: 2023-04-28 — End: 2023-04-28
  Administered 2023-04-28: 20 [IU] via SUBCUTANEOUS
  Filled 2023-04-27: qty 0.2

## 2023-04-27 MED ORDER — METOPROLOL SUCCINATE ER 25 MG PO TB24
25.0000 mg | ORAL_TABLET | Freq: Every day | ORAL | Status: DC
  Administered 2023-04-27 – 2023-05-03 (×7): 25 mg via ORAL
  Filled 2023-04-27 (×7): qty 1

## 2023-04-27 NOTE — Progress Notes (Signed)
 Dr. DANNIAL NOTE    Eddie Graham  FMW:[id number] DOB: Jul 25, 1934 DOA: 04/22/2023 PCP: Delilah Murray HERO., MD    Brief Narrative:   Eddie Graham is a 88 y.o. male with past medical history significant for chronic systolic congestive heart failure (LVEF 25-30%), CAD s/p CABG 2011, paroxysmal atrial fibrillation/flutter on Eliquis , HTN, HLD, COPD, chronic hypoxic respiratory failure on 4 L nasal cannula at baseline, CKD stage IIIb, anemia of chronic medical/renal disease, abdominal aortic aneurysm, high-grade bladder cancer s/p TURP who presented to [facility] ED on 12/26 from home via EMS with complaints of chest pain and shortness of breath.  Patient also complaining of orthopnea, pleuritic chest pain.  On EMS arrival, patient was found off of his oxygen with SpO2 in the 70s.  In the ED, patient was noted to be profoundly dyspneic.  Temperature 97.8 F, HR 69, RR 18, BP 152/73, SpO2 100% on 3 L nasal cannula.  WBC 7.7, hemoglobin 14.7, platelet count 197.  Sodium 139, potassium 3.4, chloride 104, CO2 26, glucose 139, BUN 16, creatinine 1.21.  AST 17, ALT 19, total bilirubin 1.2.  BNP 841.2.  High sensitive troponin 23 followed by 19.  D-dimer 0.80.  COVID/influenza/RSV PCR negative.  Chest x-ray with mild bibasilar subsegmental atelectasis with small bilateral pleural effusions.  Patient was given a breathing treatment by EDP.  TRH consulted for admission for further evaluation management of decompensated CHF in setting of medication noncompliance/nonadherence.  Patient now with 4 admissions in the past 6 weeks, last discharged on 04/17/2023 to home.  Suspect medical noncompliance.   Assessment & Plan:   Acute respiratory failure without hypoxia Acute on chronic systolic congestive heart failure exacerbation Essential hypertension Patient presenting to ED with progressive dyspnea, orthopnea, lower extremity edema in the setting of medication noncompliance, nonadherence to his  chronic home oxygen.  Was found by EMS with SpO2 in the 70s.  Workup in the ED, patient was afebrile without leukocytosis, BNP elevated with concerns of vascular congestion on chest x-ray.  COVID/influenza/RSV PCR negative.  TTE 04/12/2023 with LVEF 25-30%, LV with global hypokinesis, biatrial enlargement, IVC dilated. -- net negative 2.4L past 24h and net negative 11.5L since admission -- Metoprolol  succinate 12.5 g p.o. daily (reduced from home dose 100mg /d) -- Furosemide  40 mg p.o. twice daily -- Strict I's and O's Daily weights  Hypokalemia Potassium 3.7 today, will replete -- Repeat electrolytes in a.m. to include magnesium  Chronic hypoxic respiratory failure on home O2 COPD, not in exacerbation -- Anoro Ellipta  1 puff daily -- Singulair  10 mg p.o. daily -- Albuterol  neb every 4 hours.  Wheezing/shortness of breath -- Continue supplemental oxygen, maintain SpO2 greater than 88%, on 4 L nasal cannula baseline  Paroxysmal atrial fibrillation -- Metoprolol  succinate 12.5 mg p.o. daily -- Eliquis  2.5 g p.o. twice daily  Hyperlipidemia -- Crestor  40 mg p.o. daily  Type 2 diabetes mellitus, with hyperglycemia Hemoglobin A1c 7.4 01/20/2023.  Home regimen includes Lantus  24 units [place] daily and Jardiance  -- Semglee  20u [place] daily -- Sensitive SSI for coverage -- Jardiance  10 mg p.o. daily -- CBG before every meal/at bedtime  AKI on CKD stage IIIb Creatinine 1.21>1.84>1.90>2.02>1.99>1.59 -- BMP daily  CAD s/p CABG -- Crestor  40 mg p.o. nightly  Anemia of chronic medical/renal disease Iron deficiency anemia Hemoglobin 14.5, stable. -- Ferrous sulfate  325 mg p.o. daily  Abdominal aortic aneurysm Continue surveillance  Hx bladder cancer s/p TURP Outpatient follow-up with urology  GERD --Protonix  40 mg p.o. daily  Weakness/deconditioning/debility: -- PT/OT  recommend SNF placement, continue therapy efforts while inpatient -- TOC consulted for placement   DVT prophylaxis:  apixaban  (ELIQUIS ) tablet 2.5 mg Start: 04/25/23 2200 apixaban  (ELIQUIS ) tablet 2.5 mg    Code Status: Full Code Family Communication: No family present at bedside this morning  Disposition Plan:  Level of care: Telemetry Medical Status is: Inpatient Remains inpatient appropriate because: Pending SNF placement, medically stable for discharge once bed available    Consultants:  None  Procedures:  none  Antimicrobials:  none   Subjective: Patient seen examined bedside, resting calmly.  Sitting at edge of bed.  Just finished eating breakfast.  No complaints this morning.  Remains on 4 L oxygen, with SpO2 97% which is his baseline.  Awaiting placement.  No other specific complaints, concerns or questions at this time.  Denies headache, no dizziness, no chest pain, no palpitations, no shortness of breath, no abdominal pain, no fever/chills/night sweats, no nausea/vomiting/diarrhea, no focal weakness, no fatigue, no paresthesia.  No acute events overnight per nursing staff.  Objective: Vitals:   04/27/23 0313 04/27/23 0815 04/27/23 0816 04/27/23 1127  BP: 125/70 130/69  123/72  Pulse: 75 79  69  Resp: 16 13  20   Temp: (!) 97.3 F (36.3 C) 97.7 F (36.5 C) 97.7 F (36.5 C) 97.7 F (36.5 C)  TempSrc: Oral Oral Oral Oral  SpO2: 96% 94% 94% 94%  Weight: 91.7 kg     Height:        Intake/Output Summary (Last 24 hours) at 04/27/2023 1444 Last data filed at 04/27/2023 1431 Gross per 24 hour  Intake 880 ml  Output 2300 ml  Net -1420 ml   Filed Weights   04/25/23 0542 04/26/23 0605 04/27/23 0313  Weight: 91.4 kg 91.8 kg 91.7 kg    Examination:  Physical Exam: GEN: NAD, alert and oriented x 3, chronically ill appearance, appears older than stated age HEENT: NCAT, PERRL, EOMI, sclera clear, MMM PULM: Breath sounds slight diminished bilateral bases, noted that late expiratory wheezing bilateral upper lung fields, no crackles, normal respiratory effort without accessory muscle  use, on 4 L nasal cannula which is at baseline CV: Tachycardic, regular rhythm w/o M/G/R GI: abd soft, NTND, NABS, no R/G/M MSK: Trace bilateral lower extremity peripheral edema, muscle strength globally intact 5/5 bilateral upper/lower extremities NEURO: CN II-XII intact, no focal deficits, sensation to light touch intact PSYCH: normal mood/affect Integumentary: dry/intact, no rashes or wounds    Data Reviewed: I have personally reviewed following labs and imaging studies  CBC: Recent Labs  Lab 04/22/23 1445 04/23/23 0349 04/24/23 0313 04/25/23 0240 04/26/23 0305  WBC 7.7 8.7 10.0 9.3 8.2  NEUTROABS 5.6  --   --   --   --   HGB 14.7 14.8 14.9 14.6 14.5  HCT 47.3 47.6 46.0 45.4 44.9  MCV 89.8 88.8 87.8 87.0 87.2  PLT 197 196 208 209 192   Basic Metabolic Panel: Recent Labs  Lab 04/23/23 0349 04/24/23 0313 04/25/23 0240 04/26/23 0305 04/27/23 0356  NA 140 137 135 138 136  K 3.5 3.1* 3.9 3.4* 3.7  CL 101 96* 95* 100 100  CO2 29 29 31 28 27   GLUCOSE 144* 215* 285* 210* 187*  BUN 24* 30* 37* 38* 35*  CREATININE 1.84* 1.90* 2.02* 1.99* 1.59*  CALCIUM  8.5* 8.6* 8.6* 8.5* 8.6*  MG  --   --   --   --  2.5*   GFR: Estimated Creatinine Clearance: 35.4 mL/min (A) (by C-G formula based  on SCr of 1.59 mg/dL (H)). Liver Function Tests: Recent Labs  Lab 04/22/23 1445  AST 17  ALT 19  ALKPHOS 68  BILITOT 1.2*  PROT 6.4*  ALBUMIN 3.0*   No results for input(s): LIPASE, AMYLASE in the last 168 hours. No results for input(s): AMMONIA in the last 168 hours. Coagulation Profile: No results for input(s): INR, PROTIME in the last 168 hours. Cardiac Enzymes: No results for input(s): CKTOTAL, CKMB, CKMBINDEX, TROPONINI in the last 168 hours. BNP (last 3 results) No results for input(s): PROBNP in the last 8760 hours. HbA1C: No results for input(s): HGBA1C in the last 72 hours. CBG: Recent Labs  Lab 04/26/23 1109 04/26/23 1620 04/26/23 2108  04/27/23 0547 04/27/23 1123  GLUCAP 265* 258* 197* 175* 206*   Lipid Profile: No results for input(s): CHOL, HDL, LDLCALC, TRIG, CHOLHDL, LDLDIRECT in the last 72 hours. Thyroid Function Tests: No results for input(s): TSH, T4TOTAL, FREET4, T3FREE, THYROIDAB in the last 72 hours. Anemia Panel: No results for input(s): VITAMINB12, FOLATE, FERRITIN, TIBC, IRON, RETICCTPCT in the last 72 hours. Sepsis Labs: No results for input(s): PROCALCITON, LATICACIDVEN in the last 168 hours.  Recent Results (from the past 240 hours)  Resp panel by RT-PCR (RSV, Flu A&B, Covid) Anterior Nasal Swab     Status: None   Collection Time: 04/22/23  2:45 PM   Specimen: Anterior Nasal Swab  Result Value Ref Range Status   SARS Coronavirus 2 by RT PCR NEGATIVE NEGATIVE Final   Influenza A by PCR NEGATIVE NEGATIVE Final   Influenza B by PCR NEGATIVE NEGATIVE Final    Comment: (NOTE) The Xpert Xpress SARS-CoV-2/FLU/RSV plus assay is intended as an aid in the diagnosis of influenza from Nasopharyngeal swab specimens and should not be used as a sole basis for treatment. Nasal washings and aspirates are unacceptable for Xpert Xpress SARS-CoV-2/FLU/RSV testing.  Fact Sheet for Patients: bloggercourse.com  Fact Sheet for Healthcare Providers: seriousbroker.it  This test is not yet approved or cleared by the [place]  FDA and has been authorized for detection and/or diagnosis of SARS-CoV-2 by FDA under an Emergency Use Authorization (EUA). This EUA will remain in effect (meaning this test can be used) for the duration of the COVID-19 declaration under Section 564(b)(1) of the Act, 21 U.S.C. section 360bbb-3(b)(1), unless the authorization is terminated or revoked.     Resp Syncytial Virus by PCR NEGATIVE NEGATIVE Final    Comment: (NOTE) Fact Sheet for Patients: bloggercourse.com  Fact  Sheet for Healthcare Providers: seriousbroker.it  This test is not yet approved or cleared by the [place]  FDA and has been authorized for detection and/or diagnosis of SARS-CoV-2 by FDA under an Emergency Use Authorization (EUA). This EUA will remain in effect (meaning this test can be used) for the duration of the COVID-19 declaration under Section 564(b)(1) of the Act, 21 U.S.C. section 360bbb-3(b)(1), unless the authorization is terminated or revoked.  Performed at [facility], [address], [place], [place] [address]          Radiology Studies: No results found.      Scheduled Meds:  apixaban   2.5 mg Oral BID   DULoxetine   60 mg Oral Daily   empagliflozin   10 mg Oral QAC breakfast   ferrous sulfate   325 mg Oral Q breakfast   furosemide   40 mg Oral BID   insulin  aspart  0-5 Units Subcutaneous QHS   insulin  aspart  0-9 Units Subcutaneous TID WC   insulin  glargine-yfgn  12 Units Subcutaneous Daily   metoprolol  succinate  25 mg Oral Daily   montelukast   10 mg Oral QHS   pantoprazole   40 mg Oral BID   sodium chloride  flush  3-10 mL Intravenous Q12H   traZODone   50 mg Oral QHS   umeclidinium-vilanterol  1 puff Inhalation Daily   Continuous Infusions:   LOS: 5 days    Time spent: 56 minutes spent on chart review, discussion with nursing staff, consultants, updating family and interview/physical exam; more than 50% of that time was spent in counseling and/or coordination of care.    Camellia PARAS Briunna Leicht, DO [facility] Available via Epic secure chat 7am-7pm After these hours, please refer to coverage provider listed on [email or web address] 04/27/2023, 2:44 PM

## 2023-04-27 NOTE — TOC Progression Note (Addendum)
 Transition of Care [facility]) - Progression Note    Patient Details  Name: Eddie Graham MRN: [id number] Date of Birth: [birth year]  [facility]) CM/SW Contact  Montie LOISE Louder, [place] Phone Number: 04/27/2023, 3:56 PM  Clinical Narrative:     Spoke with patient's daughter, Davied by phone- CSW informed family of bed offers. Family preferred SNF is [facility]-   CSW sent message to [facility], confirmed availability- patient will updated PT note before authorization for SNF can be started.   Montie Louder, MSW, LCSW Clinical Social Worker     Expected Discharge Plan: Skilled Nursing Facility Barriers to Discharge: English As A Second Language Teacher, Continued Medical Work up  Expected Discharge Plan and Services                                               Social Determinants of Health (SDOH) Interventions SDOH Screenings   Food Insecurity: Food Insecurity Present (04/23/2023)  Housing: Low Risk  (04/23/2023)  Recent Concern: Housing - High Risk (03/01/2023)  Transportation Needs: Unmet Transportation Needs (04/23/2023)  Utilities: Not At Risk (04/23/2023)  Financial Resource Strain: Low Risk  (08/29/2021)  Social Connections: Unknown (04/27/2023)  Tobacco Use: Low Risk  (04/22/2023)    Readmission Risk Interventions     No data to display

## 2023-04-27 NOTE — Plan of Care (Signed)
  Problem: Coping: Goal: Ability to adjust to condition or change in health will improve Outcome: Progressing   Problem: Fluid Volume: Goal: Ability to maintain a balanced intake and output will improve Outcome: Progressing   Problem: Nutritional: Goal: Maintenance of adequate nutrition will improve Outcome: Progressing   Problem: Education: Goal: Knowledge of General Education information will improve Description: Including pain rating scale, medication(s)/side effects and non-pharmacologic comfort measures Outcome: Progressing   Problem: Clinical Measurements: Goal: Will remain free from infection Outcome: Progressing Goal: Respiratory complications will improve Outcome: Progressing Goal: Cardiovascular complication will be avoided Outcome: Progressing   Problem: Nutrition: Goal: Adequate nutrition will be maintained Outcome: Progressing   Problem: Pain Management: Goal: General experience of comfort will improve Outcome: Progressing   Problem: Health Behavior/Discharge Planning: Goal: Ability to identify and utilize available resources and services will improve Outcome: Not Progressing Goal: Ability to manage health-related needs will improve Outcome: Not Progressing   Problem: Metabolic: Goal: Ability to maintain appropriate glucose levels will improve Outcome: Not Progressing   Problem: Nutritional: Goal: Progress toward achieving an optimal weight will improve Outcome: Not Progressing   Problem: Skin Integrity: Goal: Risk for impaired skin integrity will decrease Outcome: Not Progressing   Problem: Coping: Goal: Level of anxiety will decrease Outcome: Not Progressing

## 2023-04-27 NOTE — Progress Notes (Signed)
 Mobility Specialist Progress Note:   04/27/23 1049  Mobility  Activity Ambulated with assistance in hallway  Level of Assistance Standby assist, set-up cues, supervision of patient - no hands on  Assistive Device Front wheel walker  Distance Ambulated (ft) 470 ft  Activity Response Tolerated well  Mobility Referral Yes  Mobility visit 1 Mobility  Mobility Specialist Start Time (ACUTE ONLY) 0930  Mobility Specialist Stop Time (ACUTE ONLY) 0950  Mobility Specialist Time Calculation (min) (ACUTE ONLY) 20 min   Pt received in bed, agreeable to mobility. C/o back pain upon standing, otherwise asx throughout. VSS on 2 L. Pt returned to bed with call bell in reach and all needs met.  [name]  Mobility Specialist Please contact via [employer] office at [phone]

## 2023-04-27 NOTE — TOC Progression Note (Signed)
 Transition of Care [facility]) - Progression Note    Patient Details  Name: Eddie Graham MRN: [id number] Date of Birth: 08-20-1934  Transition of Care [facility]) CM/SW Contact  Montie LOISE Louder, [place] Phone Number: 04/27/2023, 3:16 PM  Clinical Narrative:     Pasrr # received [phone] [id number]       Expected Discharge Plan and Services                                               Social Determinants of Health (SDOH) Interventions SDOH Screenings   Food Insecurity: Food Insecurity Present (04/23/2023)  Housing: Low Risk  (04/23/2023)  Recent Concern: Housing - High Risk (03/01/2023)  Transportation Needs: Unmet Transportation Needs (04/23/2023)  Utilities: Not At Risk (04/23/2023)  Financial Resource Strain: Low Risk  (08/29/2021)  Social Connections: Unknown (04/27/2023)  Tobacco Use: Low Risk  (04/22/2023)    Readmission Risk Interventions     No data to display

## 2023-04-28 DIAGNOSIS — I5023 Acute on chronic systolic (congestive) heart failure: Secondary | ICD-10-CM | POA: Diagnosis not present

## 2023-04-28 LAB — BASIC METABOLIC PANEL
Anion gap: 10 (ref 5–15)
BUN: 32 mg/dL — ABNORMAL HIGH (ref 8–23)
CO2: 28 mmol/L (ref 22–32)
Calcium: 8.7 mg/dL — ABNORMAL LOW (ref 8.9–10.3)
Chloride: 99 mmol/L (ref 98–111)
Creatinine, Ser: 1.57 mg/dL — ABNORMAL HIGH (ref 0.61–1.24)
GFR, Estimated: 43 mL/min — ABNORMAL LOW (ref >60)
Glucose, Bld: 158 mg/dL — ABNORMAL HIGH (ref 70–99)
Potassium: 3.2 mmol/L — ABNORMAL LOW (ref 3.5–5.1)
Sodium: 137 mmol/L (ref 135–145)

## 2023-04-28 LAB — GLUCOSE, CAPILLARY
Glucose-Capillary: 174 mg/dL — ABNORMAL HIGH (ref 70–99)
Glucose-Capillary: 216 mg/dL — ABNORMAL HIGH (ref 70–99)
Glucose-Capillary: 162 mg/dL — ABNORMAL HIGH (ref 70–99)
Glucose-Capillary: 214 mg/dL — ABNORMAL HIGH (ref 70–99)

## 2023-04-28 MED ORDER — INSULIN GLARGINE-YFGN 100 UNIT/ML ~~LOC~~ SOLN
25.0000 [IU] | Freq: Every day | SUBCUTANEOUS | Status: DC
  Administered 2023-04-29 – 2023-04-30 (×2): 25 [IU] via SUBCUTANEOUS
  Filled 2023-04-28 (×3): qty 0.25

## 2023-04-28 MED ORDER — FUROSEMIDE 40 MG PO TABS
60.0000 mg | ORAL_TABLET | Freq: Every day | ORAL | Status: DC
  Administered 2023-04-28: 60 mg via ORAL
  Filled 2023-04-28: qty 1

## 2023-04-28 MED ORDER — POTASSIUM CHLORIDE CRYS ER 20 MEQ PO TBCR
40.0000 meq | EXTENDED_RELEASE_TABLET | ORAL | Status: AC
  Administered 2023-04-28 (×2): 40 meq via ORAL
  Filled 2023-04-28 (×2): qty 2

## 2023-04-28 NOTE — Progress Notes (Signed)
 Dr. DANNIAL NOTE    Kazim Sanchez-Feliciano  FMW:[id number] DOB: 1935-04-10 DOA: 04/22/2023 PCP: Delilah Murray HERO., MD    Brief Narrative:   Gabriella Guile is a 89 y.o. male with past medical history significant for chronic systolic congestive heart failure (LVEF 25-30%), CAD s/p CABG 2011, paroxysmal atrial fibrillation/flutter on Eliquis , HTN, HLD, COPD, chronic hypoxic respiratory failure on 4 L nasal cannula at baseline, CKD stage IIIb, anemia of chronic medical/renal disease, abdominal aortic aneurysm, high-grade bladder cancer s/p TURP who presented to [facility] ED on 12/26 from home via EMS with complaints of chest pain and shortness of breath.  Patient also complaining of orthopnea, pleuritic chest pain.  On EMS arrival, patient was found off of his oxygen with SpO2 in the 70s.  In the ED, patient was noted to be profoundly dyspneic.  Temperature 97.8 F, HR 69, RR 18, BP 152/73, SpO2 100% on 3 L nasal cannula.  WBC 7.7, hemoglobin 14.7, platelet count 197.  Sodium 139, potassium 3.4, chloride 104, CO2 26, glucose 139, BUN 16, creatinine 1.21.  AST 17, ALT 19, total bilirubin 1.2.  BNP 841.2.  High sensitive troponin 23 followed by 19.  D-dimer 0.80.  COVID/influenza/RSV PCR negative.  Chest x-ray with mild bibasilar subsegmental atelectasis with small bilateral pleural effusions.  Patient was given a breathing treatment by EDP.  TRH consulted for admission for further evaluation management of decompensated CHF in setting of medication noncompliance/nonadherence.  Patient now with 4 admissions in the past 6 weeks, last discharged on 04/17/2023 to home.  Suspect medical noncompliance.   Assessment & Plan:   Acute respiratory failure without hypoxia Acute on chronic systolic congestive heart failure exacerbation Essential hypertension Patient presenting to ED with progressive dyspnea, orthopnea, lower extremity edema in the setting of medication noncompliance, nonadherence to his  chronic home oxygen.  Was found by EMS with SpO2 in the 70s.  Workup in the ED, patient was afebrile without leukocytosis, BNP elevated with concerns of vascular congestion on chest x-ray.  COVID/influenza/RSV PCR negative.  TTE 04/12/2023 with LVEF 25-30%, LV with global hypokinesis, biatrial enlargement, IVC dilated. -- net negative 1.5L past 24h and net negative 13L since admission -- Metoprolol  succinate 12.5 g p.o. daily (reduced from home dose 100mg /d) -- Furosemide  60 mg p.o. daily -- Strict I's and O's Daily weights  Hypokalemia Potassium 3.2 today, will replete -- Repeat electrolytes in a.m. to include magnesium  Chronic hypoxic respiratory failure on home O2 COPD, not in exacerbation -- Anoro Ellipta  1 puff daily -- Singulair  10 mg p.o. daily -- Albuterol  neb every 4 hours.  Wheezing/shortness of breath -- Continue supplemental oxygen, maintain SpO2 greater than 88%, on 4 L nasal cannula baseline  Paroxysmal atrial fibrillation -- Metoprolol  succinate 12.5 mg p.o. daily -- Eliquis  2.5 g p.o. twice daily  Hyperlipidemia -- Crestor  40 mg p.o. daily  Type 2 diabetes mellitus, with hyperglycemia Hemoglobin A1c 7.4 01/20/2023.  Home regimen includes Lantus  24 units [place] daily and Jardiance  -- Semglee  25u [place] daily -- Sensitive SSI for coverage -- Jardiance  10 mg p.o. daily -- CBG before every meal/at bedtime  AKI on CKD stage IIIb Creatinine 1.21>1.84>1.90>2.02>1.99>1.59>1.57 -- BMP daily  CAD s/p CABG -- Crestor  40 mg p.o. nightly  Anemia of chronic medical/renal disease Iron deficiency anemia Hemoglobin 14.5, stable. -- Ferrous sulfate  325 mg p.o. daily  Abdominal aortic aneurysm Continue surveillance  Hx bladder cancer s/p TURP Outpatient follow-up with urology  GERD --Protonix  40 mg p.o. daily  Weakness/deconditioning/debility: -- PT/OT recommend  SNF placement, continue therapy efforts while inpatient -- TOC consulted for placement   DVT prophylaxis:  apixaban  (ELIQUIS ) tablet 2.5 mg Start: 04/25/23 2200 apixaban  (ELIQUIS ) tablet 2.5 mg    Code Status: Full Code Family Communication: No family present at bedside this morning  Disposition Plan:  Level of care: Telemetry Medical Status is: Inpatient Remains inpatient appropriate because: Pending SNF placement, medically stable for discharge once bed available    Consultants:  None  Procedures:  none  Antimicrobials:  none   Subjective: Patient seen examined bedside, resting calmly.  Sitting at edge of bed.  No complaints this morning.  Remains on 4 L oxygen, with SpO2 97% which is his baseline.  Awaiting placement.  No other specific complaints, concerns or questions at this time.  Denies headache, no dizziness, no chest pain, no palpitations, no shortness of breath, no abdominal pain, no fever/chills/night sweats, no nausea/vomiting/diarrhea, no focal weakness, no fatigue, no paresthesia.  No acute events overnight per nursing staff.  Objective: Vitals:   04/28/23 0326 04/28/23 0755 04/28/23 0849 04/28/23 1204  BP:  126/66  (!) 87/62  Pulse: (!) 106 88  76  Resp: 15 18  19   Temp:  98.4 F (36.9 C)  97.8 F (36.6 C)  TempSrc:  Oral  Oral  SpO2:   96% 93%  Weight:      Height:        Intake/Output Summary (Last 24 hours) at 04/28/2023 1313 Last data filed at 04/28/2023 1207 Gross per 24 hour  Intake 480 ml  Output 3000 ml  Net -2520 ml   Filed Weights   04/26/23 0605 04/27/23 0313 04/28/23 0300  Weight: 91.8 kg 91.7 kg 92.4 kg    Examination:  Physical Exam: GEN: NAD, alert and oriented x 3, chronically ill appearance, appears older than stated age HEENT: NCAT, PERRL, EOMI, sclera clear, MMM PULM: CTAB w/o w/r/r, noted that late expiratory wheezing bilateral upper lung fields, no crackles, normal respiratory effort without accessory muscle use, on 4 L nasal cannula which is at baseline CV: Tachycardic, regular rhythm w/o M/G/R GI: abd soft, NTND, NABS, no  R/G/M MSK: Trace bilateral lower extremity peripheral edema, muscle strength globally intact 5/5 bilateral upper/lower extremities NEURO: CN II-XII intact, no focal deficits, sensation to light touch intact PSYCH: normal mood/affect Integumentary: dry/intact, no rashes or wounds    Data Reviewed: I have personally reviewed following labs and imaging studies  CBC: Recent Labs  Lab 04/22/23 1445 04/23/23 0349 04/24/23 0313 04/25/23 0240 04/26/23 0305  WBC 7.7 8.7 10.0 9.3 8.2  NEUTROABS 5.6  --   --   --   --   HGB 14.7 14.8 14.9 14.6 14.5  HCT 47.3 47.6 46.0 45.4 44.9  MCV 89.8 88.8 87.8 87.0 87.2  PLT 197 196 208 209 192   Basic Metabolic Panel: Recent Labs  Lab 04/24/23 0313 04/25/23 0240 04/26/23 0305 04/27/23 0356 04/28/23 0353  NA 137 135 138 136 137  K 3.1* 3.9 3.4* 3.7 3.2*  CL 96* 95* 100 100 99  CO2 29 31 28 27 28   GLUCOSE 215* 285* 210* 187* 158*  BUN 30* 37* 38* 35* 32*  CREATININE 1.90* 2.02* 1.99* 1.59* 1.57*  CALCIUM  8.6* 8.6* 8.5* 8.6* 8.7*  MG  --   --   --  2.5*  --    GFR: Estimated Creatinine Clearance: 35.9 mL/min (A) (by C-G formula based on SCr of 1.57 mg/dL (H)). Liver Function Tests: Recent Labs  Lab 04/22/23 1445  AST 17  ALT 19  ALKPHOS 68  BILITOT 1.2*  PROT 6.4*  ALBUMIN 3.0*   No results for input(s): LIPASE, AMYLASE in the last 168 hours. No results for input(s): AMMONIA in the last 168 hours. Coagulation Profile: No results for input(s): INR, PROTIME in the last 168 hours. Cardiac Enzymes: No results for input(s): CKTOTAL, CKMB, CKMBINDEX, TROPONINI in the last 168 hours. BNP (last 3 results) No results for input(s): PROBNP in the last 8760 hours. HbA1C: No results for input(s): HGBA1C in the last 72 hours. CBG: Recent Labs  Lab 04/27/23 1123 04/27/23 1612 04/27/23 2059 04/28/23 0602 04/28/23 1214  GLUCAP 206* 266* 252* 174* 216*   Lipid Profile: No results for input(s): CHOL, HDL,  LDLCALC, TRIG, CHOLHDL, LDLDIRECT in the last 72 hours. Thyroid Function Tests: No results for input(s): TSH, T4TOTAL, FREET4, T3FREE, THYROIDAB in the last 72 hours. Anemia Panel: No results for input(s): VITAMINB12, FOLATE, FERRITIN, TIBC, IRON, RETICCTPCT in the last 72 hours. Sepsis Labs: No results for input(s): PROCALCITON, LATICACIDVEN in the last 168 hours.  Recent Results (from the past 240 hours)  Resp panel by RT-PCR (RSV, Flu A&B, Covid) Anterior Nasal Swab     Status: None   Collection Time: 04/22/23  2:45 PM   Specimen: Anterior Nasal Swab  Result Value Ref Range Status   SARS Coronavirus 2 by RT PCR NEGATIVE NEGATIVE Final   Influenza A by PCR NEGATIVE NEGATIVE Final   Influenza B by PCR NEGATIVE NEGATIVE Final    Comment: (NOTE) The Xpert Xpress SARS-CoV-2/FLU/RSV plus assay is intended as an aid in the diagnosis of influenza from Nasopharyngeal swab specimens and should not be used as a sole basis for treatment. Nasal washings and aspirates are unacceptable for Xpert Xpress SARS-CoV-2/FLU/RSV testing.  Fact Sheet for Patients: bloggercourse.com  Fact Sheet for Healthcare Providers: seriousbroker.it  This test is not yet approved or cleared by the [place]  FDA and has been authorized for detection and/or diagnosis of SARS-CoV-2 by FDA under an Emergency Use Authorization (EUA). This EUA will remain in effect (meaning this test can be used) for the duration of the COVID-19 declaration under Section 564(b)(1) of the Act, 21 U.S.C. section 360bbb-3(b)(1), unless the authorization is terminated or revoked.     Resp Syncytial Virus by PCR NEGATIVE NEGATIVE Final    Comment: (NOTE) Fact Sheet for Patients: bloggercourse.com  Fact Sheet for Healthcare Providers: seriousbroker.it  This test is not yet approved or cleared by the  [place]  FDA and has been authorized for detection and/or diagnosis of SARS-CoV-2 by FDA under an Emergency Use Authorization (EUA). This EUA will remain in effect (meaning this test can be used) for the duration of the COVID-19 declaration under Section 564(b)(1) of the Act, 21 U.S.C. section 360bbb-3(b)(1), unless the authorization is terminated or revoked.  Performed at [facility], [address] [place], [place] [address]          Radiology Studies: No results found.      Scheduled Meds:  apixaban   2.5 mg Oral BID   DULoxetine   60 mg Oral Daily   empagliflozin   10 mg Oral QAC breakfast   ferrous sulfate   325 mg Oral Q breakfast   furosemide   60 mg Oral Daily   insulin  aspart  0-5 Units Subcutaneous QHS   insulin  aspart  0-9 Units Subcutaneous TID WC   insulin  glargine-yfgn  20 Units Subcutaneous Daily   metoprolol  succinate  25 mg Oral Daily   montelukast   10 mg Oral QHS   pantoprazole   40 mg Oral BID   sodium chloride  flush  3-10 mL Intravenous Q12H   traZODone   50 mg Oral QHS   umeclidinium-vilanterol  1 puff Inhalation Daily   Continuous Infusions:   LOS: 6 days    Time spent: 56 minutes spent on chart review, discussion with nursing staff, consultants, updating family and interview/physical exam; more than 50% of that time was spent in counseling and/or coordination of care.    Camellia PARAS Angel Hobdy, DO [facility] Available via Epic secure chat 7am-7pm After these hours, please refer to coverage provider listed on [email or web address] 04/28/2023, 1:13 PM

## 2023-04-28 NOTE — Progress Notes (Signed)
 Occupational Therapy Treatment Patient Details Name: Eddie Graham MRN: [id number] DOB: 16-May-1934 Today's Date: 04/28/2023   History of present illness Pt is an 89 y.o. male admitted 12/26 with acute exacerbation of CHF. He presented to the ED via EMS with c/o SOB and CP. Multiple recent hospitalizations with similar presentation. PMH: CAD, CHF, COPD, HTN, AAA, CKD, DM, neuropathy, a fib/flutter, colostomy, depression, back pain   OT comments  Patient seated on EOB upon entry. Patient demonstrating gains with grooming standing at sink and UB/LB dressing. Patient able to perform mobility in hallway with CGA for safety. Patient will benefit from continued inpatient follow up therapy, <3 hours/day to increase independence and safety with self care and mobility. Acute OT to continue to follow for self care, activity tolerance, and transfers.       If plan is discharge home, recommend the following:  A little help with walking and/or transfers;A little help with bathing/dressing/bathroom;Assist for transportation   Equipment Recommendations  Other (comment) (defer)    Recommendations for Other Services      Precautions / Restrictions Precautions Precautions: Fall;Other (comment) Precaution Comments: colostomy; watch SpO2 Restrictions Weight Bearing Restrictions Per Provider Order: No       Mobility Bed Mobility Overal bed mobility: Modified Independent             General bed mobility comments: seated on EOB upon entry and at end of session    Transfers Overall transfer level: Needs assistance Equipment used: Rolling walker (2 wheels) Transfers: Sit to/from Stand, Bed to chair/wheelchair/BSC Sit to Stand: Supervision     Step pivot transfers: Supervision     General transfer comment: able to go from EOB to sink without RW. RW for increased distances     Balance Overall balance assessment: Needs assistance Sitting-balance support: No upper extremity  supported, Feet supported Sitting balance-Leahy Scale: Good Sitting balance - Comments: seated on EOB upon entry   Standing balance support: No upper extremity supported, Bilateral upper extremity supported, During functional activity Standing balance-Leahy Scale: Fair Standing balance comment: able to stand at sink for self care tasks with no UE support                           ADL either performed or assessed with clinical judgement   ADL Overall ADL's : Needs assistance/impaired     Grooming: Wash/dry hands;Wash/dry face;Oral care;Supervision/safety;Standing           Upper Body Dressing : Set up;Sitting Upper Body Dressing Details (indicate cue type and reason): gown for back Lower Body Dressing: Minimal assistance;Sit to/from stand   Toilet Transfer: Supervision/safety;Rolling walker (2 wheels)                  Extremity/Trunk Assessment              Vision       Perception     Praxis      Cognition Arousal: Alert Behavior During Therapy: WFL for tasks assessed/performed Overall Cognitive Status: Within Functional Limits for tasks assessed                                 General Comments: alert and oriented but requires cues for safety with transfers        Exercises      Shoulder Instructions       General Comments SpO2 93-95% on 2 liters  Pertinent Vitals/ Pain       Pain Assessment Pain Assessment: Faces Faces Pain Scale: Hurts little more Pain Location: throat and back Pain Descriptors / Indicators: Discomfort, Grimacing, Sore Pain Intervention(s): Monitored during session, Repositioned  Home Living                                          Prior Functioning/Environment              Frequency  Min 1X/week        Progress Toward Goals  OT Goals(current goals can now be found in the care plan section)  Progress towards OT goals: Progressing toward goals  Acute Rehab OT  Goals Patient Stated Goal: get stronger OT Goal Formulation: With patient Time For Goal Achievement: 05/07/23 Potential to Achieve Goals: Good ADL Goals Pt Will Perform Lower Body Dressing: with modified independence Pt Will Transfer to Toilet: with modified independence Pt Will Perform Tub/Shower Transfer: with modified independence Additional ADL Goal #1: Pt to verbalize at least 3 energy conservation strategies to implement at home.  Plan      Co-evaluation                 AM-PAC OT 6 Clicks Daily Activity     Outcome Measure   Help from another person eating meals?: None Help from another person taking care of personal grooming?: A Little Help from another person toileting, which includes using toliet, bedpan, or urinal?: A Little Help from another person bathing (including washing, rinsing, drying)?: A Little Help from another person to put on and taking off regular upper body clothing?: None Help from another person to put on and taking off regular lower body clothing?: A Little 6 Click Score: 20    End of Session Equipment Utilized During Treatment: Gait belt;Rolling walker (2 wheels);Oxygen (2 liters)  OT Visit Diagnosis: Muscle weakness (generalized) (M62.81);Pain Pain - part of body:  (back and throat)   Activity Tolerance Patient tolerated treatment well   Patient Left in bed;with call bell/phone within reach;Other (comment) (seated on EOB)   Nurse Communication Mobility status        Time: 9177-9150 OT Time Calculation (min): 27 min  Charges: OT General Charges $OT Visit: 1 Visit OT Treatments $Self Care/Home Management : 23-37 mins  Dick Laine, OTA Acute Rehabilitation Services  Office [phone]   Jeb LITTIE Laine 04/28/2023, 11:27 AM

## 2023-04-28 NOTE — Progress Notes (Signed)
 Mobility Specialist Progress Note:   04/28/23 1544  Mobility  Activity Ambulated with assistance in hallway  Level of Assistance Standby assist, set-up cues, supervision of patient - no hands on  Assistive Device Front wheel walker  Distance Ambulated (ft) 470 ft  Activity Response Tolerated well  Mobility Referral Yes  Mobility visit 1 Mobility  Mobility Specialist Start Time (ACUTE ONLY) 1528  Mobility Specialist Stop Time (ACUTE ONLY) 1540  Mobility Specialist Time Calculation (min) (ACUTE ONLY) 12 min   Pt received in bed, agreeable to mobility. Denied any discomfort during ambulation, asx on 2 L. VSS. Pt returned to bed with call bell in reach and all needs met.   Eddie Graham  Mobility Specialist Please contact via [employer] office at [phone]

## 2023-04-28 NOTE — Plan of Care (Signed)
   Problem: Health Behavior/Discharge Planning: Goal: Ability to manage health-related needs will improve Outcome: Progressing

## 2023-04-29 DIAGNOSIS — I5023 Acute on chronic systolic (congestive) heart failure: Secondary | ICD-10-CM | POA: Diagnosis not present

## 2023-04-29 LAB — GLUCOSE, CAPILLARY
Glucose-Capillary: 165 mg/dL — ABNORMAL HIGH (ref 70–99)
Glucose-Capillary: 198 mg/dL — ABNORMAL HIGH (ref 70–99)
Glucose-Capillary: 219 mg/dL — ABNORMAL HIGH (ref 70–99)
Glucose-Capillary: 202 mg/dL — ABNORMAL HIGH (ref 70–99)
Glucose-Capillary: 211 mg/dL — ABNORMAL HIGH (ref 70–99)
Glucose-Capillary: 265 mg/dL — ABNORMAL HIGH (ref 70–99)

## 2023-04-29 LAB — BASIC METABOLIC PANEL
Anion gap: 10 (ref 5–15)
BUN: 34 mg/dL — ABNORMAL HIGH (ref 8–23)
CO2: 26 mmol/L (ref 22–32)
Calcium: 8.8 mg/dL — ABNORMAL LOW (ref 8.9–10.3)
Chloride: 99 mmol/L (ref 98–111)
Creatinine, Ser: 1.75 mg/dL — ABNORMAL HIGH (ref 0.61–1.24)
GFR, Estimated: 37 mL/min — ABNORMAL LOW (ref >60)
Glucose, Bld: 140 mg/dL — ABNORMAL HIGH (ref 70–99)
Potassium: 3.5 mmol/L (ref 3.5–5.1)
Sodium: 135 mmol/L (ref 135–145)

## 2023-04-29 MED ORDER — FUROSEMIDE 40 MG PO TABS
40.0000 mg | ORAL_TABLET | Freq: Every day | ORAL | Status: DC
  Administered 2023-04-29 – 2023-05-03 (×5): 40 mg via ORAL
  Filled 2023-04-29 (×5): qty 1

## 2023-04-29 MED ORDER — POTASSIUM CHLORIDE CRYS ER 20 MEQ PO TBCR
30.0000 meq | EXTENDED_RELEASE_TABLET | ORAL | Status: AC
Start: 2023-04-29 — End: 2023-04-29
  Administered 2023-04-29 (×2): 30 meq via ORAL
  Filled 2023-04-29 (×2): qty 1

## 2023-04-29 NOTE — TOC Progression Note (Signed)
 Transition of Care [facility]) - Progression Note    Patient Details  Name: Zakary Kimura MRN: [id number] Date of Birth: 12/04/1934  [facility] CM/SW Contact  Montie LOISE Louder, [place] Phone Number: 04/29/2023, 9:59 AM  Clinical Narrative:     CSW called insurance - started authorization for [facility] reference # [id number].   TOC continues to follow and assist with discharge planning.  Montie Louder, MSW, LCSW Clinical Social Worker    Expected Discharge Plan: Skilled Nursing Facility Barriers to Discharge: English As A Second Language Teacher, Continued Medical Work up  Expected Discharge Plan and Services                                               Social Determinants of Health (SDOH) Interventions SDOH Screenings   Food Insecurity: Food Insecurity Present (04/23/2023)  Housing: Low Risk  (04/23/2023)  Recent Concern: Housing - High Risk (03/01/2023)  Transportation Needs: Unmet Transportation Needs (04/23/2023)  Utilities: Not At Risk (04/23/2023)  Financial Resource Strain: Low Risk  (08/29/2021)  Social Connections: Unknown (04/27/2023)  Tobacco Use: Low Risk  (04/22/2023)    Readmission Risk Interventions     No data to display

## 2023-04-29 NOTE — Progress Notes (Signed)
 Dr. DANNIAL NOTE    Eddie Graham  FMW:[id number] DOB: Sep 09, 1934 DOA: 04/22/2023 PCP: Eddie Graham HERO., MD    Brief Narrative:   Eddie Graham is a 89 y.o. male with past medical history significant for chronic systolic congestive heart failure (LVEF 25-30%), CAD s/p CABG 2011, paroxysmal atrial fibrillation/flutter on Eliquis , HTN, HLD, COPD, chronic hypoxic respiratory failure on 4 L nasal cannula at baseline, CKD stage IIIb, anemia of chronic medical/renal disease, abdominal aortic aneurysm, high-grade bladder cancer s/p TURP who presented to [facility] ED on 12/26 from home via EMS with complaints of chest pain and shortness of breath.  Patient also complaining of orthopnea, pleuritic chest pain.  On EMS arrival, patient was found off of his oxygen with SpO2 in the 70s.  In the ED, patient was noted to be profoundly dyspneic.  Temperature 97.8 F, HR 69, RR 18, BP 152/73, SpO2 100% on 3 L nasal cannula.  WBC 7.7, hemoglobin 14.7, platelet count 197.  Sodium 139, potassium 3.4, chloride 104, CO2 26, glucose 139, BUN 16, creatinine 1.21.  AST 17, ALT 19, total bilirubin 1.2.  BNP 841.2.  High sensitive troponin 23 followed by 19.  D-dimer 0.80.  COVID/influenza/RSV PCR negative.  Chest x-ray with mild bibasilar subsegmental atelectasis with small bilateral pleural effusions.  Patient was given a breathing treatment by EDP.  TRH consulted for admission for further evaluation management of decompensated CHF in setting of medication noncompliance/nonadherence.  Patient now with 4 admissions in the past 6 weeks, last discharged on 04/17/2023 to home.  Suspect medical noncompliance.   Assessment & Plan:   Acute respiratory failure without hypoxia Acute on chronic systolic congestive heart failure exacerbation Essential hypertension Patient presenting to ED with progressive dyspnea, orthopnea, lower extremity edema in the setting of medication noncompliance, nonadherence to his  chronic home oxygen.  Was found by EMS with SpO2 in the 70s.  Workup in the ED, patient was afebrile without leukocytosis, BNP elevated with concerns of vascular congestion on chest x-ray.  COVID/influenza/RSV PCR negative.  TTE 04/12/2023 with LVEF 25-30%, LV with global hypokinesis, biatrial enlargement, IVC dilated. -- net negative 2.6L past 24h and net negative 16.4L since admission -- Metoprolol  succinate 12.5 g p.o. daily (reduced from home dose 100mg /d) -- Furosemide  reduced to 40 mg p.o. daily today (1/2) -- Strict I's and O's Daily weights -- BMP in am  Hypokalemia Potassium 3.5 today, will replete -- Repeat electrolytes in a.m. to include magnesium  Chronic hypoxic respiratory failure on home O2 COPD, not in exacerbation -- Anoro Ellipta  1 puff daily -- Singulair  10 mg p.o. daily -- Albuterol  neb every 4 hours.  Wheezing/shortness of breath -- Continue supplemental oxygen, maintain SpO2 greater than 88%, on 4 L nasal cannula baseline  Paroxysmal atrial fibrillation -- Metoprolol  succinate 12.5 mg p.o. daily -- Eliquis  2.5 g p.o. twice daily  Hyperlipidemia -- Crestor  40 mg p.o. daily  Type 2 diabetes mellitus, with hyperglycemia Hemoglobin A1c 7.4 01/20/2023.  Home regimen includes Lantus  24 units Cruger daily and Jardiance  -- Semglee  25u [place]heim daily -- Sensitive SSI for coverage -- Jardiance  10 mg p.o. daily -- CBG before every meal/at bedtime  AKI on CKD stage IIIb Creatinine 1.21>1.84>1.90>2.02>1.99>1.59>1.57>1.75 -- reduced lasix  dose today as above -- BMP daily  CAD s/p CABG -- Crestor  40 mg p.o. nightly  Anemia of chronic medical/renal disease Iron deficiency anemia Hemoglobin 14.5, stable. -- Ferrous sulfate  325 mg p.o. daily  Abdominal aortic aneurysm Continue surveillance  Hx bladder cancer s/p TURP Outpatient  follow-up with urology  GERD --Protonix  40 mg p.o. daily  Weakness/deconditioning/debility: -- PT/OT recommend SNF placement, continue therapy  efforts while inpatient -- TOC consulted for placement   DVT prophylaxis: apixaban  (ELIQUIS ) tablet 2.5 mg Start: 04/25/23 2200 apixaban  (ELIQUIS ) tablet 2.5 mg    Code Status: Full Code Family Communication: No family present at bedside this morning  Disposition Plan:  Level of care: Telemetry Medical Status is: Inpatient Remains inpatient appropriate because: Pending SNF placement, medically stable for discharge once bed available    Consultants:  None  Procedures:  none  Antimicrobials:  none   Subjective: Patient seen examined bedside, resting calmly.  Sitting at edge of bed.  No complaints this morning.  Remains on 4 L oxygen, with SpO2 98% which is his baseline.  Awaiting placement.  Creatinine slightly bumped higher this morning will reduce Lasix  to 40 mg p.o. daily today.  No other specific complaints, concerns or questions at this time.  Denies headache, no dizziness, no chest pain, no palpitations, no shortness of breath, no abdominal pain, no fever/chills/night sweats, no nausea/vomiting/diarrhea, no focal weakness, no fatigue, no paresthesia.  No acute events overnight per nursing staff.  Objective: Vitals:   04/29/23 0735 04/29/23 0812 04/29/23 0814 04/29/23 1230  BP:  139/68 139/68 120/69  Pulse:  81 81 82  Resp:  20  18  Temp:  (!) 97 F (36.1 C)  98.6 F (37 C)  TempSrc:  Oral  Oral  SpO2: 98% 95%  94%  Weight:      Height:        Intake/Output Summary (Last 24 hours) at 04/29/2023 1325 Last data filed at 04/29/2023 1203 Gross per 24 hour  Intake --  Output 2400 ml  Net -2400 ml   Filed Weights   04/27/23 0313 04/28/23 0300 04/29/23 0713  Weight: 91.7 kg 92.4 kg 92.2 kg    Examination:  Physical Exam: GEN: NAD, alert and oriented x 3, chronically ill appearance, appears older than stated age HEENT: NCAT, PERRL, EOMI, sclera clear, MMM PULM: CTAB w/o w/r/r, noted that late expiratory wheezing bilateral upper lung fields, no crackles, normal  respiratory effort without accessory muscle use, on 4 L nasal cannula which is at baseline CV: Tachycardic, regular rhythm w/o M/G/R GI: abd soft, NTND, NABS, no R/G/M MSK: Trace bilateral lower extremity peripheral edema, muscle strength globally intact 5/5 bilateral upper/lower extremities NEURO: CN II-XII intact, no focal deficits, sensation to light touch intact PSYCH: normal mood/affect Integumentary: dry/intact, no rashes or wounds    Data Reviewed: I have personally reviewed following labs and imaging studies  CBC: Recent Labs  Lab 04/22/23 1445 04/23/23 0349 04/24/23 0313 04/25/23 0240 04/26/23 0305  WBC 7.7 8.7 10.0 9.3 8.2  NEUTROABS 5.6  --   --   --   --   HGB 14.7 14.8 14.9 14.6 14.5  HCT 47.3 47.6 46.0 45.4 44.9  MCV 89.8 88.8 87.8 87.0 87.2  PLT 197 196 208 209 192   Basic Metabolic Panel: Recent Labs  Lab 04/25/23 0240 04/26/23 0305 04/27/23 0356 04/28/23 0353 04/29/23 0316  NA 135 138 136 137 135  K 3.9 3.4* 3.7 3.2* 3.5  CL 95* 100 100 99 99  CO2 31 28 27 28 26   GLUCOSE 285* 210* 187* 158* 140*  BUN 37* 38* 35* 32* 34*  CREATININE 2.02* 1.99* 1.59* 1.57* 1.75*  CALCIUM  8.6* 8.5* 8.6* 8.7* 8.8*  MG  --   --  2.5*  --   --  GFR: Estimated Creatinine Clearance: 32.2 mL/min (A) (by C-G formula based on SCr of 1.75 mg/dL (H)). Liver Function Tests: Recent Labs  Lab 04/22/23 1445  AST 17  ALT 19  ALKPHOS 68  BILITOT 1.2*  PROT 6.4*  ALBUMIN 3.0*   No results for input(s): LIPASE, AMYLASE in the last 168 hours. No results for input(s): AMMONIA in the last 168 hours. Coagulation Profile: No results for input(s): INR, PROTIME in the last 168 hours. Cardiac Enzymes: No results for input(s): CKTOTAL, CKMB, CKMBINDEX, TROPONINI in the last 168 hours. BNP (last 3 results) No results for input(s): PROBNP in the last 8760 hours. HbA1C: No results for input(s): HGBA1C in the last 72 hours. CBG: Recent Labs  Lab  04/28/23 1640 04/28/23 2112 04/29/23 0623 04/29/23 0809 04/29/23 1201  GLUCAP 162* 214* 165* 198* 219*   Lipid Profile: No results for input(s): CHOL, HDL, LDLCALC, TRIG, CHOLHDL, LDLDIRECT in the last 72 hours. Thyroid Function Tests: No results for input(s): TSH, T4TOTAL, FREET4, T3FREE, THYROIDAB in the last 72 hours. Anemia Panel: No results for input(s): VITAMINB12, FOLATE, FERRITIN, TIBC, IRON, RETICCTPCT in the last 72 hours. Sepsis Labs: No results for input(s): PROCALCITON, LATICACIDVEN in the last 168 hours.  Recent Results (from the past 240 hours)  Resp panel by RT-PCR (RSV, Flu A&B, Covid) Anterior Nasal Swab     Status: None   Collection Time: 04/22/23  2:45 PM   Specimen: Anterior Nasal Swab  Result Value Ref Range Status   SARS Coronavirus 2 by RT PCR NEGATIVE NEGATIVE Final   Influenza A by PCR NEGATIVE NEGATIVE Final   Influenza B by PCR NEGATIVE NEGATIVE Final    Comment: (NOTE) The Xpert Xpress SARS-CoV-2/FLU/RSV plus assay is intended as an aid in the diagnosis of influenza from Nasopharyngeal swab specimens and should not be used as a sole basis for treatment. Nasal washings and aspirates are unacceptable for Xpert Xpress SARS-CoV-2/FLU/RSV testing.  Fact Sheet for Patients: bloggercourse.com  Fact Sheet for Healthcare Providers: seriousbroker.it  This test is not yet approved or cleared by the [place]  FDA and has been authorized for detection and/or diagnosis of SARS-CoV-2 by FDA under an Emergency Use Authorization (EUA). This EUA will remain in effect (meaning this test can be used) for the duration of the COVID-19 declaration under Section 564(b)(1) of the Act, 21 U.S.C. section 360bbb-3(b)(1), unless the authorization is terminated or revoked.     Resp Syncytial Virus by PCR NEGATIVE NEGATIVE Final    Comment: (NOTE) Fact Sheet for  Patients: bloggercourse.com  Fact Sheet for Healthcare Providers: seriousbroker.it  This test is not yet approved or cleared by the [place]  FDA and has been authorized for detection and/or diagnosis of SARS-CoV-2 by FDA under an Emergency Use Authorization (EUA). This EUA will remain in effect (meaning this test can be used) for the duration of the COVID-19 declaration under Section 564(b)(1) of the Act, 21 U.S.C. section 360bbb-3(b)(1), unless the authorization is terminated or revoked.  Performed at [facility], [address] [place], [place] [address]          Radiology Studies: No results found.      Scheduled Meds:  apixaban   2.5 mg Oral BID   DULoxetine   60 mg Oral Daily   empagliflozin   10 mg Oral QAC breakfast   ferrous sulfate   325 mg Oral Q breakfast   furosemide   40 mg Oral Daily   insulin  aspart  0-5 Units Subcutaneous QHS   insulin  aspart  0-9 Units Subcutaneous TID WC   insulin  glargine-yfgn  25 Units Subcutaneous Daily   metoprolol  succinate  25 mg Oral Daily   montelukast   10 mg Oral QHS   pantoprazole   40 mg Oral BID   sodium chloride  flush  3-10 mL Intravenous Q12H   traZODone   50 mg Oral QHS   umeclidinium-vilanterol  1 puff Inhalation Daily   Continuous Infusions:   LOS: 7 days    Time spent: 48 minutes spent on chart review, discussion with nursing staff, consultants, updating family and interview/physical exam; more than 50% of that time was spent in counseling and/or coordination of care.    Eddie Graham Orby Tangen, DO [facility] Available via Epic secure chat 7am-7pm After these hours, please refer to coverage provider listed on [email or web address] 04/29/2023, 1:25 PM

## 2023-04-29 NOTE — Progress Notes (Signed)
 Mobility Specialist Progress Note:   04/29/23 1505  Mobility  Activity Ambulated with assistance in hallway  Level of Assistance Standby assist, set-up cues, supervision of patient - no hands on  Assistive Device Front wheel walker  Distance Ambulated (ft) 470 ft  Activity Response Tolerated well  Mobility Referral Yes  Mobility visit 1 Mobility  Mobility Specialist Start Time (ACUTE ONLY) 1435  Mobility Specialist Stop Time (ACUTE ONLY) 1445  Mobility Specialist Time Calculation (min) (ACUTE ONLY) 10 min   Pt received in chair, agreeable to mobility. C/o lower back pain during ambulation, otherwise asx throughout. VSS. Ambulated on 2 L. Pt left in chair with call bell in reach and all needs met.   Brown Husband  Mobility Specialist Please contact via [employer] office at [phone]

## 2023-04-29 NOTE — Progress Notes (Signed)
 Physical Therapy Treatment Patient Details Name: Eddie Graham MRN: [id number] DOB: October 21, 1934 Today's Date: 04/29/2023   History of Present Illness Pt is an 89 y.o. male admitted 12/26 with acute exacerbation of CHF. He presented to the ED via EMS with c/o SOB and CP. Multiple recent hospitalizations with similar presentation. PMH: CAD, CHF, COPD, HTN, AAA, CKD, DM, neuropathy, a fib/flutter, colostomy, depression, back pain    PT Comments  Pt is progressing towards goals. Currently pt is Mod I with AD for all functional activities including bed mobility, sit to stand and gait. Pt gait speed is slightly declined and pt continues to require O2. Pt has had multiple hospitalizations recently with similar issues and would most likely benefit from a higher level of care. Due to pt current functional status, home set up and available assistance at home recommending skilled physical therapy services 3x/week in order to address strength, balance and functional mobility to decrease risk for falls, injury and re-hospitalization.      If plan is discharge home, recommend the following: A little help with walking and/or transfers;Assist for transportation;Assistance with cooking/housework   Can travel by private vehicle     Yes  Equipment Recommendations  None recommended by PT       Precautions / Restrictions Precautions Precautions: Fall;Other (comment) Precaution Comments: colostomy; watch SpO2 Restrictions Weight Bearing Restrictions Per Provider Order: No     Mobility  Bed Mobility Overal bed mobility: Modified Independent Bed Mobility: Supine to Sit     Supine to sit: Modified independent (Device/Increase time)     General bed mobility comments: HOB lowered no use of rails.    Transfers Overall transfer level: Modified independent Equipment used: Rolling walker (2 wheels) Transfers: Sit to/from Stand Sit to Stand: Modified independent (Device/Increase time)            General transfer comment: Mod I no instability on standing. Good hand placement.    Ambulation/Gait Ambulation/Gait assistance: Modified independent (Device/Increase time) Gait Distance (Feet): 400 Feet Assistive device: Rolling walker (2 wheels) Gait Pattern/deviations: Step-through pattern, Decreased stride length Gait velocity: decreased Gait velocity interpretation: 1.31 - 2.62 ft/sec, indicative of limited community ambulator   General Gait Details: Amb on 4L with SpO2 in 90s. HR into 110s       Balance Overall balance assessment: Mild deficits observed, not formally tested, Modified Independent   Sitting balance-Leahy Scale: Normal     Standing balance support: No upper extremity supported, Bilateral upper extremity supported, During functional activity Standing balance-Leahy Scale: Good        Cognition Arousal: Alert Behavior During Therapy: WFL for tasks assessed/performed Overall Cognitive Status: Within Functional Limits for tasks assessed       General Comments: pt declined interpretor states he only needs one with MD           General Comments General comments (skin integrity, edema, etc.): SPO2 in the 90's at rest with activity 4L pt was at 93-94% SPo2      Pertinent Vitals/Pain Pain Assessment Pain Assessment: Faces Faces Pain Scale: Hurts a little bit Pain Location: throat and back Pain Descriptors / Indicators: Discomfort, Grimacing, Sore Pain Intervention(s): Monitored during session    PT Goals (current goals can now be found in the care plan section) Acute Rehab PT Goals Patient Stated Goal: home PT Goal Formulation: With patient Time For Goal Achievement: 05/07/23 Potential to Achieve Goals: Good Progress towards PT goals: Progressing toward goals    Frequency    Min 1X/week  PT Plan  POC updated       AM-PAC PT 6 Clicks Mobility   Outcome Measure  Help needed turning from your back to your side while in a flat  bed without using bedrails?: None Help needed moving from lying on your back to sitting on the side of a flat bed without using bedrails?: None Help needed moving to and from a bed to a chair (including a wheelchair)?: None Help needed standing up from a chair using your arms (e.g., wheelchair or bedside chair)?: None Help needed to walk in hospital room?: None Help needed climbing 3-5 steps with a railing? : A Little 6 Click Score: 23    End of Session Equipment Utilized During Treatment: Oxygen;Gait belt Activity Tolerance: Patient tolerated treatment well Patient left: in chair;with call bell/phone within reach;with chair alarm set Nurse Communication: Mobility status PT Visit Diagnosis: Other abnormalities of gait and mobility (R26.89)     Time: 8872-8848 PT Time Calculation (min) (ACUTE ONLY): 24 min  Charges:    $Therapeutic Activity: 23-37 mins PT General Charges $$ ACUTE PT VISIT: 1 Visit                    Dorothyann Maier, DPT, CLT  Acute Rehabilitation Services Office: [phone] (Secure chat preferred)    Dorothyann VEAR Maier 04/29/2023, 1:00 PM

## 2023-04-30 ENCOUNTER — Ambulatory Visit: Payer: 59 | Admitting: Physician Assistant

## 2023-04-30 DIAGNOSIS — I5023 Acute on chronic systolic (congestive) heart failure: Secondary | ICD-10-CM | POA: Diagnosis not present

## 2023-04-30 LAB — BASIC METABOLIC PANEL
Anion gap: 11 (ref 5–15)
BUN: 35 mg/dL — ABNORMAL HIGH (ref 8–23)
CO2: 24 mmol/L (ref 22–32)
Calcium: 8.8 mg/dL — ABNORMAL LOW (ref 8.9–10.3)
Chloride: 100 mmol/L (ref 98–111)
Creatinine, Ser: 1.78 mg/dL — ABNORMAL HIGH (ref 0.61–1.24)
GFR, Estimated: 37 mL/min — ABNORMAL LOW (ref >60)
Glucose, Bld: 184 mg/dL — ABNORMAL HIGH (ref 70–99)
Potassium: 4 mmol/L (ref 3.5–5.1)
Sodium: 135 mmol/L (ref 135–145)

## 2023-04-30 LAB — CBC
HCT: 47.3 % (ref 39.0–52.0)
Hemoglobin: 15 g/dL (ref 13.0–17.0)
MCH: 27.8 pg (ref 26.0–34.0)
MCHC: 31.7 g/dL (ref 30.0–36.0)
MCV: 87.8 fL (ref 80.0–100.0)
Platelets: 200 10*3/uL (ref 150–400)
RBC: 5.39 MIL/uL (ref 4.22–5.81)
RDW: 15.3 % (ref 11.5–15.5)
WBC: 8 10*3/uL (ref 4.0–10.5)
nRBC: 0 % (ref 0.0–0.2)

## 2023-04-30 LAB — GLUCOSE, CAPILLARY
Glucose-Capillary: 197 mg/dL — ABNORMAL HIGH (ref 70–99)
Glucose-Capillary: 232 mg/dL — ABNORMAL HIGH (ref 70–99)
Glucose-Capillary: 209 mg/dL — ABNORMAL HIGH (ref 70–99)
Glucose-Capillary: 210 mg/dL — ABNORMAL HIGH (ref 70–99)

## 2023-04-30 LAB — MAGNESIUM: Magnesium: 2.5 mg/dL — ABNORMAL HIGH (ref 1.7–2.4)

## 2023-04-30 MED ORDER — MENTHOL 3 MG MT LOZG: 1.0000 | LOZENGE | OROMUCOSAL | Status: DC | PRN

## 2023-04-30 NOTE — Progress Notes (Signed)
 Occupational Therapy Treatment Patient Details Name: Eddie Graham MRN: [id number] DOB: 07/05/1934 Today's Date: 04/30/2023   History of present illness Pt is an 89 y.o. male admitted 12/26 with acute exacerbation of CHF. He presented to the ED via EMS with c/o SOB and CP. Multiple recent hospitalizations with similar presentation. PMH: CAD, CHF, COPD, HTN, AAA, CKD, DM, neuropathy, a fib/flutter, colostomy, depression, back pain   OT comments  Pt. Seen for skilled OT treatment.  Pt. Continues making gains with mobility and ADLs.  Able to complete LB dressing seated with set up.  Ambulation to/from b.room with S.  Simulated shower stall transfer with CGA including stepping over est. Ledge height in/out of shower stall.  Reports having grab bars in b.room and shower stall.  With pts. Functional gains with mobility and ADLs recommend skilled HHOT services to address increasing safety and independence with ADLs to decrease risk of falls, injury, and re-hospitalization.  Will alert OTR/L to update d/c recommendations for HHOT and pt. Would also benefit from 3N1 for home use.        If plan is discharge home, recommend the following:  A little help with walking and/or transfers;A little help with bathing/dressing/bathroom;Assist for transportation   Equipment Recommendations  Other (comment) -3n1    Recommendations for Other Services  HHOT-OTR/L to update d/c and equipment recommendations    Precautions / Restrictions Precautions Precautions: Fall;Other (comment) Precaution Comments: colostomy; watch SpO2 Restrictions Weight Bearing Restrictions Per Provider Order: No       Mobility Bed Mobility               General bed mobility comments: seated eob beginning and end of session    Transfers Overall transfer level: Modified independent Equipment used: Rolling walker (2 wheels) Transfers: Sit to/from Stand, Bed to chair/wheelchair/BSC Sit to Stand: Modified  independent (Device/Increase time)     Step pivot transfers: Supervision     General transfer comment: Mod I no instability on standing. Good hand placement.     Balance                                           ADL either performed or assessed with clinical judgement   ADL Overall ADL's : Needs assistance/impaired                     Lower Body Dressing: Set up;Sitting/lateral leans Lower Body Dressing Details (indicate cue type and reason): figure 4 for demo of accessing LEs for LB dressing tasks Toilet Transfer: Supervision/safety;Rolling walker (2 wheels) Toilet Transfer Details (indicate cue type and reason): simulated during ambulation to b.room for shower transfer     Tub/ Shower Transfer: Walk-in shower;Contact guard assist;Ambulation;Grab bars Tub/Shower Transfer Details (indicate cue type and reason): pt. reports rw can not fit in b.room but states there are grab bars throughout.  able to return demo in/out of shower stall with est. ledge height he has in his walk in shower at home.  reviewed use of 3n1 for shower seat. pt. interested but concerned it would not fit.  reviewed turning the chair sidways if needed. Functional mobility during ADLs: Supervision/safety General ADL Comments: moving well no physical support required just management of tubes, lines from therapist asst.    Extremity/Trunk Assessment              Vision  Perception     Praxis      Cognition Arousal: Alert Behavior During Therapy: WFL for tasks assessed/performed Overall Cognitive Status: Within Functional Limits for tasks assessed                                 General Comments: pt declined interpretor states he only needs one with MD, RN also confirmed this        Exercises      Shoulder Instructions       General Comments      Pertinent Vitals/ Pain       Pain Assessment Pain Assessment: Faces Faces Pain Scale: Hurts a  little bit Pain Location: back Pain Descriptors / Indicators: Discomfort, Grimacing, Sore Pain Intervention(s): Repositioned, Limited activity within patient's tolerance  Home Living                                          Prior Functioning/Environment              Frequency  Min 1X/week        Progress Toward Goals  OT Goals(current goals can now be found in the care plan section)  Progress towards OT goals: Progressing toward goals     Plan      Co-evaluation                 AM-PAC OT 6 Clicks Daily Activity     Outcome Measure   Help from another person eating meals?: None Help from another person taking care of personal grooming?: A Little Help from another person toileting, which includes using toliet, bedpan, or urinal?: A Little Help from another person bathing (including washing, rinsing, drying)?: A Little Help from another person to put on and taking off regular upper body clothing?: None Help from another person to put on and taking off regular lower body clothing?: A Little 6 Click Score: 20    End of Session Equipment Utilized During Treatment: Rolling walker (2 wheels);Oxygen (2 liters)  OT Visit Diagnosis: Muscle weakness (generalized) (M62.81);Pain   Activity Tolerance Patient tolerated treatment well   Patient Left Other (comment);in bed;with call bell/phone within reach;with bed alarm set;with nursing/sitter in room (cna present at end of session)   Nurse Communication Other (comment) (rn stated ok for pt. to have more ice water , also verified interpreter not needed, pt. only requests when meeting with md)        Time: 1138-1201 OT Time Calculation (min): 23 min  Charges: OT General Charges $OT Visit: 1 Visit OT Treatments $Self Care/Home Management : 23-37 mins  Randall, COTA/L Acute Rehabilitation [phone]   CHRISTELLA Nest Lorraine-COTA/L 04/30/2023, 12:26 PM

## 2023-04-30 NOTE — Plan of Care (Signed)
 Reviewing plan of care. Problem: Education: Goal: Ability to describe self-care measures that may prevent or decrease complications (Diabetes Survival Skills Education) will improve Outcome: Progressing Goal: Individualized Educational Video(s) Outcome: Progressing   Problem: Fluid Volume: Goal: Ability to maintain a balanced intake and output will improve Outcome: Progressing   Problem: Health Behavior/Discharge Planning: Goal: Ability to identify and utilize available resources and services will improve Outcome: Progressing Goal: Ability to manage health-related needs will improve Outcome: Progressing   Problem: Metabolic: Goal: Ability to maintain appropriate glucose levels will improve Outcome: Progressing   Problem: Nutritional: Goal: Maintenance of adequate nutrition will improve Outcome: Progressing Goal: Progress toward achieving an optimal weight will improve Outcome: Progressing   Problem: Skin Integrity: Goal: Risk for impaired skin integrity will decrease Outcome: Progressing   Problem: Health Behavior/Discharge Planning: Goal: Ability to manage health-related needs will improve Outcome: Progressing   Problem: Clinical Measurements: Goal: Ability to maintain clinical measurements within normal limits will improve Outcome: Progressing Goal: Will remain free from infection Outcome: Progressing Goal: Diagnostic test results will improve Outcome: Progressing Goal: Respiratory complications will improve Outcome: Progressing Goal: Cardiovascular complication will be avoided Outcome: Progressing  Wendi Dash, RN

## 2023-04-30 NOTE — Progress Notes (Signed)
 This encounter was created in error - please disregard.

## 2023-04-30 NOTE — Hospital Course (Addendum)
 89 y.o. M with sCHF EF 25-30%, CAD s/p CABG remote, pAF on ELiquis , HTN, COPD, chronic respiratory failure on 4L at baseline, CKD IIIb, and hx bladder CA s/p TURBT who presented with chest discomfort and shortness of breath.       Assessment and Plan: Acute on chronic respiratory failure with hypoxia Acute on chronic systolic congestive heart failure Chest x-ray showed congestion, treated with IV Lasix , diuresed 16 L - Continue metoprolol , Lasix  - Continue Jardiance   Paroxysmal atrial fibrillation Remains in sinus - Continue Eliquis , metoprolol   AKI on CKD stage IIIb Creatinine peaked at 2.02 from baseline 1.3, now stabilized at 1.5-1.7  Coronary artery disease without angina -Continue Crestor   COPD -Continue LAMA, LABA  Hypokalemia Supplemented and resolved  Hyperlipidemia -Continue Crestor   Hypertension Blood pressure controlled - Continue Jardiance , furosemide , metoprolol   Diabetes with hyperglycemia Glucoses elevated -Continue glargine - Continue sliding scale corrections - Continue Jardiance   Anemia of chronic kidney disease Iron deficiency anemia Hemoglobin stable - Continue ferrous sulfate   History of abdominal aortic aneurysm -Outpatient follow-up  Bladder cancer status post TURP Outpatient follow-up      Objective:  History collected through video phonic interpreter   Elderly adult male, sitting up in bed, interactive and appropriate RRR, no murmurs, no peripheral edema, no JVD Respiratory rate normal, lung sounds diminished but no rales or wheezes appreciated Attention normal, affect normal, judgment and insight appear impaired but at baseline, moves upper extremities with normal strength and coordination, speech fluent in spnaish

## 2023-04-30 NOTE — TOC Progression Note (Signed)
 Transition of Care [facility]) - Progression Note    Patient Details  Name: Eddie Graham MRN: [id number] Date of Birth: [birth year]  Transition of Care [facility]) CM/SW Contact  Corean JAYSON Canary, RN Phone Number: 04/30/2023, 3:03 PM  Clinical Narrative:     Daughter called CM office and they sent her to my phone. She voiced concern regarding patient going home, The patient is awaiting SNF authorization for [facility], Spoke to her regarding time it takes for authorization.  Let CSW Vicci know she had called . She does not feel it is safe for him to return home.She lives in [place], but is coming down to see him and make arrangements for someone to be there when he does go home,  Expected Discharge Plan: Skilled Nursing Facility Barriers to Discharge: English As A Second Language Teacher, Continued Medical Work up  Expected Discharge Plan and Services      SNF                                         Social Determinants of Health (SDOH) Interventions SDOH Screenings   Food Insecurity: Food Insecurity Present (04/23/2023)  Housing: Low Risk  (04/23/2023)  Recent Concern: Housing - High Risk (03/01/2023)  Transportation Needs: Unmet Transportation Needs (04/23/2023)  Utilities: Not At Risk (04/23/2023)  Financial Resource Strain: Low Risk  (08/29/2021)  Social Connections: Unknown (04/27/2023)  Tobacco Use: Low Risk  (04/22/2023)    Readmission Risk Interventions     No data to display

## 2023-04-30 NOTE — Progress Notes (Signed)
 Mobility Specialist Progress Note:   04/30/23 1459  Mobility  Activity Ambulated with assistance in hallway  Level of Assistance Standby assist, set-up cues, supervision of patient - no hands on  Assistive Device Front wheel walker  Distance Ambulated (ft) 295 ft  Activity Response Tolerated well  Mobility Referral Yes  Mobility visit 1 Mobility  Mobility Specialist Start Time (ACUTE ONLY) 1440  Mobility Specialist Stop Time (ACUTE ONLY) 1450  Mobility Specialist Time Calculation (min) (ACUTE ONLY) 10 min   Pt received in bed, agreeable to mobility. C/o LLE pain during ambulation, otherwise asx throughout. VSS on 2 L. Pt returned to bed with all needs met. Bed alarm on. MD present in room.   Brown Husband  Mobility Specialist Please contact via [employer] office at [phone]

## 2023-04-30 NOTE — Progress Notes (Signed)
  Progress Note   Patient: Eddie Graham FMW:[id number] DOB: March 08, 1935 DOA: 04/22/2023     8 DOS: the patient was seen and examined on 04/30/2023 at 2:50 PM      Brief hospital course: 89 y.o. M with sCHF EF 25-30%, CAD s/p CABG remote, pAF on ELiquis , HTN, COPD, chronic respiratory failure on 4L at baseline, CKD IIIb, and hx bladder CA s/p TURBT who presented with chest discomfort and shortness of breath.       Assessment and Plan: Acute on chronic respiratory failure with hypoxia Acute on chronic systolic congestive heart failure Chest x-ray showed congestion, treated with IV Lasix , diuresed 16 L - Continue metoprolol , Lasix  - Continue Jardiance   Paroxysmal atrial fibrillation Remains in sinus - Continue Eliquis , metoprolol   AKI on CKD stage IIIb Creatinine peaked at 2.02 from baseline 1.3, now stabilized at 1.5-1.7  Coronary artery disease without angina -Continue Crestor   COPD -Continue LAMA, LABA  Hypokalemia Supplemented and resolved  Hyperlipidemia -Continue Crestor   Hypertension Blood pressure controlled - Continue Jardiance , furosemide , metoprolol   Diabetes with hyperglycemia Glucoses elevated -Continue glargine - Continue sliding scale corrections - Continue Jardiance   Anemia of chronic kidney disease Iron deficiency anemia Hemoglobin stable - Continue ferrous sulfate   History of abdominal aortic aneurysm -Outpatient follow-up  Bladder cancer status post TURP Outpatient follow-up      Objective: BP 123/68 (BP Location: Left Arm)   Pulse 73   Temp 97.7 F (36.5 C) (Oral)   Resp 14   Ht 5' 6 (1.676 m)   Wt 92.2 kg   SpO2 93%   BMI 32.81 kg/m   History collected through video phonic interpreter [phone] Elderly adult male, sitting up in bed, interactive and appropriate RRR, no murmurs, no peripheral edema, no JVD Respiratory rate normal, lung sounds diminished but no rales or wheezes appreciated Abdomen soft no  tenderness palpation or guarding Attention normal, affect normal, judgment and insight appear impaired but at baseline (for example, he asks for the interpreter, then he speaks to them in broken english rather than spanish, he answers questions tangentially or not at all) oriented to self, [facility], year.  Most upper extremity strength normal, symmetric, face symmetric, speech fluent      Data Reviewed: Basic metabolic panel shows resolved hypokalemia, creatinine stable at 1.78  Family Communication: None present    Disposition: Status is: Inpatient         Author: Lonni SHAUNNA Dalton, MD 04/30/2023 4:14 PM  For on call review [email or web address].

## 2023-05-01 DIAGNOSIS — I5023 Acute on chronic systolic (congestive) heart failure: Secondary | ICD-10-CM | POA: Diagnosis not present

## 2023-05-01 LAB — GLUCOSE, CAPILLARY
Glucose-Capillary: 168 mg/dL — ABNORMAL HIGH (ref 70–99)
Glucose-Capillary: 232 mg/dL — ABNORMAL HIGH (ref 70–99)
Glucose-Capillary: 204 mg/dL — ABNORMAL HIGH (ref 70–99)
Glucose-Capillary: 231 mg/dL — ABNORMAL HIGH (ref 70–99)

## 2023-05-01 MED ORDER — ZOLPIDEM TARTRATE 5 MG PO TABS
2.5000 mg | ORAL_TABLET | Freq: Once | ORAL | Status: AC
  Administered 2023-05-01: 2.5 mg via ORAL
  Filled 2023-05-01: qty 1

## 2023-05-01 MED ORDER — INSULIN GLARGINE-YFGN 100 UNIT/ML ~~LOC~~ SOLN
28.0000 [IU] | Freq: Every day | SUBCUTANEOUS | Status: DC
  Administered 2023-05-01 – 2023-05-03 (×3): 28 [IU] via SUBCUTANEOUS
  Filled 2023-05-01 (×3): qty 0.28

## 2023-05-01 NOTE — Plan of Care (Signed)
  Problem: Fluid Volume: Goal: Ability to maintain a balanced intake and output will improve Outcome: Progressing   Problem: Health Behavior/Discharge Planning: Goal: Ability to identify and utilize available resources and services will improve Outcome: Progressing   Problem: Metabolic: Goal: Ability to maintain appropriate glucose levels will improve Outcome: Progressing   Problem: Nutritional: Goal: Maintenance of adequate nutrition will improve Outcome: Progressing   Problem: Skin Integrity: Goal: Risk for impaired skin integrity will decrease Outcome: Progressing   Problem: Clinical Measurements: Goal: Will remain free from infection Outcome: Progressing   Problem: Clinical Measurements: Goal: Respiratory complications will improve Outcome: Progressing   Problem: Activity: Goal: Risk for activity intolerance will decrease Outcome: Progressing   Problem: Nutrition: Goal: Adequate nutrition will be maintained Outcome: Progressing   Problem: Coping: Goal: Level of anxiety will decrease Outcome: Progressing   Problem: Elimination: Goal: Will not experience complications related to bowel motility Outcome: Progressing   Problem: Elimination: Goal: Will not experience complications related to urinary retention Outcome: Progressing   Problem: Pain Management: Goal: General experience of comfort will improve Outcome: Progressing   Problem: Safety: Goal: Ability to remain free from injury will improve Outcome: Progressing   Problem: Skin Integrity: Goal: Risk for impaired skin integrity will decrease Outcome: Progressing

## 2023-05-01 NOTE — Progress Notes (Signed)
 Pt received to 2 [facility] room 2 from 4 [place].  Pt oriented to room and call bell.  Male purewick applied per PCT.  Skin observed with charge nurse.  See assessment and vs's. Tele monitor applied per order.  NSR with BBB and occasional PVC's.  Lungs clear.  O2 at 2 [place].

## 2023-05-01 NOTE — Care Plan (Signed)
Spoke to daughter by phone. ?

## 2023-05-01 NOTE — Plan of Care (Signed)

## 2023-05-01 NOTE — Progress Notes (Signed)
  Progress Note   Patient: Eddie Graham FMW:[id number] DOB: [birth year] DOA: 04/22/2023     9 DOS: the patient was seen and examined on 05/01/2023 at 11:55AM      Brief hospital course: 89 y.o. M with sCHF EF 25-30%, CAD s/p CABG remote, pAF on ELiquis , HTN, COPD, chronic respiratory failure on 4L at baseline, CKD IIIb, and hx bladder CA s/p TURBT who presented with chest discomfort and shortness of breath.       Assessment and Plan: Acute on chronic respiratory failure with hypoxia Acute on chronic systolic congestive heart failure Chest x-ray showed congestion, treated with IV Lasix , diuresed 16 L - Continue metoprolol , Lasix  - Continue Jardiance   Paroxysmal atrial fibrillation Remains in sinus - Continue Eliquis , metoprolol   AKI on CKD stage IIIb Creatinine peaked at 2.02 from baseline 1.3, now stabilized at 1.5-1.7  Coronary artery disease without angina -Continue Crestor   COPD -Continue LAMA, LABA  Hypokalemia Supplemented and resolved  Hyperlipidemia -Continue Crestor   Hypertension Blood pressure controlled - Continue Jardiance , furosemide , metoprolol   Diabetes with hyperglycemia Glucoses elevated -Continue glargine - Continue sliding scale corrections - Continue Jardiance   Anemia of chronic kidney disease Iron deficiency anemia Hemoglobin stable - Continue ferrous sulfate   History of abdominal aortic aneurysm -Outpatient follow-up  Bladder cancer status post TURP Outpatient follow-up      Objective: BP (!) 147/67 (BP Location: Left Arm)   Pulse 76   Temp 97.8 F (36.6 C) (Oral)   Resp 20   Ht 5' 6 (1.676 m)   Wt 93.6 kg   SpO2 99%   BMI 33.31 kg/m   History collected through video phonic interpreter [phone]  Elderly adult male, sitting up in bed, interactive and appropriate RRR, no murmurs, no peripheral edema, no JVD Respiratory rate normal, lung sounds diminished but no rales or wheezes appreciated Attention normal,  affect normal, judgment and insight appear impaired but at baseline, moves upper extremities with normal strength and coordination, speech fluent in spnaish             Family Communication: Called to daughter twice, no answer    Disposition: Status is: Inpatient Medically stable for discharge to rehab        Author: Lonni SHAUNNA Dalton, MD 05/01/2023 2:25 PM  For on call review [email or web address].

## 2023-05-02 DIAGNOSIS — I5023 Acute on chronic systolic (congestive) heart failure: Secondary | ICD-10-CM | POA: Diagnosis not present

## 2023-05-02 LAB — GLUCOSE, CAPILLARY
Glucose-Capillary: 169 mg/dL — ABNORMAL HIGH (ref 70–99)
Glucose-Capillary: 254 mg/dL — ABNORMAL HIGH (ref 70–99)
Glucose-Capillary: 189 mg/dL — ABNORMAL HIGH (ref 70–99)
Glucose-Capillary: 279 mg/dL — ABNORMAL HIGH (ref 70–99)

## 2023-05-02 NOTE — Progress Notes (Signed)
  Progress Note   Patient: Eddie Graham FMW:[id number] DOB: Feb 18, 1935 DOA: 04/22/2023     10 DOS: the patient was seen and examined on 05/02/2023 at 11:40AM      Brief hospital course: 89 y.o. M with sCHF EF 25-30%, CAD s/p CABG remote, pAF on ELiquis , HTN, COPD, chronic respiratory failure on 4L at baseline, CKD IIIb, and hx bladder CA s/p TURBT who presented with chest discomfort and shortness of breath.       Assessment and Plan: Acute on chronic respiratory failure with hypoxia Acute on chronic systolic congestive heart failure Chest x-ray showed congestion, treated with IV Lasix , diuresed 16 L - Continue metoprolol , Lasix  - Continue Jardiance   Paroxysmal atrial fibrillation Remains in sinus - Continue Eliquis , metoprolol   AKI on CKD stage IIIb Creatinine peaked at 2.02 from baseline 1.3, now stabilized at 1.5-1.7  Coronary artery disease without angina -Continue Crestor   COPD -Continue LAMA, LABA  Hypokalemia Supplemented and resolved  Hyperlipidemia -Continue Crestor   Hypertension Blood pressure controlled - Continue Jardiance , furosemide , metoprolol   Diabetes with hyperglycemia Glucoses elevated -Continue glargine - Continue sliding scale corrections - Continue Jardiance   Anemia of chronic kidney disease Iron deficiency anemia Hemoglobin stable - Continue ferrous sulfate   History of abdominal aortic aneurysm -Outpatient follow-up  Bladder cancer status post TURP Outpatient follow-up      Objective: BP 124/66 (BP Location: Right Arm)   Pulse 74   Temp (!) 97.3 F (36.3 C) (Oral)   Resp 18   Ht 5' 6 (1.676 m)   Wt 93.4 kg   SpO2 98%   BMI 33.23 kg/m   History collected through video phonic interpreter   Elderly adult male, sitting up in bed, interactive and appropriate RRR, no murmurs, no peripheral edema, no JVD Respiratory rate normal, lung sounds diminished but no rales or wheezes appreciated Attention normal,  affect normal, judgment and insight appear impaired but at baseline, moves upper extremities with normal strength and coordination, speech fluent in spnaish                Disposition: Status is: Inpatient Remains medically stabilized for discharge to SNF when bed available        Author: Lonni SHAUNNA Dalton, MD 05/02/2023 1:10 PM  For on call review [email or web address].

## 2023-05-03 ENCOUNTER — Other Ambulatory Visit (HOSPITAL_COMMUNITY): Payer: Self-pay

## 2023-05-03 DIAGNOSIS — I5023 Acute on chronic systolic (congestive) heart failure: Secondary | ICD-10-CM | POA: Diagnosis not present

## 2023-05-03 LAB — GLUCOSE, CAPILLARY
Glucose-Capillary: 164 mg/dL — ABNORMAL HIGH (ref 70–99)
Glucose-Capillary: 247 mg/dL — ABNORMAL HIGH (ref 70–99)

## 2023-05-03 MED ORDER — LANTUS SOLOSTAR 100 UNIT/ML ~~LOC~~ SOPN
24.0000 [IU] | PEN_INJECTOR | Freq: Every day | SUBCUTANEOUS | 0 refills | Status: AC
  Filled 2023-05-03: qty 21, 87d supply, fill #0

## 2023-05-03 MED ORDER — ANORO ELLIPTA 62.5-25 MCG/ACT IN AEPB
1.0000 | INHALATION_SPRAY | Freq: Every day | RESPIRATORY_TRACT | 0 refills | Status: AC
  Filled 2023-05-03: qty 60, 30d supply, fill #0

## 2023-05-03 MED ORDER — FUROSEMIDE 40 MG PO TABS
40.0000 mg | ORAL_TABLET | Freq: Every day | ORAL | 0 refills | Status: AC
  Filled 2023-05-03: qty 30, 30d supply, fill #0

## 2023-05-03 MED ORDER — TECHLITE PEN NEEDLES 32G X 4 MM MISC
0 refills | Status: AC
  Filled 2023-05-03: qty 100, 30d supply, fill #0

## 2023-05-03 MED ORDER — POTASSIUM CHLORIDE CRYS ER 20 MEQ PO TBCR
20.0000 meq | EXTENDED_RELEASE_TABLET | Freq: Every day | ORAL | 0 refills | Status: AC
  Filled 2023-05-03: qty 30, 30d supply, fill #0

## 2023-05-03 MED ORDER — METOPROLOL SUCCINATE ER 100 MG PO TB24
100.0000 mg | ORAL_TABLET | Freq: Every day | ORAL | 0 refills | Status: AC
  Filled 2023-05-03: qty 30, 30d supply, fill #0

## 2023-05-03 MED ORDER — FERROUS SULFATE 325 (65 FE) MG PO TABS
325.0000 mg | ORAL_TABLET | Freq: Every day | ORAL | 0 refills | Status: AC
  Filled 2023-05-03: qty 30, 30d supply, fill #0

## 2023-05-03 MED ORDER — APIXABAN 2.5 MG PO TABS
2.5000 mg | ORAL_TABLET | Freq: Two times a day (BID) | ORAL | 0 refills | Status: AC
  Filled 2023-05-03: qty 60, 30d supply, fill #0

## 2023-05-03 NOTE — TOC Progression Note (Signed)
 Transition of Care [facility]) - Progression Note    Patient Details  Name: Eddie Graham MRN: [id number] Date of Birth: 1934/11/07  Transition of Care [facility] CM/SW Contact  Luann SHAUNNA [place], [place] Phone Number: 05/03/2023, 10:07 AM  Clinical Narrative:     Insurance portal shows SNF auth as pending still. CSW called to request update regarding SNF auth and informed peer to peer is being offered. Attending would need to call [phone] opt 5 by noon today. Attending notified.   Expected Discharge Plan: Skilled Nursing Facility Barriers to Discharge: Insurance Authorization, Continued Medical Work up  Expected Discharge Plan and Services         Expected Discharge Date: 05/03/23                                     Social Determinants of Health (SDOH) Interventions SDOH Screenings   Food Insecurity: Food Insecurity Present (04/23/2023)  Housing: Low Risk  (04/23/2023)  Recent Concern: Housing - High Risk (03/01/2023)  Transportation Needs: Unmet Transportation Needs (04/23/2023)  Utilities: Not At Risk (04/23/2023)  Financial Resource Strain: Low Risk  (08/29/2021)  Social Connections: Unknown (04/27/2023)  Tobacco Use: Low Risk  (04/22/2023)    Readmission Risk Interventions     No data to display

## 2023-05-03 NOTE — Discharge Summary (Signed)
 Physician Discharge Summary   Patient: Eddie Graham MRN: [id number] DOB: May 03, 1934  Admit date:     04/22/2023  Discharge date: 05/03/23  Discharge Physician: Lonni SHAUNNA Dalton   PCP: Delilah Murray HERO., MD     Recommendations at discharge:  Follow up with PCP Murray Delilah in 1 week for CHF Follow up with Cardiology as soon as able, appointment request sent Dr. Delilah: Please obtain CBC and BMP in 1 week Please ensure patient has appropriate follow up with Urology     Discharge Diagnoses: Principal Problem:   Acute exacerbation of congestive heart failure (HCC) Other hospital problems   Acute on chronic respiratory failure with hypoxia due to CHF   Paroxysmal atrial fibrillation   Acute kidney injury on CKD stage IIIb   Coronary artery disease without angina   Hypokalemia   Chronic obstructive pulmonary disease   Hyperlipidemia   Diabetes with hyperglycemia   Anemia of chronic kidney disease   Iron deficiency anemia   Abdominal aortic aneurysm   History of bladder cancer      Hospital Course: 89 y.o. M with sCHF EF 25-30%, CAD s/p CABG remote, pAF on ELiquis , HTN, COPD, chronic respiratory failure on 4L at baseline, CKD IIIb, and hx bladder CA s/p TURBT who presented with chest discomfort and shortness of breath.  Patient noted to have numerous admissions over the last 6 weeks, last discharged just a few days prior to admission.  Suspect medical noncompliance, worsening self-care at home.  This was discussed with family by phone, recommendations for additional support or moving into long-term care were made.  We did attempt to refer the patient to acute rehab postdischarge, but insurance denied as the patient was physically and mentally at his baseline.     Acute on chronic respiratory failure with hypoxia Acute on chronic systolic congestive heart failure Chest x-ray showed congestion, treated with IV Lasix , diuresed 16 L  Home Lasix , metoprolol ,  Jardiance  were resumed, and his symptoms were at baseline.   Paroxysmal atrial fibrillation Patient was treated with metoprolol  and Eliquis .  He remained in sinus rhythm in the hospital.     AKI on CKD stage IIIb Creatinine peaked at 2.02 from baseline 1.3, now stabilized at 1.5-1.7  Coronary artery disease without angina Hypertension Hyperlipidemia On Crestor , metoprolol , Jardiance   COPD No active flare.  On LAMA/LABA.  Hypokalemia Supplemented and resolved  Diabetes with hyperglycemia On Jardiance , glargine, semaglutide .  Anemia of chronic kidney disease Iron deficiency anemia Hemoglobin stable.  No clinical bleeding observed or reported.  History of abdominal aortic aneurysm -Outpatient follow-up  Bladder cancer status post TURP - Outpatient follow-up      Objective:  History collected through video phonic interpreter   89 y.o. male, sitting up in bed, interactive and appropriate RRR, no murmurs, no peripheral edema, no JVD Respiratory rate normal, lung sounds diminished but no rales or wheezes appreciated Attention normal, affect normal, judgment and insight appear impaired but at baseline, moves upper extremities with normal strength and coordination, speech fluent in spnaish        Assessment and Plan: No notes have been filed under this hospital service. Service: Hospitalist           The [place]  Controlled Substances Registry was reviewed for this patient prior to discharge.  Disposition: Home health SNF was pursued, but insurance denied.  Patient's home health services were maximized.   Diet recommendation:  Discharge Diet Orders (From admission, onward)     Start  Ordered   05/03/23 0000  Diet - low sodium heart healthy        05/03/23 0951             DISCHARGE MEDICATION: Allergies as of 05/03/2023   No Known Allergies      Medication List     STOP taking these medications    pantoprazole  40 MG  tablet Commonly known as: PROTONIX        TAKE these medications    acetaminophen  650 MG CR tablet Commonly known as: TYLENOL  Take 650 mg by mouth every 8 (eight) hours as needed for pain.   albuterol  108 (90 Base) MCG/ACT inhaler Commonly known as: VENTOLIN  HFA Inhale 2 puffs into the lungs every 4 (four) hours as needed for wheezing.   Anoro Ellipta  62.5-25 MCG/ACT Aepb Generic drug: umeclidinium-vilanterol Inhale 1 puff into the lungs daily.   apixaban  2.5 MG Tabs tablet Commonly known as: ELIQUIS  Take 1 tablet (2.5 mg total) by mouth 2 (two) times daily.   DULoxetine  60 MG capsule Commonly known as: CYMBALTA  Take 60 mg by mouth daily.   EYE DROPS OP Place 2 drops into both eyes as needed (dryness/itching).   ferrous sulfate  325 (65 FE) MG tablet Take 1 tablet (325 mg total) by mouth daily with breakfast.   furosemide  40 MG tablet Commonly known as: LASIX  Take 1 tablet (40 mg total) by mouth daily. Start taking on: May 04, 2023 What changed:  medication strength how much to take   gabapentin  100 MG capsule Commonly known as: NEURONTIN  Take 100-300 mg by mouth at bedtime as needed (leg pain).   Jardiance  10 MG Tabs tablet Generic drug: empagliflozin  Tome 1 tableta (10 mg en total) por va oral diariamente antes de desayunar. (Take 1 tablet (10 mg total) by mouth daily before breakfast.)   Lantus  SoloStar 100 UNIT/ML Solostar Pen Generic drug: insulin  glargine Inject 24 Units into the skin at bedtime.   Melatonin 5 MG Caps Take 5 mg by mouth at bedtime as needed (sleep).   metoprolol  succinate 100 MG 24 hr tablet Commonly known as: TOPROL -XL Take 1 tablet (100 mg total) by mouth daily. Take with or immediately following a meal.   montelukast  10 MG tablet Commonly known as: SINGULAIR  Take 10 mg by mouth at bedtime.   polyethylene glycol 17 g packet Commonly known as: MIRALAX  / GLYCOLAX  Take 17 g by mouth daily as needed for mild constipation.    potassium chloride  SA 20 MEQ tablet Commonly known as: KLOR-CON  M Take 1 tablet (20 mEq total) by mouth daily.   rosuvastatin  40 MG tablet Commonly known as: CRESTOR  Take 40 mg by mouth at bedtime.   Semaglutide  14 MG Tabs Take 14 mg by mouth daily.   TechLite Pen Needles 32G X 4 MM Misc Generic drug: Insulin  Pen Needle Use to inject Lantus  at bedtime as directed   traMADol  50 MG tablet Commonly known as: Ultram  Take 1 tablet (50 mg total) by mouth every 6 (six) hours as needed.   traZODone  50 MG tablet Commonly known as: DESYREL  Take 50 mg by mouth at bedtime.        Contact information for follow-up providers     Delilah Murray HERO., MD. Schedule an appointment as soon as possible for a visit in 1 week(s).   Specialty: Internal Medicine Contact information: [address] [place] [address] [phone]         Lavona Agent, MD. Schedule an appointment as soon  as possible for a visit.   Specialty: Cardiology Contact information: [address] [place] [address] [phone]              Contact information for after-discharge care     Destination     [facility]   Service: Skilled Nursing Contact information: [address] [place]  [address] [phone]                     Discharge Instructions     Diet - low sodium heart healthy   Complete by: As directed    Discharge instructions   Complete by: As directed    **IMPORTANT DISCHARGE INSTRUCTIONS**   From Dr. Jonel: You were admitted for a heart failure flare  This happened because of not taking your medicines.   For your heart, take the following medicines: Take furosemide  40 mg daily Take this with the potassium supplement  Take apixaban /Eliquis  2.5 mg twice daily  Take Jardiance  10 mg once daily  Take metoprolol  XL 100 mg once daily  Take rosuvastatin /Crestor  40 mg once daily at bedtime  Take  your insulin  injection onbce daily Take your semaglutide  daily  Use your Anoro inhaler once daily Use your oxygen at all times   Increase activity slowly   Complete by: As directed        Discharge Exam: Filed Weights   05/02/23 0500 05/02/23 0618 05/03/23 0500  Weight: 94.3 kg 93.4 kg 94 kg    General: Pt is alert, awake, not in acute distress, sitting on the edge of the bed, all history collected through video phonic interpreter [phone] Cardiovascular: RRR, nl S1-S2, no murmurs appreciated.   No LE edema.   Respiratory: Normal respiratory rate and rhythm.  CTAB without rales or wheezes. Abdominal: Abdomen soft and non-tender.  No distension or HSM.   Neuro/Psych: Strength symmetric in upper and lower extremities.  Judgment and insight appear at baseline.  He is oriented to person, place, time, and situation.   Condition at discharge: fair  The results of significant diagnostics from this hospitalization (including imaging, microbiology, ancillary and laboratory) are listed below for reference.   Imaging Studies: DG Chest 2 View Result Date: 04/22/2023 CLINICAL DATA:  Dyspnea. EXAM: CHEST - 2 VIEW COMPARISON:  April 10, 2023. FINDINGS: Stable cardiomegaly. Sternotomy wires are noted. Mild bibasilar subsegmental atelectasis is noted with small pleural effusions. IMPRESSION: Mild bibasilar subsegmental atelectasis with small bilateral pleural effusions. Electronically Signed   By: Lynwood Landy Raddle M.D.   On: 04/22/2023 15:41   CT SOFT TISSUE NECK WO CONTRAST Result Date: 04/16/2023 CLINICAL DATA:  Thyroid nodule EXAM: CT NECK WITHOUT CONTRAST TECHNIQUE: Multidetector CT imaging of the neck was performed following the standard protocol without intravenous contrast. RADIATION DOSE REDUCTION: This exam was performed according to the departmental dose-optimization program which includes automated exposure control, adjustment of the mA and/or kV according to patient size and/or use of  iterative reconstruction technique. COMPARISON:  None Available. FINDINGS: Pharynx and larynx: Normal. No mass or swelling. Salivary glands: No inflammation, mass, or stone. Thyroid: Normal.  No distinct nodule is seen in the thyroid. Lymph nodes: None enlarged or abnormal density. Vascular: Aortic and carotid atherosclerosis. Otherwise normal noncontrast appearance of the vasculature. Limited intracranial: Negative. Visualized orbits: Negative. Mastoids and visualized paranasal sinuses: Mucosal thickening in the ethmoid air cells. The mastoids are well aerated. Skeleton: Degenerative changes in the cervical spine. No acute osseous abnormality. Upper chest: Small  bilateral pleural effusions. Associated atelectasis. IMPRESSION: 1. No acute process in the neck. No distinct nodule is seen in the thyroid. If there is persistent concern for a thyroid abnormality, consider non emergent ultrasound of the thyroid. 2. Small bilateral pleural effusions. 3. Aortic atherosclerosis. Aortic Atherosclerosis (ICD10-I70.0). Electronically Signed   By: Donald Campion M.D.   On: 04/16/2023 16:11   ECHO TEE Result Date: 04/15/2023    TRANSESOPHOGEAL ECHO REPORT   Patient Name:   NATHANYL ANDUJO Date of Exam: 04/15/2023 Medical Rec #:  [id number]                 Height:       66.0 in Accession #:    [id number]                Weight:       210.4 lb Date of Birth:  [birth year]                 BSA:          2.043 m Patient Age:    88 years                  BP:           98/80 mmHg Patient Gender: M                         HR:           104 bpm. Exam Location:  Inpatient Procedure: Transesophageal Echo, Cardiac Doppler and Color Doppler Indications:     Arrhythmia  History:         Patient has prior history of Echocardiogram examinations, most                  recent 04/12/2023. Abnormal ECG, COPD; Risk                  Factors:Hypertension and Diabetes.  Sonographer:     Jayson Gaskins Referring Phys:  [id number] ONEIL BROCKS SKAINS  Diagnosing Phys: Oneil Parchment MD PROCEDURE: After discussion of the risks and benefits of a TEE, an informed consent was obtained from the patient. The transesophogeal probe was passed without difficulty through the esophogus of the patient. Sedation performed by different physician. The patient was monitored while under deep sedation. Anesthestetic sedation was provided intravenously by Anesthesiology: 120mg  of Propofol , 60mg  of Lidocaine . The patient's vital signs; including heart rate, blood pressure, and oxygen saturation; remained stable throughout the procedure. The patient developed no complications during the procedure. A successful direct current cardioversion was performed at 200 joules with 1 attempt.  IMPRESSIONS  1. Left ventricular ejection fraction, by estimation, is 25 to 30%. The left ventricle has severely decreased function. The left ventricle demonstrates global hypokinesis.  2. Right ventricular systolic function is normal. The right ventricular size is normal.  3. No left atrial/left atrial appendage thrombus was detected.  4. The mitral valve is normal in structure. Mild mitral valve regurgitation. No evidence of mitral stenosis.  5. Tricuspid valve regurgitation is moderate.  6. The aortic valve is tricuspid. Aortic valve regurgitation is mild. No aortic stenosis is present.  7. The inferior vena cava is normal in size with greater than 50% respiratory variability, suggesting right atrial pressure of 3 mmHg. Conclusion(s)/Recommendation(s): No LA/LAA thrombus identified. Successful cardioversion performed with restoration of normal sinus rhythm. FINDINGS  Left Ventricle: Left ventricular ejection fraction, by estimation, is 25 to 30%. The left ventricle  has severely decreased function. The left ventricle demonstrates global hypokinesis. The left ventricular internal cavity size was normal in size. There is no left ventricular hypertrophy. Right Ventricle: The right ventricular size is normal.  No increase in right ventricular wall thickness. Right ventricular systolic function is normal. Left Atrium: Left atrial size was normal in size. No left atrial/left atrial appendage thrombus was detected. Right Atrium: Right atrial size was normal in size. Pericardium: There is no evidence of pericardial effusion. Mitral Valve: The mitral valve is normal in structure. Mild mitral valve regurgitation. No evidence of mitral valve stenosis. Tricuspid Valve: The tricuspid valve is normal in structure. Tricuspid valve regurgitation is moderate . No evidence of tricuspid stenosis. Aortic Valve: The aortic valve is tricuspid. Aortic valve regurgitation is mild. No aortic stenosis is present. Pulmonic Valve: The pulmonic valve was normal in structure. Pulmonic valve regurgitation is not visualized. No evidence of pulmonic stenosis. Aorta: The aortic root is normal in size and structure. Venous: The inferior vena cava is normal in size with greater than 50% respiratory variability, suggesting right atrial pressure of 3 mmHg. IAS/Shunts: No atrial level shunt detected by color flow Doppler. Oneil Parchment MD Electronically signed by Oneil Parchment MD Signature Date/Time: 04/15/2023/5:04:46 PM    Final    EP STUDY Result Date: 04/15/2023 See surgical note for result.  DG Abd Portable 1V Result Date: 04/12/2023 CLINICAL DATA:  Abdominal pain EXAM: PORTABLE ABDOMEN - 1 VIEW COMPARISON:  CT chest abdomen and pelvis 04/10/2023. FINDINGS: The bowel gas pattern is normal. There is a large amount of stool throughout the colon. There are atherosclerotic calcifications of the aorta. Punctate densities in the left abdomen are favored as bowel content. IMPRESSION: 1. Large amount of stool throughout the colon. 2. No bowel obstruction. Electronically Signed   By: Greig Pique M.D.   On: 04/12/2023 23:03   ECHOCARDIOGRAM LIMITED Result Date: 04/12/2023    ECHOCARDIOGRAM LIMITED REPORT   Patient Name:   WYLDER MACOMBER  Date of Exam: 04/12/2023 Medical Rec #:  [id number]                 Height:       66.0 in Accession #:    [id number]                Weight:       207.9 lb Date of Birth:  [birth year]                 BSA:          2.033 m Patient Age:    88 years                  BP:           148/90 mmHg Patient Gender: M                         HR:           145 bpm. Exam Location:  Inpatient Procedure: Limited Echo and Intracardiac Opacification Agent Indications:    CHF-Acute Systolic I50.21  History:        Patient has prior history of Echocardiogram examinations, most                 recent 01/27/2023. Prior CABG, COPD; Risk Factors:Hypertension                 and Diabetes.  Sonographer:    Jayson Gaskins Referring Phys:  [id number] CAROLE N HALL IMPRESSIONS  1. Left ventricular ejection fraction, by estimation, is 25 to 30%. The left ventricle has severely decreased function. The left ventricle demonstrates global hypokinesis.  2. Right ventricular systolic function is moderately reduced. The right ventricular size is normal.  3. Left atrial size was mildly dilated.  4. Right atrial size was mildly dilated.  5. The mitral valve is normal in structure. Color Doppler not performed  6. The aortic valve is tricuspid. There is mild calcification of the aortic valve. Color Doppler not performed  7. The inferior vena cava is dilated in size with <50% respiratory variability, suggesting right atrial pressure of 15 mmHg. FINDINGS  Left Ventricle: Left ventricular ejection fraction, by estimation, is 25 to 30%. The left ventricle has severely decreased function. The left ventricle demonstrates global hypokinesis. The left ventricular internal cavity size was normal in size. There is no left ventricular hypertrophy. Right Ventricle: The right ventricular size is normal. No increase in right ventricular wall thickness. Right ventricular systolic function is moderately reduced. Left Atrium: Left atrial size was mildly dilated. Right Atrium: Right  atrial size was mildly dilated. Pericardium: There is no evidence of pericardial effusion. Mitral Valve: The mitral valve was not well visualized. Tricuspid Valve: The tricuspid valve is normal in structure. Tricuspid valve regurgitation is not demonstrated. No evidence of tricuspid stenosis. Aortic Valve: The aortic valve is tricuspid. There is mild calcification of the aortic valve. Pulmonic Valve: The pulmonic valve was not well visualized. Pulmonic valve regurgitation is not visualized. No evidence of pulmonic stenosis. Aorta: Aortic root could not be assessed. Venous: The inferior vena cava is dilated in size with less than 50% respiratory variability, suggesting right atrial pressure of 15 mmHg. IAS/Shunts: No atrial level shunt detected by color flow Doppler. LEFT VENTRICLE PLAX 2D LVIDd:         4.50 cm LVIDs:         4.00 cm LV PW:         0.90 cm LV IVS:        1.00 cm LVOT diam:     2.00 cm LVOT Area:     3.14 cm  IVC IVC diam: 2.30 cm LEFT ATRIUM             Index        RIGHT ATRIUM           Index LA Vol (A2C):   68.9 ml 33.89 ml/m  RA Area:     20.50 cm LA Vol (A4C):   36.0 ml 17.71 ml/m  RA Volume:   58.50 ml  28.78 ml/m LA Biplane Vol: 51.0 ml 25.09 ml/m   SHUNTS Systemic Diam: 2.00 cm Toribio Fuel MD Electronically signed by Toribio Fuel MD Signature Date/Time: 04/12/2023/9:22:03 AM    Final    CT Angio Chest/Abd/Pel for Dissection W and/or Wo Contrast Result Date: 04/10/2023 CLINICAL DATA:  Chest pain and shortness of breath. Acute aortic syndrome suspected EXAM: CT ANGIOGRAPHY CHEST, ABDOMEN AND PELVIS TECHNIQUE: Non-contrast CT of the chest was initially obtained. Multidetector CT imaging through the chest, abdomen and pelvis was performed using the standard protocol during bolus administration of intravenous contrast. Multiplanar reconstructed images and MIPs were obtained and reviewed to evaluate the vascular anatomy. RADIATION DOSE REDUCTION: This exam was performed  according to the departmental dose-optimization program which includes automated exposure control, adjustment of the mA and/or kV according to patient size and/or use of iterative reconstruction technique. CONTRAST:  75mL OMNIPAQUE  IOHEXOL   350 MG/ML SOLN COMPARISON:  Radiographs earlier today and CT abdomen and pelvis 02/25/2023 FINDINGS: CTA CHEST FINDINGS Cardiovascular: Coronary artery and aortic atherosclerotic calcification. No pericardial effusion. Irregularity about the ascending aortic wall is favored chronic/postoperative. No aortic aneurysm or dissection. Mediastinum/Nodes: Trachea and esophagus are unremarkable. No thoracic adenopathy. Lungs/Pleura: Emphysema. Small bilateral pleural effusions and associated atelectasis. No pneumothorax. Musculoskeletal: No acute fracture. Review of the MIP images confirms the above findings. CTA ABDOMEN AND PELVIS FINDINGS VASCULAR Aorta: No aneurysm or dissection. Broad-based penetrating atherosclerotic ulcer in the distal infrarenal abdominal aorta just proximal to the bifurcation. The base measures 1.5 x 1.0 cm with 1.0 cm in depth. The maximum aortic width at this site measures 3.3 cm. This is not substantially changed from 02/25/2023. Celiac: No hemodynamically significant stenosis. 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 SMA: Moderate narrowing at the origin secondary to calcified atherosclerotic plaque no aneurysm or dissection. Renals: Moderate narrowing at the origins of both renal arteries due to calcified atherosclerotic plaque. No aneurysm or dissection. IMA: Patent. Inflow: Scattered calcified atherosclerotic plaque without aneurysm or dissection. Veins: No obvious venous abnormality within the limitations of this arterial phase study. Review of the MIP images confirms the above findings. NON-VASCULAR Hepatobiliary: No focal liver abnormality is seen. No  gallstones, gallbladder wall thickening, or biliary dilatation. Pancreas: Unremarkable. Spleen: Unremarkable. Adrenals/Urinary Tract: Stable adrenal glands. No urinary calculi or hydronephrosis. Unremarkable bladder. Stomach/Bowel: Stomach is within normal limits. Normal caliber large and small bowel. Normal appendix. Postoperative change of partial colectomy with Hartmann's pouch and left lower quadrant colostomy. Question mild wall thickening about the colon entering the colostomy. Colonic diverticulosis without diverticulitis. Lymphatic: No lymphadenopathy. Reproductive: Prostate is unremarkable. Other: Small amount of fluid and mild stranding interposed between the proximal Hartmann's pouch and a loop of small bowel (series 7/image 161). No free intraperitoneal air. Musculoskeletal: No acute fracture. Review of the MIP images confirms the above findings. IMPRESSION: 1. Presumed postoperative change of ascending aortic aneurysm repair. Correlate with surgical history. 2. 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 Recommend referral to or continued care with a vascular specialist. 3. Small bilateral pleural effusions and associated atelectasis. 4. Postoperative change of partial colectomy with left lower quadrant colostomy. Question mild colitis of the colon entering the colostomy. 5. Small amount of fluid and mild stranding interposed between the proximal aspect of the patient's colectomy site and a loop of small bowel. This may be due to enteritis or pouchitis. No abscess or free air. Aortic Atherosclerosis (ICD10-I70.0) and Emphysema (ICD10-J43.9). Electronically Signed   By: Norman Gatlin M.D.   On: 04/10/2023 17:43   DG Chest Port 1 View Result Date: 04/10/2023 CLINICAL DATA:  Shortness of breath. EXAM: PORTABLE CHEST 1 VIEW COMPARISON:  Chest radiographs 02/25/2023, 01/28/2020,  01/26/2020, 05/24/2022; CT abdomen and pelvis 07/18/2022 FINDINGS: Status post median sternotomy. Cardiac silhouette is again mildly enlarged. Mediastinal contours are grossly within normal limits for AP technique. Chronic bilateral interstitial thickening and coarsening is unchanged from multiple prior radiographs. Left retrocardiac and costophrenic angle opacity is similar to 02/25/2023 and may represent pleural fluid and/or airspace disease. Note is made of a moderately large epicardial fat pad normally seen in this region on prior CT 07/18/2022. IMPRESSION: 1. Left retrocardiac and costophrenic angle opacity is similar to 02/25/2023. At least a  portion of this appears to represent the patient's normal epicardial fat pad. As on 02/25/2023, it is difficult to exclude superimposed mild left basilar atelectasis and/or pneumonia with possible mild pleural effusion. 2. Chronic bilateral interstitial thickening and coarsening is unchanged from multiple prior radiographs. Electronically Signed   By: Tanda Lyons M.D.   On: 04/10/2023 15:37    Microbiology: Results for orders placed or performed during the hospital encounter of 04/22/23  Resp panel by RT-PCR (RSV, Flu A&B, Covid) Anterior Nasal Swab     Status: None   Collection Time: 04/22/23  2:45 PM   Specimen: Anterior Nasal Swab  Result Value Ref Range Status   SARS Coronavirus 2 by RT PCR NEGATIVE NEGATIVE Final   Influenza A by PCR NEGATIVE NEGATIVE Final   Influenza B by PCR NEGATIVE NEGATIVE Final    Comment: (NOTE) The Xpert Xpress SARS-CoV-2/FLU/RSV plus assay is intended as an aid in the diagnosis of influenza from Nasopharyngeal swab specimens and should not be used as a sole basis for treatment. Nasal washings and aspirates are unacceptable for Xpert Xpress SARS-CoV-2/FLU/RSV testing.  Fact Sheet for Patients: bloggercourse.com  Fact Sheet for Healthcare  Providers: seriousbroker.it  This test is not yet approved or cleared by the [place]  FDA and has been authorized for detection and/or diagnosis of SARS-CoV-2 by FDA under an Emergency Use Authorization (EUA). This EUA will remain in effect (meaning this test can be used) for the duration of the COVID-19 declaration under Section 564(b)(1) of the Act, 21 U.S.C. section 360bbb-3(b)(1), unless the authorization is terminated or revoked.     Resp Syncytial Virus by PCR NEGATIVE NEGATIVE Final    Comment: (NOTE) Fact Sheet for Patients: bloggercourse.com  Fact Sheet for Healthcare Providers: seriousbroker.it  This test is not yet approved or cleared by the [place]  FDA and has been authorized for detection and/or diagnosis of SARS-CoV-2 by FDA under an Emergency Use Authorization (EUA). This EUA will remain in effect (meaning this test can be used) for the duration of the COVID-19 declaration under Section 564(b)(1) of the Act, 21 U.S.C. section 360bbb-3(b)(1), unless the authorization is terminated or revoked.  Performed at [facility], [address] [place], [place] [address]     Labs: CBC: Recent Labs  Lab 04/30/23 0432  WBC 8.0  HGB 15.0  HCT 47.3  MCV 87.8  PLT 200   Basic Metabolic Panel: Recent Labs  Lab 04/27/23 0356 04/28/23 0353 04/29/23 0316 04/30/23 0432  NA 136 137 135 135  K 3.7 3.2* 3.5 4.0  CL 100 99 99 100  CO2 27 28 26 24   GLUCOSE 187* 158* 140* 184*  BUN 35* 32* 34* 35*  CREATININE 1.59* 1.57* 1.75* 1.78*  CALCIUM  8.6* 8.7* 8.8* 8.8*  MG 2.5*  --   --  2.5*   Liver Function Tests: No results for input(s): AST, ALT, ALKPHOS, BILITOT, PROT, ALBUMIN in the last 168 hours. CBG: Recent Labs  Lab 05/02/23 0742 05/02/23 1208 05/02/23 1627 05/02/23 2030 05/03/23 0730  GLUCAP 169* 254* 189* 279* 164*    Discharge time spent:  approximately 45 minutes spent on discharge counseling, evaluation of patient on day of discharge, and coordination of discharge planning with nursing, social work, pharmacy and case management  Signed: Lonni SHAUNNA Dalton, MD [facility] 05/03/2023

## 2023-05-03 NOTE — TOC Transition Note (Signed)
 Transition of Care [facility]) - Discharge Note   Patient Details  Name: Eddie Graham MRN: [id number] Date of Birth: [birth year]  Transition of Care [facility]) CM/SW Contact:  Rosaline JONELLE Joe, RN Phone Number: 05/03/2023, 12:19 PM   Clinical Narrative:    CM spoke with the patient at the bedside prior to discharge home and patient is aware that insurance has declined him for SNF placement and he would be discharged home with family today.  Patient states that he has home oxygen and [place], family plans to assist with transportation home. \ I called [facility] and spoke with Lorretta, RNCM with [facility] and they plan to restart home health services.  HH orders are in place.  No other TOC needs and patient plans to return home today.  Bedside nursing plans to review discharge instructions/orders for home.   Final next level of care: Skilled Nursing Facility Barriers to Discharge: English As A Second Language Teacher, Continued Medical Work up   Patient Goals and CMS Choice            Discharge Placement                       Discharge Plan and Services Additional resources added to the After Visit Summary for                                       Social Drivers of Health (SDOH) Interventions SDOH Screenings   Food Insecurity: Food Insecurity Present (04/23/2023)  Housing: Low Risk  (04/23/2023)  Recent Concern: Housing - High Risk (03/01/2023)  Transportation Needs: Unmet Transportation Needs (04/23/2023)  Utilities: Not At Risk (04/23/2023)  Financial Resource Strain: Low Risk  (08/29/2021)  Social Connections: Unknown (04/27/2023)  Tobacco Use: Low Risk  (04/22/2023)     Readmission Risk Interventions    05/03/2023   12:18 PM  Readmission Risk Prevention Plan  Transportation Screening Complete  PCP or Specialist Appt within 3-5 Days Complete  HRI or Home Care Consult Complete  Social Work Consult for Recovery Care Planning/Counseling Complete   Palliative Care Screening Complete  Medication Review Oceanographer) Complete

## 2023-05-03 NOTE — Care Management Important Message (Signed)
 Important Message  Patient Details  Name: Eddie Graham MRN: [id number] Date of Birth: 1935-01-17   Important Message Given:  Yes - Medicare IM     Dorena Bodo 05/03/2023, 2:53 PM

## 2023-05-03 NOTE — Progress Notes (Signed)
 Physical Therapy Treatment Patient Details Name: Eddie Graham MRN: [id number] DOB: 07-Nov-1934 Today's Date: 05/03/2023   History of Present Illness Pt is an 89 y.o. male admitted 12/26 with acute exacerbation of CHF. He presented to the ED via EMS with c/o SOB and CP. Multiple recent hospitalizations with similar presentation. PMH: CAD, CHF, COPD, HTN, AAA, CKD, DM, neuropathy, a fib/flutter, colostomy, depression, back pain    PT Comments  Pt tolerates treatment very well, ambulating for community distances with support of RW. Pt denies DOE or SOB when ambulating, often requesting to do downstairs to the cafeteria or other areas of the hospital. Pt is progressing very well at this time. PT recommends discharge home with HHPT when medically appropriate.    If plan is discharge home, recommend the following: Assistance with cooking/housework   Can travel by private vehicle     Yes  Equipment Recommendations  None recommended by PT    Recommendations for Other Services       Precautions / Restrictions Precautions Precautions: Fall;Other (comment) Precaution Comments: colostomy; watch SpO2 Restrictions Weight Bearing Restrictions Per Provider Order: No     Mobility  Bed Mobility                    Transfers Overall transfer level: Modified independent Equipment used: Rolling walker (2 wheels) Transfers: Sit to/from Stand Sit to Stand: Modified independent (Device/Increase time)                Ambulation/Gait Ambulation/Gait assistance: Modified independent (Device/Increase time) Gait Distance (Feet): 1000 Feet Assistive device: Rolling walker (2 wheels) Gait Pattern/deviations: Step-through pattern Gait velocity: functional Gait velocity interpretation: 1.31 - 2.62 ft/sec, indicative of limited community ambulator   General Gait Details: steady step-through gait, no reports of SOB or DOE, pt reports being limited by back pain at times but wants  to continue ambulation for longer distances   Stairs             Wheelchair Mobility     Tilt Bed    Modified Rankin (Stroke Patients Only)       Balance Overall balance assessment: Needs assistance Sitting-balance support: No upper extremity supported, Feet supported Sitting balance-Leahy Scale: Good     Standing balance support: Single extremity supported, Reliant on assistive device for balance Standing balance-Leahy Scale: Poor                              Cognition Arousal: Alert Behavior During Therapy: WFL for tasks assessed/performed Overall Cognitive Status: Within Functional Limits for tasks assessed                                          Exercises      General Comments General comments (skin integrity, edema, etc.): pt on 2L [place], pt denies DOE or SOB, HR stable in low 100s      Pertinent Vitals/Pain Pain Assessment Pain Assessment: Faces Faces Pain Scale: Hurts little more Pain Location: back Pain Descriptors / Indicators: Aching Pain Intervention(s): Monitored during session    Home Living                          Prior Function            PT Goals (current goals can now be found in  the care plan section) Acute Rehab PT Goals Patient Stated Goal: home Progress towards PT goals: Progressing toward goals    Frequency    Min 1X/week      PT Plan      Co-evaluation              AM-PAC PT 6 Clicks Mobility   Outcome Measure  Help needed turning from your back to your side while in a flat bed without using bedrails?: None Help needed moving from lying on your back to sitting on the side of a flat bed without using bedrails?: None Help needed moving to and from a bed to a chair (including a wheelchair)?: None Help needed standing up from a chair using your arms (e.g., wheelchair or bedside chair)?: None Help needed to walk in hospital room?: None Help needed climbing 3-5 steps  with a railing? : None 6 Click Score: 24    End of Session Equipment Utilized During Treatment: Gait belt Activity Tolerance: Patient tolerated treatment well Patient left: in bed;with call bell/phone within reach Nurse Communication: Mobility status PT Visit Diagnosis: Other abnormalities of gait and mobility (R26.89)     Time: 9141-9080 PT Time Calculation (min) (ACUTE ONLY): 21 min  Charges:    $Gait Training: 8-22 mins PT General Charges $$ ACUTE PT VISIT: 1 Visit                     Bernardino JINNY Ruth, PT, DPT Acute Rehabilitation Office [phone]    Bernardino JINNY Ruth 05/03/2023, 9:26 AM

## 2023-05-13 ENCOUNTER — Ambulatory Visit: Payer: 59 | Attending: Physician Assistant | Admitting: Physician Assistant

## 2023-05-13 VITALS — BP 116/82 | HR 75 | Ht 66.0 in | Wt 206.4 lb

## 2023-05-13 DIAGNOSIS — N1832 Chronic kidney disease, stage 3b: Secondary | ICD-10-CM | POA: Diagnosis not present

## 2023-05-13 DIAGNOSIS — I1A0 Resistant hypertension: Secondary | ICD-10-CM | POA: Diagnosis not present

## 2023-05-13 DIAGNOSIS — I5022 Chronic systolic (congestive) heart failure: Secondary | ICD-10-CM

## 2023-05-13 DIAGNOSIS — I4892 Unspecified atrial flutter: Secondary | ICD-10-CM

## 2023-05-13 DIAGNOSIS — I25708 Atherosclerosis of coronary artery bypass graft(s), unspecified, with other forms of angina pectoris: Secondary | ICD-10-CM | POA: Diagnosis not present

## 2023-05-13 NOTE — Patient Instructions (Addendum)
Medication Instructions:  NO CHANGES *If you need a refill on your cardiac medications before your next appointment, please call your pharmacy*   Lab Work: BMET TODAY If you have labs (blood work) drawn today and your tests are completely normal, you will receive your results only by: MyChart Message (if you have MyChart) OR A paper copy in the mail If you have any lab test that is abnormal or we need to change your treatment, we will call you to review the results.   Testing/Procedures: NO TESTING   Follow-Up: At [facility], you and your health needs are our priority.  As part of our continuing mission to provide you with exceptional heart care, we have created designated Provider Care Teams.  These Care Teams include your primary Cardiologist (physician) and Advanced Practice Providers (APPs -  Physician Assistants and Nurse Practitioners) who all work together to provide you with the care you need, when you need it.  We recommend signing up for the patient portal called "MyChart".  Sign up information is provided on this After Visit Summary.  MyChart is used to connect with patients for Virtual Visits (Telemedicine).  Patients are able to view lab/test results, encounter notes, upcoming appointments, etc.  Non-urgent messages can be sent to your provider as well.   To learn more about what you can do with MyChart, go to [email or web address].    Your next appointment:   6 week(s)  Provider:   Azalee Course, PA-C    Then, Rollene Rotunda, MD will plan to see you again in 4 month(s).    Other Instructions CHECK WEIGHT DAILY AT SAME TIME EACH DAY ONCE SCALE IS OBTAIN FROM [facility]

## 2023-05-13 NOTE — Progress Notes (Signed)
Cardiology Office Note:  .   Date:  05/13/2023  ID:  Eddie Graham, DOB 1935-03-17, MRN [id number] PCP: Eddie Blower., MD  [facility] Providers Cardiologist:  Eddie Rotunda, MD     History of Present Illness: .   Eddie Graham is a 89 y.o. male  with PMH of CAD s/p CABG 2011, atrial flutter, chronic diverticulitis, urethral stricture, high-grade bladder cancer, anemia, AAA, COPD, CKD, hypertension and hyperlipidemia. He moved to [place] from [place] in 2017.  He has been followed by [facility] urology service for history of bladder cancer. He was diagnosed with bladder cancer in 2019 and underwent TURBT at that time.  He had gross hematuria and underwent a urethral balloon dilatation and TURBT in June 2023.  He also underwent a cystoscopy with urethral dilatation in August 2023.  He had resection of the perforated intramural sigmoid colonic abscess in January 2024 with end colostomy.  He was admitted at [facility] in February 2024 with acute GI bleed.  GI was consulted and did not feel patient required colonoscopy and suspected bleeding was either postop bleed versus diverticulitis.  His bleeding stopped during the hospitalization.  He was seen by urology service in September 2024 to schedule surveillance cystoscopy.  He had microscopic hematuria at the time.  Urology service was unable to transversed the scope through a urethral stricture.  He was scheduled to undergo diagnostic cystoscopy with possible TURP and urethral dilatation on 10/1.     When he presented for his procedure, he was found to be tachycardic with heart rate in the 130s.  EKG showed he was in atrial flutter with variable A-V rate, underlying right bundle branch block.  He was placed on IV metoprolol which later transition to metoprolol succinate.  Echocardiogram obtained on 01/27/2023 showed EF 40 to 45%, global hypokinesis, severe hypokinesis of the LV, basal mid inferoseptal wall,  inferior wall, and inferior lateral wall, mildly reduced RV systolic function, mild biatrial enlargement, mild dilated aortic root measuring at 38 mm.  Patient had some chest tightness during the hospitalization, however it was worse with deep inspiration and reproducible with palpation on exam.  He was started on Eliquis.  He was not placed on ARB or ARNI due to CKD.  He underwent cystoscopy with urethral dilatation by Dr. Arita Miss on 02/09/2023.  I previously recommended a PET stress test after the procedure given intermittent chest discomfort, however this was never done.  He was readmitted to the hospital in December 2024 with chest pain.  Serial troponin borderline flat not consistent with ACS.  Heart rate was elevated.  Repeat echocardiogram obtained on 04/12/2023 demonstrated EF has dropped to 25 to 30%, global hypokinesis, moderately reduced RV systolic function, mild biatrial enlargement.  It was suspected that this is primarily tachycardia mediated cardiomyopathy.  Patient ultimately underwent TEE DCCV on 04/15/2023.   Patient has since been readmitted to the hospital on 04/22/2023 with shortness of breath.  He was placed on 4 L nasal cannula.  BNP elevated at 840.  He was treated with IV Lasix and diuresed 16 L.  Creatinine peaked at 2.02 however stabilized at 1.5-1.8 at the time of discharge.  It was suspected medication noncompliance and worsening self-care at home.  It was discussed with family by phone and recommended additional support while moving into a long-term care.  Attempt was made to discharge patient to acute rehab however insurance denied it.  He remained in sinus rhythm during the hospitalization.  Patient presents today  for follow-up.  Interview was conducted with the help of Spanish translator Eddie Graham.  Patient continued to have intermittent chest discomfort.  He is due for a PET stress test on 06/02/2023.  I have discussed benefit and risk of the stress test.  He is aware to be  n.p.o. in the morning of the stress test, half the dose of insulin the night before, no caffeine for 12 hours prior to the stress test.  On physical exam, he appears to be euvolemic.  I will obtain a basic metabolic panel today to reassess his renal function to make sure he is tolerating the current dose of the diuretic.  During the recent hospitalization, his Lasix was increased from 20 mg to 40 mg daily.  He has been compliant with his medication.  I plan to see the patient back in 6 weeks, if he is still maintaining sinus rhythm at that time, I plan to obtain a 44-month repeat echocardiogram to make sure his EF is improving.  He can follow-up with Dr. [name] in 53-month.  On physical exam today, his heart rate is very regular, suggest the patient is holding sinus rhythm.  ROS:   Patient complains of chest pain.  He also has baseline shortness of breath with exertion and is on home oxygen.  He has no lower extremity edema, orthopnea or PND.  Studies Reviewed: .        Cardiac Studies & Procedures      ECHOCARDIOGRAM  ECHOCARDIOGRAM LIMITED 04/12/2023  Narrative ECHOCARDIOGRAM LIMITED REPORT    Patient Name:   Eddie Graham Date of Exam: 04/12/2023 Medical Rec #:  [id number]                 Height:       66.0 in Accession #:    [id number]                Weight:       207.9 lb Date of Birth:  1934-05-08                 BSA:          2.033 m Patient Age:    88 years                  BP:           148/90 mmHg Patient Gender: M                         HR:           145 bpm. Exam Location:  Inpatient  Procedure: Limited Echo and Intracardiac Opacification Agent  Indications:    CHF-Acute Systolic I50.21  History:        Patient has prior history of Echocardiogram examinations, most recent 01/27/2023. Prior CABG, COPD; Risk Factors:Hypertension and Diabetes.  Sonographer:    Darlys Gales Referring Phys: [id number] CAROLE N HALL  IMPRESSIONS   1. Left ventricular ejection  fraction, by estimation, is 25 to 30%. The left ventricle has severely decreased function. The left ventricle demonstrates global hypokinesis. 2. Right ventricular systolic function is moderately reduced. The right ventricular size is normal. 3. Left atrial size was mildly dilated. 4. Right atrial size was mildly dilated. 5. The mitral valve is normal in structure. Color Doppler not performed 6. The aortic valve is tricuspid. There is mild calcification of the aortic valve. Color Doppler not performed 7. The inferior vena cava is dilated  in size with <50% respiratory variability, suggesting right atrial pressure of 15 mmHg.  FINDINGS Left Ventricle: Left ventricular ejection fraction, by estimation, is 25 to 30%. The left ventricle has severely decreased function. The left ventricle demonstrates global hypokinesis. The left ventricular internal cavity size was normal in size. There is no left ventricular hypertrophy.  Right Ventricle: The right ventricular size is normal. No increase in right ventricular wall thickness. Right ventricular systolic function is moderately reduced.  Left Atrium: Left atrial size was mildly dilated.  Right Atrium: Right atrial size was mildly dilated.  Pericardium: There is no evidence of pericardial effusion.  Mitral Valve: The mitral valve was not well visualized.  Tricuspid Valve: The tricuspid valve is normal in structure. Tricuspid valve regurgitation is not demonstrated. No evidence of tricuspid stenosis.  Aortic Valve: The aortic valve is tricuspid. There is mild calcification of the aortic valve.  Pulmonic Valve: The pulmonic valve was not well visualized. Pulmonic valve regurgitation is not visualized. No evidence of pulmonic stenosis.  Aorta: Aortic root could not be assessed.  Venous: The inferior vena cava is dilated in size with less than 50% respiratory variability, suggesting right atrial pressure of 15 mmHg.  IAS/Shunts: No atrial level  shunt detected by color flow Doppler.  LEFT VENTRICLE PLAX 2D LVIDd:         4.50 cm LVIDs:         4.00 cm LV PW:         0.90 cm LV IVS:        1.00 cm LVOT diam:     2.00 cm LVOT Area:     3.14 cm   IVC IVC diam: 2.30 cm  LEFT ATRIUM             Index        RIGHT ATRIUM           Index LA Vol (A2C):   68.9 ml 33.89 ml/m  RA Area:     20.50 cm LA Vol (A4C):   36.0 ml 17.71 ml/m  RA Volume:   58.50 ml  28.78 ml/m LA Biplane Vol: 51.0 ml 25.09 ml/m  SHUNTS Systemic Diam: 2.00 cm  Arvilla Meres MD Electronically signed by Arvilla Meres MD Signature Date/Time: 04/12/2023/9:22:03 AM    Final  TEE  ECHO TEE 04/15/2023  Narrative TRANSESOPHOGEAL ECHO REPORT    Patient Name:   Eddie Graham Date of Exam: 04/15/2023 Medical Rec #:  [id number]                 Height:       66.0 in Accession #:    [id number]                Weight:       210.4 lb Date of Birth:  09/04/1934                 BSA:          2.043 m Patient Age:    88 years                  BP:           98/80 mmHg Patient Gender: M                         HR:           104 bpm. Exam Location:  Inpatient  Procedure: Transesophageal Echo, Cardiac Doppler and Color  Doppler  Indications:     Arrhythmia  History:         Patient has prior history of Echocardiogram examinations, most recent 04/12/2023. Abnormal ECG, COPD; Risk Factors:Hypertension and Diabetes.  Sonographer:     Darlys Gales Referring Phys:  [id number] Veverly Fells SKAINS Diagnosing Phys: [name] MD  PROCEDURE: After discussion of the risks and benefits of a TEE, an informed consent was obtained from the patient. The transesophogeal probe was passed without difficulty through the esophogus of the patient. Sedation performed by different physician. The patient was monitored while under deep sedation. Anesthestetic sedation was provided intravenously by Anesthesiology: 120mg  of Propofol, 60mg  of Lidocaine. The patient's vital  signs; including heart rate, blood pressure, and oxygen saturation; remained stable throughout the procedure. The patient developed no complications during the procedure. A successful direct current cardioversion was performed at 200 joules with 1 attempt.  IMPRESSIONS   1. Left ventricular ejection fraction, by estimation, is 25 to 30%. The left ventricle has severely decreased function. The left ventricle demonstrates global hypokinesis. 2. Right ventricular systolic function is normal. The right ventricular size is normal. 3. No left atrial/left atrial appendage thrombus was detected. 4. The mitral valve is normal in structure. Mild mitral valve regurgitation. No evidence of mitral stenosis. 5. Tricuspid valve regurgitation is moderate. 6. The aortic valve is tricuspid. Aortic valve regurgitation is mild. No aortic stenosis is present. 7. The inferior vena cava is normal in size with greater than 50% respiratory variability, suggesting right atrial pressure of 3 mmHg.  Conclusion(s)/Recommendation(s): No LA/LAA thrombus identified. Successful cardioversion performed with restoration of normal sinus rhythm.  FINDINGS Left Ventricle: Left ventricular ejection fraction, by estimation, is 25 to 30%. The left ventricle has severely decreased function. The left ventricle demonstrates global hypokinesis. The left ventricular internal cavity size was normal in size. There is no left ventricular hypertrophy.  Right Ventricle: The right ventricular size is normal. No increase in right ventricular wall thickness. Right ventricular systolic function is normal.  Left Atrium: Left atrial size was normal in size. No left atrial/left atrial appendage thrombus was detected.  Right Atrium: Right atrial size was normal in size.  Pericardium: There is no evidence of pericardial effusion.  Mitral Valve: The mitral valve is normal in structure. Mild mitral valve regurgitation. No evidence of mitral valve  stenosis.  Tricuspid Valve: The tricuspid valve is normal in structure. Tricuspid valve regurgitation is moderate . No evidence of tricuspid stenosis.  Aortic Valve: The aortic valve is tricuspid. Aortic valve regurgitation is mild. No aortic stenosis is present.  Pulmonic Valve: The pulmonic valve was normal in structure. Pulmonic valve regurgitation is not visualized. No evidence of pulmonic stenosis.  Aorta: The aortic root is normal in size and structure.  Venous: The inferior vena cava is normal in size with greater than 50% respiratory variability, suggesting right atrial pressure of 3 mmHg.  IAS/Shunts: No atrial level shunt detected by color flow Doppler.  [name] MD Electronically signed by [name] MD Signature Date/Time: 04/15/2023/5:04:46 PM    Final            Risk Assessment/Calculations:    CHA2DS2-VASc Score = 6   This indicates a 9.7% annual risk of stroke. The patient's score is based upon: CHF History: 1 HTN History: 1 Diabetes History: 1 Stroke History: 0 Vascular Disease History: 1 Age Score: 2 Gender Score: 0            Physical Exam:   VS:  BP 116/82 (BP Location: Left Arm, Patient Position: Sitting, Cuff Size: Normal)   Pulse 75   Ht 5\' 6"  (1.676 m)   Wt 206 lb 6.4 oz (93.6 kg)   SpO2 97%   BMI 33.31 kg/m    Wt Readings from Last 3 Encounters:  05/13/23 206 lb 6.4 oz (93.6 kg)  05/03/23 207 lb 3.7 oz (94 kg)  04/17/23 210 lb 5.1 oz (95.4 kg)    GEN: Well nourished, well developed in no acute distress NECK: No JVD; No carotid bruits CARDIAC: RRR, no murmurs, rubs, gallops RESPIRATORY:  Clear to auscultation without rales, wheezing or rhonchi  ABDOMEN: Soft, non-tender, non-distended EXTREMITIES:  No edema; No deformity   ASSESSMENT AND PLAN: .     Tachycardia-induced cardiomyopathy EF dropped to 25-30% with global hypokinesis and moderately reduced RV systolic function. Patient has been in sinus rhythm during recent  hospitalizations. Suspected medication noncompliance and worsening self-care at home. -Continue Eliquis for atrial flutter. -Order BMP today.  Plan to reassess patient in 6 weeks, if still holding sinus rhythm, plan to repeat echocardiogram in 3 months.  Recurrent chest pain Non-specific chest pain, not consistent with ACS. Patient has been complaining of chest pain during recent visits. -Scheduled PET stress test on 06/02/2023 to rule out cardiac etiology of chest pain. -Advise patient to avoid caffeine for 12 hours prior to the stress test and to halve the dose of insulin the night before the test.  Chronic Kidney Disease (CKD) stage 3B Monitor renal function in the context of diuretic therapy. -Order BMP today to assess renal function.  Hypertension -Advise daily weight monitoring. Check availability of free scales for patient.  Atrial flutter: Recently underwent TEE DCCV.  Based on physical exam, he is holding sinus rhythm.  Plan to repeat EKG on the next visit.     Informed Consent   Shared Decision Making/Informed Consent The risks [chest pain, shortness of breath, cardiac arrhythmias, dizziness, blood pressure fluctuations, myocardial infarction, stroke/transient ischemic attack, nausea, vomiting, allergic reaction, radiation exposure, metallic taste sensation and life-threatening complications (estimated to be 1 in 10,000)], benefits (risk stratification, diagnosing coronary artery disease, treatment guidance) and alternatives of a cardiac PET stress test were discussed in detail with Mr. Shampine and he agrees to proceed.     Dispo: Follow-up with me in 6 weeks, follow-up with Dr. [name] in 4 months.  Signed, Azalee Course, PA

## 2023-05-14 LAB — BASIC METABOLIC PANEL
BUN/Creatinine Ratio: 10 (ref 10–24)
BUN: 16 mg/dL (ref 8–27)
CO2: 23 mmol/L (ref 20–29)
Calcium: 9.3 mg/dL (ref 8.6–10.2)
Chloride: 96 mmol/L (ref 96–106)
Creatinine, Ser: 1.56 mg/dL — ABNORMAL HIGH (ref 0.76–1.27)
Glucose: 247 mg/dL — ABNORMAL HIGH (ref 70–99)
Potassium: 5.2 mmol/L (ref 3.5–5.2)
Sodium: 136 mmol/L (ref 134–144)
eGFR: 43 mL/min/{1.73_m2} — ABNORMAL LOW (ref >59)

## 2023-05-14 NOTE — Progress Notes (Signed)
Stable renal function and electrolyte

## 2023-06-01 ENCOUNTER — Telehealth (HOSPITAL_COMMUNITY): Payer: Self-pay | Admitting: *Deleted

## 2023-06-01 NOTE — Telephone Encounter (Signed)
 Returning patient's daughter's call to offer assistance regarding upcoming cardiac imaging study; pt's daugher verbalizes understanding of appt date/time, parking situation and where to check in, pre-test NPO status, and verified current allergies; name and call back number provided for further questions should they arise  Chantal Requena RN Navigator Cardiac Imaging [facility] [phone] office [phone] cell.  Patient's daughter aware that patient is to avoid caffeine 12 hours prior to his cardiac PET scan.

## 2023-06-01 NOTE — Telephone Encounter (Signed)
 Attempted to call patient (Via Spanish interpreter; Roxanna ID [phone]) regarding upcoming cardiac PET appointment. Left message on voicemail with name and callback number  Chantal Requena RN Navigator Cardiac Imaging [facility] [phone] Office [phone] Cell

## 2023-06-02 ENCOUNTER — Ambulatory Visit (HOSPITAL_COMMUNITY)
Admission: RE | Admit: 2023-06-02 | Discharge: 2023-06-02 | Disposition: A | Discharge status: Home / Self Care | Payer: 59 | Source: Ambulatory Visit | Attending: Physician Assistant | Admitting: Physician Assistant

## 2023-06-02 DIAGNOSIS — I700 Atherosclerosis of aorta: Secondary | ICD-10-CM | POA: Insufficient documentation

## 2023-06-02 DIAGNOSIS — R079 Chest pain, unspecified: Secondary | ICD-10-CM | POA: Diagnosis present

## 2023-06-02 DIAGNOSIS — I2510 Atherosclerotic heart disease of native coronary artery without angina pectoris: Secondary | ICD-10-CM | POA: Diagnosis not present

## 2023-06-02 DIAGNOSIS — J432 Centrilobular emphysema: Secondary | ICD-10-CM | POA: Insufficient documentation

## 2023-06-02 LAB — NM PET CT CARDIAC PERFUSION MULTI W/ABSOLUTE BLOODFLOW
LV dias vol: 106 mL (ref 62–150)
LV sys vol: 65 mL
MBFR: 0.72
Nuc Rest EF: 39 %
Nuc Stress EF: 42 %
Rest MBF: 1.42 ml/g/min
Rest Nuclear Isotope Dose: 24.1 mCi
ST Depression (mm): 0 mm
Stress MBF: 1.02 ml/g/min
Stress Nuclear Isotope Dose: 24.1 mCi

## 2023-06-02 MED ORDER — RUBIDIUM RB82 GENERATOR (RUBYFILL)
24.1500 | PACK | Freq: Once | INTRAVENOUS | Status: AC
  Administered 2023-06-02: 24.15 via INTRAVENOUS

## 2023-06-02 MED ORDER — RUBIDIUM RB82 GENERATOR (RUBYFILL)
24.0500 | PACK | Freq: Once | INTRAVENOUS | Status: AC
  Administered 2023-06-02: 24.05 via INTRAVENOUS

## 2023-06-02 MED ORDER — REGADENOSON 0.4 MG/5ML IV SOLN
0.4000 mg | Freq: Once | INTRAVENOUS | Status: AC
  Administered 2023-06-02: 0.4 mg via INTRAVENOUS

## 2023-06-02 MED ORDER — REGADENOSON 0.4 MG/5ML IV SOLN
INTRAVENOUS | Status: AC
  Filled 2023-06-02: qty 5

## 2023-06-24 ENCOUNTER — Encounter: Payer: Self-pay | Admitting: Physician Assistant

## 2023-06-24 ENCOUNTER — Ambulatory Visit: Payer: 59 | Attending: Physician Assistant | Admitting: Physician Assistant

## 2023-06-24 VITALS — BP 114/58 | HR 64 | Ht 66.0 in | Wt 203.0 lb

## 2023-06-24 DIAGNOSIS — N183 Chronic kidney disease, stage 3 unspecified: Secondary | ICD-10-CM

## 2023-06-24 DIAGNOSIS — I5022 Chronic systolic (congestive) heart failure: Secondary | ICD-10-CM

## 2023-06-24 DIAGNOSIS — I7143 Infrarenal abdominal aortic aneurysm, without rupture: Secondary | ICD-10-CM

## 2023-06-24 DIAGNOSIS — J449 Chronic obstructive pulmonary disease, unspecified: Secondary | ICD-10-CM

## 2023-06-24 DIAGNOSIS — I25709 Atherosclerosis of coronary artery bypass graft(s), unspecified, with unspecified angina pectoris: Secondary | ICD-10-CM | POA: Diagnosis not present

## 2023-06-24 DIAGNOSIS — I4892 Unspecified atrial flutter: Secondary | ICD-10-CM

## 2023-06-24 MED ORDER — ISOSORBIDE MONONITRATE ER 30 MG PO TB24: 15.0000 mg | ORAL_TABLET | Freq: Every day | ORAL | 3 refills | Status: DC

## 2023-06-24 NOTE — Patient Instructions (Addendum)
 Medication Instructions:  START IMDUR 15 MG DAILY *If you need a refill on your cardiac medications before your next appointment, please call your pharmacy*   Lab Work: NO LABS If you have labs (blood work) drawn today and your tests are completely normal, you will receive your results only by: MyChart Message (if you have MyChart) OR A paper copy in the mail If you have any lab test that is abnormal or we need to change your treatment, we will call you to review the results.   Testing/Procedures:[address] - IN 2 MONTHS Your physician has requested that you have an echocardiogram. Echocardiography is a painless test that uses sound waves to create images of your heart. It provides your doctor with information about the size and shape of your heart and how well your heart's chambers and valves are working. This procedure takes approximately one hour. There are no restrictions for this procedure. Please do NOT wear cologne, perfume, aftershave, or lotions (deodorant is allowed). Please arrive 15 minutes prior to your appointment time.  Please note: We ask at that you not bring children with you during ultrasound (echo/ vascular) testing. Due to room size and safety concerns, children are not allowed in the ultrasound rooms during exams. Our front office staff cannot provide observation of children in our lobby area while testing is being conducted. An adult accompanying a patient to their appointment will only be allowed in the ultrasound room at the discretion of the ultrasound technician under special circumstances. We apologize for any inconvenience.    Follow-Up: At [facility], you and your health needs are our priority.  As part of our continuing mission to provide you with exceptional heart care, we have created designated Provider Care Teams.  These Care Teams include your primary Cardiologist (physician) and Advanced Practice Providers (APPs -  Physician  Assistants and Nurse Practitioners) who all work together to provide you with the care you need, when you need it.  We recommend signing up for the patient portal called "MyChart".  Sign up information is provided on this After Visit Summary.  MyChart is used to connect with patients for Virtual Visits (Telemedicine).  Patients are able to view lab/test results, encounter notes, upcoming appointments, etc.  Non-urgent messages can be sent to your provider as well.   To learn more about what you can do with MyChart, go to [email or web address].    Your next appointment:   KEEP FOLLOW UP Sep 16 2023  Provider:   Rollene Rotunda, MD

## 2023-06-24 NOTE — Progress Notes (Unsigned)
 Cardiology Office Note:  .   Date:  06/25/2023  ID:  Eddie Graham, DOB 08-26-1934, MRN [id number] PCP: Eddie Graham., MD  [facility] Providers Cardiologist:  Eddie Rotunda, MD     History of Present Illness: .   Eddie Graham is a 89 y.o. male with PMH of CAD s/p CABG 2011, atrial flutter, chronic diverticulitis, urethral stricture, high-grade bladder cancer, anemia, AAA, COPD, CKD, hypertension and hyperlipidemia. He moved to [place] from [place] in 2017.  He has been followed by [facility] urology service for history of bladder cancer. He was diagnosed with bladder cancer in 2019 and underwent TURBT at that time.  He had gross hematuria and underwent a urethral balloon dilatation and TURBT in June 2023.  He also underwent a cystoscopy with urethral dilatation in August 2023.  He had resection of the perforated intramural sigmoid colonic abscess in January 2024 with end colostomy.  He was admitted at [facility] in February 2024 with acute GI bleed.  GI was consulted and did not feel patient required colonoscopy and suspected bleeding was either postop bleed versus diverticulitis.  His bleeding stopped during the hospitalization.  He was seen by urology service in September 2024 to schedule surveillance cystoscopy.  He had microscopic hematuria at the time.  Urology service was unable to transversed the scope through a urethral stricture.  He was scheduled to undergo diagnostic cystoscopy with possible TURP and urethral dilatation on 10/1.     When he presented for his procedure, he was found to be tachycardic with heart rate in the 130s.  EKG showed he was in atrial flutter with variable ventricular rate, underlying right bundle branch block.  He was placed on IV metoprolol which later transition to metoprolol succinate.  Echocardiogram obtained on 01/27/2023 showed EF 40 to 45%, global hypokinesis, severe hypokinesis of the LV, basal mid inferoseptal  wall, inferior wall, and inferior lateral wall, mildly reduced RV systolic function, mild biatrial enlargement, mild dilated aortic root measuring at 38 mm.  Patient had some chest tightness during the hospitalization, however it was worse with deep inspiration and reproducible with palpation on exam.  He was started on Eliquis.  He was not placed on ARB or ARNI due to CKD.  He underwent cystoscopy with urethral dilatation by Dr. Arita Graham on 02/09/2023.  I previously recommended a PET stress test after the procedure given intermittent chest discomfort, however this was never done.  He was readmitted to the hospital in December 2024 with chest pain.  Serial troponin borderline flat not consistent with ACS.  Heart rate was elevated.  Repeat echocardiogram obtained on 04/12/2023 demonstrated EF has dropped to 25 to 30%, global hypokinesis, moderately reduced RV systolic function, mild biatrial enlargement.  It was suspected that this is primarily tachycardia mediated cardiomyopathy.  Patient ultimately underwent TEE DCCV on 04/15/2023.    Patient has since been readmitted to the hospital on 04/22/2023 with shortness of breath.  He was placed on 4 L nasal cannula.  BNP elevated at 840.  He was treated with IV Lasix and diuresed 16 L.  Creatinine peaked at 2.02 however stabilized at 1.5-1.8 at the time of discharge.  It was suspected medication noncompliance and worsening self-care at home.  It was discussed with family by phone and recommended additional support while moving into a long-term care.  Attempt was made to discharge patient to acute rehab however insurance denied it.  He remained in sinus rhythm during the hospitalization.  During the recent  hospitalization, Lasix was increased from 20 mg daily to 40 mg daily.  He ultimately underwent PET stress test in February 2025, this showed EF 39%, medium defect of moderate reduction in uptake present in the basal lateral location that is fixed, there is also a medium  defect of mild reduction in uptake present in the apical and mid anterior location that is reversible, severe coronary calcification present in the LAD, left circumflex and RCA, study consistent with ischemia and infarction.  Noncardiac portion consistent with central lobar emphysema.   Patient presents today for follow-up.  He continued to have daily episode of chest discomfort, however his chest discomfort improved after he put on his oxygen.  He says the chest discomfort is very mild and that he has been having this symptom for a long time now.  I reviewed the PET stress test with the patient using [employer] [facility] Eddie Graham [phone]).  There is a area of scar but there is also a separate area of ischemia.  I reviewed the study with DOD Dr. Bjorn Graham.  Patient's functional ability is very limited, his oxygen dependent 24/7 on 3 L.  He has multiple other comorbidities.  Even with a Engineer, structural, communication has been very difficult.  He is not a great candidate for cardiac catheterization.  We recommend start with medication therapy first.  I will start him on 15 mg daily of Imdur.  I plan for repeat limited echocardiogram in 2 months to reassess the ejection fraction to make sure EF improves.  Otherwise, he can follow-up with Dr. Antoine Graham in 2 to 3 months.  ROS:   Patient complains of intermittent chest pain, shortness of breath, he has no lower extremity edema, orthopnea or PND.  Studies Reviewed: Marland Kitchen   EKG Interpretation Date/Time:  Thursday June 24 2023 10:11:47 EST Ventricular Rate:  64 PR Interval:  186 QRS Duration:  142 QT Interval:  474 QTC Calculation: 489 R Axis:   -54  Text Interpretation: Normal sinus rhythm Right bundle branch block  T wave inversion in the inferior leads Confirmed by Azalee Course [phone]) on 06/25/2023 8:15:57 PM    Cardiac Studies & Procedures   ______________________________________________________________________________________________   STRESS  TESTS  NM PET CT CARDIAC PERFUSION MULTI W/ABSOLUTE BLOODFLOW 06/02/2023  Narrative   LV perfusion is abnormal. There is evidence of ischemia. There is evidence of infarction. Defect 1: There is a medium defect with moderate reduction in uptake present in the basal lateral location(s) that is fixed. There is abnormal wall motion in the defect area. Consistent with infarction and ischemia. Defect 2: There is a medium defect with mild reduction in uptake present in the apical to mid anterior location(s) that is reversible.   Rest left ventricular function is abnormal. Rest global function is moderately reduced. There was a single regional abnormality. Rest EF: 39%. Stress left ventricular function is abnormal. Stress global function is moderately reduced. There was a single regional abnormality. Stress EF: 42%. End diastolic cavity size is mildly enlarged. End systolic cavity size is mildly enlarged.   Myocardial blood flow was computed to be 1.67ml/g/min at rest and 1.49ml/g/min at stress. Global myocardial blood flow reserve was 0.72 and was highly abnormal.   Coronary calcium was present on the attenuation correction CT images. Severe coronary calcifications were present. Coronary calcifications were present in the left anterior descending artery, left circumflex artery and right coronary artery distribution(s).   Findings are consistent with ischemia and infarction. The study is intermediate risk.   Moderaely  reduced LV function with severe 3 vessel calcium, abnormal perfusion suggesting multivessel disease and abnormal MBFR  CLINICAL DATA:  This over-read does not include interpretation of cardiac or coronary anatomy or pathology. The Cardiac PET CT interpretation by the cardiologist is attached.  COMPARISON:  None Available.  FINDINGS: Cardiovascular: Aortic atherosclerosis. Graft repair of the tubular ascending thoracic aorta. Mild cardiomegaly. Three-vessel coronary artery calcifications. No  pericardial effusion.  Limited Mediastinum/Nodes: No enlarged mediastinal, hilar, or axillary lymph nodes. Trachea and esophagus demonstrate no significant findings.  Limited Lungs/Pleura: Mild centrilobular emphysema. Bibasilar scarring or atelectasis. No pleural effusion or pneumothorax.  Upper Abdomen: No acute abnormality.  Musculoskeletal: No chest wall abnormality. No acute osseous findings.  IMPRESSION: 1. No acute CT findings of the included chest. 2. Mild centrilobular emphysema. 3. Coronary artery disease.  Aortic Atherosclerosis (ICD10-I70.0) and Emphysema (ICD10-J43.9).   Electronically Signed By: Jearld Lesch M.D. On: 06/02/2023 11:07   ECHOCARDIOGRAM  ECHOCARDIOGRAM LIMITED 04/12/2023  Narrative ECHOCARDIOGRAM LIMITED REPORT    Patient Name:   BRYDAN DOWNARD Date of Exam: 04/12/2023 Medical Rec #:  [id number]                 Height:       66.0 in Accession #:    [id number]                Weight:       207.9 lb Date of Birth:  12/31/1934                 BSA:          2.033 m Patient Age:    88 years                  BP:           148/90 mmHg Patient Gender: M                         HR:           145 bpm. Exam Location:  Inpatient  Procedure: Limited Echo and Intracardiac Opacification Agent  Indications:    CHF-Acute Systolic I50.21  History:        Patient has prior history of Echocardiogram examinations, most recent 01/27/2023. Prior CABG, COPD; Risk Factors:Hypertension and Diabetes.  Sonographer:    Darlys Gales Referring Phys: [id number] CAROLE N HALL  IMPRESSIONS   1. Left ventricular ejection fraction, by estimation, is 25 to 30%. The left ventricle has severely decreased function. The left ventricle demonstrates global hypokinesis. 2. Right ventricular systolic function is moderately reduced. The right ventricular size is normal. 3. Left atrial size was mildly dilated. 4. Right atrial size was mildly dilated. 5. The mitral  valve is normal in structure. Color Doppler not performed 6. The aortic valve is tricuspid. There is mild calcification of the aortic valve. Color Doppler not performed 7. The inferior vena cava is dilated in size with <50% respiratory variability, suggesting right atrial pressure of 15 mmHg.  FINDINGS Left Ventricle: Left ventricular ejection fraction, by estimation, is 25 to 30%. The left ventricle has severely decreased function. The left ventricle demonstrates global hypokinesis. The left ventricular internal cavity size was normal in size. There is no left ventricular hypertrophy.  Right Ventricle: The right ventricular size is normal. No increase in right ventricular wall thickness. Right ventricular systolic function is moderately reduced.  Left Atrium: Left atrial size was mildly dilated.  Right Atrium: Right atrial size  was mildly dilated.  Pericardium: There is no evidence of pericardial effusion.  Mitral Valve: The mitral valve was not well visualized.  Tricuspid Valve: The tricuspid valve is normal in structure. Tricuspid valve regurgitation is not demonstrated. No evidence of tricuspid stenosis.  Aortic Valve: The aortic valve is tricuspid. There is mild calcification of the aortic valve.  Pulmonic Valve: The pulmonic valve was not well visualized. Pulmonic valve regurgitation is not visualized. No evidence of pulmonic stenosis.  Aorta: Aortic root could not be assessed.  Venous: The inferior vena cava is dilated in size with less than 50% respiratory variability, suggesting right atrial pressure of 15 mmHg.  IAS/Shunts: No atrial level shunt detected by color flow Doppler.  LEFT VENTRICLE PLAX 2D LVIDd:         4.50 cm LVIDs:         4.00 cm LV PW:         0.90 cm LV IVS:        1.00 cm LVOT diam:     2.00 cm LVOT Area:     3.14 cm   IVC IVC diam: 2.30 cm  LEFT ATRIUM             Index        RIGHT ATRIUM           Index LA Vol (A2C):   68.9 ml 33.89 ml/m   RA Area:     20.50 cm LA Vol (A4C):   36.0 ml 17.71 ml/m  RA Volume:   58.50 ml  28.78 ml/m LA Biplane Vol: 51.0 ml 25.09 ml/m  SHUNTS Systemic Diam: 2.00 cm  Arvilla Meres MD Electronically signed by Arvilla Meres MD Signature Date/Time: 04/12/2023/9:22:03 AM    Final   TEE  ECHO TEE 04/15/2023  Narrative TRANSESOPHOGEAL ECHO REPORT    Patient Name:   LEIGHTON BRICKLEY Date of Exam: 04/15/2023 Medical Rec #:  [id number]                 Height:       66.0 in Accession #:    [id number]                Weight:       210.4 lb Date of Birth:  Mar 18, 1935                 BSA:          2.043 m Patient Age:    88 years                  BP:           98/80 mmHg Patient Gender: M                         HR:           104 bpm. Exam Location:  Inpatient  Procedure: Transesophageal Echo, Cardiac Doppler and Color Doppler  Indications:     Arrhythmia  History:         Patient has prior history of Echocardiogram examinations, most recent 04/12/2023. Abnormal ECG, COPD; Risk Factors:Hypertension and Diabetes.  Sonographer:     Darlys Gales Referring Phys:  [id number] V[address] Fells SKAINS Diagnosing Phys: [name] MD  PROCEDURE: After discussion of the risks and benefits of a TEE, an informed consent was obtained from the patient. The transesophogeal probe was passed without difficulty through the esophogus of the patient. Sedation performed by different physician.  The patient was monitored while under deep sedation. Anesthestetic sedation was provided intravenously by Anesthesiology: 120mg  of Propofol, 60mg  of Lidocaine. The patient's vital signs; including heart rate, blood pressure, and oxygen saturation; remained stable throughout the procedure. The patient developed no complications during the procedure. A successful direct current cardioversion was performed at 200 joules with 1 attempt.  IMPRESSIONS   1. Left ventricular ejection fraction, by estimation, is 25 to 30%.  The left ventricle has severely decreased function. The left ventricle demonstrates global hypokinesis. 2. Right ventricular systolic function is normal. The right ventricular size is normal. 3. No left atrial/left atrial appendage thrombus was detected. 4. The mitral valve is normal in structure. Mild mitral valve regurgitation. No evidence of mitral stenosis. 5. Tricuspid valve regurgitation is moderate. 6. The aortic valve is tricuspid. Aortic valve regurgitation is mild. No aortic stenosis is present. 7. The inferior vena cava is normal in size with greater than 50% respiratory variability, suggesting right atrial pressure of 3 mmHg.  Conclusion(s)/Recommendation(s): No LA/LAA thrombus identified. Successful cardioversion performed with restoration of normal sinus rhythm.  FINDINGS Left Ventricle: Left ventricular ejection fraction, by estimation, is 25 to 30%. The left ventricle has severely decreased function. The left ventricle demonstrates global hypokinesis. The left ventricular internal cavity size was normal in size. There is no left ventricular hypertrophy.  Right Ventricle: The right ventricular size is normal. No increase in right ventricular wall thickness. Right ventricular systolic function is normal.  Left Atrium: Left atrial size was normal in size. No left atrial/left atrial appendage thrombus was detected.  Right Atrium: Right atrial size was normal in size.  Pericardium: There is no evidence of pericardial effusion.  Mitral Valve: The mitral valve is normal in structure. Mild mitral valve regurgitation. No evidence of mitral valve stenosis.  Tricuspid Valve: The tricuspid valve is normal in structure. Tricuspid valve regurgitation is moderate . No evidence of tricuspid stenosis.  Aortic Valve: The aortic valve is tricuspid. Aortic valve regurgitation is mild. No aortic stenosis is present.  Pulmonic Valve: The pulmonic valve was normal in structure. Pulmonic valve  regurgitation is not visualized. No evidence of pulmonic stenosis.  Aorta: The aortic root is normal in size and structure.  Venous: The inferior vena cava is normal in size with greater than 50% respiratory variability, suggesting right atrial pressure of 3 mmHg.  IAS/Shunts: No atrial level shunt detected by color flow Doppler.  [name] MD Electronically signed by [name] MD Signature Date/Time: 04/15/2023/5:04:46 PM    Final        ______________________________________________________________________________________________      Risk Assessment/Calculations:    CHA2DS2-VASc Score = 6   This indicates a 9.7% annual risk of stroke. The patient's score is based upon: CHF History: 1 HTN History: 1 Diabetes History: 1 Stroke History: 0 Vascular Disease History: 1 Age Score: 2 Gender Score: 0            Physical Exam:   VS:  BP (!) 114/58 (BP Location: Left Arm, Patient Position: Sitting, Cuff Size: Normal)   Pulse 64   Ht 5\' 6"  (1.676 m)   Wt 203 lb (92.1 kg)   BMI 32.77 kg/m    Wt Readings from Last 3 Encounters:  06/24/23 203 lb (92.1 kg)  05/13/23 206 lb 6.4 oz (93.6 kg)  05/03/23 207 lb 3.7 oz (94 kg)    GEN: Well nourished, well developed in no acute distress NECK: No JVD; No carotid bruits CARDIAC: RRR, no murmurs, rubs, gallops  RESPIRATORY:  Clear to auscultation without rales, wheezing or rhonchi  ABDOMEN: Soft, non-tender, non-distended EXTREMITIES:  No edema; No deformity   ASSESSMENT AND PLAN: .    CAD Abnormal PET stress test with areas of scar and ischemia. Daily mild chest discomfort that improves with oxygen use. Limited functional ability and multiple comorbidities. Communication difficulties even with a Nurse, learning disability. Not a good candidate for cardiac catheterization. -Start Imdur 15mg  daily  -Repeat limited echocardiogram in 2 months to reassess ejection fraction.  Atrial flutter: Underwent TEE DCCV on 04/15/2023, currently  maintaining sinus rhythm based on EKG.  Chronic Systolic Heart Failure Ejection fraction dropped to 25-30% in December 2024. Lasix increased from 20mg  daily to 40mg  daily during hospitalization. -Continue Lasix 40mg  daily. -Repeat limited echocardiogram in 2 months to reassess ejection fraction.  CKD stage III: Stable on last blood work in January  COPD: On home oxygen       Dispo: Follow-up with Dr. Antoine Graham in 2 to 33-month  Signed, Azalee Course, PA

## 2023-07-12 ENCOUNTER — Other Ambulatory Visit (HOSPITAL_COMMUNITY): Payer: Self-pay

## 2023-08-11 ENCOUNTER — Telehealth: Payer: Self-pay | Admitting: Cardiology

## 2023-08-11 NOTE — Telephone Encounter (Signed)
 Daughter is calling needing a portable o2 machine so he can travel. Pt gets his o2 from [employer]  (P) [phone] Please advise.

## 2023-08-11 NOTE — Telephone Encounter (Signed)
 Returned call to Dierdre Foyer, daughter she states that her PCP will "take care of this". She will call back if she needs anything further.

## 2023-08-19 ENCOUNTER — Ambulatory Visit (HOSPITAL_COMMUNITY): Payer: 59

## 2023-09-08 ENCOUNTER — Telehealth: Payer: Self-pay | Admitting: Physician Assistant

## 2023-09-08 NOTE — Telephone Encounter (Signed)
 Lauren, RN from Dr. Cheron Corning office called in to Triage concerned about pt being on both Plavix  and Eliquis . Apparently Daughter had called in to [facility] to ask for Plavix  and Eliquis  Refills.  Per Chart review, pt is only on Eliquis  2.5 mg BID. Plavix  was once prescribed in Feb 2024, but ended October 2024.   Lauren, RN would like a call back to [phone] (Leave detailed voicemail if no answer) so that this can be clarified. Pt has f/u visit with Dr. Lavonne Prairie on 09/16/23.

## 2023-09-08 NOTE — Telephone Encounter (Signed)
 Lauren, RN, clinical coordinator with Dr. Milly Almas office at [facility] called in stating she urgently needs to speak with a nurse about pt possibly being on two blood thinners, Call transferred to triage, Simonne Dubonnet

## 2023-09-09 NOTE — Telephone Encounter (Signed)
 We have not prescribed Eddie Graham any plavix , although he does have a reason to be on antiplatelet as he likely has significant underlying coronary artery disease based on the recent abnormal stress test. If bleeding risk is a concern, can change plavix  to 81mg  daily aspirin  while remain on Eliquis .

## 2023-09-09 NOTE — Telephone Encounter (Signed)
 Patient identification verified by 2 forms. Sims Duck, RN     Called and spoke to patient's daughter Jeani Mill states:  -  patient is taking plavix  and eliquis  - will call the aid that cares for patient and inform them of provider recommendations to stop plavix  and start 81mg  aspirin  in combination with Eliquis . - confirmed she arranged transport for patient for 5/22 OV as she is in [place] .    Sherri Doffing agrees with plan, no questions at this time

## 2023-09-13 ENCOUNTER — Ambulatory Visit (HOSPITAL_COMMUNITY)
Admission: RE | Admit: 2023-09-13 | Discharge: 2023-09-13 | Disposition: A | Discharge status: Home / Self Care | Source: Ambulatory Visit | Attending: Internal Medicine | Admitting: Internal Medicine

## 2023-09-13 DIAGNOSIS — I361 Nonrheumatic tricuspid (valve) insufficiency: Secondary | ICD-10-CM | POA: Diagnosis not present

## 2023-09-13 DIAGNOSIS — I5022 Chronic systolic (congestive) heart failure: Secondary | ICD-10-CM | POA: Diagnosis not present

## 2023-09-13 LAB — ECHOCARDIOGRAM LIMITED
Area-P 1/2: 3.6 cm2
P 1/2 time: 426 ms
S' Lateral: 3.2 cm

## 2023-09-14 ENCOUNTER — Ambulatory Visit: Payer: Self-pay | Admitting: Physician Assistant

## 2023-09-15 NOTE — Progress Notes (Signed)
 Cardiology Office Note:   Date:  09/16/2023  ID:  Eddie Graham, DOB 10-20-1934, MRN [id number] PCP: Eddie Post., MD  [facility] Providers Cardiologist:  Eddie Grates, MD {  History of Present Illness:   Eddie Graham is a 89 y.o. male  with PMH of CAD s/p CABG 2011 and atrial flutter.  Echocardiogram obtained on 01/27/2023 showed EF 40 to 45%, global hypokinesis, severe hypokinesis of the LV, basal mid inferoseptal wall, inferior wall, and inferior lateral wall, mildly reduced RV systolic function, mild biatrial enlargement, mild dilated aortic root measuring at 38 mm.  in December 2024 with chest pain.  Serial troponin borderline flat not consistent with ACS.  Heart rate was elevated.  Repeat echocardiogram obtained on 04/12/2023 demonstrated EF has dropped to 25 to 30%, global hypokinesis, moderately reduced RV systolic function, mild biatrial enlargement.  It was suspected that this is primarily tachycardia mediated cardiomyopathy.  Patient ultimately underwent TEE DCCV on 04/15/2023.  He ultimately underwent PET stress test in February 2025, this showed EF 39%, medium defect of moderate reduction in uptake present in the basal lateral location that is fixed, there is also a medium defect of mild reduction in uptake present in the apical and mid anterior location that is reversible, severe coronary calcification present in the LAD, left circumflex and RCA, study consistent with ischemia and infarction.  Noncardiac portion consistent with central lobar emphysema.  An interpreter was used for this appointment.  Follow up echo in May 2025 demonstrated an EF was up to 55 - 60%.  He gets around in a scooter.  He is on O2 all the time.  The patient denies any new symptoms such as chest discomfort, neck or arm discomfort. There has been no new shortness of breath, PND or orthopnea. There have been no reported palpitations, presyncope or syncope.     ROS: As stated in  the HPI and negative for all other systems.  Studies Reviewed:    EKG:   NA  Risk Assessment/Calculations:    CHA2DS2-VASc Score = 6   This indicates a 9.7% annual risk of stroke. The patient's score is based upon: CHF History: 1 HTN History: 1 Diabetes History: 1 Stroke History: 0 Vascular Disease History: 1 Age Score: 2 Gender Score: 0    Physical Exam:   VS:  BP 118/66   Pulse 76   Ht 5\' 6"  (1.676 m)   Wt 204 lb (92.5 kg)   SpO2 90%   BMI 32.93 kg/m    Wt Readings from Last 3 Encounters:  09/16/23 204 lb (92.5 kg)  06/24/23 203 lb (92.1 kg)  05/13/23 206 lb 6.4 oz (93.6 kg)     GEN: Well nourished, well developed in no acute distress NECK: No JVD; No carotid bruits CARDIAC: RRR, no murmurs, rubs, gallops RESPIRATORY:  Clear to auscultation without rales, wheezing or rhonchi  ABDOMEN: Soft, non-tender, non-distended EXTREMITIES:  No edema; No deformity   ASSESSMENT AND PLAN:    CAD:  The patient has no new sypmtoms.  No further cardiovascular testing is indicated.  We will continue with aggressive risk reduction and meds as listed.  Since he is not having symptoms and would be high risk for cath we are managing medically.  No change in therapy.   Atrial flutter: He is maintaining sinus rhythm.  No change in therapy.   Chronic Systolic Heart Failure: His ejection fraction improved.  No change in therapy.  CKD stage III: His creatinine was unchanged at  1.56.  No change in therapy.   COPD: He is on home O2 chronically.  Follow up with Hao Meng in one year.    Signed, Eddie Grates, MD

## 2023-09-16 ENCOUNTER — Encounter: Payer: Self-pay | Admitting: Cardiology

## 2023-09-16 ENCOUNTER — Ambulatory Visit: Payer: 59 | Attending: Cardiology | Admitting: Cardiology

## 2023-09-16 VITALS — BP 118/66 | HR 76 | Ht 66.0 in | Wt 204.0 lb

## 2023-09-16 DIAGNOSIS — N1831 Chronic kidney disease, stage 3a: Secondary | ICD-10-CM

## 2023-09-16 DIAGNOSIS — I4892 Unspecified atrial flutter: Secondary | ICD-10-CM | POA: Diagnosis not present

## 2023-09-16 DIAGNOSIS — I5022 Chronic systolic (congestive) heart failure: Secondary | ICD-10-CM | POA: Diagnosis not present

## 2023-09-16 DIAGNOSIS — I25708 Atherosclerosis of coronary artery bypass graft(s), unspecified, with other forms of angina pectoris: Secondary | ICD-10-CM | POA: Diagnosis not present

## 2023-09-16 DIAGNOSIS — J449 Chronic obstructive pulmonary disease, unspecified: Secondary | ICD-10-CM

## 2023-09-16 NOTE — Patient Instructions (Signed)
 Medication Instructions:  Your physician recommends that you continue on your current medications as directed. Please refer to the Current Medication list given to you today.  *If you need a refill on your cardiac medications before your next appointment, please call your pharmacy*  Follow-Up: At [facility], you and your health needs are our priority.  As part of our continuing mission to provide you with exceptional heart care, our providers are all part of one team.  This team includes your primary Cardiologist (physician) and Advanced Practice Providers or APPs (Physician Assistants and Nurse Practitioners) who all work together to provide you with the care you need, when you need it.  Your next appointment:   1 year(s)  Provider:   Ervin Heath, PA-C   We recommend signing up for the patient portal called "MyChart".  Sign up information is provided on this After Visit Summary.  MyChart is used to connect with patients for Virtual Visits (Telemedicine).  Patients are able to view lab/test results, encounter notes, upcoming appointments, etc.  Non-urgent messages can be sent to your provider as well.   To learn more about what you can do with MyChart, go to [email or web address].

## 2024-05-02 ENCOUNTER — Other Ambulatory Visit: Payer: Self-pay | Admitting: Physician Assistant
